# Patient Record
Sex: Female | Born: 1960 | Race: White | Hispanic: No | Smoking: Current every day smoker
Health system: Southern US, Community
[De-identification: ages and names within clinical notes are randomized; demographics above are authoritative.]

## PROBLEM LIST (undated history)

## (undated) DIAGNOSIS — J302 Other seasonal allergic rhinitis: Secondary | ICD-10-CM

## (undated) DIAGNOSIS — R112 Nausea with vomiting, unspecified: Secondary | ICD-10-CM

## (undated) DIAGNOSIS — M1990 Unspecified osteoarthritis, unspecified site: Secondary | ICD-10-CM

## (undated) DIAGNOSIS — M329 Systemic lupus erythematosus, unspecified: Secondary | ICD-10-CM

## (undated) DIAGNOSIS — IMO0002 Reserved for concepts with insufficient information to code with codable children: Secondary | ICD-10-CM

## (undated) DIAGNOSIS — K5792 Diverticulitis of intestine, part unspecified, without perforation or abscess without bleeding: Secondary | ICD-10-CM

## (undated) DIAGNOSIS — F410 Panic disorder [episodic paroxysmal anxiety] without agoraphobia: Secondary | ICD-10-CM

## (undated) DIAGNOSIS — K651 Peritoneal abscess: Secondary | ICD-10-CM

## (undated) DIAGNOSIS — Z9889 Other specified postprocedural states: Secondary | ICD-10-CM

## (undated) DIAGNOSIS — T7840XA Allergy, unspecified, initial encounter: Secondary | ICD-10-CM

## (undated) DIAGNOSIS — E785 Hyperlipidemia, unspecified: Secondary | ICD-10-CM

## (undated) DIAGNOSIS — I10 Essential (primary) hypertension: Secondary | ICD-10-CM

## (undated) HISTORY — PX: PATELLA FRACTURE SURGERY: SHX735

## (undated) HISTORY — PX: TRIGGER POINT INJECTION: SHX2580

## (undated) HISTORY — DX: Systemic lupus erythematosus, unspecified: M32.9

## (undated) HISTORY — DX: Unspecified osteoarthritis, unspecified site: M19.90

## (undated) HISTORY — PX: EYE SURGERY: SHX253

## (undated) HISTORY — DX: Hyperlipidemia, unspecified: E78.5

## (undated) HISTORY — DX: Panic disorder (episodic paroxysmal anxiety): F41.0

## (undated) HISTORY — DX: Other seasonal allergic rhinitis: J30.2

## (undated) HISTORY — PX: WRIST FRACTURE SURGERY: SHX121

## (undated) HISTORY — DX: Allergy, unspecified, initial encounter: T78.40XA

## (undated) HISTORY — DX: Reserved for concepts with insufficient information to code with codable children: IMO0002

## (undated) HISTORY — PX: TONSILLECTOMY: SUR1361

---

## 1981-09-18 HISTORY — PX: OTHER SURGICAL HISTORY: SHX169

## 1983-09-19 HISTORY — PX: TUBAL LIGATION: SHX77

## 2005-10-18 ENCOUNTER — Emergency Department (HOSPITAL_COMMUNITY): Admission: EM | Admit: 2005-10-18 | Discharge: 2005-10-18 | Payer: Self-pay | Admitting: Emergency Medicine

## 2010-05-04 ENCOUNTER — Emergency Department (HOSPITAL_COMMUNITY): Admission: EM | Admit: 2010-05-04 | Discharge: 2010-05-04 | Payer: Self-pay | Admitting: Emergency Medicine

## 2010-05-04 IMAGING — CR DG RIBS W/ CHEST 3+V*R*
4 series · 4 of 4 positions shown · non-contrast
Comparison: None

CLINICAL DATA: Fall, right lower lateral rib pain, cough and
congestion

RIGHT RIBS AND CHEST - 3+ VIEW

[w chest pa]
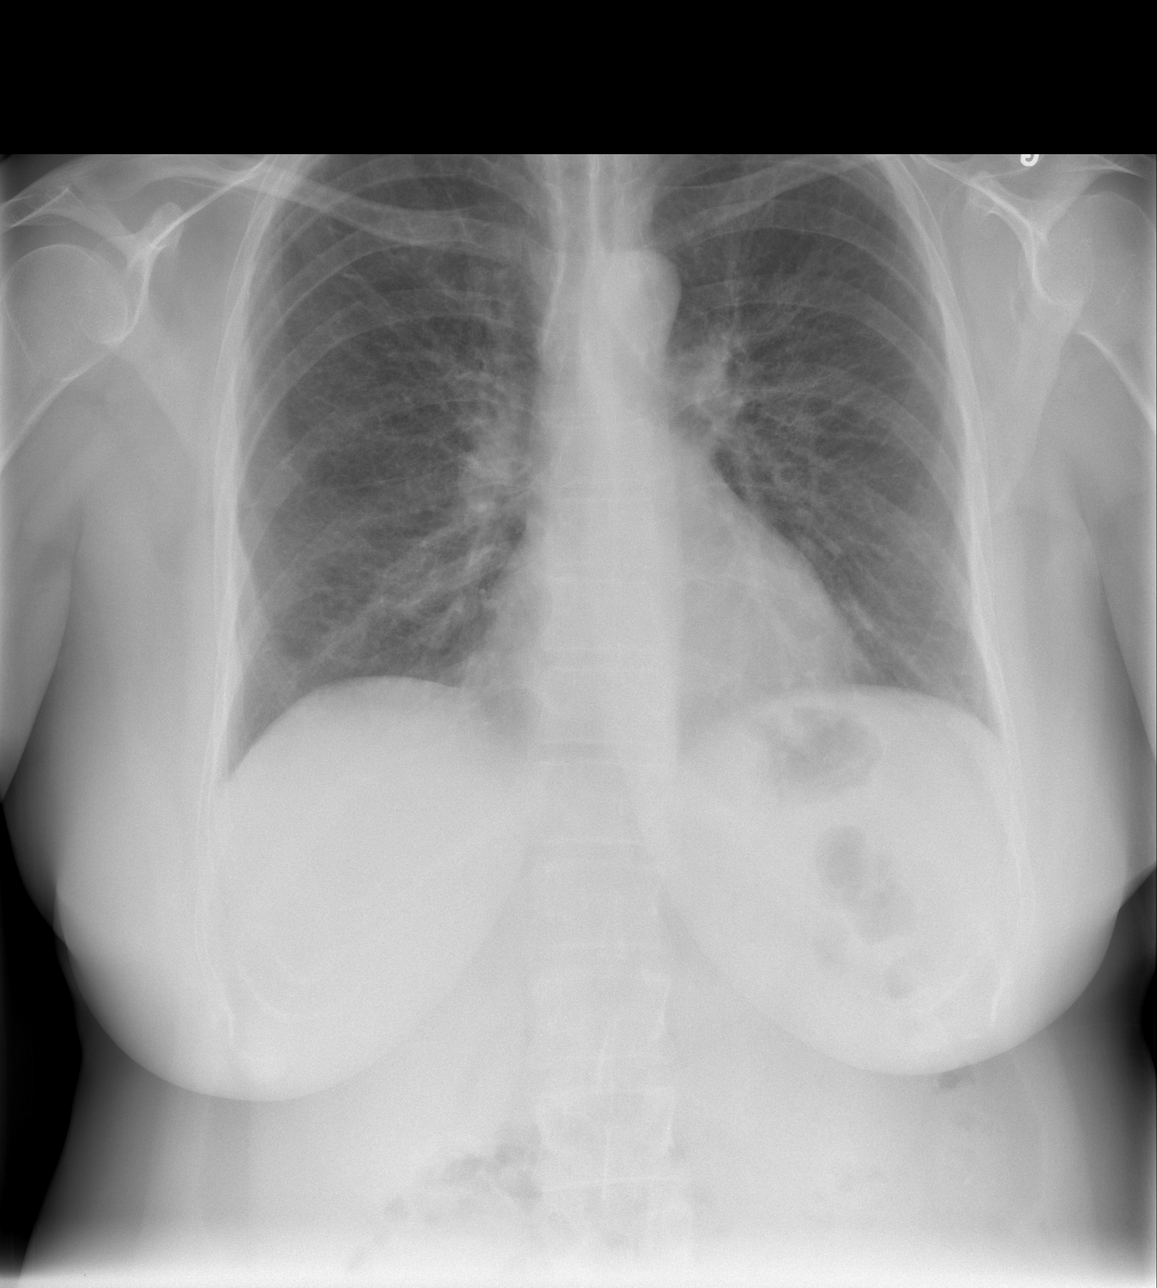

[w ribs ap/pa upper right *]
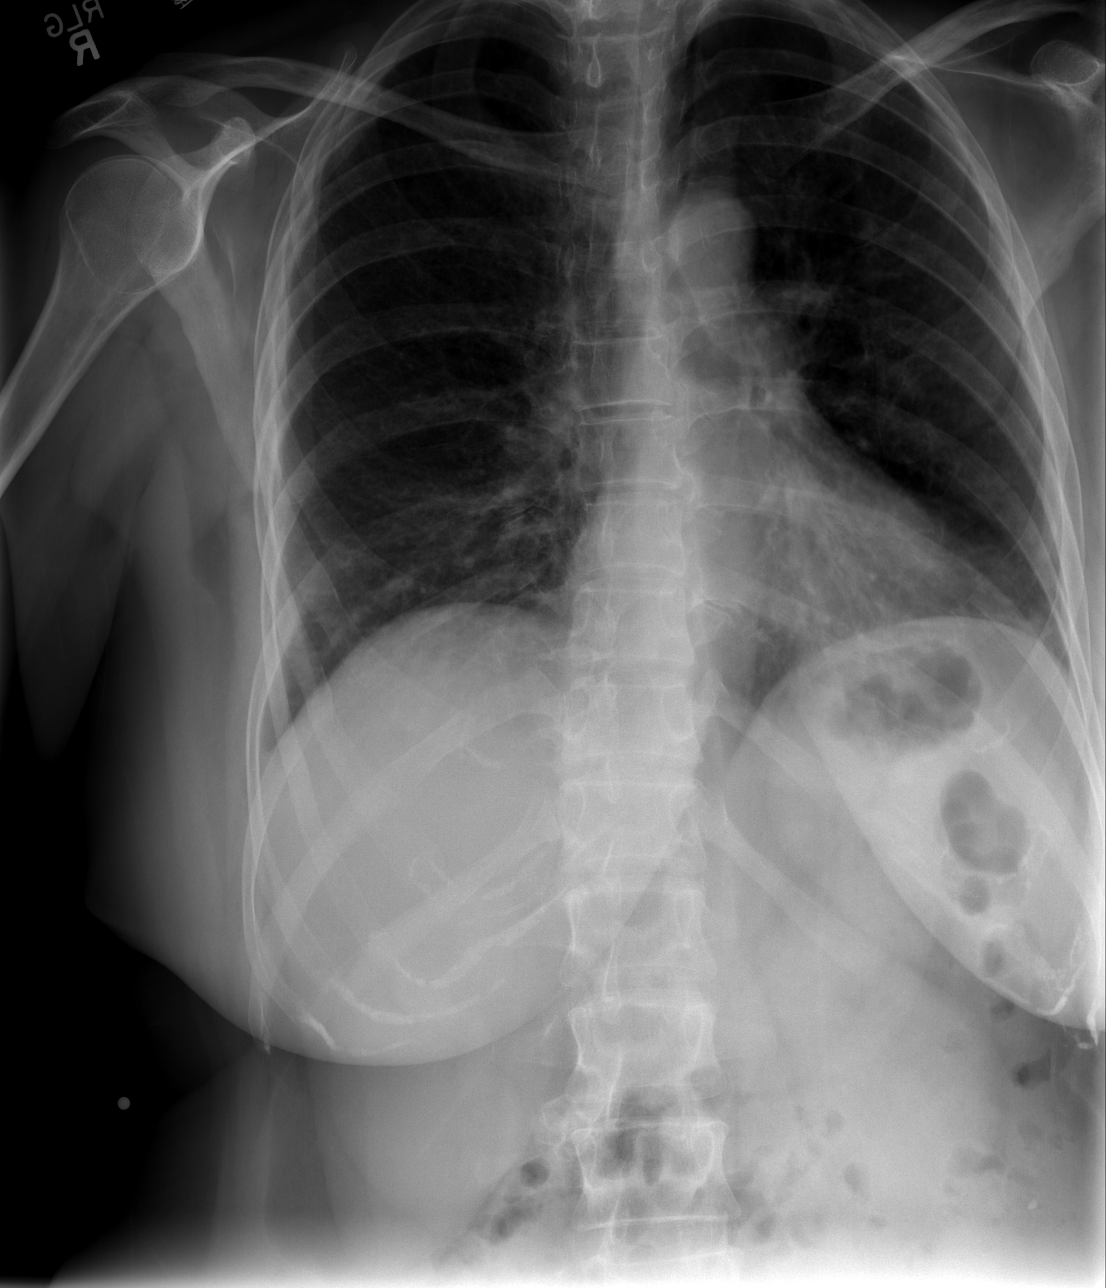

[w ribs ap/pa lower right * (1 of 2)]
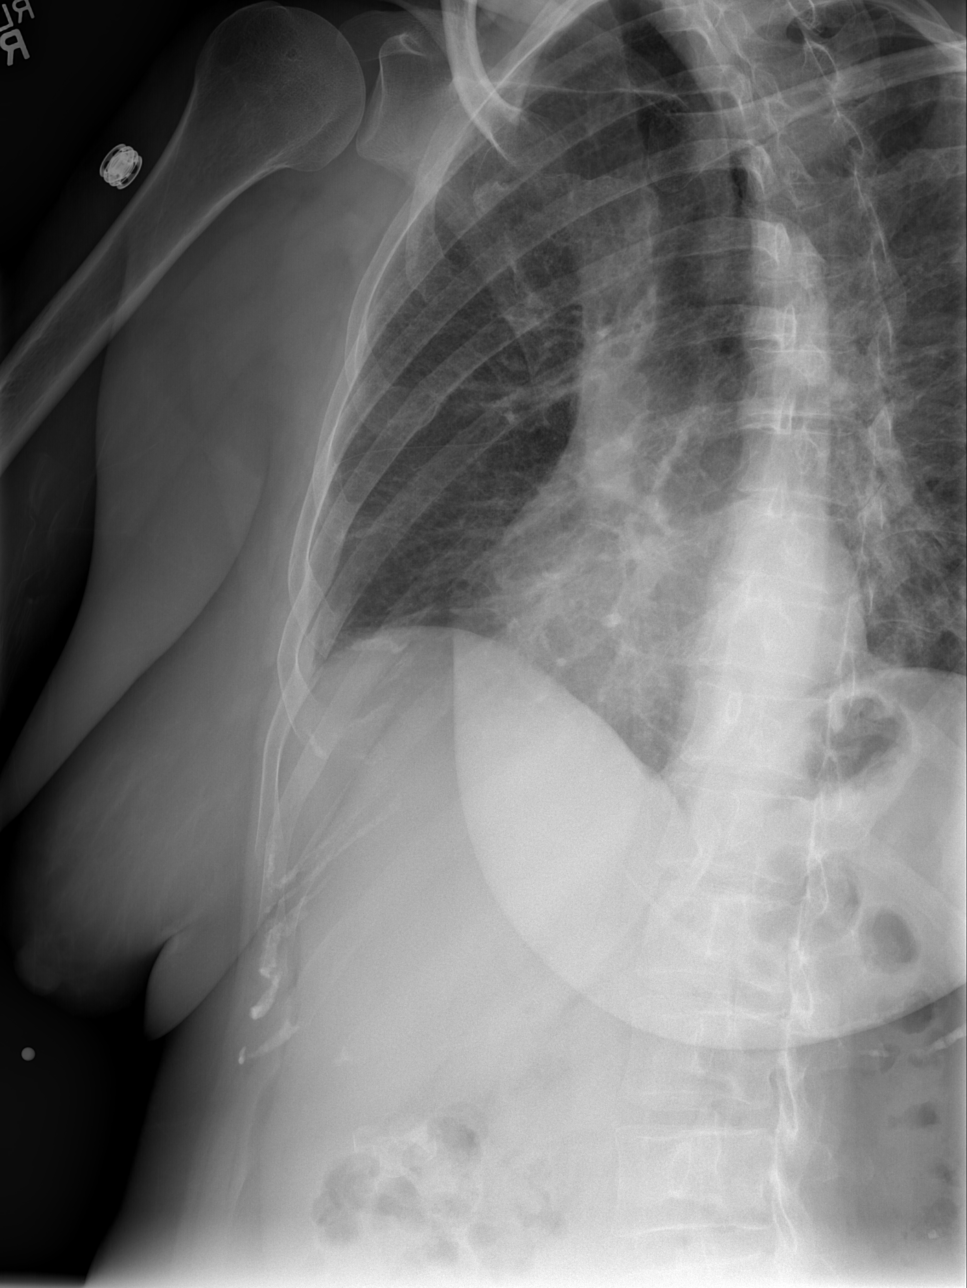

[w ribs ap/pa lower right * (2 of 2)]
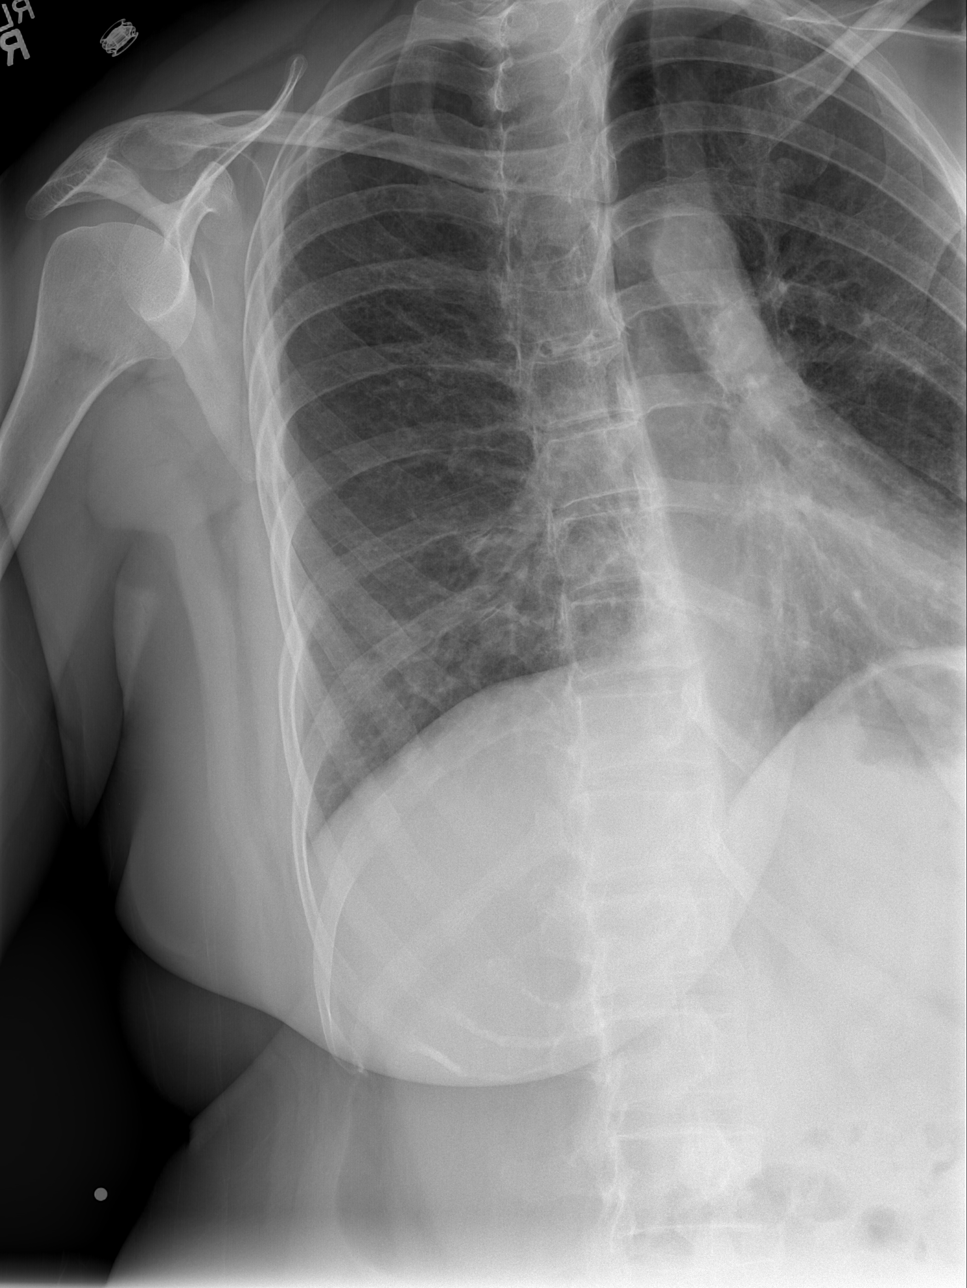

[4 of 4 positions shown; findings below may reference images not displayed]

FINDINGS: Normal heart size, mediastinal contours, and pulmonary vascularity.
Peribronchial thickening with slightly prominent interstitial
markings in mid lungs, uncertain acuity.
Minimal atelectasis right base.
Lungs otherwise clear.
No pleural effusion or pneumothorax.
Diffuse osseous demineralization.
Slight contour abnormality anterolateral right seventh rib, suspect
old fracture.
Old fracture anterolateral right sixth rib.
No definite acute rib fracture identified.
IMPRESSION: Bronchitic changes.
Minimal right basilar atelectasis.
Mild perihilar interstitial changes of uncertain acuity.
Bony demineralization with probably old fractures of anterolateral
right sixth and seventh ribs.
No definite acute abnormalities.

## 2011-08-30 DIAGNOSIS — J309 Allergic rhinitis, unspecified: Secondary | ICD-10-CM | POA: Insufficient documentation

## 2011-12-05 ENCOUNTER — Encounter: Payer: Self-pay | Admitting: Gastroenterology

## 2012-01-02 ENCOUNTER — Encounter: Payer: Self-pay | Admitting: Gastroenterology

## 2012-01-02 ENCOUNTER — Ambulatory Visit (AMBULATORY_SURGERY_CENTER): Payer: BC Managed Care – PPO | Admitting: *Deleted

## 2012-01-02 VITALS — Ht 66.0 in | Wt 173.8 lb

## 2012-01-02 DIAGNOSIS — Z1211 Encounter for screening for malignant neoplasm of colon: Secondary | ICD-10-CM

## 2012-01-02 MED ORDER — PEG-KCL-NACL-NASULF-NA ASC-C 100 G PO SOLR: ORAL | Status: DC

## 2012-01-16 ENCOUNTER — Ambulatory Visit (AMBULATORY_SURGERY_CENTER): Payer: BC Managed Care – PPO | Admitting: Gastroenterology

## 2012-01-16 ENCOUNTER — Encounter: Payer: Self-pay | Admitting: Gastroenterology

## 2012-01-16 VITALS — BP 107/72 | HR 80 | Temp 97.9°F | Resp 20 | Ht 63.0 in | Wt 173.0 lb

## 2012-01-16 DIAGNOSIS — D126 Benign neoplasm of colon, unspecified: Secondary | ICD-10-CM

## 2012-01-16 DIAGNOSIS — Z1211 Encounter for screening for malignant neoplasm of colon: Secondary | ICD-10-CM

## 2012-01-16 MED ORDER — SODIUM CHLORIDE 0.9 % IV SOLN: 500.0000 mL | INTRAVENOUS | Status: DC

## 2012-01-16 NOTE — Progress Notes (Signed)
Patient did not experience any of the following events: a burn prior to discharge; a fall within the facility; wrong site/side/patient/procedure/implant event; or a hospital transfer or hospital admission upon discharge from the facility. (G8907) Patient did not have preoperative order for IV antibiotic SSI prophylaxis. ([id number])  

## 2012-01-16 NOTE — Patient Instructions (Signed)

## 2012-01-16 NOTE — Op Note (Signed)
[facility] [address] [place], [place]  [address]  COLONOSCOPY PROCEDURE REPORT  PATIENT:  Brittany Hobbs, Brittany Hobbs  MR#:  [id number] BIRTHDATE:  07-Sep-1961, 51 yrs. old  GENDER:  female ENDOSCOPIST:  Judie Petit T. Russella Dar, MD, [facility] Referred by:  Zoe Lan, NP PROCEDURE DATE:  01/16/2012 PROCEDURE:  Colonoscopy with snare polypectomy ASA CLASS:  Class II INDICATIONS:  1) Routine Risk Screening MEDICATIONS:   MAC sedation, administered by CRNA, propofol (Diprivan) 350 mg IV DESCRIPTION OF PROCEDURE:   After the risks benefits and alternatives of the procedure were thoroughly explained, informed consent was obtained.  Digital rectal exam was performed and revealed no abnormalities.   The LB CF-[id number] endoscope was introduced through the anus and advanced to the cecum, which was identified by both the appendix and ileocecal valve, without limitations.  The quality of the prep was excellent, using MoviPrep.  The instrument was then slowly withdrawn as the colon was fully examined. <<PROCEDUREIMAGES>> FINDINGS:  A sessile polyp was found at the distal aspect of the hepatic flexure. It was 14 mm in size. Polyp was snared, then cauterized with monopolar cautery. Retrieval was successful. Piecemeal polypectomy.  A sessile polyp was found in the distal transverse colon. It was 12 mm in size. Polyp was snared, then cauterized with monopolar cautery. Retrieval was successful. A sessile polyp was found in the descending colon. It was 6 mm in size. Polyp was snared without cautery. Retrieval was successful. Moderate diverticulosis was found in the sigmoid colon.  Scattered diverticula were found in the transverse colon.  Otherwise normal colonoscopy without other polyps, masses, vascular ectasias, or inflammatory changes.   Retroflexed views in the rectum revealed no abnormalities.   The time to cecum =  4  minutes. The scope was then withdrawn (time =  12.25  min) from the  patient and the procedure completed.  COMPLICATIONS:  None  ENDOSCOPIC IMPRESSION: 1) 14 mm sessile polyp at the hepatic flexure 2) 12 mm sessile polyp in the distal transverse colon 3) 6 mm sessile polyp in the descending colon 4) Moderate diverticulosis in the sigmoid colon 5) Diverticula, scattered in the transverse colon  RECOMMENDATIONS: 1) Hold aspirin, aspirin products, and anti-inflammatory medication for 2 weeks. 2) Await pathology results 3) High fiber diet with liberal fluid intake. 4) Repeat Colonoscopy in 1 year if hepatic flexure polyp is adenomatous, 3 years if any polyps is adenomatous, otherwise 5 years.  Venita Lick. Russella Dar, MD, [facility]  n. eSIGNED:   Venita Lick. Cosette Prindle at 01/16/2012 10:05 AM  [place], [place], [phone]

## 2012-01-17 ENCOUNTER — Telehealth: Payer: Self-pay | Admitting: *Deleted

## 2012-01-17 ENCOUNTER — Telehealth: Payer: Self-pay | Admitting: Gastroenterology

## 2012-01-17 NOTE — Telephone Encounter (Signed)
No answer, left message to call office if questions or concerns. 

## 2012-01-17 NOTE — Telephone Encounter (Signed)
Patient complains of right lower abdominal pain , 4 out of 10, cramping. Says she thinks it is gas, because she hasn't passed very much since her procedure. Has had a normal colored, ribbon like stool today. No fever or other complaints. Advised her to try Gas x and laying down/position changes to get some relief. Patient also asked what she should take for her arthritis pain, suggested Tylenol Arthritis. Advised patient to call back in the next couple hours if abdominal pain is no better. Patient verbalized understanding.

## 2012-01-18 ENCOUNTER — Telehealth: Payer: Self-pay | Admitting: Gastroenterology

## 2012-01-18 MED ORDER — TRAMADOL HCL 50 MG PO TABS: 50.0000 mg | ORAL_TABLET | Freq: Four times a day (QID) | ORAL | Status: AC | PRN

## 2012-01-18 NOTE — Telephone Encounter (Signed)
Patient called to report that she has continued abdominal pain following her colonoscopy on 01/16/12.  The patient had a piece meal polypectomy and cautery .  She report Right sided abdominal pain and bloating.  She denies fever, nausea/vomiting, rectal bleeding, or other symptoms.  She reports she did have a small BM today and was "up walking a 40,000 sq foot showroom today" Hoping to "walk off the pain".  She reports the pain is a 5 or 6 on a scale of 1-10.  Discussed with Mike Gip PA.  The patient is to come in tomorrow at 8:30 for mult-iview abdominal x-ray and stat CBC. She is to remain on a liquid diet over night and if her symptoms worsen or she has new symptoms she should go to ER and advise them she had a colonoscopy with polypectomy.  I will also send her in ultram for the pain per Mike Gip PA .  She is aware of all the above and is agreeable to the plan.

## 2012-01-18 NOTE — Telephone Encounter (Signed)
I agree with plans. She should go to ED if any symptoms worsen tonight.

## 2012-01-19 ENCOUNTER — Ambulatory Visit: Payer: BC Managed Care – PPO | Admitting: Physician Assistant

## 2012-01-19 ENCOUNTER — Telehealth: Payer: Self-pay | Admitting: *Deleted

## 2012-01-19 NOTE — Telephone Encounter (Signed)
The patient had called one of our schedulers and cancelled her appt with Amy this AM.  Per Amy, she wanted me to call the pt and ask what her pain level was this AM. Mike Gip PA said that she may have had a burn happen when they did the polypectomy during the colonoscopy last week.  I told her this and advised we wanted to see her today if she was still in pain and treat her with antibiotics.  The pt said that she slept all night in a fetal position, slept well pain free. Today her pain level is at a "zero" per the patient. I told her to call us or the doctor on call this weekend if the pain returns and or she develops a fever.  She thanked me for calling her back

## 2012-01-22 ENCOUNTER — Encounter: Payer: Self-pay | Admitting: Gastroenterology

## 2012-02-01 NOTE — Telephone Encounter (Signed)
Nurse opened another phone note message answered 

## 2012-12-03 DIAGNOSIS — F32A Depression, unspecified: Secondary | ICD-10-CM | POA: Diagnosis present

## 2013-01-09 ENCOUNTER — Encounter: Payer: Self-pay | Admitting: Gastroenterology

## 2013-02-04 DIAGNOSIS — G4700 Insomnia, unspecified: Secondary | ICD-10-CM | POA: Insufficient documentation

## 2013-03-06 DIAGNOSIS — L930 Discoid lupus erythematosus: Secondary | ICD-10-CM

## 2014-11-10 ENCOUNTER — Encounter: Payer: Self-pay | Admitting: Gastroenterology

## 2015-05-20 DIAGNOSIS — M19072 Primary osteoarthritis, left ankle and foot: Secondary | ICD-10-CM | POA: Insufficient documentation

## 2015-09-15 ENCOUNTER — Encounter: Payer: Self-pay | Admitting: Gastroenterology

## 2015-10-26 ENCOUNTER — Ambulatory Visit: Payer: Self-pay | Admitting: *Deleted

## 2015-10-27 ENCOUNTER — Encounter: Payer: Self-pay | Admitting: Nurse Practitioner

## 2015-11-09 ENCOUNTER — Encounter: Payer: Self-pay | Admitting: Gastroenterology

## 2018-08-10 ENCOUNTER — Emergency Department (HOSPITAL_BASED_OUTPATIENT_CLINIC_OR_DEPARTMENT_OTHER): Payer: Self-pay

## 2018-08-10 ENCOUNTER — Inpatient Hospital Stay (HOSPITAL_BASED_OUTPATIENT_CLINIC_OR_DEPARTMENT_OTHER)
Admission: EM | Admit: 2018-08-10 | Discharge: 2018-08-14 | DRG: 392 | Disposition: A | Discharge status: Home / Self Care | Payer: Self-pay | Attending: Internal Medicine | Admitting: Internal Medicine

## 2018-08-10 ENCOUNTER — Other Ambulatory Visit: Payer: Self-pay

## 2018-08-10 ENCOUNTER — Encounter (HOSPITAL_BASED_OUTPATIENT_CLINIC_OR_DEPARTMENT_OTHER): Payer: Self-pay | Admitting: Emergency Medicine

## 2018-08-10 DIAGNOSIS — R8271 Bacteriuria: Secondary | ICD-10-CM

## 2018-08-10 DIAGNOSIS — L930 Discoid lupus erythematosus: Secondary | ICD-10-CM

## 2018-08-10 DIAGNOSIS — N1330 Unspecified hydronephrosis: Secondary | ICD-10-CM

## 2018-08-10 DIAGNOSIS — F17210 Nicotine dependence, cigarettes, uncomplicated: Secondary | ICD-10-CM | POA: Diagnosis present

## 2018-08-10 DIAGNOSIS — K5720 Diverticulitis of large intestine with perforation and abscess without bleeding: Principal | ICD-10-CM

## 2018-08-10 DIAGNOSIS — M5136 Other intervertebral disc degeneration, lumbar region: Secondary | ICD-10-CM | POA: Diagnosis present

## 2018-08-10 DIAGNOSIS — E785 Hyperlipidemia, unspecified: Secondary | ICD-10-CM

## 2018-08-10 DIAGNOSIS — E876 Hypokalemia: Secondary | ICD-10-CM | POA: Diagnosis present

## 2018-08-10 DIAGNOSIS — F419 Anxiety disorder, unspecified: Secondary | ICD-10-CM

## 2018-08-10 DIAGNOSIS — Z79899 Other long term (current) drug therapy: Secondary | ICD-10-CM

## 2018-08-10 DIAGNOSIS — M329 Systemic lupus erythematosus, unspecified: Secondary | ICD-10-CM

## 2018-08-10 DIAGNOSIS — I700 Atherosclerosis of aorta: Secondary | ICD-10-CM | POA: Diagnosis present

## 2018-08-10 DIAGNOSIS — I10 Essential (primary) hypertension: Secondary | ICD-10-CM

## 2018-08-10 DIAGNOSIS — Z791 Long term (current) use of non-steroidal anti-inflammatories (NSAID): Secondary | ICD-10-CM

## 2018-08-10 DIAGNOSIS — K5792 Diverticulitis of intestine, part unspecified, without perforation or abscess without bleeding: Secondary | ICD-10-CM | POA: Insufficient documentation

## 2018-08-10 DIAGNOSIS — K8020 Calculus of gallbladder without cholecystitis without obstruction: Secondary | ICD-10-CM | POA: Diagnosis present

## 2018-08-10 DIAGNOSIS — Z91041 Radiographic dye allergy status: Secondary | ICD-10-CM

## 2018-08-10 DIAGNOSIS — Z79891 Long term (current) use of opiate analgesic: Secondary | ICD-10-CM

## 2018-08-10 DIAGNOSIS — F410 Panic disorder [episodic paroxysmal anxiety] without agoraphobia: Secondary | ICD-10-CM | POA: Diagnosis present

## 2018-08-10 DIAGNOSIS — K5732 Diverticulitis of large intestine without perforation or abscess without bleeding: Secondary | ICD-10-CM

## 2018-08-10 DIAGNOSIS — L0291 Cutaneous abscess, unspecified: Secondary | ICD-10-CM

## 2018-08-10 DIAGNOSIS — J45909 Unspecified asthma, uncomplicated: Secondary | ICD-10-CM | POA: Diagnosis present

## 2018-08-10 HISTORY — DX: Diverticulitis of large intestine with perforation and abscess without bleeding: K57.20

## 2018-08-10 LAB — CBC
HCT: 47.6 % — ABNORMAL HIGH (ref 36.0–46.0)
HCT: 45.8 % (ref 36.0–46.0)
HEMOGLOBIN: 15.1 g/dL — AB (ref 12.0–15.0)
HEMOGLOBIN: 14.6 g/dL (ref 12.0–15.0)
MCH: 26.8 pg (ref 26.0–34.0)
MCH: 27.4 pg (ref 26.0–34.0)
MCHC: 31.7 g/dL (ref 30.0–36.0)
MCHC: 31.9 g/dL (ref 30.0–36.0)
MCV: 84.4 fL (ref 80.0–100.0)
MCV: 85.9 fL (ref 80.0–100.0)
PLATELETS: 385 10*3/uL (ref 150–400)
PLATELETS: 351 10*3/uL (ref 150–400)
RBC: 5.64 MIL/uL — AB (ref 3.87–5.11)
RBC: 5.33 MIL/uL — ABNORMAL HIGH (ref 3.87–5.11)
RDW: 15.1 % (ref 11.5–15.5)
RDW: 15 % (ref 11.5–15.5)
WBC: 8.4 10*3/uL (ref 4.0–10.5)
WBC: 10.7 10*3/uL — AB (ref 4.0–10.5)
nRBC: 0 % (ref 0.0–0.2)
nRBC: 0 % (ref 0.0–0.2)

## 2018-08-10 LAB — COMPREHENSIVE METABOLIC PANEL
ALK PHOS: 79 U/L (ref 38–126)
ALT: 14 U/L (ref 0–44)
ANION GAP: 11 (ref 5–15)
AST: 20 U/L (ref 15–41)
Albumin: 3.6 g/dL (ref 3.5–5.0)
BILIRUBIN TOTAL: 0.5 mg/dL (ref 0.3–1.2)
BUN: 22 mg/dL — ABNORMAL HIGH (ref 6–20)
CALCIUM: 9.4 mg/dL (ref 8.9–10.3)
CO2: 27 mmol/L (ref 22–32)
Chloride: 102 mmol/L (ref 98–111)
Creatinine, Ser: 0.9 mg/dL (ref 0.44–1.00)
GFR calc non Af Amer: >60 mL/min (ref >60)
GLUCOSE: 109 mg/dL — AB (ref 70–99)
POTASSIUM: 3.3 mmol/L — AB (ref 3.5–5.1)
Sodium: 140 mmol/L (ref 135–145)
TOTAL PROTEIN: 8.1 g/dL (ref 6.5–8.1)

## 2018-08-10 LAB — URINALYSIS, MICROSCOPIC (REFLEX)

## 2018-08-10 LAB — URINALYSIS, ROUTINE W REFLEX MICROSCOPIC
Glucose, UA: NEGATIVE mg/dL
Ketones, ur: NEGATIVE mg/dL
NITRITE: NEGATIVE
PROTEIN: NEGATIVE mg/dL
pH: 5 (ref 5.0–8.0)

## 2018-08-10 LAB — BASIC METABOLIC PANEL
ANION GAP: 7 (ref 5–15)
BUN: 20 mg/dL (ref 6–20)
CHLORIDE: 105 mmol/L (ref 98–111)
CO2: 28 mmol/L (ref 22–32)
Calcium: 9.2 mg/dL (ref 8.9–10.3)
Creatinine, Ser: 0.77 mg/dL (ref 0.44–1.00)
GFR calc Af Amer: >60 mL/min (ref >60)
GFR calc non Af Amer: >60 mL/min (ref >60)
GLUCOSE: 105 mg/dL — AB (ref 70–99)
POTASSIUM: 3 mmol/L — AB (ref 3.5–5.1)
SODIUM: 140 mmol/L (ref 135–145)

## 2018-08-10 LAB — LIPASE, BLOOD: Lipase: 27 U/L (ref 11–51)

## 2018-08-10 LAB — MAGNESIUM: Magnesium: 2.1 mg/dL (ref 1.7–2.4)

## 2018-08-10 IMAGING — CT CT ABD-PELV W/O CM
2 of 4 series · 16 of 46 positions shown, 18 images · non-contrast
Comparison: None.

CLINICAL DATA: 57-year-old female with acute LEFT abdominal and
pelvic pain. History of lupus.

EXAM:
CT ABDOMEN AND PELVIS WITHOUT CONTRAST
TECHNIQUE: Multidetector CT imaging of the abdomen and pelvis was performed
following the standard protocol without IV contrast, due to allergy.

[Series 2: axial st · axial · 0.74mm/px · z∈[-698,-314]mm · 13 of 85 slices shown, 15 images]
[im 4/85  soft-tissue]
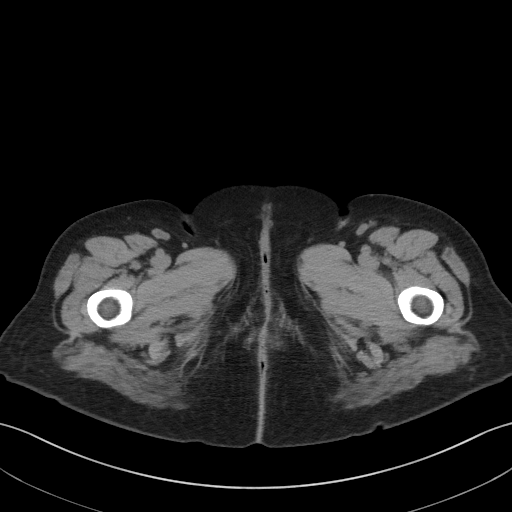
[im 4/85  bone]
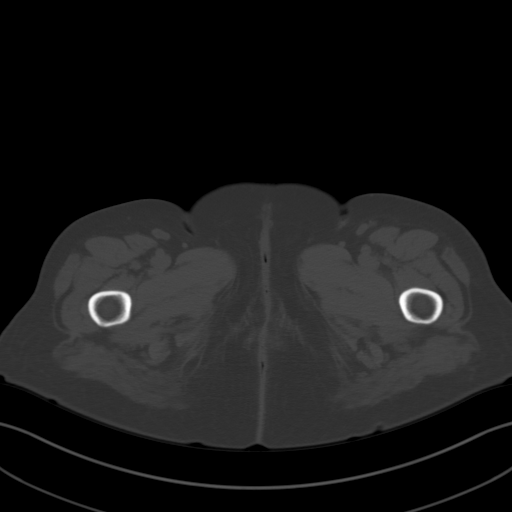
[im 11/85  soft-tissue]
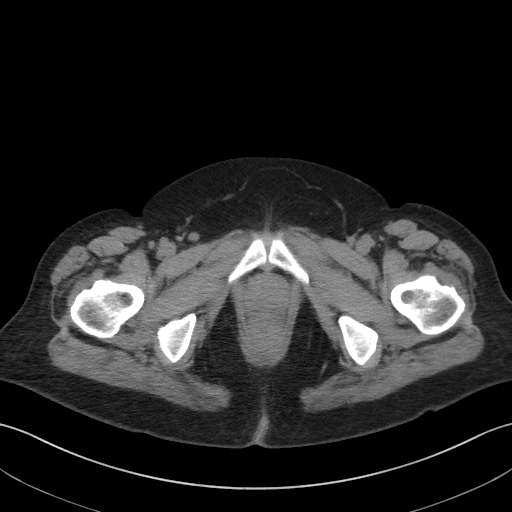
[im 17/85  soft-tissue]
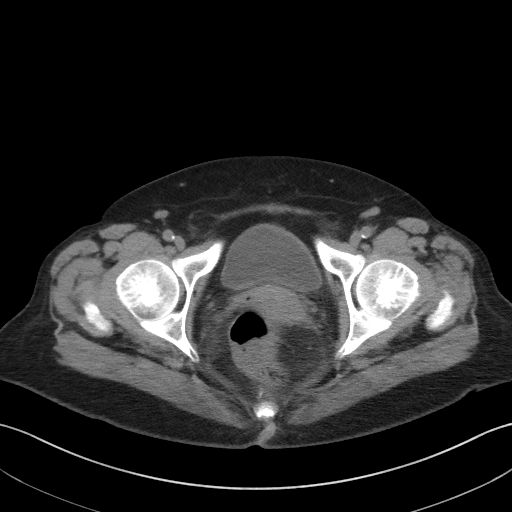
[im 24/85  soft-tissue]
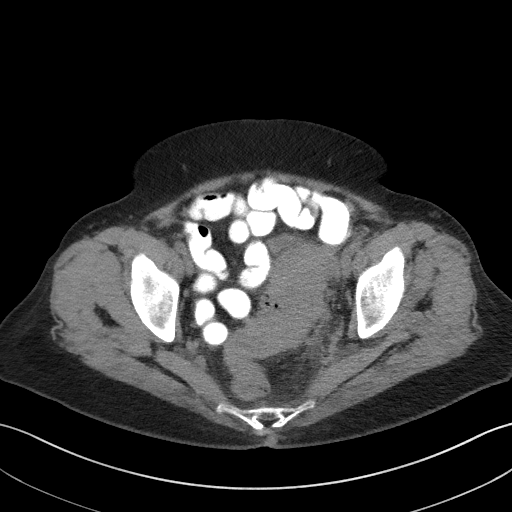
[im 31/85  soft-tissue]
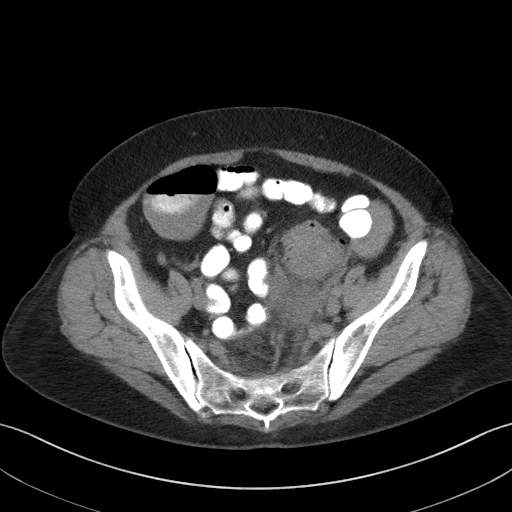
[im 37/85  soft-tissue]
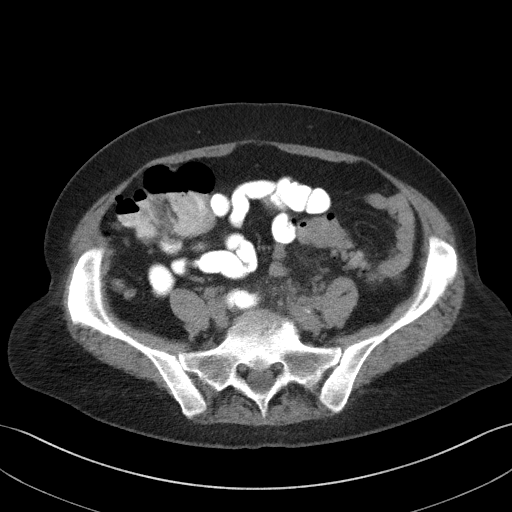
[im 44/85  soft-tissue]
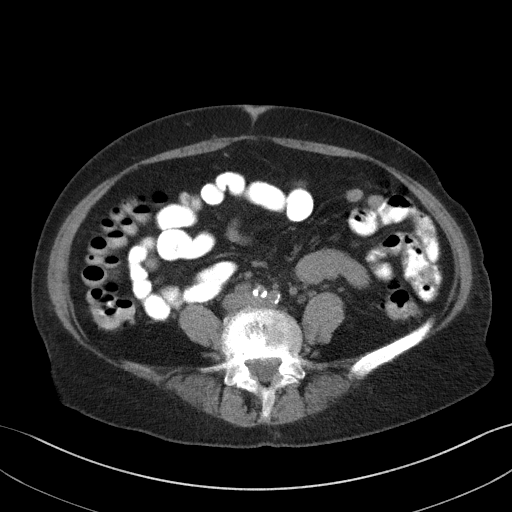
[im 48/85  soft-tissue]
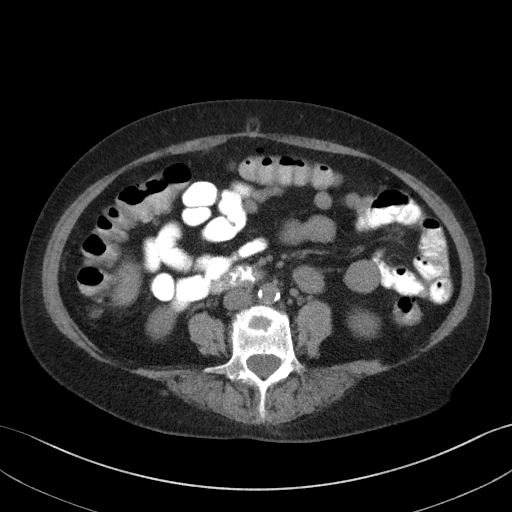
[im 54/85  soft-tissue]
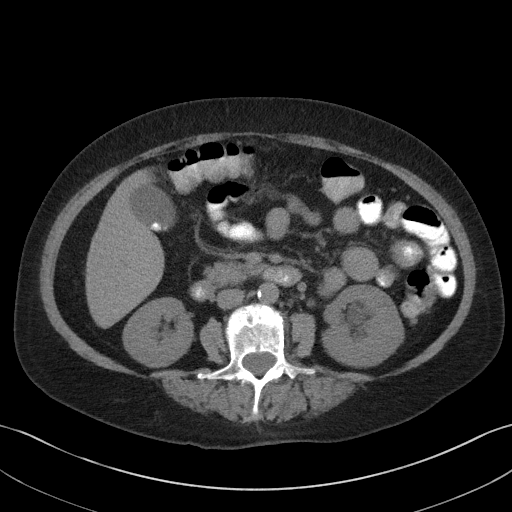
[im 54/85  bone]
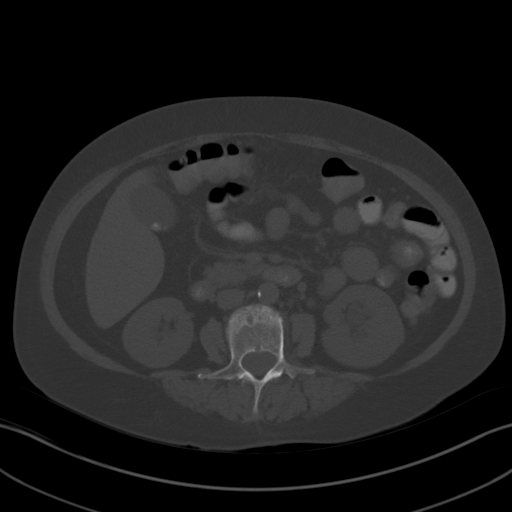
[im 61/85  soft-tissue]
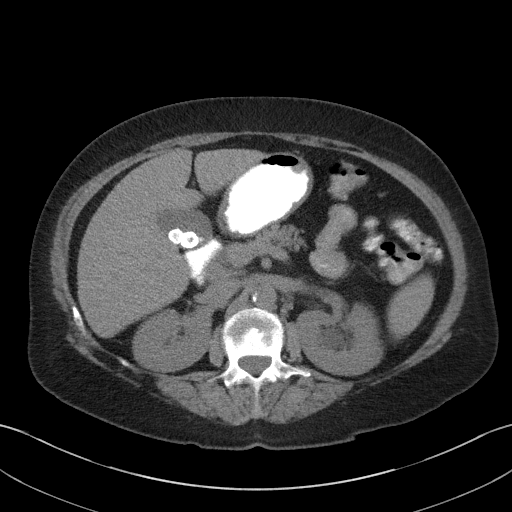
[im 68/85  soft-tissue]
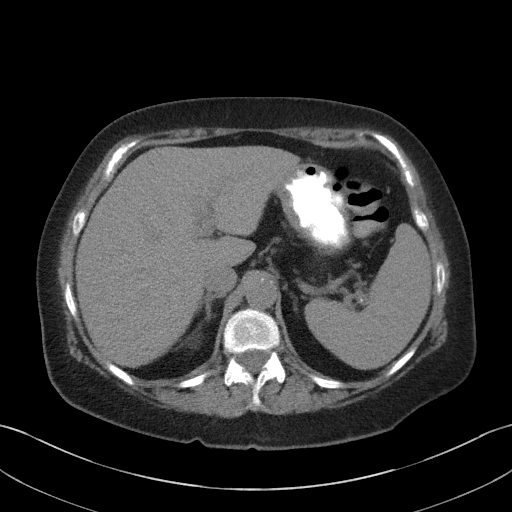
[im 74/85  soft-tissue]
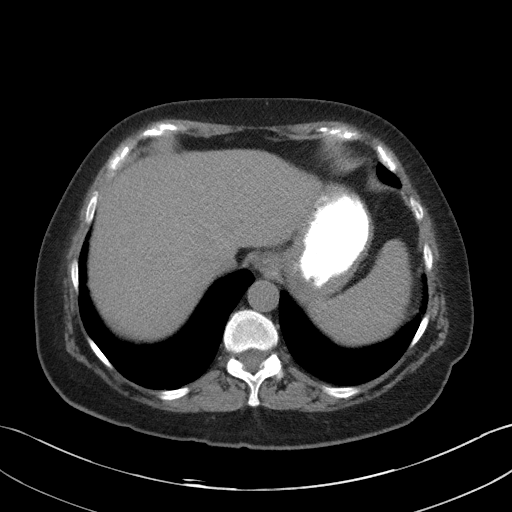
[im 81/85  soft-tissue]
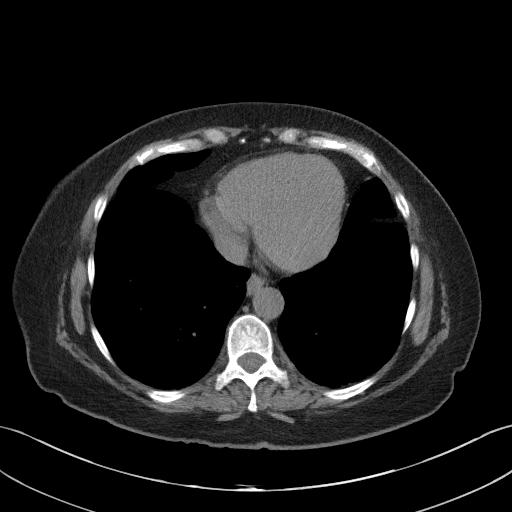

[Series 5: coronal st · coronal · 0.75mm/px · 3 of 85 slices shown]
[im 29/85  soft-tissue]
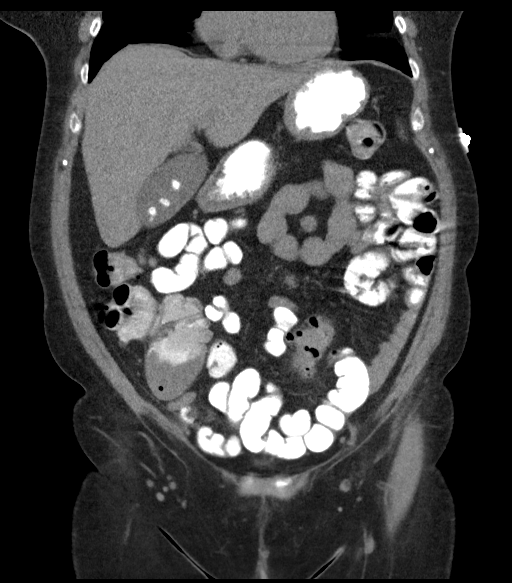
[im 38/85  soft-tissue]
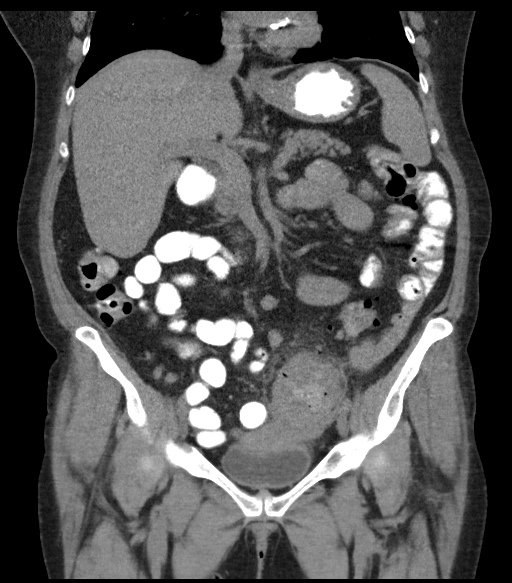
[im 47/85  soft-tissue]
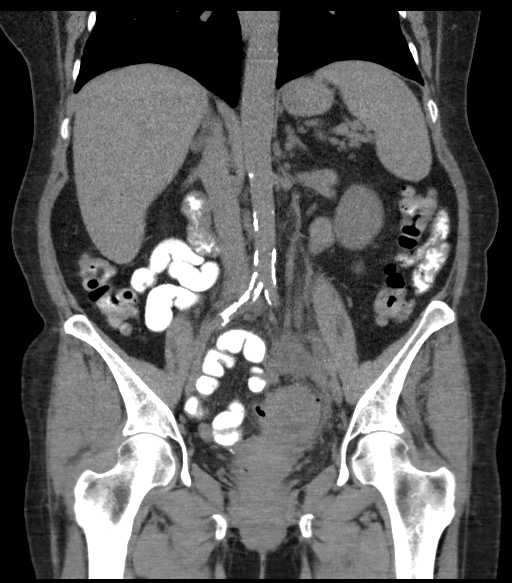

[16 of 46 positions shown; findings below may reference images not displayed]

FINDINGS: Please note that parenchymal abnormalities may be missed without
intravenous contrast.

Lower chest: No acute abnormality. No pulmonary nodules identified
in the lung bases.

Hepatobiliary: The liver is unremarkable. Multiple gallstones are
present without CT evidence of acute cholecystitis or biliary
dilatation.

Pancreas: Unremarkable

Spleen: Unremarkable

Adrenals/Urinary Tract: Moderate LEFT hydroureteronephrosis is
caused by a ill-defined LEFT pelvic mass which will be discussed.
The RIGHT kidney, adrenal glands and bladder are unremarkable.

Stomach/Bowel: Diffuse wall thickening of the proximal/mid sigmoid
colon noted with adjacent stranding.

A 3 x 4.5 cm ill-defined soft tissue structure in the LEFT iliac
region noted (series 2: Image 53), causing LEFT hydronephrosis. This
soft tissue structure lies close to but not immediately adjacent to
the thick-walled sigmoid colon.

No bowel obstruction, pneumoperitoneum or other definite collection.
The appendix is normal. Extensive sigmoid colonic diverticula noted.

Vascular/Lymphatic: Aortic atherosclerosis. No enlarged abdominal or
pelvic lymph nodes.

Reproductive: Uterus and bilateral adnexa are unremarkable.

Other: No ascites.

Musculoskeletal: No acute or suspicious bony abnormalities
identified. Mild compression/Schmorl's nodes within the SUPERIOR
endplates of L2 through L4 noted. Moderate degenerative disc disease
at L4-5 and L5-S1 noted. No suspicious focal bony lesions
identified.
IMPRESSION: 1. Diffuse wall thickening of the proximal-mid sigmoid colon with
adjacent stranding and with 3 x 4.5 cm ill-defined LEFT pelvic soft
tissue structure. These findings are nonspecific but may represent
acute diverticulitis with inflammatory/infectious LEFT pelvic
process. Colonic neoplasm with LEFT pelvic adenopathy is not
excluded however and clinical correlation and direct inspection
follow-up recommended.
2. Moderate LEFT hydroureteronephrosis caused by the 3 x 4.5 cm
ill-defined LEFT pelvic soft tissue structure.
3. Cholelithiasis without CT evidence of acute cholecystitis
4.  Aortic Atherosclerosis ([LN]-[LN]).

## 2018-08-10 MED ORDER — PIPERACILLIN-TAZOBACTAM 3.375 G IVPB 30 MIN
3.3750 g | Freq: Once | INTRAVENOUS | Status: AC
  Administered 2018-08-10: 3.375 g via INTRAVENOUS
  Filled 2018-08-10 (×2): qty 50

## 2018-08-10 MED ORDER — ONDANSETRON HCL 4 MG PO TABS: 4.0000 mg | ORAL_TABLET | Freq: Four times a day (QID) | ORAL | Status: DC | PRN

## 2018-08-10 MED ORDER — POLYETHYLENE GLYCOL 3350 17 G PO PACK: 17.0000 g | PACK | Freq: Every day | ORAL | Status: DC | PRN

## 2018-08-10 MED ORDER — HYDROCODONE-ACETAMINOPHEN 5-325 MG PO TABS
1.0000 | ORAL_TABLET | ORAL | Status: DC | PRN
  Administered 2018-08-10 – 2018-08-11 (×2): 2 via ORAL
  Administered 2018-08-11: 1 via ORAL
  Administered 2018-08-11 – 2018-08-14 (×10): 2 via ORAL
  Filled 2018-08-10 (×12): qty 2
  Filled 2018-08-10: qty 1

## 2018-08-10 MED ORDER — MORPHINE SULFATE (PF) 4 MG/ML IV SOLN
6.0000 mg | Freq: Once | INTRAVENOUS | Status: AC
  Administered 2018-08-10: 6 mg via INTRAVENOUS
  Filled 2018-08-10: qty 2

## 2018-08-10 MED ORDER — PIPERACILLIN-TAZOBACTAM 3.375 G IVPB
3.3750 g | Freq: Three times a day (TID) | INTRAVENOUS | Status: DC
  Administered 2018-08-10 – 2018-08-14 (×11): 3.375 g via INTRAVENOUS
  Filled 2018-08-10 (×12): qty 50

## 2018-08-10 MED ORDER — ACETAMINOPHEN 650 MG RE SUPP: 650.0000 mg | Freq: Four times a day (QID) | RECTAL | Status: DC | PRN

## 2018-08-10 MED ORDER — ACETAMINOPHEN 325 MG PO TABS: 650.0000 mg | ORAL_TABLET | Freq: Four times a day (QID) | ORAL | Status: DC | PRN

## 2018-08-10 MED ORDER — KETOROLAC TROMETHAMINE 15 MG/ML IJ SOLN
15.0000 mg | Freq: Four times a day (QID) | INTRAMUSCULAR | Status: DC | PRN
  Administered 2018-08-11 – 2018-08-13 (×5): 15 mg via INTRAVENOUS
  Filled 2018-08-10 (×5): qty 1

## 2018-08-10 MED ORDER — SODIUM CHLORIDE 0.9 % IV BOLUS
1000.0000 mL | Freq: Once | INTRAVENOUS | Status: AC
  Administered 2018-08-10: 1000 mL via INTRAVENOUS

## 2018-08-10 MED ORDER — POTASSIUM CHLORIDE IN NACL 20-0.9 MEQ/L-% IV SOLN
INTRAVENOUS | Status: DC
  Administered 2018-08-10: 23:00:00 via INTRAVENOUS
  Filled 2018-08-10 (×2): qty 1000

## 2018-08-10 MED ORDER — SODIUM CHLORIDE 0.9 % IV SOLN
INTRAVENOUS | Status: DC | PRN
  Administered 2018-08-10: 250 mL via INTRAVENOUS

## 2018-08-10 MED ORDER — ONDANSETRON HCL 4 MG/2ML IJ SOLN
4.0000 mg | Freq: Four times a day (QID) | INTRAMUSCULAR | Status: DC | PRN
  Administered 2018-08-11 (×3): 4 mg via INTRAVENOUS
  Filled 2018-08-10 (×3): qty 2

## 2018-08-10 NOTE — H&P (Signed)
History and Physical    Brittany Hobbs BVQ:[id number] DOB: August 08, 1961 DOA: 08/10/2018  PCP: Berkley Harvey, NP   Patient coming from: Home  I have personally briefly reviewed patient's old medical records in [place]  Chief Complaint: Abdominal pain  HPI: Brittany Hobbs is a 57 y.o. female with medical history significant of lupus and anxiety presents with left lower quadrant pain.  Patient has had 2 bouts of diverticulitis over the past 2 months.  She was treated with p.o. Cipro and Flagyl per PCP x2.  Past few days has had increasing left lower quadrant pain and anorexia.  She had IV contrast dye allergy so was given p.o. contrast of the CT scan of her abdomen pelvis showed changes suggestive of a abscess.  This area was also causing left hydroureteronephrosis.  Patient was transferred here from [facility].  Per documentation Dr. Lucia Gaskins general surgery recommended IR for drainage.  Patient was hemodynamically stable here.  She is afebrile here.  No leukocytosis.  ED Course: She was given IV Zosyn and transferred to [facility] for further treatment.  Review of Systems: + abd pain , anorexia All others reviewed with patient  and are  negative unless otherwise stated   Past Medical History:  Diagnosis Date  . Arthritis   . Asthma   . Hyperlipidemia   . Lupus ([place])   . Panic attacks   . Seasonal allergies     Past Surgical History:  Procedure Laterality Date  . scar tissue eye  1983   right  . TUBAL LIGATION  1985     reports that she has been smoking cigarettes. She has been smoking about 0.30 packs per day. She has never used smokeless tobacco. She reports that she does not drink alcohol or use drugs.  Allergies  Allergen Reactions  . Iodine Anaphylaxis    Pt states she is allergic to INTERNAL IODINE  . Betadine [Povidone Iodine] Hives  . Sulfa Antibiotics     N/V    Family History  Problem Relation Age of Onset  . Colon cancer Neg Hx     . Stomach cancer Neg Hx     Prior to Admission medications   Medication Sig Start Date End Date Taking? Authorizing Provider  aspirin EC 81 MG tablet Take 81 mg by mouth daily.   Yes [provider]  diphenhydrAMINE (BENADRYL) 50 MG tablet Take 50 mg by mouth at bedtime.    Yes [provider]  escitalopram (LEXAPRO) 20 MG tablet Take 20 mg by mouth daily.   Yes [provider]  fluticasone (FLONASE) 50 MCG/ACT nasal spray Place 1 spray into both nostrils as needed for allergies.  10/29/11  Yes [provider]  gabapentin (NEURONTIN) 400 MG capsule Take 800 mg by mouth 3 (three) times daily.   Yes [provider]  HYDROcodone-acetaminophen (NORCO/VICODIN) 5-325 MG tablet Take 1 tablet by mouth every 6 (six) hours as needed for moderate pain.   Yes [provider]  LORazepam (ATIVAN) 1 MG tablet Take 1 mg by mouth 3 (three) times daily as needed for sleep.    Yes [provider]  Probiotic Product (SOLUBLE FIBER/PROBIOTICS PO) Take by mouth daily.   Yes [provider]    Physical Exam: Vitals:   08/10/18 1615 08/10/18 1900 08/10/18 2005 08/10/18 2102  BP: 110/62 118/69 (!) 153/75 (!) 145/86  Pulse: 67 64 69 71  Resp: 18 20 20  19  Temp:    98.3 F (36.8 C)  TempSrc:    Oral  SpO2: 96% 94% 94% 91%  Weight:      Height:        Constitutional: NAD, calm, comfortable Vitals:   08/10/18 1615 08/10/18 1900 08/10/18 2005 08/10/18 2102  BP: 110/62 118/69 (!) 153/75 (!) 145/86  Pulse: 67 64 69 71  Resp: 18 20 20 19   Temp:    98.3 F (36.8 C)  TempSrc:    Oral  SpO2: 96% 94% 94% 91%  Weight:      Height:       Eyes: PERRL, lids and conjunctivae normal ENMT: Mucous membranes are moist. + dentures Posterior pharynx clear of any exudate or lesions. Neck: normal, supple,  Respiratory: clear to auscultation bilaterally, no wheezing, no crackles. Normal respiratory effort. No accessory muscle use.  Cardiovascular:  Regular rate and rhythm,  No extremity edema. 2+ pedal pulses.  Abdomen: LLQ  tenderness, no masses palpated. No rebound  Bowel sounds positive.  Musculoskeletal: no clubbing / cyanosis. No joint deformity upper and lower extremities. Good ROM, no contractures. Normal muscle tone.  Skin: no rashes, lesions, ulcers. No induration Neurologic: CN 2-12 grossly intact. . Strength 5/5 in all 4.  Psychiatric: Normal judgment and insight. Alert and oriented x 3. Normal mood.     Labs on Admission: I have personally reviewed following labs and imaging studies  CBC: Recent Labs  Lab 08/10/18 1036 08/10/18 2226  WBC 8.4 10.7*  HGB 15.1* 14.6  HCT 47.6* 45.8  MCV 84.4 85.9  PLT 385 767   Basic Metabolic Panel: Recent Labs  Lab 08/10/18 1036 08/10/18 2226  NA 140 140  K 3.3* 3.0*  CL 102 105  CO2 27 28  GLUCOSE 109* 105*  BUN 22* 20  CREATININE 0.90 0.77  CALCIUM 9.4 9.2  MG  --  2.1   GFR: Estimated Creatinine Clearance: 73.5 mL/min (by C-G formula based on SCr of 0.77 mg/dL). Liver Function Tests: Recent Labs  Lab 08/10/18 1036  AST 20  ALT 14  ALKPHOS 79  BILITOT 0.5  PROT 8.1  ALBUMIN 3.6   Recent Labs  Lab 08/10/18 1036  LIPASE 27   No results for input(s): AMMONIA in the last 168 hours. Coagulation Profile: No results for input(s): INR, PROTIME in the last 168 hours. Cardiac Enzymes: No results for input(s): CKTOTAL, CKMB, CKMBINDEX, TROPONINI in the last 168 hours. BNP (last 3 results) No results for input(s): PROBNP in the last 8760 hours. HbA1C: No results for input(s): HGBA1C in the last 72 hours. CBG: No results for input(s): GLUCAP in the last 168 hours. Lipid Profile: No results for input(s): CHOL, HDL, LDLCALC, TRIG, CHOLHDL, LDLDIRECT in the last 72 hours. Thyroid Function Tests: No results for input(s): TSH, T4TOTAL, FREET4, T3FREE, THYROIDAB in the last 72 hours. Anemia Panel: No results for input(s): VITAMINB12, FOLATE, FERRITIN, TIBC, IRON,  RETICCTPCT in the last 72 hours. Urine analysis:    Component Value Date/Time   COLORURINE YELLOW 08/10/2018 1230   APPEARANCEUR CLEAR 08/10/2018 1230   LABSPEC >1.030 (H) 08/10/2018 1230   PHURINE 5.0 08/10/2018 1230   GLUCOSEU NEGATIVE 08/10/2018 1230   HGBUR LARGE (A) 08/10/2018 1230   BILIRUBINUR SMALL (A) 08/10/2018 1230   KETONESUR NEGATIVE 08/10/2018 1230   PROTEINUR NEGATIVE 08/10/2018 1230   NITRITE NEGATIVE 08/10/2018 1230   LEUKOCYTESUR TRACE (A) 08/10/2018 1230    Radiological Exams on Admission: Ct Abdomen Pelvis Wo Contrast  Result Date: 08/10/2018  CLINICAL DATA:  57 year old female with acute LEFT abdominal and pelvic pain. History of lupus. EXAM: CT ABDOMEN AND PELVIS WITHOUT CONTRAST TECHNIQUE: Multidetector CT imaging of the abdomen and pelvis was performed following the standard protocol without IV contrast, due to allergy. COMPARISON:  None. FINDINGS: Please note that parenchymal abnormalities may be missed without intravenous contrast. Lower chest: No acute abnormality. No pulmonary nodules identified in the lung bases. Hepatobiliary: The liver is unremarkable. Multiple gallstones are present without CT evidence of acute cholecystitis or biliary dilatation. Pancreas: Unremarkable Spleen: Unremarkable Adrenals/Urinary Tract: Moderate LEFT hydroureteronephrosis is caused by a ill-defined LEFT pelvic mass which will be discussed. The RIGHT kidney, adrenal glands and bladder are unremarkable. Stomach/Bowel: Diffuse wall thickening of the proximal/mid sigmoid colon noted with adjacent stranding. A 3 x 4.5 cm ill-defined soft tissue structure in the LEFT iliac region noted (series 2: Image 53), causing LEFT hydronephrosis. This soft tissue structure lies close to but not immediately adjacent to the thick-walled sigmoid colon. No bowel obstruction, pneumoperitoneum or other definite collection. The appendix is normal. Extensive sigmoid colonic diverticula noted. Vascular/Lymphatic:  Aortic atherosclerosis. No enlarged abdominal or pelvic lymph nodes. Reproductive: Uterus and bilateral adnexa are unremarkable. Other: No ascites. Musculoskeletal: No acute or suspicious bony abnormalities identified. Mild compression/Schmorl's nodes within the SUPERIOR endplates of L2 through L4 noted. Moderate degenerative disc disease at L4-5 and L5-S1 noted. No suspicious focal bony lesions identified. IMPRESSION: 1. Diffuse wall thickening of the proximal-mid sigmoid colon with adjacent stranding and with 3 x 4.5 cm ill-defined LEFT pelvic soft tissue structure. These findings are nonspecific but may represent acute diverticulitis with inflammatory/infectious LEFT pelvic process. Colonic neoplasm with LEFT pelvic adenopathy is not excluded however and clinical correlation and direct inspection follow-up recommended. 2. Moderate LEFT hydroureteronephrosis caused by the 3 x 4.5 cm ill-defined LEFT pelvic soft tissue structure. 3. Cholelithiasis without CT evidence of acute cholecystitis 4.  Aortic Atherosclerosis (ICD10-I70.0). Electronically Signed   By: Margarette Canada M.D.   On: 08/10/2018 14:14    Assessment/Plan Principal Problem:   Colonic diverticular abscess Moderate LEFT hydroureteronephrosis caused by a ill-defined LEFT pelvic mass  Active Problems:   Lupus ([facility])   Hyperlipidemia   HTN (hypertension)   Anxiety Bacteriuria  -Iv zosyn, supportive care. IR consulted for drainage as per Surgery Dr [name] Messing recommendation. May need colonoscopy upon resolution -Cont home med for anxiety -No urine Cx done at [facility] pt asymp. Cont is abx as above  to cover. nml renal function  -Not currently on meds for HLP or Htn    DVT prophylaxis: SCD Code Status: full Disposition Plan: home 2 days  Admission status: Obs med     Shelbie Proctor MD [facility] Pager [phone]  If 7PM-7AM, please contact night-coverage [email or web address] Password [facility]  08/10/2018, 11:31 PM

## 2018-08-10 NOTE — ED Notes (Signed)
Report to [place] at [facility]

## 2018-08-10 NOTE — Progress Notes (Signed)
Transfer from [place] Dr. Shirlyn Goltz on Patient Brittany Hobbs (DOB 02/21/1961)  Patient is a 57 yo female with a PMH of Arthritis, Asthma, HLD, Panic Attacks, Seasonal Allergies, Diverticulitis. Treated for Diverticultis as an outpatient and completed course.  Was doing well up until 5 days ago when started having lower abdominal pain again and treated again for suspected diverticulitis and was placed back on Cipro Flagyl.  Outpatient CT scan was ordered however this was never done as patient presented to the emergency room for worsening pain.  Work-up was done in the ED with a noncontrast CT as patient has anaphylactic allergy to IV contrast and this showed a suspected abscess.  General surgery was called by EDP and they feel this is an abscess and will consult later on.  Patient was accepted to a MedSurg bed and general surgery to see later on today.  Patient will be admitted for pain control as well as IV antibiotics as she has been placed on IV Zosyn.  General surgery recommending interventional radiology to drain suspected abscess they will need to be called once patient arrives. Patient hemodynamically stable and was accepted to the [place].

## 2018-08-10 NOTE — ED Triage Notes (Signed)
Pt here with recurrent LLQ abdominal pain with intermittent diarrhea. Has hx of lupus and woke up with bilateral left facial redness. Was put on antibiotics for diverticulitis on 10/19 and then again for suspected kidney stone this Thursday, but slept through the CT appt to confirm it. No other symptoms present at this time.

## 2018-08-10 NOTE — ED Provider Notes (Signed)
[facility] EMERGENCY DEPARTMENT Provider Note   CSN: [id number] Arrival date & time: 08/10/18  1024     History   Chief Complaint Chief Complaint  Patient presents with  . Abdominal Pain    HPI Brittany Hobbs is a 57 y.o. female.  HPI Patient is a 57 year old female presents the emergency department with ongoing left lower quadrant abdominal pain.  She was seen October 19 by her primary care physician for left lower quadrant pain and placed on Cipro Flagyl for 10 days.  She reports improvement in her symptoms at that time.  Patient was seen in the office again on 18 November 5 days ago with recurrent left lower quadrant pain and was placed back on Cipro Flagyl again an outpatient CT scan was ordered.  Patient slept through the CT appointment and has not had imaging.  She presents with ongoing left lower quadrant discomfort without fever chills.  She reports her pain is worsening.   Past Medical History:  Diagnosis Date  . Arthritis   . Asthma   . Hyperlipidemia   . Lupus ([place])   . Panic attacks   . Seasonal allergies     There are no active problems to display for this patient.   Past Surgical History:  Procedure Laterality Date  . scar tissue eye  1983   right  . TUBAL LIGATION  1985     OB History   None      Home Medications    Prior to Admission medications   Medication Sig Start Date End Date Taking? Authorizing Provider  atorvastatin (LIPITOR) 10 MG tablet Take 10 mg by mouth daily.    [provider]  BuPROPion HCl (WELLBUTRIN PO) Take by mouth daily.    [provider]  diphenhydrAMINE (BENADRYL) 50 MG tablet Take 50 mg by mouth at bedtime as needed.    [provider]  escitalopram (LEXAPRO) 20 MG tablet Take 20 mg by mouth daily.    [provider]  fluticasone Asencion Islam) 50 MCG/ACT nasal spray  10/29/11   [provider]  HYDROcodone-acetaminophen (VICODIN) 5-500 MG per tablet  01/05/12    [provider]  LORazepam (ATIVAN) 0.5 MG tablet Take 0.5 mg by mouth every 6 (six) hours as needed.    [provider]  meloxicam (MOBIC) 15 MG tablet  12/25/11   [provider]  metoprolol succinate (TOPROL-XL) 25 MG 24 hr tablet Take 25 mg by mouth daily.    [provider]  Naproxen Sodium (ALEVE PO) Take by mouth as needed.    [provider]  pravastatin (PRAVACHOL) 10 MG tablet Take 10 mg by mouth daily.    [provider]  Probiotic Product (SOLUBLE FIBER/PROBIOTICS PO) Take by mouth daily.    [provider]  zaleplon (SONATA) 5 MG capsule  01/01/12   [provider]    Family History Family History  Problem Relation Age of Onset  . Colon cancer Neg Hx   . Stomach cancer Neg Hx     Social History Social History   Tobacco Use  . Smoking status: Current Every Day Smoker    Packs/day: 0.30    Types: Cigarettes  . Smokeless tobacco: Never Used  Substance Use Topics  . Alcohol use: No  . Drug use: No     Allergies   Iodine; Betadine [povidone iodine]; and Sulfa antibiotics   Review of Systems Review of Systems  All other systems reviewed and are  negative.    Physical Exam Updated Vital Signs BP 122/74 (BP Location: Right Arm)   Pulse 68   Temp 97.9 F (36.6 C) (Oral)   Resp 16   Ht 5\' 6"  (1.676 m)   Wt 65.8 kg   SpO2 93%   BMI 23.40 kg/m   Physical Exam  Constitutional: She is oriented to person, place, and time. She appears well-developed and well-nourished. No distress.  HENT:  Head: Normocephalic and atraumatic.  Eyes: Pupils are equal, round, and reactive to light. EOM are normal.  Neck: Normal range of motion.  Cardiovascular: Normal rate, regular rhythm and normal heart sounds.  Pulmonary/Chest: Effort normal and breath sounds normal.  Abdominal: Soft. She exhibits no distension.  Left lower quadrant tenderness  Musculoskeletal: Normal range of motion.  Neurological: She  is alert and oriented to person, place, and time.  5/5 strength in major muscle groups of  bilateral upper and lower extremities. Speech normal. No facial asymetry.   Skin: Skin is warm and dry.  Psychiatric: She has a normal mood and affect. Judgment normal.  Nursing note and vitals reviewed.    ED Treatments / Results  Labs (all labs ordered are listed, but only abnormal results are displayed) Labs Reviewed  COMPREHENSIVE METABOLIC PANEL - Abnormal; Notable for the following components:      Result Value   Potassium 3.3 (*)    Glucose, Bld 109 (*)    BUN 22 (*)    All other components within normal limits  CBC - Abnormal; Notable for the following components:   RBC 5.64 (*)    Hemoglobin 15.1 (*)    HCT 47.6 (*)    All other components within normal limits  URINALYSIS, ROUTINE W REFLEX MICROSCOPIC - Abnormal; Notable for the following components:   Specific Gravity, Urine >1.030 (*)    Hgb urine dipstick LARGE (*)    Bilirubin Urine SMALL (*)    Leukocytes, UA TRACE (*)    All other components within normal limits  URINALYSIS, MICROSCOPIC (REFLEX) - Abnormal; Notable for the following components:   Bacteria, UA MANY (*)    All other components within normal limits  LIPASE, BLOOD    EKG None  Radiology Ct Abdomen Pelvis Wo Contrast  Result Date: 08/10/2018 CLINICAL DATA:  57 year old female with acute LEFT abdominal and pelvic pain. History of lupus. EXAM: CT ABDOMEN AND PELVIS WITHOUT CONTRAST TECHNIQUE: Multidetector CT imaging of the abdomen and pelvis was performed following the standard protocol without IV contrast, due to allergy. COMPARISON:  None. FINDINGS: Please note that parenchymal abnormalities may be missed without intravenous contrast. Lower chest: No acute abnormality. No pulmonary nodules identified in the lung bases. Hepatobiliary: The liver is unremarkable. Multiple gallstones are present without CT evidence of acute cholecystitis or biliary dilatation.  Pancreas: Unremarkable Spleen: Unremarkable Adrenals/Urinary Tract: Moderate LEFT hydroureteronephrosis is caused by a ill-defined LEFT pelvic mass which will be discussed. The RIGHT kidney, adrenal glands and bladder are unremarkable. Stomach/Bowel: Diffuse wall thickening of the proximal/mid sigmoid colon noted with adjacent stranding. A 3 x 4.5 cm ill-defined soft tissue structure in the LEFT iliac region noted (series 2: Image 53), causing LEFT hydronephrosis. This soft tissue structure lies close to but not immediately adjacent to the thick-walled sigmoid colon. No bowel obstruction, pneumoperitoneum or other definite collection. The appendix is normal. Extensive sigmoid colonic diverticula noted. Vascular/Lymphatic: Aortic atherosclerosis. No enlarged abdominal or pelvic lymph nodes. Reproductive: Uterus and bilateral adnexa are unremarkable. Other: No ascites. Musculoskeletal: No  acute or suspicious bony abnormalities identified. Mild compression/Schmorl's nodes within the SUPERIOR endplates of L2 through L4 noted. Moderate degenerative disc disease at L4-5 and L5-S1 noted. No suspicious focal bony lesions identified. IMPRESSION: 1. Diffuse wall thickening of the proximal-mid sigmoid colon with adjacent stranding and with 3 x 4.5 cm ill-defined LEFT pelvic soft tissue structure. These findings are nonspecific but may represent acute diverticulitis with inflammatory/infectious LEFT pelvic process. Colonic neoplasm with LEFT pelvic adenopathy is not excluded however and clinical correlation and direct inspection follow-up recommended. 2. Moderate LEFT hydroureteronephrosis caused by the 3 x 4.5 cm ill-defined LEFT pelvic soft tissue structure. 3. Cholelithiasis without CT evidence of acute cholecystitis 4.  Aortic Atherosclerosis (ICD10-I70.0). Electronically Signed   By: Margarette Canada M.D.   On: 08/10/2018 14:14    Procedures Procedures (including critical care time)  Medications Ordered in ED Medications   piperacillin-tazobactam (ZOSYN) IVPB 3.375 g (3.375 g Intravenous New Bag/Given 08/10/18 1550)  0.9 %  sodium chloride infusion (250 mLs Intravenous New Bag/Given 08/10/18 1548)  morphine 4 MG/ML injection 6 mg (6 mg Intravenous Given 08/10/18 1134)  sodium chloride 0.9 % bolus 1,000 mL (0 mLs Intravenous Stopped 08/10/18 1237)     Initial Impression / Assessment and Plan / ED Course  I have reviewed the triage vital signs and the nursing notes.  Pertinent labs & imaging results that were available during my care of the patient were reviewed by me and considered in my medical decision making (see chart for details).     Patient with new intra-abdominal mass near what appears to be an inflamed colon.  Her history is concerning for perforated diverticulitis with diverticular abscess given the history.  CT scan without IV contrast which can limit the evaluation for abscess.  Given her clinical picture and her ongoing tenderness in the left lower quadrant she will be admitted for IV antibiotics and treatment of comp gated diverticulitis.  [facility] hospitalist to admit  Final Clinical Impressions(s) / ED Diagnoses   Final diagnoses:  Acute diverticulitis    ED Discharge Orders    None       Jola Schmidt, MD 08/10/18 [phone]

## 2018-08-10 NOTE — ED Notes (Signed)
Patient ambulated to restroom to give urine sample and was unable to void. Will try after liter of IV fluid.

## 2018-08-10 NOTE — ED Notes (Signed)
Patient transported to CT 

## 2018-08-10 NOTE — ED Notes (Signed)
Pt drinking PO Redicat (brarium) for CT scan, NO IV CONTRAST will be given, d/t confirmed allergy to internal iodine

## 2018-08-10 NOTE — ED Notes (Signed)
Report given to [facility] at [facility] for Rm 5E/1505

## 2018-08-10 NOTE — ED Provider Notes (Signed)
  Physical Exam  BP 110/62 (BP Location: Right Arm)   Pulse 67   Temp 97.9 F (36.6 C) (Oral)   Resp 18   Ht 5\' 6"  (1.676 m)   Wt 65.8 kg   SpO2 96%   BMI 23.40 kg/m   Physical Exam  ED Course/Procedures     Procedures  MDM  Care assumed at 4:30 pm. Patient was treated for diverticulitis a month ago and symptoms improved but worsened 5 days ago so started on cipro and flagyl again and pain worsened. CT showed possible diverticular abscess. Given IV abx. Pending admission.   5:07 PM Dr. Lucia Gaskins from surgery will see patient and recommend IV abx and IR drainage. Hospitalist to admit.    Drenda Freeze, MD 08/10/18 [phone]

## 2018-08-11 DIAGNOSIS — E876 Hypokalemia: Secondary | ICD-10-CM

## 2018-08-11 DIAGNOSIS — R8271 Bacteriuria: Secondary | ICD-10-CM

## 2018-08-11 DIAGNOSIS — N1330 Unspecified hydronephrosis: Secondary | ICD-10-CM

## 2018-08-11 LAB — HIV ANTIBODY (ROUTINE TESTING W REFLEX): HIV SCREEN 4TH GENERATION: NONREACTIVE

## 2018-08-11 MED ORDER — GABAPENTIN 400 MG PO CAPS
800.0000 mg | ORAL_CAPSULE | Freq: Three times a day (TID) | ORAL | Status: DC
  Administered 2018-08-11 – 2018-08-14 (×9): 800 mg via ORAL
  Filled 2018-08-11 (×9): qty 2

## 2018-08-11 MED ORDER — SODIUM CHLORIDE 0.9 % IV SOLN
INTRAVENOUS | Status: DC | PRN
  Administered 2018-08-11: 250 mL via INTRAVENOUS

## 2018-08-11 MED ORDER — ESCITALOPRAM OXALATE 20 MG PO TABS
20.0000 mg | ORAL_TABLET | Freq: Every day | ORAL | Status: DC
  Administered 2018-08-11 – 2018-08-14 (×4): 20 mg via ORAL
  Filled 2018-08-11 (×4): qty 1

## 2018-08-11 MED ORDER — KCL IN DEXTROSE-NACL 20-5-0.45 MEQ/L-%-% IV SOLN
INTRAVENOUS | Status: DC
  Administered 2018-08-11 – 2018-08-13 (×6): via INTRAVENOUS
  Filled 2018-08-11 (×8): qty 1000

## 2018-08-11 MED ORDER — LORAZEPAM 1 MG PO TABS
1.0000 mg | ORAL_TABLET | Freq: Three times a day (TID) | ORAL | Status: DC | PRN
  Administered 2018-08-11 – 2018-08-13 (×6): 1 mg via ORAL
  Filled 2018-08-11 (×6): qty 1

## 2018-08-11 NOTE — H&P (Signed)
Re:   Brittany Hobbs DOB:   1961/05/12 MRN:   [id number]  Chief Complaint Abdominal pain  ASSESEMENT AND PLAN: 1.  Diverticulitis with abscess  Discussed with IR, not amendable to perc drain  Zosyn - 11/23 >>>  Agree with NPO (she may have sips of water and ice chips), antibiotics, and IVF (I will increase the volume that she is getting)  Will follow  2.  Left hyro-ureteronephrosis 3.  Anxiety 4.  Gall stones 5.  Atherosclerosis 6.  Lupus - skin condition of the face 7.  Smokes - 1/3 ppd  Chief Complaint  Patient presents with  . Abdominal Pain   PHYSICIAN REQUESTING CONSULTATION:   Dr. Cristal Ford, Hospitalist  HISTORY OF PRESENT ILLNESS: Brittany Hobbs is a 57 y.o. (DOB: 1960/11/10)  white female whose primary care physician is Berkley Harvey, NP .   Ms. Stenglein came in through [facility] with abdominal pain.  She had a negative colonoscopy around 2015.  Unsure of the physician.    She developed LLQ abdominal pain and fever in October, 2019, and was treated with 10 days of antibiotics by Ms. Ronnald Ramp.  She felt better and was doing well until the pain returned on 08/04/2018.  She was again started on antibiotics.  It does not sound like she was sick as her "attack" in October.  But she was not able to work this week.  The pain got worse on Saturday and she went to [place].  She has no history of stomach, liver, gall bladder, or colon disease.  She has had no prior abdominal surgery.  CT Abdomen - 08/10/2018 - 1. Diffuse wall thickening of the proximal-mid sigmoid colon with adjacent stranding and with 3 x 4.5 cm ill-defined LEFT pelvic soft tissue structure. These findings are nonspecific but may represent acute diverticulitis with inflammatory/infectious LEFT pelvic process. Colonic neoplasm with LEFT pelvic adenopathy is not excluded however and clinical correlation and direct inspection follow-up recommended.  2. Moderate LEFT  hydroureteronephrosis caused by the 3 x 4.5 cm ill-defined LEFT pelvic soft tissue structure.  3. Cholelithiasis without CT evidence of acute cholecystitis  4.  Aortic Atherosclerosis (ICD10-I70.0).  WBC - 10,700 - 07/10/2018   Past Medical History:  Diagnosis Date  . Arthritis   . Asthma   . Hyperlipidemia   . Lupus ([place])   . Panic attacks   . Seasonal allergies       Past Surgical History:  Procedure Laterality Date  . scar tissue eye  1983   right  . TUBAL LIGATION  1985      Current Facility-Administered Medications  Medication Dose Route Frequency Provider Last Rate Last Dose  . 0.9 %  sodium chloride infusion   Intravenous PRN Johnson-Pitts, Endia, MD   Stopped at 08/10/18 1627  . 0.9 %  sodium chloride infusion   Intravenous PRN Cristal Ford, DO 10 mL/hr at 08/11/18 0535 250 mL at 08/11/18 0535  . 0.9 % NaCl with KCl 20 mEq/ L  infusion   Intravenous Continuous Johnson-Pitts, Endia, MD 75 mL/hr at 08/10/18 2252    . acetaminophen (TYLENOL) tablet 650 mg  650 mg Oral Q6H PRN Johnson-Pitts, Endia, MD       Or  . acetaminophen (TYLENOL) suppository 650 mg  650 mg Rectal Q6H PRN Johnson-Pitts, Endia, MD      . HYDROcodone-acetaminophen (NORCO/VICODIN) 5-325 MG per tablet 1-2 tablet  1-2 tablet Oral Q4H PRN Johnson-Pitts, Endia,  MD   2 tablet at 08/11/18 1102  . ketorolac (TORADOL) 15 MG/ML injection 15 mg  15 mg Intravenous Q6H PRN Johnson-Pitts, Endia, MD   15 mg at 08/11/18 0100  . ondansetron (ZOFRAN) tablet 4 mg  4 mg Oral Q6H PRN Johnson-Pitts, Endia, MD       Or  . ondansetron (ZOFRAN) injection 4 mg  4 mg Intravenous Q6H PRN Johnson-Pitts, Endia, MD   4 mg at 08/11/18 0729  . piperacillin-tazobactam (ZOSYN) IVPB 3.375 g  3.375 g Intravenous Q8H Johnson-Pitts, Endia, MD 12.5 mL/hr at 08/11/18 0535 3.375 g at 08/11/18 0535  . polyethylene glycol (MIRALAX / GLYCOLAX) packet 17 g  17 g Oral Daily PRN Johnson-Pitts, Endia, MD          Allergies  Allergen Reactions   . Iodine Anaphylaxis    Pt states she is allergic to INTERNAL IODINE  . Betadine [Povidone Iodine] Hives  . Sulfa Antibiotics     N/V    REVIEW OF SYSTEMS: Skin:  Has lupus which involves her face.  Usually treated with steroids. Infection:  No history of hepatitis or HIV.  No history of MRSA. Neurologic:  No history of stroke.  No history of seizure.  No history of headaches. Cardiac:  No history of hypertension. No history of heart disease.  No history of prior cardiac catheterization.  No history of seeing a cardiologist. Pulmonary:  Smokes 1/3 ppd  Endocrine:  No diabetes. No thyroid disease. Gastrointestinal:  See HPI Urologic:  No history of kidney stones.  No history of bladder infections. Musculoskeletal:  Prior left knee surgery Hematologic:  No bleeding disorder.  No history of anemia.  Not anticoagulated. Psycho-social:  The patient is oriented.   The patient has no obvious psychologic or social impairment to understanding our conversation and plan.  SOCIAL and FAMILY HISTORY: Widowed (her husband died about 3 years ago) She has 4 children:  2 natural - 40 yo son, 82 yo daughter, and 2 step children - 75 yo and 40 yo. 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  PHYSICAL EXAM: BP (!) 148/88 (BP Location: Left Arm)   Pulse 64   Temp 97.7 F (36.5 C) (Oral)   Resp (!) 21   Ht 5\' 6"  (1.676 m)   Wt 65.8 kg   SpO2 93%   BMI 23.40 kg/m   General: WN older WF who is alert and generally healthy appearing.  She looks older that her stated age (cigarettes?) Skin:  Inspection and palpation - no mass or rash. Eyes:  Conjunctiva and lids unremarkable.            Pupils are equal Ears, Nose, Mouth, and Throat:  Ears and nose unremarkable            Lips and teeth are unremarable. Neck: Supple. No mass, trachea midline.  No thyroid mass. Lymph Nodes:  No supraclavicular, cervical, or inguinal nodes. Lungs: Normal respiratory  effort.  Clear to auscultation and symmetric breath sounds. Heart:  Palpation of the heart is normal.            Auscultation: RRR. No murmur or rub.  Abdomen: Soft. No mass. No hernia. Normal bowel sounds.  No abdominal scars.      She is tender in her LLQ - but I feel no mass.  She has no peritoneal sxes. Rectal: Not done. Musculoskeletal:  Good muscle strength and ROM  in upper and lower extremities.  Neurologic:  Grossly intact to motor and sensory function. Psychiatric: Normal judgement and insight. Behavior is normal.            Oriented to time, person, place.   DATA REVIEWED, COUNSELING AND COORDINATION OF CARE: Epic notes reviewed. Counseling and coordination of care exceeded more than 50% of the time spent with patient. Total time spent with patient and charting: 50 minutes  Alphonsa Overall, MD,  [facility], [address]  [place], [place]    [place] Phone:  [phone] FAX:  [phone]

## 2018-08-11 NOTE — Progress Notes (Addendum)
PROGRESS NOTE    Brittany Hobbs  IWO:[id number] DOB: 06/08/1961 DOA: 08/10/2018 PCP: Berkley Harvey, NP   Brief Narrative:  HPI On 08/10/2018 by Dr. Wells Guiles Johnson-Pitts Brittany Hobbs is a 57 y.o. female with medical history significant of lupus and anxiety presents with left lower quadrant pain.  Patient has had 2 bouts of diverticulitis over the past 2 months.  She was treated with p.o. Cipro and Flagyl per PCP x2.  Past few days has had increasing left lower quadrant pain and anorexia.  She had IV contrast dye allergy so was given p.o. contrast of the CT scan of her abdomen pelvis showed changes suggestive of a abscess.  This area was also causing left hydroureteronephrosis.  Patient was transferred here from [facility].  Per documentation Dr. Lucia Gaskins general surgery recommended IR for drainage.  Patient was hemodynamically stable here.  She is afebrile here.  No leukocytosis.  Interim history Admitted for abdominal pain likely secondary to recurrent diverticulitis with colonic diverticular abscess.  Interventional radiology consulted however unable to place drain.  General surgery consulted and pending further recommendations.  Assessment & Plan   Abdominal pain secondary to Recurrent Acute diverticulitis/Colonic diverticular abscess -CT abdomen pelvis showed diffuse wall thickening of the proximal-mid sigmoid colon with adjacent stranding, 3 x 4.5 cm ill-defined left pelvic soft tissue structure, findings are nonspecific but may represent acute diverticulitis with inflammatory/infectious left pelvic process.  Colonic neoplasm with left pelvic adenopathy is not excluded. -Patient was recently had episode in October and was treated.  She was also started on oral antibiotics on 08/04/2018 with ciprofloxacin and Flagyl however continues to have worsening symptoms. -Interventional radiology consulted and appreciated however no clear window for access to collection.  Recommended  antibiotic treatment and rescan in 5 to 7 days. -General surgery consulted and appreciated -Currently n.p.o. -Continue pain management, antiemetics as needed -Continue IV zosyn and IVF  Moderate left hydro-ureternephrosis -Secondary to the above, noted on CT scan -Continue to monitor intake and output  Asymptomatic bacteriuria -UA showed many bacteria, 6-10 WBC, trace leukocytes -Currently patient states she has no dysuria or foul-smelling urine -We will continue IV Zosyn for the above process  Hypokalemia -Will replace and continue to monitor BMP -Magnesium 2.1  Anxiety -On Lexapro and Ativan at home, currently held -Will place on IV Ativan  DVT Prophylaxis  SCDs  Code Status: Full  Family Communication: None at bedside  Disposition Plan: Observation. Feel patient meet inpatient admission given that her pain is uncontrolled and she requires IV pain control as well as antibiotics.  She is unable to tolerate anything by mouth at this time.  Pending improvement in abdominal pain and general surgery recommendations  Consultants Interventional radiology General surgery  Procedures  None  Antibiotics   Anti-infectives (From admission, onward)   Start     Dose/Rate Route Frequency Ordered Stop   08/10/18 2300  piperacillin-tazobactam (ZOSYN) IVPB 3.375 g     3.375 g 12.5 mL/hr over 240 Minutes Intravenous Every 8 hours 08/10/18 2121     08/10/18 1545  piperacillin-tazobactam (ZOSYN) IVPB 3.375 g     3.375 g 100 mL/hr over 30 Minutes Intravenous  Once 08/10/18 1531 08/10/18 1626      Subjective:   Brittany Hobbs seen and examined today.  Continues to complain of abdominal pain, in the left lower quadrant along with nausea. States movement affects her pain.  Denies current vomiting.  Has had a bowel movement.  Denies chest pain, shortness of breath, dizziness or headache.  Objective:   Vitals:   08/10/18 1900 08/10/18 2005 08/10/18 2102 08/11/18 0503  BP: 118/69  (!) 153/75 (!) 145/86 (!) 148/88  Pulse: 64 69 71 64  Resp: 20 20 19  (!) 21  Temp:   98.3 F (36.8 C) 97.7 F (36.5 C)  TempSrc:   Oral Oral  SpO2: 94% 94% 91% 93%  Weight:      Height:        Intake/Output Summary (Last 24 hours) at 08/11/2018 1059 Last data filed at 08/11/2018 0641 Gross per 24 hour  Intake 1381.58 ml  Output -  Net 1381.58 ml   Filed Weights   08/10/18 1031  Weight: 65.8 kg    Exam  General: Well developed, well nourished, NAD, appears stated age  41: NCAT, mucous membranes moist.   Neck: Supple  Cardiovascular: S1 S2 auscultated, no rubs, murmurs or gallops. Regular rate and rhythm.  Respiratory: Clear to auscultation bilaterally with equal chest rise  Abdomen: Soft, LLQ TTP, nondistended, + bowel sounds  Extremities: warm dry without cyanosis clubbing or edema  Neuro: AAOx3, nonfocal  Psych: Normal affect and demeanor with intact judgement and insight   Data Reviewed: I have personally reviewed following labs and imaging studies  CBC: Recent Labs  Lab 08/10/18 1036 08/10/18 2226  WBC 8.4 10.7*  HGB 15.1* 14.6  HCT 47.6* 45.8  MCV 84.4 85.9  PLT 385 016   Basic Metabolic Panel: Recent Labs  Lab 08/10/18 1036 08/10/18 2226  NA 140 140  K 3.3* 3.0*  CL 102 105  CO2 27 28  GLUCOSE 109* 105*  BUN 22* 20  CREATININE 0.90 0.77  CALCIUM 9.4 9.2  MG  --  2.1   GFR: Estimated Creatinine Clearance: 73.5 mL/min (by C-G formula based on SCr of 0.77 mg/dL). Liver Function Tests: Recent Labs  Lab 08/10/18 1036  AST 20  ALT 14  ALKPHOS 79  BILITOT 0.5  PROT 8.1  ALBUMIN 3.6   Recent Labs  Lab 08/10/18 1036  LIPASE 27   No results for input(s): AMMONIA in the last 168 hours. Coagulation Profile: No results for input(s): INR, PROTIME in the last 168 hours. Cardiac Enzymes: No results for input(s): CKTOTAL, CKMB, CKMBINDEX, TROPONINI in the last 168 hours. BNP (last 3 results) No results for input(s): PROBNP in the  last 8760 hours. HbA1C: No results for input(s): HGBA1C in the last 72 hours. CBG: No results for input(s): GLUCAP in the last 168 hours. Lipid Profile: No results for input(s): CHOL, HDL, LDLCALC, TRIG, CHOLHDL, LDLDIRECT in the last 72 hours. Thyroid Function Tests: No results for input(s): TSH, T4TOTAL, FREET4, T3FREE, THYROIDAB in the last 72 hours. Anemia Panel: No results for input(s): VITAMINB12, FOLATE, FERRITIN, TIBC, IRON, RETICCTPCT in the last 72 hours. Urine analysis:    Component Value Date/Time   COLORURINE YELLOW 08/10/2018 1230   APPEARANCEUR CLEAR 08/10/2018 1230   LABSPEC >1.030 (H) 08/10/2018 1230   PHURINE 5.0 08/10/2018 1230   GLUCOSEU NEGATIVE 08/10/2018 1230   HGBUR LARGE (A) 08/10/2018 1230   BILIRUBINUR SMALL (A) 08/10/2018 1230   KETONESUR NEGATIVE 08/10/2018 1230   PROTEINUR NEGATIVE 08/10/2018 1230   NITRITE NEGATIVE 08/10/2018 1230   LEUKOCYTESUR TRACE (A) 08/10/2018 1230   Sepsis Labs: @LABRCNTIP (procalcitonin:4,lacticidven:4)  )No results found for this or any previous visit (from the past 240 hour(s)).    Radiology Studies: Ct Abdomen Pelvis Wo Contrast  Result Date: 08/10/2018 CLINICAL DATA:  57 year old female with acute LEFT abdominal and pelvic pain. History of lupus. EXAM: CT ABDOMEN AND PELVIS WITHOUT CONTRAST TECHNIQUE: Multidetector CT imaging of the abdomen and pelvis was performed following the standard protocol without IV contrast, due to allergy. COMPARISON:  None. FINDINGS: Please note that parenchymal abnormalities may be missed without intravenous contrast. Lower chest: No acute abnormality. No pulmonary nodules identified in the lung bases. Hepatobiliary: The liver is unremarkable. Multiple gallstones are present without CT evidence of acute cholecystitis or biliary dilatation. Pancreas: Unremarkable Spleen: Unremarkable Adrenals/Urinary Tract: Moderate LEFT hydroureteronephrosis is caused by a ill-defined LEFT pelvic mass which  will be discussed. The RIGHT kidney, adrenal glands and bladder are unremarkable. Stomach/Bowel: Diffuse wall thickening of the proximal/mid sigmoid colon noted with adjacent stranding. A 3 x 4.5 cm ill-defined soft tissue structure in the LEFT iliac region noted (series 2: Image 53), causing LEFT hydronephrosis. This soft tissue structure lies close to but not immediately adjacent to the thick-walled sigmoid colon. No bowel obstruction, pneumoperitoneum or other definite collection. The appendix is normal. Extensive sigmoid colonic diverticula noted. Vascular/Lymphatic: Aortic atherosclerosis. No enlarged abdominal or pelvic lymph nodes. Reproductive: Uterus and bilateral adnexa are unremarkable. Other: No ascites. Musculoskeletal: No acute or suspicious bony abnormalities identified. Mild compression/Schmorl's nodes within the SUPERIOR endplates of L2 through L4 noted. Moderate degenerative disc disease at L4-5 and L5-S1 noted. No suspicious focal bony lesions identified. IMPRESSION: 1. Diffuse wall thickening of the proximal-mid sigmoid colon with adjacent stranding and with 3 x 4.5 cm ill-defined LEFT pelvic soft tissue structure. These findings are nonspecific but may represent acute diverticulitis with inflammatory/infectious LEFT pelvic process. Colonic neoplasm with LEFT pelvic adenopathy is not excluded however and clinical correlation and direct inspection follow-up recommended. 2. Moderate LEFT hydroureteronephrosis caused by the 3 x 4.5 cm ill-defined LEFT pelvic soft tissue structure. 3. Cholelithiasis without CT evidence of acute cholecystitis 4.  Aortic Atherosclerosis (ICD10-I70.0). Electronically Signed   By: Margarette Canada M.D.   On: 08/10/2018 14:14     Scheduled Meds: Continuous Infusions: . sodium chloride Stopped (08/10/18 1627)  . sodium chloride 250 mL (08/11/18 0535)  . 0.9 % NaCl with KCl 20 mEq / L 75 mL/hr at 08/10/18 2252  . piperacillin-tazobactam (ZOSYN)  IV 3.375 g (08/11/18  0535)     LOS: 1 day   Time Spent in minutes   45 minutes (greater than 50% of time spent with patient face to face, as well as reviewing old records, calling consults, and formulating a plan)  Cristal Ford D.O. on 08/11/2018 at 10:59 AM  Between 7am to 7pm - Please see pager noted on [email or web address]  After 7pm go to [email or web address]  And look for the night coverage person covering for me after hours  [facility] Office  [phone]

## 2018-08-11 NOTE — Progress Notes (Signed)
Patient ID: Brittany Hobbs, female   DOB: 10/28/1960, 57 y.o.   MRN: [id number]   Request for percutaneous abscess drain placement.  IMPRESSION: 1. Diffuse wall thickening of the proximal-mid sigmoid colon with adjacent stranding and with 3 x 4.5 cm ill-defined LEFT pelvic soft tissue structure. These findings are nonspecific but may represent acute diverticulitis with inflammatory/infectious LEFT pelvic process. Colonic neoplasm with LEFT pelvic adenopathy is not excluded however and clinical correlation and direct inspection follow-up recommended. 2. Moderate LEFT hydroureteronephrosis caused by the 3 x 4.5 cm ill-defined LEFT pelvic soft tissue structure. 3. Cholelithiasis without CT evidence of acute cholecystitis 4.  Aortic Atherosclerosis (ICD10-I70.0).  Imaging reviewed with Dr Annamaria Boots Collection small NO clear window for access to collection. High pelvic abscess- bowel and vasculature inhibits access.  Rec: antibiotic treatment and re scan 5-7 days  Dr Ree Kida made aware

## 2018-08-12 LAB — CBC
HCT: 40.2 % (ref 36.0–46.0)
Hemoglobin: 12.5 g/dL (ref 12.0–15.0)
MCH: 26.7 pg (ref 26.0–34.0)
MCHC: 31.1 g/dL (ref 30.0–36.0)
MCV: 85.7 fL (ref 80.0–100.0)
Platelets: 281 10*3/uL (ref 150–400)
RBC: 4.69 MIL/uL (ref 3.87–5.11)
RDW: 15 % (ref 11.5–15.5)
WBC: 5.2 10*3/uL (ref 4.0–10.5)
nRBC: 0 % (ref 0.0–0.2)

## 2018-08-12 LAB — BASIC METABOLIC PANEL
Anion gap: 6 (ref 5–15)
BUN: 11 mg/dL (ref 6–20)
CO2: 27 mmol/L (ref 22–32)
CREATININE: 0.83 mg/dL (ref 0.44–1.00)
Calcium: 8.6 mg/dL — ABNORMAL LOW (ref 8.9–10.3)
Chloride: 110 mmol/L (ref 98–111)
GFR calc Af Amer: >60 mL/min (ref >60)
GFR calc non Af Amer: >60 mL/min (ref >60)
GLUCOSE: 111 mg/dL — AB (ref 70–99)
Potassium: 4.2 mmol/L (ref 3.5–5.1)
Sodium: 143 mmol/L (ref 135–145)

## 2018-08-12 MED ORDER — NICOTINE 14 MG/24HR TD PT24
14.0000 mg | MEDICATED_PATCH | Freq: Every day | TRANSDERMAL | Status: DC
  Administered 2018-08-12 – 2018-08-14 (×3): 14 mg via TRANSDERMAL
  Filled 2018-08-12 (×3): qty 1

## 2018-08-12 NOTE — Progress Notes (Addendum)
    CC: Abdominal pain  Subjective: She is feeling much better.  Pain is gone this AM.     Objective: Vital signs in last 24 hours: Temp:  [97.6 F (36.4 C)-98.1 F (36.7 C)] 98.1 F (36.7 C) (11/25 0610) Pulse Rate:  [56-60] 60 (11/25 0610) Resp:  [17-18] 18 (11/25 0610) BP: (127-151)/(73-99) 151/73 (11/25 0610) SpO2:  [91 %-96 %] 91 % (11/25 0610) Last BM Date: 08/11/18 N.p.o. 2399 IV Voided x7 BP improved and stable afebrile, VSS WBC down to 5.2, CMP stable Admission CT 08/10/2018:A 3 x 4.5 cm ill-defined soft tissue structure in the LEFT iliac region noted causing LEFT hydronephrosis. This soft tissue structure lies close to but not immediately adjacent to the thick-walled sigmoid colon.  Intake/Output from previous day: 11/24 0701 - 11/25 0700 In: 2399.8 [I.V.:2263; IV Piggyback:136.7] Out: -  Intake/Output this shift: No intake/output data recorded.  General appearance: alert, cooperative and no distress Resp: clear to auscultation bilaterally GI: soft, non-tender; bowel sounds normal; no masses,  no organomegaly and pain is gone this AM  Lab Results:  Recent Labs    08/10/18 2226 08/12/18 0603  WBC 10.7* 5.2  HGB 14.6 12.5  HCT 45.8 40.2  PLT 351 281    BMET Recent Labs    08/10/18 2226 08/12/18 0603  NA 140 143  K 3.0* 4.2  CL 105 110  CO2 28 27  GLUCOSE 105* 111*  BUN 20 11  CREATININE 0.77 0.83  CALCIUM 9.2 8.6*   PT/INR No results for input(s): LABPROT, INR in the last 72 hours.  Recent Labs  Lab 08/10/18 1036  AST 20  ALT 14  ALKPHOS 79  BILITOT 0.5  PROT 8.1  ALBUMIN 3.6     Lipase     Component Value Date/Time   LIPASE 27 08/10/2018 1036     Medications: . escitalopram  20 mg Oral Daily  . gabapentin  800 mg Oral TID  . nicotine  14 mg Transdermal Daily   . sodium chloride Stopped (08/10/18 1627)  . sodium chloride 10 mL/hr at 08/12/18 0300  . dextrose 5 % and 0.45 % NaCl with KCl 20 mEq/L 125 mL/hr at 08/12/18  0300  . piperacillin-tazobactam (ZOSYN)  IV 3.375 g (08/12/18 0537)   Anti-infectives (From admission, onward)   Start     Dose/Rate Route Frequency Ordered Stop   08/10/18 2300  piperacillin-tazobactam (ZOSYN) IVPB 3.375 g     3.375 g 12.5 mL/hr over 240 Minutes Intravenous Every 8 hours 08/10/18 2121     08/10/18 1545  piperacillin-tazobactam (ZOSYN) IVPB 3.375 g     3.375 g 100 mL/hr over 30 Minutes Intravenous  Once 08/10/18 1531 08/10/18 1626     Assessment/Plan Anxiety  Gall stones   Atherosclerosis   Lupus - skin condition of the face  Smokes - 1/3 ppd  Recurrent diverticulitis with abscess;  antibiotic treatment, 06/2018, & 08/04/2018; admit 08/11/2018   -Not amenable to IR drainage  Left hydro-ureteronephrosis  FEN: IV fluids/n.p.o. ID: Zosyn 11/23 =>> day 2 DVT: SCDs only -may have anticoagulant from our standpoint Follow-up: TBD   Plan: Start clear liquids and continue IV Zosyn.  LOS: 2 days    JENNINGS,WILLARD 08/12/2018 [phone]  Agree with above. Clearly getting better. On clear liquids.  Ambulating well.  Alphonsa Overall, MD, [facility] Surgery Pager: [phone] Office phone:  [phone]

## 2018-08-12 NOTE — Progress Notes (Signed)
PROGRESS NOTE    Brittany Hobbs  RFF:[id number] DOB: [birth year] DOA: 08/10/2018 PCP: Berkley Harvey, NP   Brief Narrative:  HPI On 08/10/2018 by Dr. Wells Guiles Johnson-Pitts Brittany Hobbs is a 57 y.o. female with medical history significant of lupus and anxiety presents with left lower quadrant pain.  Patient has had 2 bouts of diverticulitis over the past 2 months.  She was treated with p.o. Cipro and Flagyl per PCP x2.  Past few days has had increasing left lower quadrant pain and anorexia.  She had IV contrast dye allergy so was given p.o. contrast of the CT scan of her abdomen pelvis showed changes suggestive of a abscess.  This area was also causing left hydroureteronephrosis.  Patient was transferred here from [facility].  Per documentation Dr. Lucia Gaskins general surgery recommended IR for drainage.  Patient was hemodynamically stable here.  She is afebrile here.  No leukocytosis.  Interim history Admitted for abdominal pain likely secondary to recurrent diverticulitis with colonic diverticular abscess.  Interventional radiology consulted however unable to place drain.  General surgery consulted and pending further recommendations.  Assessment & Plan   Abdominal pain secondary to Recurrent Acute diverticulitis/Colonic diverticular abscess -CT abdomen pelvis showed diffuse wall thickening of the proximal-mid sigmoid colon with adjacent stranding, 3 x 4.5 cm ill-defined left pelvic soft tissue structure, findings are nonspecific but may represent acute diverticulitis with inflammatory/infectious left pelvic process.  Colonic neoplasm with left pelvic adenopathy is not excluded. -Patient was recently had episode in October and was treated.  She was also started on oral antibiotics on 08/04/2018 with ciprofloxacin and Flagyl however continues to have worsening symptoms. -Interventional radiology consulted and appreciated however no clear window for access to collection.  Recommended  antibiotic treatment and rescan in 5 to 7 days. -General surgery consulted and appreciated -Currently n.p.o. -Continue pain management, antiemetics as needed -Continue IV zosyn and IVF -Discussed with surgery, can start on clear liquid diet today  Moderate left hydro-ureternephrosis -Secondary to the above, noted on CT scan -Continue to monitor intake and output  Asymptomatic bacteriuria -UA showed many bacteria, 6-10 WBC, trace leukocytes -Currently patient states she has no dysuria or foul-smelling urine -Will continue IV Zosyn for the above process  Hypokalemia -Resolved with replacement, continue to monitor BMP -Magnesium 2.1  Anxiety -On Lexapro and Ativan at home, currently held -Will place on IV Ativan  DVT Prophylaxis  SCDs  Code Status: Full  Family Communication: Friend at bedside  Disposition Plan: Admitted. Pending improvement and further recommendations from general surgery  Consultants Interventional radiology General surgery  Procedures  None  Antibiotics   Anti-infectives (From admission, onward)   Start     Dose/Rate Route Frequency Ordered Stop   08/10/18 2300  piperacillin-tazobactam (ZOSYN) IVPB 3.375 g     3.375 g 12.5 mL/hr over 240 Minutes Intravenous Every 8 hours 08/10/18 2121     08/10/18 1545  piperacillin-tazobactam (ZOSYN) IVPB 3.375 g     3.375 g 100 mL/hr over 30 Minutes Intravenous  Once 08/10/18 1531 08/10/18 1626      Subjective:   Garyn Hobbs seen and examined today.  Feels abdominal pain has improved. Feeling hunger pains now. Denies current chest pain, shortness of breath, N/V. Feels she may be constipated, although had a bowel movement yesterday.   Objective:   Vitals:   08/11/18 0503 08/11/18 1438 08/11/18 2036 08/12/18 0610  BP: (!) 148/88 (!) 141/99 127/78 (!) 151/73  Pulse: 64 (!)  58 (!) 56 60  Resp: (!) 21 18 17 18   Temp: 97.7 F (36.5 C) 98 F (36.7 C) 97.6 F (36.4 C) 98.1 F (36.7 C)  TempSrc: Oral  Oral Oral Oral  SpO2: 93% 94% 96% 91%  Weight:      Height:        Intake/Output Summary (Last 24 hours) at 08/12/2018 1227 Last data filed at 08/12/2018 0300 Gross per 24 hour  Intake 1941.03 ml  Output -  Net 1941.03 ml   Filed Weights   08/10/18 1031  Weight: 65.8 kg   Exam  General: Well developed, well nourished, NAD, appears stated age  64: NCAT, mucous membranes moist.   Neck: Supple  Cardiovascular: S1 S2 auscultated, RRR, no murmur  Respiratory: Clear to auscultation bilaterally with equal chest rise  Abdomen: Soft, nontender, nondistended, + bowel sounds  Extremities: warm dry without cyanosis clubbing or edema  Neuro: AAOx3, nonfocal  Psych: Pleasant, appropriate mood and affect  Data Reviewed: I have personally reviewed following labs and imaging studies  CBC: Recent Labs  Lab 08/10/18 1036 08/10/18 2226 08/12/18 0603  WBC 8.4 10.7* 5.2  HGB 15.1* 14.6 12.5  HCT 47.6* 45.8 40.2  MCV 84.4 85.9 85.7  PLT 385 351 423   Basic Metabolic Panel: Recent Labs  Lab 08/10/18 1036 08/10/18 2226 08/12/18 0603  NA 140 140 143  K 3.3* 3.0* 4.2  CL 102 105 110  CO2 27 28 27   GLUCOSE 109* 105* 111*  BUN 22* 20 11  CREATININE 0.90 0.77 0.83  CALCIUM 9.4 9.2 8.6*  MG  --  2.1  --    GFR: Estimated Creatinine Clearance: 70.9 mL/min (by C-G formula based on SCr of 0.83 mg/dL). Liver Function Tests: Recent Labs  Lab 08/10/18 1036  AST 20  ALT 14  ALKPHOS 79  BILITOT 0.5  PROT 8.1  ALBUMIN 3.6   Recent Labs  Lab 08/10/18 1036  LIPASE 27   No results for input(s): AMMONIA in the last 168 hours. Coagulation Profile: No results for input(s): INR, PROTIME in the last 168 hours. Cardiac Enzymes: No results for input(s): CKTOTAL, CKMB, CKMBINDEX, TROPONINI in the last 168 hours. BNP (last 3 results) No results for input(s): PROBNP in the last 8760 hours. HbA1C: No results for input(s): HGBA1C in the last 72 hours. CBG: No results for  input(s): GLUCAP in the last 168 hours. Lipid Profile: No results for input(s): CHOL, HDL, LDLCALC, TRIG, CHOLHDL, LDLDIRECT in the last 72 hours. Thyroid Function Tests: No results for input(s): TSH, T4TOTAL, FREET4, T3FREE, THYROIDAB in the last 72 hours. Anemia Panel: No results for input(s): VITAMINB12, FOLATE, FERRITIN, TIBC, IRON, RETICCTPCT in the last 72 hours. Urine analysis:    Component Value Date/Time   COLORURINE YELLOW 08/10/2018 1230   APPEARANCEUR CLEAR 08/10/2018 1230   LABSPEC >1.030 (H) 08/10/2018 1230   PHURINE 5.0 08/10/2018 1230   GLUCOSEU NEGATIVE 08/10/2018 1230   HGBUR LARGE (A) 08/10/2018 1230   BILIRUBINUR SMALL (A) 08/10/2018 1230   KETONESUR NEGATIVE 08/10/2018 1230   PROTEINUR NEGATIVE 08/10/2018 1230   NITRITE NEGATIVE 08/10/2018 1230   LEUKOCYTESUR TRACE (A) 08/10/2018 1230   Sepsis Labs: @LABRCNTIP (procalcitonin:4,lacticidven:4)  )No results found for this or any previous visit (from the past 240 hour(s)).    Radiology Studies: Ct Abdomen Pelvis Wo Contrast  Result Date: 08/10/2018 CLINICAL DATA:  57 year old female with acute LEFT abdominal and pelvic pain. History of lupus. EXAM: CT ABDOMEN AND PELVIS WITHOUT CONTRAST TECHNIQUE: Multidetector CT  imaging of the abdomen and pelvis was performed following the standard protocol without IV contrast, due to allergy. COMPARISON:  None. FINDINGS: Please note that parenchymal abnormalities may be missed without intravenous contrast. Lower chest: No acute abnormality. No pulmonary nodules identified in the lung bases. Hepatobiliary: The liver is unremarkable. Multiple gallstones are present without CT evidence of acute cholecystitis or biliary dilatation. Pancreas: Unremarkable Spleen: Unremarkable Adrenals/Urinary Tract: Moderate LEFT hydroureteronephrosis is caused by a ill-defined LEFT pelvic mass which will be discussed. The RIGHT kidney, adrenal glands and bladder are unremarkable. Stomach/Bowel: Diffuse  wall thickening of the proximal/mid sigmoid colon noted with adjacent stranding. A 3 x 4.5 cm ill-defined soft tissue structure in the LEFT iliac region noted (series 2: Image 53), causing LEFT hydronephrosis. This soft tissue structure lies close to but not immediately adjacent to the thick-walled sigmoid colon. No bowel obstruction, pneumoperitoneum or other definite collection. The appendix is normal. Extensive sigmoid colonic diverticula noted. Vascular/Lymphatic: Aortic atherosclerosis. No enlarged abdominal or pelvic lymph nodes. Reproductive: Uterus and bilateral adnexa are unremarkable. Other: No ascites. Musculoskeletal: No acute or suspicious bony abnormalities identified. Mild compression/Schmorl's nodes within the SUPERIOR endplates of L2 through L4 noted. Moderate degenerative disc disease at L4-5 and L5-S1 noted. No suspicious focal bony lesions identified. IMPRESSION: 1. Diffuse wall thickening of the proximal-mid sigmoid colon with adjacent stranding and with 3 x 4.5 cm ill-defined LEFT pelvic soft tissue structure. These findings are nonspecific but may represent acute diverticulitis with inflammatory/infectious LEFT pelvic process. Colonic neoplasm with LEFT pelvic adenopathy is not excluded however and clinical correlation and direct inspection follow-up recommended. 2. Moderate LEFT hydroureteronephrosis caused by the 3 x 4.5 cm ill-defined LEFT pelvic soft tissue structure. 3. Cholelithiasis without CT evidence of acute cholecystitis 4.  Aortic Atherosclerosis (ICD10-I70.0). Electronically Signed   By: Margarette Canada M.D.   On: 08/10/2018 14:14     Scheduled Meds: . escitalopram  20 mg Oral Daily  . gabapentin  800 mg Oral TID  . nicotine  14 mg Transdermal Daily   Continuous Infusions: . sodium chloride Stopped (08/10/18 1627)  . sodium chloride 10 mL/hr at 08/12/18 0300  . dextrose 5 % and 0.45 % NaCl with KCl 20 mEq/L 125 mL/hr at 08/12/18 0300  . piperacillin-tazobactam (ZOSYN)  IV  3.375 g (08/12/18 0537)     LOS: 2 days   Time Spent in minutes   30 minutes  Latanya Hemmer D.O. on 08/12/2018 at 12:27 PM  Between 7am to 7pm - Please see pager noted on [email or web address]  After 7pm go to [email or web address]  And look for the night coverage person covering for me after hours  [facility] Office  [phone]

## 2018-08-13 MED ORDER — SACCHAROMYCES BOULARDII 250 MG PO CAPS
250.0000 mg | ORAL_CAPSULE | Freq: Two times a day (BID) | ORAL | Status: DC
  Administered 2018-08-13 – 2018-08-14 (×3): 250 mg via ORAL
  Filled 2018-08-13 (×3): qty 1

## 2018-08-13 NOTE — Progress Notes (Addendum)
    CC: Abdominal pain  Subjective: Her pain is much better she still has some tenderness in the left lower quadrant on exam.  Feeling much better and is hungry.  Asking for a diet.  Objective: Vital signs in last 24 hours: Temp:  [98 F (36.7 C)-98.2 F (36.8 C)] 98 F (36.7 C) (11/26 0555) Pulse Rate:  [55-57] 55 (11/26 0555) Resp:  [16] 16 (11/26 0555) BP: (142-164)/(67-87) 142/67 (11/26 0555) SpO2:  [95 %-99 %] 95 % (11/26 0555) Last BM Date: 08/11/18 NO I/O recorded Afebrile, VSS Labs OK yesterday  Intake/Output from previous day: No intake/output data recorded. Intake/Output this shift: No intake/output data recorded.  General appearance: alert, cooperative and no distress Resp: clear to auscultation bilaterally GI: Soft, minimal tenderness in the left lower quadrant.  Tolerating clears well.  Lab Results:  Recent Labs    08/10/18 2226 08/12/18 0603  WBC 10.7* 5.2  HGB 14.6 12.5  HCT 45.8 40.2  PLT 351 281    BMET Recent Labs    08/10/18 2226 08/12/18 0603  NA 140 143  K 3.0* 4.2  CL 105 110  CO2 28 27  GLUCOSE 105* 111*  BUN 20 11  CREATININE 0.77 0.83  CALCIUM 9.2 8.6*   PT/INR No results for input(s): LABPROT, INR in the last 72 hours.  Recent Labs  Lab 08/10/18 1036  AST 20  ALT 14  ALKPHOS 79  BILITOT 0.5  PROT 8.1  ALBUMIN 3.6     Lipase     Component Value Date/Time   LIPASE 27 08/10/2018 1036     Medications: . escitalopram  20 mg Oral Daily  . gabapentin  800 mg Oral TID  . nicotine  14 mg Transdermal Daily    Assessment/Plan Anxiety Gall stones  Atherosclerosis  Lupus - skin condition of the face Smokes - 1/3 ppd  Recurrent diverticulitis with abscess;  antibiotic treatment, 06/2018, & 08/04/2018; admit 08/11/2018             -Not amenable to IR drainage             Left hydro-ureteronephrosis  FEN: IV fluids/n.p.o. ID: Zosyn 11/23 =>> day 2 DVT: SCDs only -may have anticoagulant from our  standpoint Follow-up: TBD PCP: Berkley Harvey, NP   Plan: Discussed with Dr. Ree Kida, and she is going to advance diet.  I will recheck labs in a.m.  If she is doing well we can advance her diet further and switch her over to oral Augmentin tomorrow.    Dr. Lucia Gaskins recommends a total of 2 weeks of antibiotics.  Would also recommend adding a probiotic.    She can follow-up with Dr. Ronnald Ramp her PCP.  We discussed the need for repeat imaging with Dr. Lucia Gaskins.  She had a colonoscopy about 2 years ago.  (She needs to confirm the date of the colonoscopy - it is somewhat unclear in her mind)   LOS: 3 days   JENNINGS,WILLARD 08/13/2018 [phone]  Agree with above. She looks good, minimal abdominal pain. She is also making an effort to quit smoking.  Alphonsa Overall, MD, [facility] Pager: [phone] Office phone:  [phone]

## 2018-08-13 NOTE — Progress Notes (Signed)
PROGRESS NOTE    Brittany Hobbs  OVF:[id number] DOB: 01-06-1961 DOA: 08/10/2018 PCP: Berkley Harvey, NP   Brief Narrative:  HPI On 08/10/2018 by Dr. Wells Guiles Johnson-Pitts Brittany Hobbs is a 57 y.o. female with medical history significant of lupus and anxiety presents with left lower quadrant pain.  Patient has had 2 bouts of diverticulitis over the past 2 months.  She was treated with p.o. Cipro and Flagyl per PCP x2.  Past few days has had increasing left lower quadrant pain and anorexia.  She had IV contrast dye allergy so was given p.o. contrast of the CT scan of her abdomen pelvis showed changes suggestive of a abscess.  This area was also causing left hydroureteronephrosis.  Patient was transferred here from [facility].  Per documentation Dr. Lucia Gaskins general surgery recommended IR for drainage.  Patient was hemodynamically stable here.  She is afebrile here.  No leukocytosis.  Interim history Admitted for abdominal pain likely secondary to recurrent diverticulitis with colonic diverticular abscess.  Interventional radiology consulted however unable to place drain.  General surgery consulted and pending further recommendations.  Assessment & Plan   Abdominal pain secondary to Recurrent Acute diverticulitis/Colonic diverticular abscess -CT abdomen pelvis showed diffuse wall thickening of the proximal-mid sigmoid colon with adjacent stranding, 3 x 4.5 cm ill-defined left pelvic soft tissue structure, findings are nonspecific but may represent acute diverticulitis with inflammatory/infectious left pelvic process.  Colonic neoplasm with left pelvic adenopathy is not excluded. -Patient was recently had episode in October and was treated.  She was also started on oral antibiotics on 08/04/2018 with ciprofloxacin and Flagyl however continues to have worsening symptoms. -Interventional radiology consulted and appreciated however no clear window for access to collection.  Recommended  antibiotic treatment and rescan in 5 to 7 days. -General surgery consulted and appreciated -Continue IV Zosyn -Patient was able to tolerate clear liquid diet, diet advanced today by general surgery -Suspect patient can go home Augmentin for an additional 2 weeks with repeat imaging.  Pending further recommendations from surgery.  Moderate left hydro-ureternephrosis -Secondary to the above, noted on CT scan -Continue to monitor intake and output  Asymptomatic bacteriuria -UA showed many bacteria, 6-10 WBC, trace leukocytes -Currently patient states she has no dysuria or foul-smelling urine -Will continue IV Zosyn for the above process  Hypokalemia -Resolved with replacement, continue to monitor BMP -Magnesium 2.1  Anxiety -Continue Lexapro and Ativan   DVT Prophylaxis  SCDs  Code Status: Full  Family Communication: Friend at bedside  Disposition Plan: Admitted. Pending improvement and further recommendations from general surgery  Consultants Interventional radiology General surgery  Procedures  None  Antibiotics   Anti-infectives (From admission, onward)   Start     Dose/Rate Route Frequency Ordered Stop   08/10/18 2300  piperacillin-tazobactam (ZOSYN) IVPB 3.375 g     3.375 g 12.5 mL/hr over 240 Minutes Intravenous Every 8 hours 08/10/18 2121     08/10/18 1545  piperacillin-tazobactam (ZOSYN) IVPB 3.375 g     3.375 g 100 mL/hr over 30 Minutes Intravenous  Once 08/10/18 1531 08/10/18 1626      Subjective:   Brittany Hobbs seen and examined today.  Feels abdominal pain has improved.  Like to eat solid food.  Denies current chest pain, shortness of breath, nausea, vomiting, diarrhea constipation, dizziness or headache.  Objective:   Vitals:   08/12/18 0610 08/12/18 1328 08/12/18 2107 08/13/18 0555  BP: (!) 151/73 (!) 164/74 (!) 143/87 Marland Kitchen)  142/67  Pulse: 60 (!) 55 (!) 57 (!) 55  Resp: 18 16 16 16   Temp: 98.1 F (36.7 C) 98 F (36.7 C) 98.2 F (36.8 C) 98  F (36.7 C)  TempSrc: Oral Oral Oral Oral  SpO2: 91% 99% 99% 95%  Weight:      Height:       No intake or output data in the 24 hours ending 08/13/18 1145 Filed Weights   08/10/18 1031  Weight: 65.8 kg   Exam  General: Well developed, well nourished, NAD, appears stated age  78: NCAT, mucous membranes moist.   Neck: Supple  Cardiovascular: S1 S2 auscultated, RRR, no murmur  Respiratory: Clear to auscultation bilaterally with equal chest rise  Abdomen: Soft, nontender, nondistended, + bowel sounds  Extremities: warm dry without cyanosis clubbing or edema  Neuro: AAOx3, nonfocal  Psych: Normal affect and demeanor, pleasant  Data Reviewed: I have personally reviewed following labs and imaging studies  CBC: Recent Labs  Lab 08/10/18 1036 08/10/18 2226 08/12/18 0603  WBC 8.4 10.7* 5.2  HGB 15.1* 14.6 12.5  HCT 47.6* 45.8 40.2  MCV 84.4 85.9 85.7  PLT 385 351 024   Basic Metabolic Panel: Recent Labs  Lab 08/10/18 1036 08/10/18 2226 08/12/18 0603  NA 140 140 143  K 3.3* 3.0* 4.2  CL 102 105 110  CO2 27 28 27   GLUCOSE 109* 105* 111*  BUN 22* 20 11  CREATININE 0.90 0.77 0.83  CALCIUM 9.4 9.2 8.6*  MG  --  2.1  --    GFR: Estimated Creatinine Clearance: 70.9 mL/min (by C-G formula based on SCr of 0.83 mg/dL). Liver Function Tests: Recent Labs  Lab 08/10/18 1036  AST 20  ALT 14  ALKPHOS 79  BILITOT 0.5  PROT 8.1  ALBUMIN 3.6   Recent Labs  Lab 08/10/18 1036  LIPASE 27   No results for input(s): AMMONIA in the last 168 hours. Coagulation Profile: No results for input(s): INR, PROTIME in the last 168 hours. Cardiac Enzymes: No results for input(s): CKTOTAL, CKMB, CKMBINDEX, TROPONINI in the last 168 hours. BNP (last 3 results) No results for input(s): PROBNP in the last 8760 hours. HbA1C: No results for input(s): HGBA1C in the last 72 hours. CBG: No results for input(s): GLUCAP in the last 168 hours. Lipid Profile: No results for  input(s): CHOL, HDL, LDLCALC, TRIG, CHOLHDL, LDLDIRECT in the last 72 hours. Thyroid Function Tests: No results for input(s): TSH, T4TOTAL, FREET4, T3FREE, THYROIDAB in the last 72 hours. Anemia Panel: No results for input(s): VITAMINB12, FOLATE, FERRITIN, TIBC, IRON, RETICCTPCT in the last 72 hours. Urine analysis:    Component Value Date/Time   COLORURINE YELLOW 08/10/2018 1230   APPEARANCEUR CLEAR 08/10/2018 1230   LABSPEC >1.030 (H) 08/10/2018 1230   PHURINE 5.0 08/10/2018 1230   GLUCOSEU NEGATIVE 08/10/2018 1230   HGBUR LARGE (A) 08/10/2018 1230   BILIRUBINUR SMALL (A) 08/10/2018 1230   KETONESUR NEGATIVE 08/10/2018 1230   PROTEINUR NEGATIVE 08/10/2018 1230   NITRITE NEGATIVE 08/10/2018 1230   LEUKOCYTESUR TRACE (A) 08/10/2018 1230   Sepsis Labs: @LABRCNTIP (procalcitonin:4,lacticidven:4)  )No results found for this or any previous visit (from the past 240 hour(s)).    Radiology Studies: No results found.   Scheduled Meds: . escitalopram  20 mg Oral Daily  . gabapentin  800 mg Oral TID  . nicotine  14 mg Transdermal Daily   Continuous Infusions: . sodium chloride Stopped (08/10/18 1627)  . sodium chloride 10 mL/hr at 08/12/18 0300  .  dextrose 5 % and 0.45 % NaCl with KCl 20 mEq/L 125 mL/hr at 08/13/18 0436  . piperacillin-tazobactam (ZOSYN)  IV 3.375 g (08/13/18 0559)     LOS: 3 days   Time Spent in minutes   30 minutes  Rhythm Gubbels D.O. on 08/13/2018 at 11:45 AM  Between 7am to 7pm - Please see pager noted on [email or web address]  After 7pm go to [email or web address]  And look for the night coverage person covering for me after hours  [facility] Office  [phone]

## 2018-08-14 DIAGNOSIS — E785 Hyperlipidemia, unspecified: Secondary | ICD-10-CM

## 2018-08-14 DIAGNOSIS — N1330 Unspecified hydronephrosis: Secondary | ICD-10-CM

## 2018-08-14 DIAGNOSIS — I159 Secondary hypertension, unspecified: Secondary | ICD-10-CM

## 2018-08-14 DIAGNOSIS — F419 Anxiety disorder, unspecified: Secondary | ICD-10-CM

## 2018-08-14 DIAGNOSIS — R8271 Bacteriuria: Secondary | ICD-10-CM

## 2018-08-14 DIAGNOSIS — K5720 Diverticulitis of large intestine with perforation and abscess without bleeding: Principal | ICD-10-CM

## 2018-08-14 LAB — CBC
HCT: 44.7 % (ref 36.0–46.0)
Hemoglobin: 14 g/dL (ref 12.0–15.0)
MCH: 27.2 pg (ref 26.0–34.0)
MCHC: 31.3 g/dL (ref 30.0–36.0)
MCV: 86.8 fL (ref 80.0–100.0)
NRBC: 0 % (ref 0.0–0.2)
PLATELETS: 334 10*3/uL (ref 150–400)
RBC: 5.15 MIL/uL — ABNORMAL HIGH (ref 3.87–5.11)
RDW: 14.7 % (ref 11.5–15.5)
WBC: 5.3 10*3/uL (ref 4.0–10.5)

## 2018-08-14 MED ORDER — SACCHAROMYCES BOULARDII 250 MG PO CAPS: 250.0000 mg | ORAL_CAPSULE | Freq: Two times a day (BID) | ORAL | 0 refills | Status: DC

## 2018-08-14 MED ORDER — NICOTINE 14 MG/24HR TD PT24: 14.0000 mg | MEDICATED_PATCH | Freq: Every day | TRANSDERMAL | 0 refills | Status: DC

## 2018-08-14 MED ORDER — AMOXICILLIN-POT CLAVULANATE 875-125 MG PO TABS
1.0000 | ORAL_TABLET | Freq: Two times a day (BID) | ORAL | Status: DC
  Administered 2018-08-14: 1 via ORAL
  Filled 2018-08-14: qty 1

## 2018-08-14 MED ORDER — POLYETHYLENE GLYCOL 3350 17 G PO PACK: 17.0000 g | PACK | Freq: Every day | ORAL | 0 refills | Status: DC | PRN

## 2018-08-14 MED ORDER — AMOXICILLIN-POT CLAVULANATE 875-125 MG PO TABS: 1.0000 | ORAL_TABLET | Freq: Two times a day (BID) | ORAL | 0 refills | Status: AC

## 2018-08-14 NOTE — Care Management Note (Signed)
Case Management Note  Patient Details  Name: Brittany Hobbs MRN: [id number] Date of Birth: 24-Mar-1961  Subjective/Objective:                  discharged  Action/Plan: Discharged to home with self-care, orders checked for hhc needs. No CM needs present at time of discharge.  Expected Discharge Date:  08/14/18               Expected Discharge Plan:  Home/Self Care  In-House Referral:     Discharge planning Services  CM Consult  Post Acute Care Choice:    Choice offered to:     DME Arranged:    DME Agency:     HH Arranged:    HH Agency:     Status of Service:  Completed, signed off  If discussed at [employer] of Stay Meetings, dates discussed:    Additional Comments:  Leeroy Cha, RN 08/14/2018, 11:37 AM

## 2018-08-14 NOTE — Discharge Instructions (Signed)
Diverticulitis °Diverticulitis is infection or inflammation of small pouches (diverticula) in the colon that form due to a condition called diverticulosis. Diverticula can trap stool (feces) and bacteria, causing infection and inflammation. °Diverticulitis may cause severe stomach pain and diarrhea. It may lead to tissue damage in the colon that causes bleeding. The diverticula may also burst (rupture) and cause infected stool to enter other areas of the abdomen. °Complications of diverticulitis can include: °· Bleeding. °· Severe infection. °· Severe pain. °· Rupture (perforation) of the colon. °· Blockage (obstruction) of the colon. ° °What are the causes? °This condition is caused by stool becoming trapped in the diverticula, which allows bacteria to grow in the diverticula. This leads to inflammation and infection. °What increases the risk? °You are more likely to develop this condition if: °· You have diverticulosis. The risk for diverticulosis increases if: °? You are overweight or obese. °? You use tobacco products. °? You do not get enough exercise. °· You eat a diet that does not include enough fiber. High-fiber foods include fruits, vegetables, beans, nuts, and whole grains. ° °What are the signs or symptoms? °Symptoms of this condition may include: °· Pain and tenderness in the abdomen. The pain is normally located on the left side of the abdomen, but it may occur in other areas. °· Fever and chills. °· Bloating. °· Cramping. °· Nausea. °· Vomiting. °· Changes in bowel routines. °· Blood in your stool. ° °How is this diagnosed? °This condition is diagnosed based on: °· Your medical history. °· A physical exam. °· Tests to make sure there is nothing else causing your condition. These tests may include: °? Blood tests. °? Urine tests. °? Imaging tests of the abdomen, including X-rays, ultrasounds, MRIs, or CT scans. ° °How is this treated? °Most cases of this condition are mild and can be treated at home.  Treatment may include: °· Taking over-the-counter pain medicines. °· Following a clear liquid diet. °· Taking antibiotic medicines by mouth. °· Rest. ° °More severe cases may need to be treated at a hospital. Treatment may include: °· Not eating or drinking. °· Taking prescription pain medicine. °· Receiving antibiotic medicines through an IV tube. °· Receiving fluids and nutrition through an IV tube. °· Surgery. ° °When your condition is under control, your health care provider may recommend that you have a colonoscopy. This is an exam to look at the entire large intestine. During the exam, a lubricated, bendable tube is inserted into the anus and then passed into the rectum, colon, and other parts of the large intestine. A colonoscopy can show how severe your diverticula are and whether something else may be causing your symptoms. °Follow these instructions at home: °Medicines °· Take over-the-counter and prescription medicines only as told by your health care provider. These include fiber supplements, probiotics, and stool softeners. °· If you were prescribed an antibiotic medicine, take it as told by your health care provider. Do not stop taking the antibiotic even if you start to feel better. °· Do not drive or use heavy machinery while taking prescription pain medicine. °General instructions °· Follow a full liquid diet or another diet as directed by your health care provider. After your symptoms improve, your health care provider may tell you to change your diet. He or she may recommend that you eat a diet that contains at least 25 g (25 grams) of fiber daily. Fiber makes it easier to pass stool. Healthy sources of fiber include: °? Berries. One cup   contains 4-8 grams of fiber. ? Beans or lentils. One half cup contains 5-8 grams of fiber. ? Green vegetables. One cup contains 4 grams of fiber.  Exercise for at least 30 minutes, 3 times each week. You should exercise hard enough to raise your heart rate and  break a sweat.  Keep all follow-up visits as told by your health care provider. This is important. You may need a colonoscopy. Contact a health care provider if:  Your pain does not improve.  You have a hard time drinking or eating food.  Your bowel movements do not return to normal. Get help right away if:  Your pain gets worse.  Your symptoms do not get better with treatment.  Your symptoms suddenly get worse.  You have a fever.  You vomit more than one time.  You have stools that are bloody, black, or tarry. Summary  Diverticulitis is infection or inflammation of small pouches (diverticula) in the colon that form due to a condition called diverticulosis. Diverticula can trap stool (feces) and bacteria, causing infection and inflammation.  You are at higher risk for this condition if you have diverticulosis and you eat a diet that does not include enough fiber.  Most cases of this condition are mild and can be treated at home. More severe cases may need to be treated at a hospital.  When your condition is under control, your health care provider may recommend that you have an exam called a colonoscopy. This exam can show how severe your diverticula are and whether something else may be causing your symptoms. This information is not intended to replace advice given to you by your health care provider. Make sure you discuss any questions you have with your health care provider. Document Released: 06/14/2005 Document Revised: 10/07/2016 Document Reviewed: 10/07/2016 Elsevier Interactive Patient Education  2018 [place] for at least one more week A soft-food meal plan includes foods that are safe and easy to swallow. This meal plan typically is used:  If you are having trouble chewing or swallowing foods.  As a transition meal plan after only having had liquid meals for a long period.  What do I need to know about the soft-food meal plan? A  soft-food meal plan includes tender foods that are soft and easy to chew and swallow. In most cases, bite-sized pieces of food are easier to swallow. A bite-sized piece is about  inch or smaller. Foods in this plan do not need to be ground or pureed. Foods that are very hard, crunchy, or sticky should be avoided. Also, breads, cereals, yogurts, and desserts with nuts, seeds, or fruits should be avoided. What foods can I eat? Grains Rice and wild rice. Moist bread, dressing, pasta, and noodles. Well-moistened dry or cooked cereals, such as farina (cooked wheat cereal), oatmeal, or grits. Biscuits, breads, muffins, pancakes, and waffles that have been well moistened. Vegetables Shredded lettuce. Cooked, tender vegetables, including potatoes without skins. Vegetable juices. Broths or creamed soups made with vegetables that are not stringy or chewy. Strained tomatoes (without seeds). Fruits Canned or well-cooked fruits. Soft (ripe), peeled fresh fruits, such as peaches, nectarines, kiwi, cantaloupe, honeydew melon, and watermelon (without seeds). Soft berries with small seeds, such as strawberries. Fruit juices (without pulp). Meats and Other Protein Sources Moist, tender, lean beef. Mutton. Lamb. Veal. Chicken. [place]. Liver. Ham. Fish without bones. Eggs. Dairy Milk, milk drinks, and cream. Plain cream cheese and cottage cheese. Plain yogurt. Sweets/Desserts Flavored gelatin  desserts. Custard. Plain ice cream, frozen yogurt, sherbet, milk shakes, and malts. Plain cakes and cookies. Plain hard candy. Other Butter, margarine (without trans fat), and cooking oils. Mayonnaise. Cream sauces. Mild spices, salt, and sugar. Syrup, molasses, honey, and jelly. The items listed above may not be a complete list of recommended foods or beverages. Contact your dietitian for more options. What foods are not recommended? Grains Dry bread, toast, crackers that have not been moistened. Coarse or dry cereals, such  as bran, granola, and shredded wheat. Tough or chewy crusty breads, such as [place] bread or baguettes. Vegetables Corn. Raw vegetables except shredded lettuce. Cooked vegetables that are tough or stringy. Tough, crisp, fried potatoes and potato skins. Fruits Fresh fruits with skins or seeds or both, such as apples, pears, or grapes. Stringy, high-pulp fruits, such as papaya, pineapple, coconut, or mango. Fruit leather, fruit roll-ups, and all dried fruits. Meats and Other Protein Sources Sausages and hot dogs. Meats with gristle. Fish with bones. Nuts, seeds, and chunky peanut or other nut butters. Sweets/Desserts Cakes or cookies that are very dry or chewy. The items listed above may not be a complete list of foods and beverages to avoid. Contact your dietitian for more information. This information is not intended to replace advice given to you by your health care provider. Make sure you discuss any questions you have with your health care provider. Document Released: 12/12/2007 Document Revised: 02/10/2016 Document Reviewed: 08/01/2013 Elsevier Interactive Patient Education  2017 [place].   High-Fiber Diet - start in about 7 days Fiber, also called dietary fiber, is a type of carbohydrate found in fruits, vegetables, whole grains, and beans. A high-fiber diet can have many health benefits. Your health care provider may recommend a high-fiber diet to help:  Prevent constipation. Fiber can make your bowel movements more regular.  Lower your cholesterol.  Relieve hemorrhoids, uncomplicated diverticulosis, or irritable bowel syndrome.  Prevent overeating as part of a weight-loss plan.  Prevent heart disease, type 2 diabetes, and certain cancers.  What is my plan? The recommended daily intake of fiber includes:  38 grams for men under age 104.  32 grams for men over age 54.  33 grams for women under age 56.  20 grams for women over age 58.  You can get the recommended daily  intake of dietary fiber by eating a variety of fruits, vegetables, grains, and beans. Your health care provider may also recommend a fiber supplement if it is not possible to get enough fiber through your diet. What do I need to know about a high-fiber diet?  Fiber supplements have not been widely studied for their effectiveness, so it is better to get fiber through food sources.  Always check the fiber content on thenutrition facts label of any prepackaged food. Look for foods that contain at least 5 grams of fiber per serving.  Ask your dietitian if you have questions about specific foods that are related to your condition, especially if those foods are not listed in the following section.  Increase your daily fiber consumption gradually. Increasing your intake of dietary fiber too quickly may cause bloating, cramping, or gas.  Drink plenty of water. Water helps you to digest fiber. What foods can I eat? Grains Whole-grain breads. Multigrain cereal. Oats and oatmeal. Brown rice. Barley. Bulgur wheat. [place]. Bran muffins. Popcorn. Rye wafer crackers. Vegetables Sweet potatoes. Spinach. Kale. Artichokes. Cabbage. Broccoli. Green peas. Carrots. Squash. Fruits Berries. Pears. Apples. Oranges. Avocados. Prunes and raisins. Dried figs.  Meats and Other Protein Sources [place], kidney, pinto, and soy beans. Split peas. Lentils. Nuts and seeds. Dairy Fiber-fortified yogurt. Beverages Fiber-fortified soy milk. Fiber-fortified orange juice. Other Fiber bars. The items listed above may not be a complete list of recommended foods or beverages. Contact your dietitian for more options. What foods are not recommended? Grains White bread. Pasta made with refined flour. White rice. Vegetables Fried potatoes. Canned vegetables. Well-cooked vegetables. Fruits Fruit juice. Cooked, strained fruit. Meats and Other Protein Sources Fatty cuts of meat. Fried Sales executive or fried fish. Dairy Milk. Yogurt.  Cream cheese. Sour cream. Beverages Soft drinks. Other Cakes and pastries. Butter and oils. The items listed above may not be a complete list of foods and beverages to avoid. Contact your dietitian for more information. What are some tips for including high-fiber foods in my diet?  Eat a wide variety of high-fiber foods.  Make sure that half of all grains consumed each day are whole grains.  Replace breads and cereals made from refined flour or white flour with whole-grain breads and cereals.  Replace white rice with brown rice, bulgur wheat, or millet.  Start the day with a breakfast that is high in fiber, such as a cereal that contains at least 5 grams of fiber per serving.  Use beans in place of meat in soups, salads, or pasta.  Eat high-fiber snacks, such as berries, raw vegetables, nuts, or popcorn. This information is not intended to replace advice given to you by your health care provider. Make sure you discuss any questions you have with your health care provider. Document Released: 09/04/2005 Document Revised: 02/10/2016 Document Reviewed: 02/17/2014 Elsevier Interactive Patient Education  [employer].

## 2018-08-14 NOTE — Discharge Summary (Signed)
Physician Discharge Summary  Brittany Hobbs EGB:[id number] DOB: 19-Nov-1960 DOA: 08/10/2018  PCP: Brittany Harvey, NP  Admit date: 08/10/2018 Discharge date: 08/14/2018  Admitted From: Home Disposition: Home  Recommendations for Outpatient Follow-up:  1. Follow up with PCP in 1-2 weeks 2. Follow up with General Surgery in 1-2 weeks 3. Repeat CT Abdomen and Pelvis within 5-7 days to evaluate 3 x 4.5 cm ill defined Left Pelvic Soft Tissue Structure 4. Follow up with Urology for Left Hydro-Uteternephrosis 5. Please obtain CMP/CBC, Mag, Phos in one week 6. Please follow up on the following pending results:  Home Health: No  Equipment/Devices: None   Discharge Condition: Stable CODE STATUS: FULL CODE Diet recommendation: Heart Healthy Soft  Brief/Interim Summary: HPI On 08/10/2018 by Brittany Hobbs. [name] a 57 y.o.femalewith medical history significant oflupus and anxiety presents with left lower quadrant pain. Patient has had 2 bouts of diverticulitis over the past 2 months. She was treated with p.o. Cipro and Flagyl per PCP x2. Past few days has had increasing left lower quadrant pain and anorexia. She had IV contrast dye allergy so was given p.o. contrast of the CT scan of her abdomen pelvis showed changes suggestive of a abscess. This area was also causing left hydroureteronephrosis. Patient was transferred here from [facility]. Per documentation Dr. Lucia Hobbs general surgery recommended IR for drainage. Patient was hemodynamically stable here. She is afebrile here. No leukocytosis.  Interim history Admitted for abdominal pain likely secondary to recurrent diverticulitis with colonic diverticular abscess.  Interventional radiology consulted however unable to place drain.  General surgery consulted and pending further recommendations and recommended changing IV zosyn to po Augmentin and D/Cing home as symptoms improved and as she was  tolerating diet. She will need a re-scan of her Abdomen and Pelvis and follow up with PCP within 1 week.    Discharge Diagnoses:  Principal Problem:   Colonic diverticular abscess Active Problems:   Lupus ([place])   Hyperlipidemia   HTN (hypertension)   Anxiety   Bacteriuria   Hydronephrosis, left  Abdominal pain secondary to Recurrent Acute diverticulitis/Colonic diverticular abscess -CT abdomen pelvis showed diffuse wall thickening of the proximal-mid sigmoid colon with adjacent stranding, 3 x 4.5 cm ill-defined left pelvic soft tissue structure, findings are nonspecific but may represent acute diverticulitis with inflammatory/infectious left pelvic process.  Colonic neoplasm with left pelvic adenopathy is not excluded. -Patient was recently had episode in October and was treated.  She was also started on oral antibiotics on 08/04/2018 with ciprofloxacin and Flagyl however continues to have worsening symptoms. -Interventional radiology consulted and appreciated however no clear window for access to collection.  Recommended antibiotic treatment and rescan in 5 to 7 days. -General surgery consulted and appreciated -Continuef IV Zosyn and changed to po Augmentin  -Patient was able to tolerate clear liquid diet and patient tolerated advancement in diet -General Surgery recommended patient can go home Augmentin for total of 2 weeks with repeat imaging.  Pending further recommendations from surgery.  Moderate left hydro-ureternephrosis -Secondary to the above 3 x 4.5 cm ill-defined LEFT pelvic soft tissue structure , noted on CT scan -Continue to monitor intake and output -Follow up with Urology as an outpatient   Asymptomatic bacteriuria -UA showed many bacteria, 6-10 WBC, trace leukocytes -Currently patient states she has no dysuria or foul-smelling urine -Will continue IV Zosyn for the above process and change to po Augmentin   Hypokalemia -Resolved with replacement and was 4.2   -  Magnesium was 2.1 -Repeat CMP as an outpatient   Anxiety -Continue Lexapro and Ativan   Discharge Instructions  Discharge Instructions    Call MD for:  difficulty breathing, headache or visual disturbances   Complete by:  As directed    Call MD for:  extreme fatigue   Complete by:  As directed    Call MD for:  hives   Complete by:  As directed    Call MD for:  persistant dizziness or light-headedness   Complete by:  As directed    Call MD for:  persistant nausea and vomiting   Complete by:  As directed    Call MD for:  redness, tenderness, or signs of infection (pain, swelling, redness, odor or green/yellow discharge around incision site)   Complete by:  As directed    Call MD for:  severe uncontrolled pain   Complete by:  As directed    Call MD for:  temperature >100.4   Complete by:  As directed    Diet - low sodium heart healthy   Complete by:  As directed    Discharge instructions   Complete by:  As directed    You were cared for by a hospitalist during your hospital stay. If you have any questions about your discharge medications or the care you received while you were in the hospital after you are discharged, you can call the unit and ask to speak with the hospitalist on call if the hospitalist that took care of you is not available. Once you are discharged, your primary care physician will handle any further medical issues. Please note that NO REFILLS for any discharge medications will be authorized once you are discharged, as it is imperative that you return to your primary care physician (or establish a relationship with a primary care physician if you do not have one) for your aftercare needs so that they can reassess your need for medications and monitor your lab values.  Follow up with PCP and General Surgery. Take all medications as prescribed. If symptoms change or worsen please return to the ED for evaluation   Increase activity slowly   Complete by:  As directed       Allergies as of 08/14/2018      Reactions   Iodine Anaphylaxis   Pt states she is allergic to INTERNAL IODINE   Betadine [povidone Iodine] Hives   Sulfa Antibiotics    N/V      Medication List    TAKE these medications   amoxicillin-clavulanate 875-125 MG tablet Commonly known as:  AUGMENTIN Take 1 tablet by mouth every 12 (twelve) hours for 10 days.   aspirin EC 81 MG tablet Take 81 mg by mouth daily.   diphenhydrAMINE 50 MG tablet Commonly known as:  BENADRYL Take 50 mg by mouth at bedtime.   escitalopram 20 MG tablet Commonly known as:  LEXAPRO Take 20 mg by mouth daily.   fluticasone 50 MCG/ACT nasal spray Commonly known as:  FLONASE Place 1 spray into both nostrils as needed for allergies.   gabapentin 400 MG capsule Commonly known as:  NEURONTIN Take 800 mg by mouth 3 (three) times daily.   HYDROcodone-acetaminophen 5-325 MG tablet Commonly known as:  NORCO/VICODIN Take 1 tablet by mouth every 6 (six) hours as needed for moderate pain.   LORazepam 1 MG tablet Commonly known as:  ATIVAN Take 1 mg by mouth 3 (three) times daily as needed for sleep.   nicotine 14 mg/24hr patch  Commonly known as:  NICODERM CQ - dosed in mg/24 hours Place 1 patch (14 mg total) onto the skin daily. Start taking on:  08/15/2018   polyethylene glycol packet Commonly known as:  MIRALAX / GLYCOLAX Take 17 g by mouth daily as needed for mild constipation.   saccharomyces boulardii 250 MG capsule Commonly known as:  FLORASTOR Take 1 capsule (250 mg total) by mouth 2 (two) times daily.   SOLUBLE FIBER/PROBIOTICS PO Take by mouth daily.      Follow-up Information    Brittany Harvey, NP Follow up.   Specialty:  Nurse Practitioner Why:  call and let them know what happened. Contact information: [place] [address] [phone]        Alphonsa Overall, MD Follow up on 09/20/2018.   Specialty:  General Surgery Why:  Your appointment is at  2:45 PM.  At the office 30 minutes early for check-in.  Bring photo ID and insurance information. Contact information: [address] [place]  [address] [phone]          Allergies  Allergen Reactions  . Iodine Anaphylaxis    Pt states she is allergic to INTERNAL IODINE  . Betadine [Povidone Iodine] Hives  . Sulfa Antibiotics     N/V   Consultations:  General Surgery  IR  Procedures/Studies: Ct Abdomen Pelvis Wo Contrast  Result Date: 08/10/2018 CLINICAL DATA:  57 year old female with acute LEFT abdominal and pelvic pain. History of lupus. EXAM: CT ABDOMEN AND PELVIS WITHOUT CONTRAST TECHNIQUE: Multidetector CT imaging of the abdomen and pelvis was performed following the standard protocol without IV contrast, due to allergy. COMPARISON:  None. FINDINGS: Please note that parenchymal abnormalities may be missed without intravenous contrast. Lower chest: No acute abnormality. No pulmonary nodules identified in the lung bases. Hepatobiliary: The liver is unremarkable. Multiple gallstones are present without CT evidence of acute cholecystitis or biliary dilatation. Pancreas: Unremarkable Spleen: Unremarkable Adrenals/Urinary Tract: Moderate LEFT hydroureteronephrosis is caused by a ill-defined LEFT pelvic mass which will be discussed. The RIGHT kidney, adrenal glands and bladder are unremarkable. Stomach/Bowel: Diffuse wall thickening of the proximal/mid sigmoid colon noted with adjacent stranding. A 3 x 4.5 cm ill-defined soft tissue structure in the LEFT iliac region noted (series 2: Image 53), causing LEFT hydronephrosis. This soft tissue structure lies close to but not immediately adjacent to the thick-walled sigmoid colon. No bowel obstruction, pneumoperitoneum or other definite collection. The appendix is normal. Extensive sigmoid colonic diverticula noted. Vascular/Lymphatic: Aortic atherosclerosis. No enlarged abdominal or pelvic lymph nodes. Reproductive: Uterus and  bilateral adnexa are unremarkable. Other: No ascites. Musculoskeletal: No acute or suspicious bony abnormalities identified. Mild compression/Schmorl's nodes within the SUPERIOR endplates of L2 through L4 noted. Moderate degenerative disc disease at L4-5 and L5-S1 noted. No suspicious focal bony lesions identified. IMPRESSION: 1. Diffuse wall thickening of the proximal-mid sigmoid colon with adjacent stranding and with 3 x 4.5 cm ill-defined LEFT pelvic soft tissue structure. These findings are nonspecific but may represent acute diverticulitis with inflammatory/infectious LEFT pelvic process. Colonic neoplasm with LEFT pelvic adenopathy is not excluded however and clinical correlation and direct inspection follow-up recommended. 2. Moderate LEFT hydroureteronephrosis caused by the 3 x 4.5 cm ill-defined LEFT pelvic soft tissue structure. 3. Cholelithiasis without CT evidence of acute cholecystitis 4.  Aortic Atherosclerosis (ICD10-I70.0). Electronically Signed   By: Margarette Canada M.D.   On: 08/10/2018 14:14    Subjective: Seen and examined at bedside was tolerating  diet well.  Denies any chest pain, lightheadedness or dizziness.  No nausea or vomiting.  No other concerns or complaints at this time is ready to go home.  Discharge Exam: Vitals:   08/13/18 2114 08/14/18 0519  BP: (!) 145/77 (!) 142/81  Pulse: (!) 56 (!) 51  Resp: 19 17  Temp: 98.7 F (37.1 C) 98.5 F (36.9 C)  SpO2: 95% 94%   Vitals:   08/13/18 0555 08/13/18 1404 08/13/18 2114 08/14/18 0519  BP: (!) 142/67 (!) 148/84 (!) 145/77 (!) 142/81  Pulse: (!) 55 (!) 55 (!) 56 (!) 51  Resp: 16 16 19 17   Temp: 98 F (36.7 C) 99 F (37.2 C) 98.7 F (37.1 C) 98.5 F (36.9 C)  TempSrc: Oral Oral    SpO2: 95% 97% 95% 94%  Weight:      Height:       General: Pt is alert, awake, not in acute distress Cardiovascular: RRR, S1/S2 +, no rubs, no gallops Respiratory: CTA bilaterally, no wheezing, no rhonchi Abdominal: Soft, Mildly tender,  ND, bowel sounds + Extremities: no edema, no cyanosis  The results of significant diagnostics from this hospitalization (including imaging, microbiology, ancillary and laboratory) are listed below for reference.    Microbiology: No results found for this or any previous visit (from the past 240 hour(s)).   Labs: BNP (last 3 results) No results for input(s): BNP in the last 8760 hours. Basic Metabolic Panel: Recent Labs  Lab 08/10/18 1036 08/10/18 2226 08/12/18 0603  NA 140 140 143  K 3.3* 3.0* 4.2  CL 102 105 110  CO2 27 28 27   GLUCOSE 109* 105* 111*  BUN 22* 20 11  CREATININE 0.90 0.77 0.83  CALCIUM 9.4 9.2 8.6*  MG  --  2.1  --    Liver Function Tests: Recent Labs  Lab 08/10/18 1036  AST 20  ALT 14  ALKPHOS 79  BILITOT 0.5  PROT 8.1  ALBUMIN 3.6   Recent Labs  Lab 08/10/18 1036  LIPASE 27   No results for input(s): AMMONIA in the last 168 hours. CBC: Recent Labs  Lab 08/10/18 1036 08/10/18 2226 08/12/18 0603 08/14/18 0611  WBC 8.4 10.7* 5.2 5.3  HGB 15.1* 14.6 12.5 14.0  HCT 47.6* 45.8 40.2 44.7  MCV 84.4 85.9 85.7 86.8  PLT 385 351 281 334   Cardiac Enzymes: No results for input(s): CKTOTAL, CKMB, CKMBINDEX, TROPONINI in the last 168 hours. BNP: Invalid input(s): POCBNP CBG: No results for input(s): GLUCAP in the last 168 hours. D-Dimer No results for input(s): DDIMER in the last 72 hours. Hgb A1c No results for input(s): HGBA1C in the last 72 hours. Lipid Profile No results for input(s): CHOL, HDL, LDLCALC, TRIG, CHOLHDL, LDLDIRECT in the last 72 hours. Thyroid function studies No results for input(s): TSH, T4TOTAL, T3FREE, THYROIDAB in the last 72 hours.  Invalid input(s): FREET3 Anemia work up No results for input(s): VITAMINB12, FOLATE, FERRITIN, TIBC, IRON, RETICCTPCT in the last 72 hours. Urinalysis    Component Value Date/Time   COLORURINE YELLOW 08/10/2018 1230   APPEARANCEUR CLEAR 08/10/2018 1230   LABSPEC >1.030 (H)  08/10/2018 1230   PHURINE 5.0 08/10/2018 1230   GLUCOSEU NEGATIVE 08/10/2018 1230   HGBUR LARGE (A) 08/10/2018 1230   BILIRUBINUR SMALL (A) 08/10/2018 1230   KETONESUR NEGATIVE 08/10/2018 1230   PROTEINUR NEGATIVE 08/10/2018 1230   NITRITE NEGATIVE 08/10/2018 1230   LEUKOCYTESUR TRACE (A) 08/10/2018 1230   Sepsis Labs Invalid input(s): PROCALCITONIN,  WBC,  [place] [facility] No results found for this or any previous visit (from the past 240 hour(s)).  Time coordinating discharge: 35 minutes  SIGNED:  Kerney Elbe, DO [facility] 08/14/2018, 7:35 PM Pager is on [place]  If 7PM-7AM, please contact night-coverage [email or web address] Password [email or web address]

## 2018-08-14 NOTE — Progress Notes (Addendum)
    CC: Diverticulitis  Subjective: Patient doing well this a.m. tolerating soft diet no pain.  Anxious to go home. Objective: Vital signs in last 24 hours: Temp:  [98.5 F (36.9 C)-99 F (37.2 C)] 98.5 F (36.9 C) (11/27 0519) Pulse Rate:  [51-56] 51 (11/27 0519) Resp:  [16-19] 17 (11/27 0519) BP: (142-148)/(77-84) 142/81 (11/27 0519) SpO2:  [94 %-97 %] 94 % (11/27 0519) Last BM Date: 08/13/18 ? I/O Afebrile, VSS WBC  5.3 Intake/Output from previous day: No intake/output data recorded. Intake/Output this shift: Total I/O In: 360 [P.O.:360] Out: -   General appearance: alert, cooperative and no distress Resp: clear to auscultation bilaterally GI: soft, non-tender; bowel sounds normal; no masses,  no organomegaly  Lab Results:  Recent Labs    08/12/18 0603 08/14/18 0611  WBC 5.2 5.3  HGB 12.5 14.0  HCT 40.2 44.7  PLT 281 334    BMET Recent Labs    08/12/18 0603  NA 143  K 4.2  CL 110  CO2 27  GLUCOSE 111*  BUN 11  CREATININE 0.83  CALCIUM 8.6*   PT/INR No results for input(s): LABPROT, INR in the last 72 hours.  Recent Labs  Lab 08/10/18 1036  AST 20  ALT 14  ALKPHOS 79  BILITOT 0.5  PROT 8.1  ALBUMIN 3.6     Lipase     Component Value Date/Time   LIPASE 27 08/10/2018 1036     Medications: . escitalopram  20 mg Oral Daily  . gabapentin  800 mg Oral TID  . nicotine  14 mg Transdermal Daily  . saccharomyces boulardii  250 mg Oral BID    Assessment/Plan Anxiety Gall stones Atherosclerosis Lupus - skin condition of the face Smokes - 1/3 ppd  Recurrent diverticulitis with abscess;antibiotic treatment, 06/2018, &08/04/2018;admit 08/11/2018 -Not amenable to IR drainage Left hydro-ureteronephrosis  FEN: IV fluids/n.p.o. ID: Zosyn 11/23=>> day 2 DVT: SCDs only-may have anticoagulant from our standpoint Follow-up: TBD PCP: Berkley Harvey, NP   Plan: Home today on oral Augmentin.  Dr.  Lucia Gaskins recommends a total 2-week course of antibiotics.  We have a follow-up appointment in the [facility] for her to see Dr. Lucia Gaskins in January of next year.      LOS: 4 days    JENNINGS,WILLARD 08/14/2018 [phone]  Agree with above.  Alphonsa Overall, MD, [facility] Pager: [phone] Office phone:  [phone]

## 2018-09-25 ENCOUNTER — Encounter: Payer: Self-pay | Admitting: Gastroenterology

## 2018-10-17 ENCOUNTER — Encounter: Payer: Self-pay | Admitting: Gastroenterology

## 2018-10-17 ENCOUNTER — Ambulatory Visit (AMBULATORY_SURGERY_CENTER): Payer: Self-pay

## 2018-10-17 VITALS — Ht 66.0 in | Wt 149.0 lb

## 2018-10-17 DIAGNOSIS — Z860100 Personal history of colon polyps, unspecified: Secondary | ICD-10-CM

## 2018-10-17 NOTE — Progress Notes (Signed)
Denies allergies to eggs or soy products. Denies complication of anesthesia or sedation. Denies use of weight loss medication. Denies use of O2.   Emmi instructions declined.   A 15.00 dollar coupon for Suprep was given to the patient,

## 2018-10-23 ENCOUNTER — Other Ambulatory Visit: Payer: Self-pay | Admitting: Gastroenterology

## 2018-10-23 MED ORDER — NA SULFATE-K SULFATE-MG SULF 17.5-3.13-1.6 GM/177ML PO SOLN: 1.0000 | Freq: Once | ORAL | 0 refills | Status: AC

## 2018-10-23 NOTE — Telephone Encounter (Signed)
Prep sent to [facility] as asked by pt.     Marie pV

## 2018-10-23 NOTE — Telephone Encounter (Signed)
Pt sched for colon with Dr. Fuller Plan 2.10.20.  Pt needs prep sent to [place].

## 2018-10-24 ENCOUNTER — Telehealth: Payer: Self-pay | Admitting: Gastroenterology

## 2018-10-24 MED ORDER — NA SULFATE-K SULFATE-MG SULF 17.5-3.13-1.6 GM/177ML PO SOLN: 1.0000 | Freq: Once | ORAL | 0 refills | Status: DC

## 2018-10-24 MED ORDER — NA SULFATE-K SULFATE-MG SULF 17.5-3.13-1.6 GM/177ML PO SOLN: 1.0000 | Freq: Once | ORAL | 0 refills | Status: AC

## 2018-10-24 NOTE — Telephone Encounter (Signed)
Suprep sent to [facility] [address] [facility] [place] - pt aware   Brittany Hobbs

## 2018-10-28 ENCOUNTER — Encounter: Payer: Self-pay | Admitting: Gastroenterology

## 2018-10-28 ENCOUNTER — Ambulatory Visit (AMBULATORY_SURGERY_CENTER): Payer: BLUE CROSS/BLUE SHIELD | Admitting: Gastroenterology

## 2018-10-28 VITALS — BP 125/86 | HR 69 | Temp 97.1°F | Resp 18 | Ht 66.0 in | Wt 149.0 lb

## 2018-10-28 DIAGNOSIS — Z86010 Personal history of colonic polyps: Secondary | ICD-10-CM | POA: Diagnosis present

## 2018-10-28 DIAGNOSIS — D125 Benign neoplasm of sigmoid colon: Secondary | ICD-10-CM

## 2018-10-28 DIAGNOSIS — K635 Polyp of colon: Secondary | ICD-10-CM

## 2018-10-28 MED ORDER — SODIUM CHLORIDE 0.9 % IV SOLN: 500.0000 mL | INTRAVENOUS | Status: DC

## 2018-10-28 NOTE — Op Note (Signed)
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 The patient's tolerance of                            previous anesthesia was also reviewed. The risks                            and benefits of the procedure and the sedation                            options and risks were discussed with the patient.                            All questions were answered, and informed consent                            was obtained. Prior Anticoagulants: The patient has                            taken no previous anticoagulant or antiplatelet                            agents. ASA Grade Assessment: II - A patient with                            mild systemic disease. After reviewing the risks                            and benefits, the patient was deemed in  satisfactory condition to undergo the procedure.                           After obtaining informed consent, the colonoscope                            was passed under direct vision. Throughout the                            procedure, the patient's blood pressure, pulse, and                            oxygen saturations were monitored continuously. The                            Colonoscope was introduced through the  anus and                            advanced to the the cecum, identified by                            appendiceal orifice and ileocecal valve. The                            ileocecal valve, appendiceal orifice, and rectum                            were photographed. The quality of the bowel                            preparation was good. The colonoscopy was performed                            without difficulty. The patient tolerated the                            procedure well. Scope In: 8:02:25 AM Scope Out: 8:19:16 AM Scope Withdrawal Time: 0 hours 13 minutes 36 seconds  Total Procedure Duration: 0 hours 16 minutes 51 seconds  Findings:                 The perianal and digital rectal examinations were                            normal.                           A 7 mm polyp was found in the sigmoid colon. The                            polyp was sessile. The polyp was removed with a                            cold snare. Resection and retrieval were complete.  Multiple medium-mouthed diverticula were found in                            the left colon. There was narrowing of the colon in                            association with the diverticular opening. There                            was evidence of diverticular spasm. There was                            evidence of an impacted diverticulum. There was no                            evidence of diverticular bleeding.                           Scattered small-mouthed diverticula were found in                            the right colon. There was no evidence of                            diverticular bleeding.                           Internal hemorrhoids were found during                            retroflexion. The hemorrhoids were small and Grade                            I (internal hemorrhoids that do not prolapse).                           The exam was otherwise without abnormality on                             direct and retroflexion views. Complications:            No immediate complications. Estimated blood loss:                            None. Estimated Blood Loss:     Estimated blood loss: none. Impression:               - One 7 mm polyp in the sigmoid colon, removed with                            a cold snare. Resected and retrieved.                           - Moderate diverticulosis in the left colon.                           -  Mild diveritulosis in the right colon.                           - Internal hemorrhoids.                           - The examination was otherwise normal on direct                            and retroflexion views. Recommendation:           - Repeat colonoscopy in 5 years for surveillance.                           - Patient has a contact number available for                            emergencies. The signs and symptoms of potential                            delayed complications were discussed with the                            patient. Return to normal activities tomorrow.                            Written discharge instructions were provided to the                            patient.                           - High fiber diet long term.                           - Continue present medications.                           - Await pathology results. Ladene Artist, MD 10/28/2018 8:25:02 AM This report has been signed electronically.

## 2018-10-28 NOTE — Progress Notes (Signed)
Pt's states no medical or surgical changes since previsit or office visit. 

## 2018-10-28 NOTE — Progress Notes (Signed)
A and O x3. Report to RN. Tolerated MAC anesthesia well.

## 2018-10-28 NOTE — Patient Instructions (Signed)
Handouts given per MD's report- Polyps, Diverticulosis, and Hemorrhoids  High Fiber Diet recommended and Repeat colonoscopy in 5 years.  YOU HAD AN ENDOSCOPIC PROCEDURE TODAY AT [facility]:   Refer to the procedure report that was given to you for any specific questions about what was found during the examination.  If the procedure report does not answer your questions, please call your gastroenterologist to clarify.  If you requested that your care partner not be given the details of your procedure findings, then the procedure report has been included in a sealed envelope for you to review at your convenience later.  YOU SHOULD EXPECT: Some feelings of bloating in the abdomen. Passage of more gas than usual.  Walking can help get rid of the air that was put into your GI tract during the procedure and reduce the bloating. If you had a lower endoscopy (such as a colonoscopy or flexible sigmoidoscopy) you may notice spotting of blood in your stool or on the toilet paper. If you underwent a bowel prep for your procedure, you may not have a normal bowel movement for a few days.  Please Note:  You might notice some irritation and congestion in your nose or some drainage.  This is from the oxygen used during your procedure.  There is no need for concern and it should clear up in a day or so.  SYMPTOMS TO REPORT IMMEDIATELY:   Following lower endoscopy (colonoscopy or flexible sigmoidoscopy):  Excessive amounts of blood in the stool  Significant tenderness or worsening of abdominal pains  Swelling of the abdomen that is new, acute  Fever of 100F or higher  For urgent or emergent issues, a gastroenterologist can be reached at any hour by calling [phone].   DIET:  We do recommend a small meal at first, but then you may proceed to your regular diet.  Drink plenty of fluids but you should avoid alcoholic beverages for 24 hours.  ACTIVITY:  You should plan to take it easy for  the rest of today and you should NOT DRIVE or use heavy machinery until tomorrow (because of the sedation medicines used during the test).    FOLLOW UP: Our staff will call the number listed on your records the next business day following your procedure to check on you and address any questions or concerns that you may have regarding the information given to you following your procedure. If we do not reach you, we will leave a message.  However, if you are feeling well and you are not experiencing any problems, there is no need to return our call.  We will assume that you have returned to your regular daily activities without incident.  If any biopsies were taken you will be contacted by phone or by letter within the next 1-3 weeks.  Please call us at [phone] if you have not heard about the biopsies in 3 weeks.    SIGNATURES/CONFIDENTIALITY: You and/or your care partner have signed paperwork which will be entered into your electronic medical record.  These signatures attest to the fact that that the information above on your After Visit Summary has been reviewed and is understood.  Full responsibility of the confidentiality of this discharge information lies with you and/or your care-partner.

## 2018-10-28 NOTE — Progress Notes (Signed)
Called to room to assist during endoscopic procedure.  Patient ID and intended procedure confirmed with present staff. Received instructions for my participation in the procedure from the performing physician.  

## 2018-10-29 ENCOUNTER — Telehealth: Payer: Self-pay

## 2018-10-29 NOTE — Telephone Encounter (Signed)
No answer, left message to call back later today, B.Tyronica Truxillo RN. 

## 2018-10-29 NOTE — Telephone Encounter (Signed)
Second post procedure follow up call, no answer 

## 2018-11-13 ENCOUNTER — Encounter: Payer: Self-pay | Admitting: Gastroenterology

## 2019-09-29 ENCOUNTER — Other Ambulatory Visit: Payer: Self-pay

## 2019-09-29 ENCOUNTER — Emergency Department (HOSPITAL_COMMUNITY)
Admission: EM | Admit: 2019-09-29 | Discharge: 2019-09-29 | Disposition: A | Discharge status: Home / Self Care | Payer: BC Managed Care – PPO | Source: Home / Self Care | Attending: Emergency Medicine | Admitting: Emergency Medicine

## 2019-09-29 ENCOUNTER — Emergency Department (HOSPITAL_COMMUNITY): Payer: BC Managed Care – PPO

## 2019-09-29 ENCOUNTER — Encounter (HOSPITAL_COMMUNITY): Payer: Self-pay | Admitting: *Deleted

## 2019-09-29 DIAGNOSIS — Z20822 Contact with and (suspected) exposure to covid-19: Secondary | ICD-10-CM | POA: Insufficient documentation

## 2019-09-29 DIAGNOSIS — K5792 Diverticulitis of intestine, part unspecified, without perforation or abscess without bleeding: Secondary | ICD-10-CM | POA: Insufficient documentation

## 2019-09-29 DIAGNOSIS — Z79899 Other long term (current) drug therapy: Secondary | ICD-10-CM | POA: Insufficient documentation

## 2019-09-29 DIAGNOSIS — Z7982 Long term (current) use of aspirin: Secondary | ICD-10-CM | POA: Insufficient documentation

## 2019-09-29 DIAGNOSIS — R7881 Bacteremia: Secondary | ICD-10-CM | POA: Diagnosis not present

## 2019-09-29 DIAGNOSIS — K5732 Diverticulitis of large intestine without perforation or abscess without bleeding: Secondary | ICD-10-CM | POA: Diagnosis not present

## 2019-09-29 DIAGNOSIS — I10 Essential (primary) hypertension: Secondary | ICD-10-CM | POA: Insufficient documentation

## 2019-09-29 DIAGNOSIS — J45909 Unspecified asthma, uncomplicated: Secondary | ICD-10-CM | POA: Insufficient documentation

## 2019-09-29 DIAGNOSIS — F17210 Nicotine dependence, cigarettes, uncomplicated: Secondary | ICD-10-CM | POA: Insufficient documentation

## 2019-09-29 HISTORY — DX: Diverticulitis of intestine, part unspecified, without perforation or abscess without bleeding: K57.92

## 2019-09-29 LAB — URINALYSIS, ROUTINE W REFLEX MICROSCOPIC
Bilirubin Urine: NEGATIVE
Glucose, UA: NEGATIVE mg/dL
Ketones, ur: 5 mg/dL — AB
Nitrite: NEGATIVE
Protein, ur: 100 mg/dL — AB
Specific Gravity, Urine: 1.029 (ref 1.005–1.030)
pH: 5 (ref 5.0–8.0)

## 2019-09-29 LAB — COMPREHENSIVE METABOLIC PANEL
ALT: 19 U/L (ref 0–44)
AST: 31 U/L (ref 15–41)
Albumin: 4.1 g/dL (ref 3.5–5.0)
Alkaline Phosphatase: 94 U/L (ref 38–126)
Anion gap: 13 (ref 5–15)
BUN: 23 mg/dL — ABNORMAL HIGH (ref 6–20)
CO2: 20 mmol/L — ABNORMAL LOW (ref 22–32)
Calcium: 8.8 mg/dL — ABNORMAL LOW (ref 8.9–10.3)
Chloride: 103 mmol/L (ref 98–111)
Creatinine, Ser: 0.75 mg/dL (ref 0.44–1.00)
GFR calc Af Amer: >60 mL/min (ref >60)
GFR calc non Af Amer: >60 mL/min (ref >60)
Glucose, Bld: 109 mg/dL — ABNORMAL HIGH (ref 70–99)
Potassium: 3.6 mmol/L (ref 3.5–5.1)
Sodium: 136 mmol/L (ref 135–145)
Total Bilirubin: 1.7 mg/dL — ABNORMAL HIGH (ref 0.3–1.2)
Total Protein: 7.5 g/dL (ref 6.5–8.1)

## 2019-09-29 LAB — CBC WITH DIFFERENTIAL/PLATELET
Basophils Absolute: 0 10*3/uL (ref 0.0–0.1)
Basophils Relative: 0 %
Eosinophils Absolute: 0 10*3/uL (ref 0.0–0.5)
Eosinophils Relative: 0 %
HCT: 48.4 % — ABNORMAL HIGH (ref 36.0–46.0)
Hemoglobin: 15.7 g/dL — ABNORMAL HIGH (ref 12.0–15.0)
Lymphocytes Relative: 7 %
Lymphs Abs: 0.7 10*3/uL (ref 0.7–4.0)
MCH: 28.6 pg (ref 26.0–34.0)
MCHC: 32.4 g/dL (ref 30.0–36.0)
MCV: 88.3 fL (ref 80.0–100.0)
Monocytes Absolute: 0.2 10*3/uL (ref 0.1–1.0)
Monocytes Relative: 2 %
Neutro Abs: 9.3 10*3/uL — ABNORMAL HIGH (ref 1.7–7.7)
Neutrophils Relative %: 90 %
Platelets: 184 10*3/uL (ref 150–400)
RBC: 5.48 MIL/uL — ABNORMAL HIGH (ref 3.87–5.11)
RDW: 15 % (ref 11.5–15.5)
WBC: 10.1 10*3/uL (ref 4.0–10.5)
nRBC: 0 % (ref 0.0–0.2)

## 2019-09-29 LAB — POC SARS CORONAVIRUS 2 AG -  ED: SARS Coronavirus 2 Ag: NEGATIVE

## 2019-09-29 LAB — LIPASE, BLOOD: Lipase: 21 U/L (ref 11–51)

## 2019-09-29 LAB — PREGNANCY, URINE: Preg Test, Ur: NEGATIVE

## 2019-09-29 LAB — LACTIC ACID, PLASMA: Lactic Acid, Venous: 1.9 mmol/L (ref 0.5–1.9)

## 2019-09-29 IMAGING — CT CT ABD-PELV W/O CM
2 of 4 series · 16 of 46 positions shown, 18 images · non-contrast
Comparison: CT [DATE]

CLINICAL DATA: Diverticulitis suspected Oral contrast only history
of ulcers and diverticulitis, left sided abd pain, sharp pain that
comes and goes. N/V Fever 101.6 Diverticulitis suspected

EXAM:
CT ABDOMEN AND PELVIS WITHOUT CONTRAST
TECHNIQUE: Multidetector CT imaging of the abdomen and pelvis was performed
following the standard protocol without IV contrast.

[Series 2: axial st · axial · 0.73mm/px · z∈[+1022,+1382]mm · 13 of 80 slices shown, 15 images]
[im 4/80  soft-tissue]
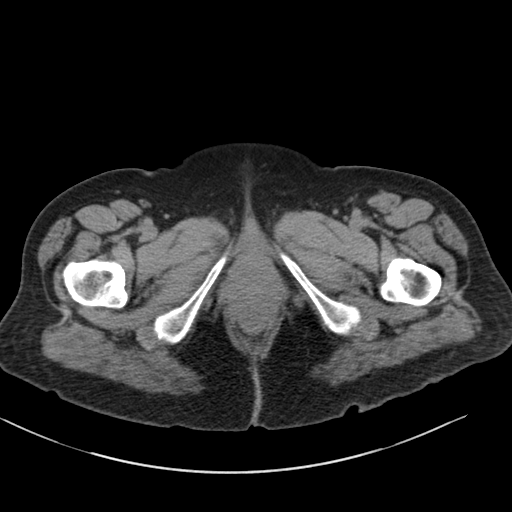
[im 4/80  bone]
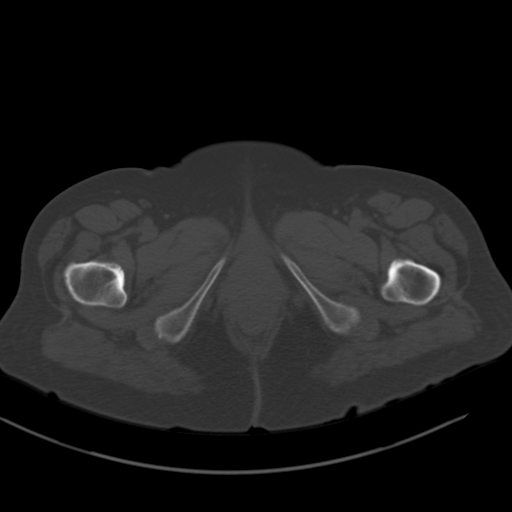
[im 12/80  soft-tissue]
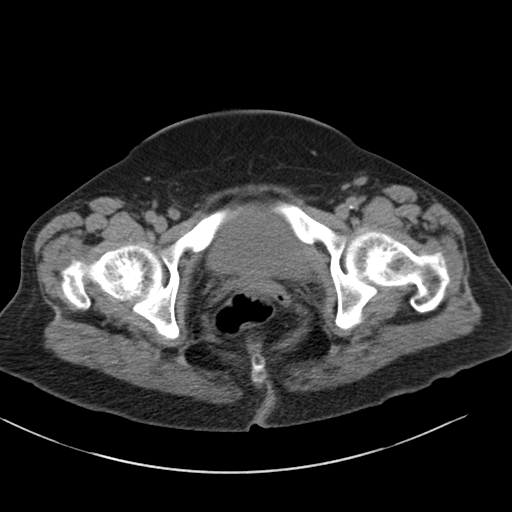
[im 16/80  soft-tissue]
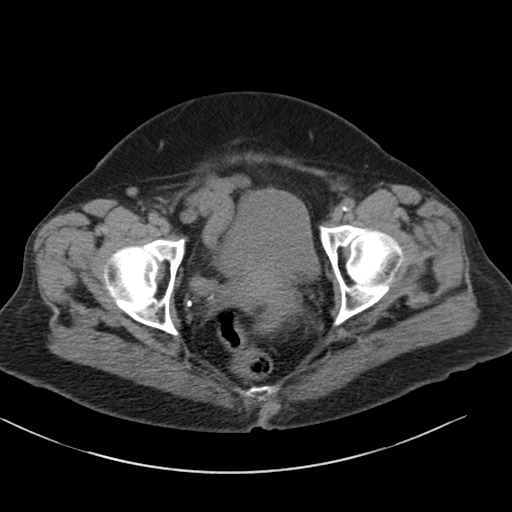
[im 24/80  soft-tissue]
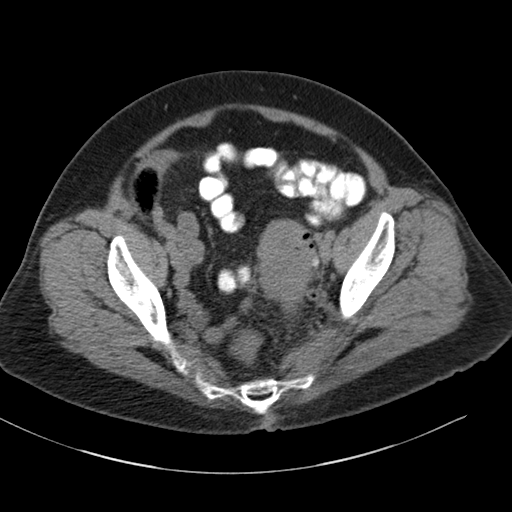
[im 28/80  soft-tissue]
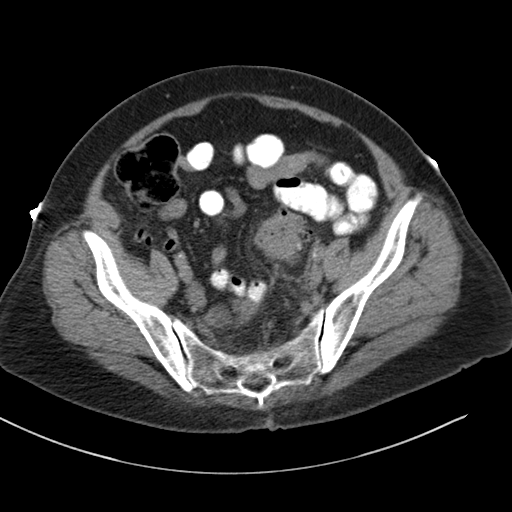
[im 36/80  soft-tissue]
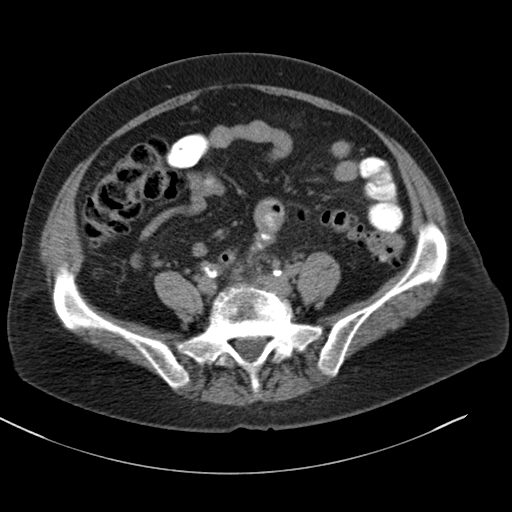
[im 40/80  soft-tissue]
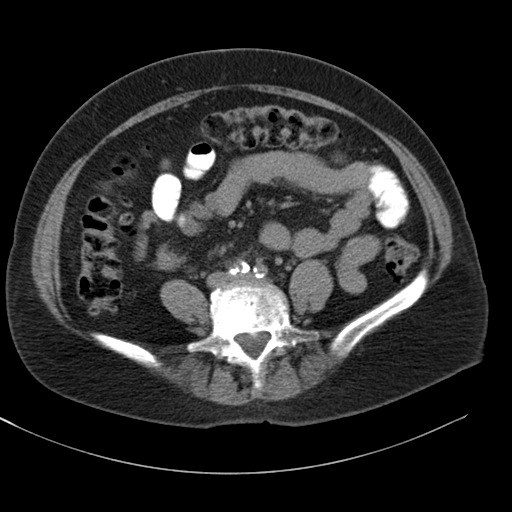
[im 44/80  soft-tissue]
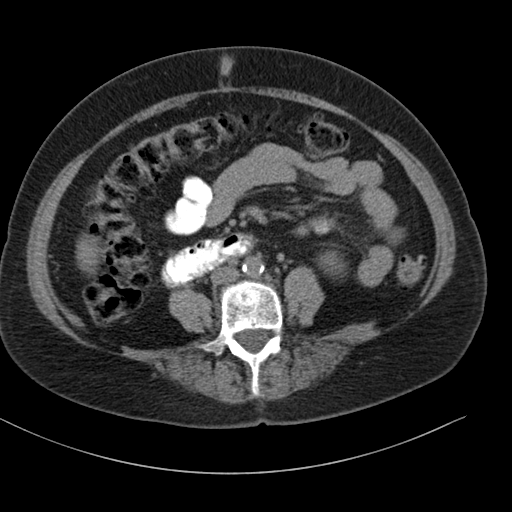
[im 52/80  soft-tissue]
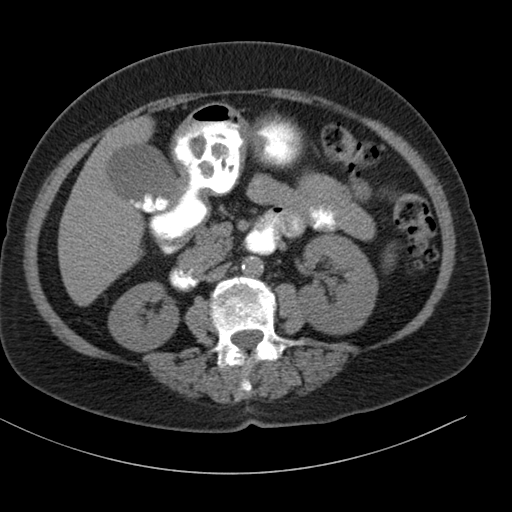
[im 52/80  bone]
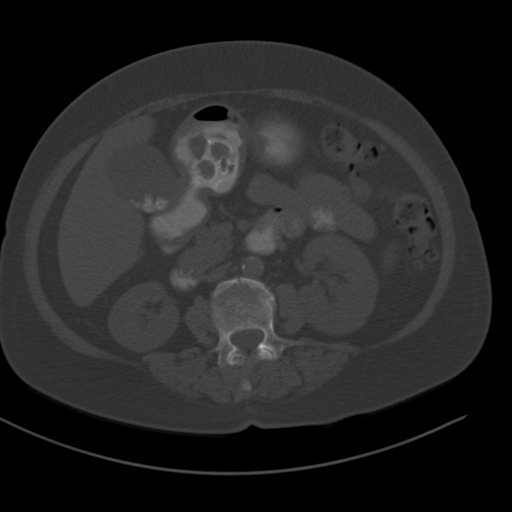
[im 56/80  soft-tissue]
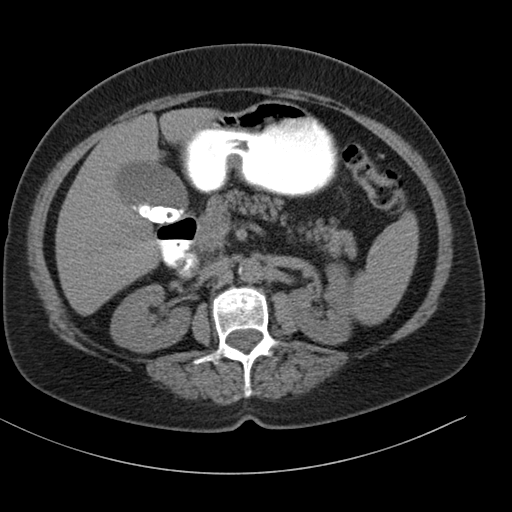
[im 64/80  soft-tissue]
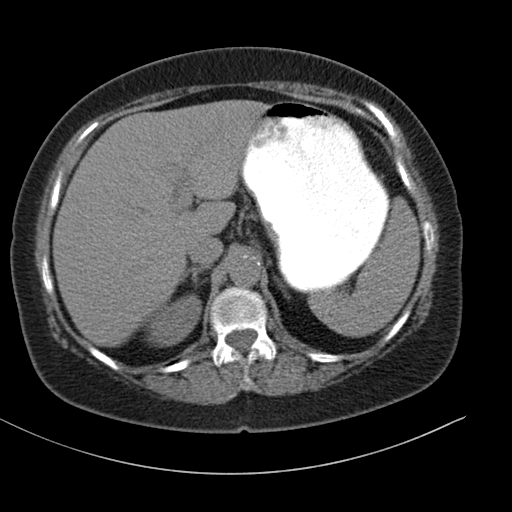
[im 68/80  soft-tissue]
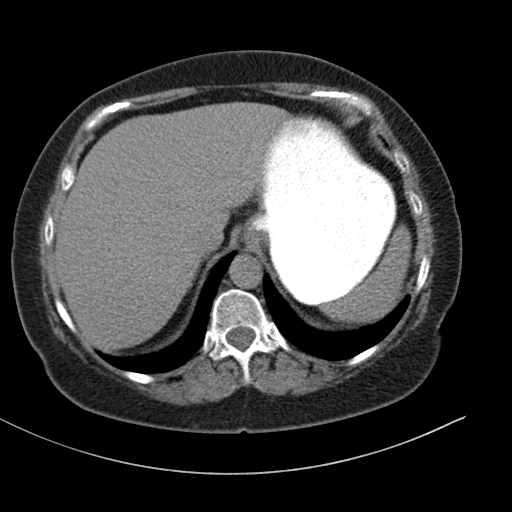
[im 76/80  soft-tissue]
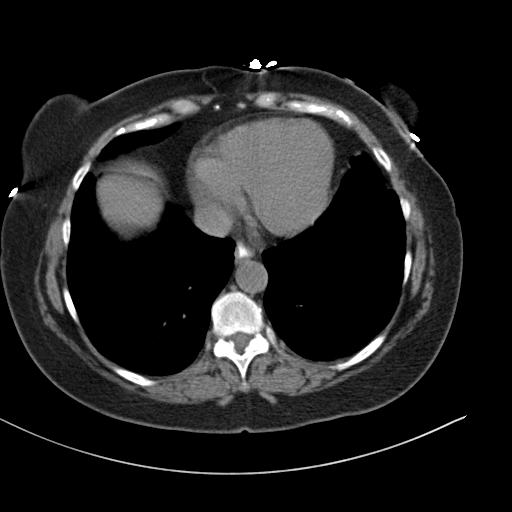

[Series 4: coronal st · coronal · 0.76mm/px · 3 of 154 slices shown]
[im 52/154  soft-tissue]
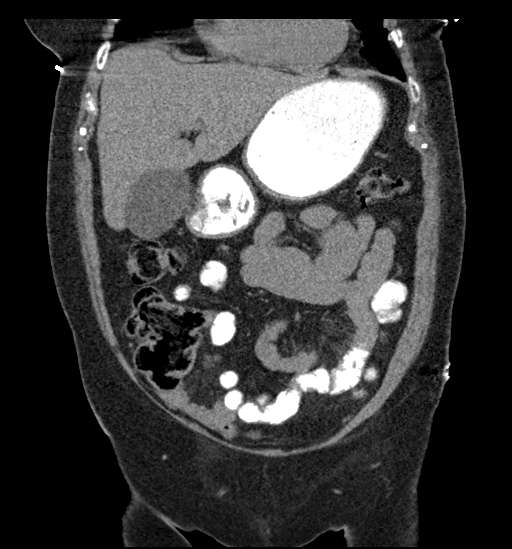
[im 69/154  soft-tissue]
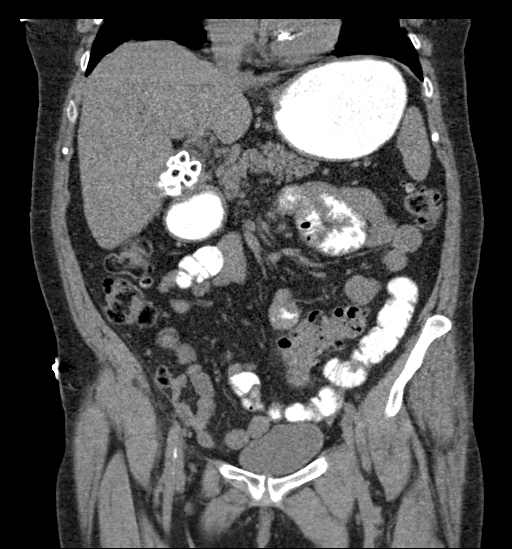
[im 86/154  soft-tissue]
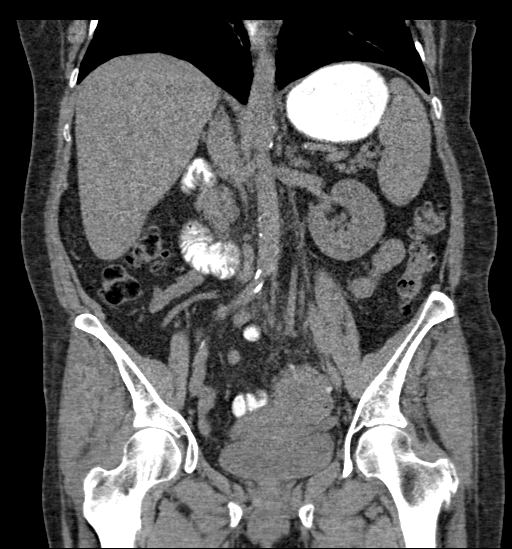

[16 of 46 positions shown; findings below may reference images not displayed]

FINDINGS: Lower chest: Lung bases are clear.

Hepatobiliary: No focal hepatic lesion on noncontrast exam. Multiple
gallstones noted.

Pancreas: Pancreas is normal. No ductal dilatation. No pancreatic
inflammation.

Spleen: Normal spleen

Adrenals/urinary tract: Adrenal glands and kidneys are normal. The
ureters and bladder normal.

Stomach/Bowel: Stomach, small bowel, appendix, and cecum are normal.
Ascending and transverse colon normal. There are diverticula
descending colon. There is thickening through the proximal sigmoid
colon and mild Peri colonic fat stranding. This is similar to
comparison CT of [DATE] with there was an inflammatory
collection which is resolved. There is no extraluminal gas or fluid
collections currently. Only inflammatory stranding through this
region of diverticulosis and thickened bowel (image 58/2)

Vascular/Lymphatic: Abdominal aorta is normal caliber with
atherosclerotic calcification. There is no retroperitoneal or
periportal lymphadenopathy. No pelvic lymphadenopathy.

Reproductive: Uterus and adnexa are normal.

Other: No free fluid.

Musculoskeletal: No aggressive osseous lesion.
IMPRESSION: Mild acute diverticulitis of the proximal sigmoid colon. No evidence
of abscess or perforation.

Persistent thickening of the proximal sigmoid colon through the
region of inflammation similar to this CT of [DATE]. Recommend
follow-up colonoscopy to exclude underlying mucosal lesion.

## 2019-09-29 MED ORDER — DICYCLOMINE HCL 20 MG PO TABS: 20.0000 mg | ORAL_TABLET | Freq: Two times a day (BID) | ORAL | 0 refills | Status: DC

## 2019-09-29 MED ORDER — SODIUM CHLORIDE 0.9% FLUSH: 3.0000 mL | Freq: Once | INTRAVENOUS | Status: DC

## 2019-09-29 MED ORDER — ONDANSETRON 4 MG PO TBDP: 4.0000 mg | ORAL_TABLET | Freq: Three times a day (TID) | ORAL | 0 refills | Status: DC | PRN

## 2019-09-29 MED ORDER — AMOXICILLIN-POT CLAVULANATE 875-125 MG PO TABS: 1.0000 | ORAL_TABLET | Freq: Two times a day (BID) | ORAL | 0 refills | Status: DC

## 2019-09-29 MED ORDER — IOHEXOL 9 MG/ML PO SOLN: 500.0000 mL | ORAL | Status: DC

## 2019-09-29 MED ORDER — HYDROCODONE-ACETAMINOPHEN 5-325 MG PO TABS: 1.0000 | ORAL_TABLET | Freq: Four times a day (QID) | ORAL | 0 refills | Status: DC | PRN

## 2019-09-29 MED ORDER — SODIUM CHLORIDE 0.9 % IV BOLUS
1000.0000 mL | Freq: Once | INTRAVENOUS | Status: AC
  Administered 2019-09-29: 1000 mL via INTRAVENOUS

## 2019-09-29 MED ORDER — PIPERACILLIN-TAZOBACTAM 3.375 G IVPB 30 MIN
3.3750 g | Freq: Once | INTRAVENOUS | Status: AC
  Administered 2019-09-29: 3.375 g via INTRAVENOUS
  Filled 2019-09-29: qty 50

## 2019-09-29 MED ORDER — IOHEXOL 9 MG/ML PO SOLN
ORAL | Status: AC
  Filled 2019-09-29: qty 1000

## 2019-09-29 MED ORDER — HYDROMORPHONE HCL 1 MG/ML IJ SOLN
1.0000 mg | Freq: Once | INTRAMUSCULAR | Status: AC
  Administered 2019-09-29: 1 mg via INTRAVENOUS
  Filled 2019-09-29: qty 1

## 2019-09-29 MED ORDER — ONDANSETRON HCL 4 MG/2ML IJ SOLN
4.0000 mg | Freq: Once | INTRAMUSCULAR | Status: AC
  Administered 2019-09-29: 4 mg via INTRAVENOUS
  Filled 2019-09-29: qty 2

## 2019-09-29 NOTE — Discharge Instructions (Addendum)
  Diverticulitis  Hand washing: Wash your hands throughout the day, but especially before and after touching the face, using the restroom, sneezing, coughing, or touching surfaces that have been coughed or sneezed upon. Hydration: Symptoms will be intensified and complicated by dehydration. Dehydration can also extend the duration of symptoms. Drink plenty of fluids and get plenty of rest. You should be drinking at least half a liter of water an hour to stay hydrated. Electrolyte drinks (ex. Gatorade, Powerade, Pedialyte) are also encouraged. You should be drinking enough fluids to make your urine light yellow, almost clear. If this is not the case, you are not drinking enough water. Please note that some of the treatments indicated below will not be effective if you are not adequately hydrated. Diet: Please concentrate on hydration, however, you may introduce food slowly.  Start with a clear liquid diet, progressed to a full liquid diet, and then bland solids as you are able. Please take all of your antibiotics until finished!   You may develop abdominal discomfort or diarrhea from the antibiotic.  You may help offset this with probiotics which you can buy or get in yogurt. Do not eat or take the probiotics until 2 hours after your antibiotic.  Pain or fever: Ibuprofen, Naproxen, or Tylenol for pain or fever.  Antiinflammatory medications: Take 600 mg of ibuprofen every 6 hours or 440 mg (over the counter dose) to 500 mg (prescription dose) of naproxen every 12 hours for the next 3 days. After this time, these medications may be used as needed for pain. Take these medications with food to avoid upset stomach. Choose only one of these medications, do not take them together. Acetaminophen (generic for Tylenol): Should you continue to have additional pain while taking the ibuprofen or naproxen, you may add in acetaminophen as needed. Your daily total maximum amount of acetaminophen from all sources should be  limited to 4000mg /day for persons without liver problems, or 2000mg /day for those with liver problems. Vicodin: May take Vicodin (hydrocodone-acetaminophen) as needed for severe pain.   Do not drive or perform other dangerous activities while taking this medication as it can cause drowsiness as well as changes in reaction time and judgement.   Please note that each pill of Vicodin contains 325 mg of acetaminophen (generic for Tylenol) and the above dosage limits apply. Nausea/vomiting: Use the ondansetron (generic for Zofran) for nausea or vomiting.  This medication may not prevent all vomiting or nausea, but can help facilitate better hydration. Things that can help with nausea/vomiting also include peppermint/menthol candies, vitamin B12, and ginger. Diarrhea: May use medications such as loperamide (Imodium) or Bismuth subsalicylate (Pepto-Bismol). Bentyl: This medication is what is known as an antispasmodic and is intended to help reduce abdominal discomfort. Follow-up: Follow-up with a primary care provider on this matter. It should also be noted that there was a recommendation on the CT interpretation for repeat colonoscopy due to tissue thickening in the colon.  Please call to for an appointment to have this performed. Return: Return should you develop a fever, bloody diarrhea, increased abdominal pain, uncontrolled vomiting, or any other major concerns.  For prescription assistance, may try using prescription discount sites or apps, such as goodrx.com

## 2019-09-29 NOTE — ED Provider Notes (Signed)
[facility] DEPT Provider Note   CSN: [id number] Arrival date & time: 09/29/19  1232     History Chief Complaint  Patient presents with  . Abdominal Pain    Brittany Hobbs is a 59 y.o. female.  HPI      Brittany Hobbs is a 59 y.o. female, with a history of diverticulitis, asthma, hyperlipidemia, lupus, presenting to the ED with abdominal pain beginning yesterday.  Pain is in the left lower quadrant, intermittent, stabbing, 8/10, nonradiating.  Accompanied by chills and nausea.  One episode of nonbilious nonbloody emesis.  She has had similar symptoms in the past with diverticulitis. She has had small, pellet-like bowel movements for the last couple of days as well as loss of appetite.  Last food was this morning around 7 AM. Patient received 1 g of Tylenol and 400 mcg of fentanyl via EMS.  Reportedly tachycardic to 120 prior to Tylenol with EMS. Intermittent alcohol consumption.  He denies illicit drug use.  Smokes 5 to 6 cigarettes daily. Denies known fever, hematochezia/melena, diarrhea, chest pain, shortness of breath, urinary symptoms, syncope, or any other complaints.   Past Medical History:  Diagnosis Date  . Allergy   . Arthritis   . Asthma   . Diverticulitis   . Hyperlipidemia   . Lupus ([place])   . Panic attacks   . Seasonal allergies     Patient Active Problem List   Diagnosis Date Noted  . Bacteriuria 08/11/2018  . Hydronephrosis, left 08/11/2018  . Diverticulitis 08/10/2018  . Colonic diverticular abscess 08/10/2018  . Lupus ([place]) 08/10/2018  . Hyperlipidemia 08/10/2018  . HTN (hypertension) 08/10/2018  . Anxiety 08/10/2018    Past Surgical History:  Procedure Laterality Date  . PATELLA FRACTURE SURGERY Left   . scar tissue eye  1983   right  . TUBAL LIGATION  1985  . WRIST FRACTURE SURGERY Right      OB History   No obstetric history on file.     Family History  Problem Relation Age of Onset  . Colon  cancer Neg Hx   . Stomach cancer Neg Hx   . Esophageal cancer Neg Hx   . Rectal cancer Neg Hx     Social History   Tobacco Use  . Smoking status: Current Every Day Smoker    Packs/day: 0.30    Types: Cigarettes  . Smokeless tobacco: Never Used  . Tobacco comment: Trying to quit  Substance Use Topics  . Alcohol use: No  . Drug use: No    Home Medications Prior to Admission medications   Medication Sig Start Date End Date Taking? Authorizing Provider  aspirin EC 81 MG tablet Take 81 mg by mouth daily.   Yes [provider]  diphenhydrAMINE (BENADRYL) 50 MG tablet Take 50 mg by mouth at bedtime.    Yes [provider]  escitalopram (LEXAPRO) 20 MG tablet Take 20 mg by mouth daily.   Yes [provider]  fluticasone (FLONASE) 50 MCG/ACT nasal spray Place 1 spray into both nostrils as needed for allergies.  10/29/11  Yes [provider]  gabapentin (NEURONTIN) 400 MG capsule Take 400 mg by mouth 3 (three) times daily.    Yes [provider]  LORazepam (ATIVAN) 0.5 MG tablet Take 0.5-1 mg by mouth See admin instructions. 0.5mg  in AM and 1mg  in evening 09/03/19  Yes [provider]  Probiotic Product (SOLUBLE FIBER/PROBIOTICS PO) Take by mouth daily.   Yes  [provider]  amoxicillin-clavulanate (AUGMENTIN) 875-125 MG tablet Take 1 tablet by mouth every 12 (twelve) hours. 09/29/19   Denisa Enterline C, PA-C  dicyclomine (BENTYL) 20 MG tablet Take 1 tablet (20 mg total) by mouth 2 (two) times daily. 09/29/19   Dequan Kindred C, PA-C  HYDROcodone-acetaminophen (NORCO/VICODIN) 5-325 MG tablet Take 1-2 tablets by mouth every 6 (six) hours as needed for severe pain. 09/29/19   Donielle Kaigler C, PA-C  naproxen sodium (ALEVE) 220 MG tablet Take 220 mg by mouth daily as needed (back pain.).    [provider]  ondansetron (ZOFRAN ODT) 4 MG disintegrating tablet Take 1 tablet (4 mg total) by mouth every 8 (eight) hours as needed for nausea or  vomiting. 09/29/19   Shifra Swartzentruber C, PA-C    Allergies    Iodine, Betadine [povidone iodine], and Sulfa antibiotics  Review of Systems   Review of Systems  Constitutional: Positive for appetite change and chills.  Respiratory: Negative for cough and shortness of breath.   Cardiovascular: Negative for chest pain.  Gastrointestinal: Positive for abdominal pain, nausea and vomiting. Negative for blood in stool and diarrhea.  Genitourinary: Negative for dysuria, flank pain and hematuria.  Musculoskeletal: Negative for back pain.  All other systems reviewed and are negative.   Physical Exam Updated Vital Signs BP (!) 155/81 (BP Location: Right Arm)   Pulse 94   Temp (!) 103.1 F (39.5 C) (Oral)   Resp 18   SpO2 94%   Physical Exam Vitals and nursing note reviewed.  Constitutional:      General: She is not in acute distress.    Appearance: She is well-developed. She is not diaphoretic.  HENT:     Head: Normocephalic and atraumatic.     Mouth/Throat:     Mouth: Mucous membranes are moist.     Pharynx: Oropharynx is clear.  Eyes:     Conjunctiva/sclera: Conjunctivae normal.  Cardiovascular:     Rate and Rhythm: Normal rate and regular rhythm.     Pulses: Normal pulses.          Radial pulses are 2+ on the right side and 2+ on the left side.       Posterior tibial pulses are 2+ on the right side and 2+ on the left side.     Heart sounds: Normal heart sounds.     Comments: Tactile temperature in the extremities appropriate and equal bilaterally. Pulmonary:     Effort: Pulmonary effort is normal. No respiratory distress.     Breath sounds: Normal breath sounds.  Abdominal:     Palpations: Abdomen is soft.     Tenderness: There is abdominal tenderness in the left lower quadrant. There is no guarding.  Musculoskeletal:     Cervical back: Neck supple.     Right lower leg: No edema.     Left lower leg: No edema.  Lymphadenopathy:     Cervical: No cervical adenopathy.  Skin:     General: Skin is warm and dry.  Neurological:     Mental Status: She is alert.  Psychiatric:        Mood and Affect: Mood and affect normal.        Speech: Speech normal.        Behavior: Behavior normal.     ED Results / Procedures / Treatments   Labs (all labs ordered are listed, but only abnormal results are displayed) Labs Reviewed  COMPREHENSIVE METABOLIC PANEL - Abnormal; Notable for the following components:   CO2 20 (*)    Glucose, Bld 109 (*)    BUN 23 (*)    Calcium 8.8 (*)    Total Bilirubin 1.7 (*)    All other components within normal limits  URINALYSIS, ROUTINE W REFLEX MICROSCOPIC - Abnormal; Notable for the following components:   Color, Urine AMBER (*)    APPearance HAZY (*)    Hgb urine dipstick MODERATE (*)    Ketones, ur 5 (*)    Protein, ur 100 (*)    Leukocytes,Ua SMALL (*)    Bacteria, UA RARE (*)    All other components within normal limits  CBC WITH DIFFERENTIAL/PLATELET - Abnormal; Notable for the following components:   RBC 5.48 (*)    Hemoglobin 15.7 (*)    HCT 48.4 (*)    Neutro Abs 9.3 (*)    All other components within normal limits  CULTURE, BLOOD (ROUTINE X 2)  CULTURE, BLOOD (ROUTINE X 2)  LIPASE, BLOOD  LACTIC ACID, PLASMA  PREGNANCY, URINE  POC SARS CORONAVIRUS 2 AG -  ED    EKG None  Radiology CT ABDOMEN PELVIS WO CONTRAST  Result Date: 09/29/2019 CLINICAL DATA:  Diverticulitis suspected Oral contrast only history of ulcers and diverticulitis, left sided abd pain, sharp pain that comes and goes. N/V Fever 101.6 Diverticulitis suspected EXAM: CT ABDOMEN AND PELVIS WITHOUT CONTRAST TECHNIQUE: Multidetector CT imaging of the abdomen and pelvis was performed following the standard protocol without IV contrast. COMPARISON:  CT 08/10/2018 FINDINGS: Lower chest: Lung bases are clear. Hepatobiliary: No focal hepatic lesion on noncontrast exam. Multiple gallstones noted. Pancreas: Pancreas is normal. No ductal  dilatation. No pancreatic inflammation. Spleen: Normal spleen Adrenals/urinary tract: Adrenal glands and kidneys are normal. The ureters and bladder normal. Stomach/Bowel: Stomach, small bowel, appendix, and cecum are normal. Ascending and transverse colon normal. There are diverticula descending colon. There is thickening through the proximal sigmoid colon and mild Peri colonic fat stranding. This is similar to comparison CT of 08/10/2018 with there was an inflammatory collection which is resolved. There is no extraluminal gas or fluid collections currently. Only inflammatory stranding through this region of diverticulosis and thickened bowel (image 58/2) Vascular/Lymphatic: Abdominal aorta is normal caliber with atherosclerotic calcification. There is no retroperitoneal or periportal lymphadenopathy. No pelvic lymphadenopathy. Reproductive: Uterus and adnexa are normal. Other: No free fluid. Musculoskeletal: No aggressive osseous lesion. IMPRESSION: Mild acute diverticulitis of the proximal sigmoid colon. No evidence of abscess or perforation. Persistent thickening of the proximal sigmoid colon through the region of inflammation similar to this CT of 08/10/2018. Recommend follow-up colonoscopy to exclude underlying mucosal lesion. Electronically Signed   By: Suzy Bouchard M.D.   On: 09/29/2019 16:31    Procedures Procedures (including critical care time)  Medications Ordered in ED Medications  HYDROmorphone (DILAUDID) injection 1 mg (1 mg Intravenous Given 09/29/19 1349)  ondansetron (ZOFRAN) injection 4 mg (4 mg Intravenous Given 09/29/19 1349)  sodium chloride 0.9 % bolus 1,000 mL (0 mLs Intravenous Stopped 09/29/19 1517)  piperacillin-tazobactam (ZOSYN) IVPB 3.375 g (0 g Intravenous Stopped 09/29/19 1517)    ED Course  I have reviewed the triage vital signs and the nursing notes.  Pertinent labs & imaging results that were available during my care of the patient were reviewed by me and  considered in my medical decision making (see chart for details).  Clinical Course as of Sep 29 2214  Mon Sep 29, 2019  1642 Patient continues to be pain-free.  Small amount of mild left lower quadrant focal tenderness.  Patient appears quite relaxed.   [SJ]    Clinical Course User Index [SJ] Delanda Bulluck, Helane Gunther, PA-C   MDM Rules/Calculators/A&P                      Patient presents with abdominal pain beginning yesterday. Patient is nontoxic appearing, not tachypneic, not hypotensive, maintains adequate SPO2 on room air, and is in no apparent distress.  Febrile here in the ED, but not tachycardic. No leukocytosis. CT with evidence of mild diverticulitis.  Recommendation for repeat colonoscopy was also discussed with the patient. Due to the patient's reassuring follow-up exam and relatively mild findings on CT, there is not a noted reason for admission at this time. The patient was given instructions for home care as well as return precautions. Patient voices understanding of these instructions, accepts the plan, and is comfortable with discharge.  Findings and plan of care discussed with Madalyn Rob, MD. Dr. Roslynn Amble personally evaluated and examined this patient.  Vitals:   09/29/19 1430 09/29/19 1607 09/29/19 1609 09/29/19 1700  BP: (!) 155/81 128/75  124/75  Pulse: 90 88  82  Resp: 18 16  16   Temp:   98.3 F (36.8 C)   TempSrc:   Oral   SpO2: 94% 93%  94%     Final Clinical Impression(s) / ED Diagnoses Final diagnoses:  Diverticulitis    Rx / DC Orders ED Discharge Orders         Ordered    amoxicillin-clavulanate (AUGMENTIN) 875-125 MG tablet  Every 12 hours     09/29/19 1721    ondansetron (ZOFRAN ODT) 4 MG disintegrating tablet  Every 8 hours PRN     09/29/19 1721    HYDROcodone-acetaminophen (NORCO/VICODIN) 5-325 MG tablet  Every 6 hours PRN     09/29/19 1721    dicyclomine (BENTYL) 20 MG tablet  2 times daily     09/29/19 1721           Lorayne Bender, PA-C  09/30/19 2217    Lucrezia Starch, MD 10/01/19 1003

## 2019-09-29 NOTE — ED Triage Notes (Addendum)
EMS reports. Pt has history of ulcers and diverticulitis, left sided abd pain, sharp pain that comes and goes. N/V Fever 101.6 1000 mg Tylenol given and 100 mcg Fentanyl 400 cc NS IV left F/A 186/80-120-22-97 RA CBG 185

## 2019-09-30 ENCOUNTER — Other Ambulatory Visit: Payer: Self-pay

## 2019-09-30 ENCOUNTER — Encounter (HOSPITAL_COMMUNITY): Payer: Self-pay | Admitting: Family Medicine

## 2019-09-30 ENCOUNTER — Telehealth (HOSPITAL_BASED_OUTPATIENT_CLINIC_OR_DEPARTMENT_OTHER): Payer: Self-pay | Admitting: Emergency Medicine

## 2019-09-30 ENCOUNTER — Inpatient Hospital Stay (HOSPITAL_COMMUNITY)
Admission: EM | Admit: 2019-09-30 | Discharge: 2019-10-02 | DRG: 392 | Disposition: A | Discharge status: Home / Self Care | Payer: BC Managed Care – PPO | Attending: Internal Medicine | Admitting: Internal Medicine

## 2019-09-30 DIAGNOSIS — R7989 Other specified abnormal findings of blood chemistry: Secondary | ICD-10-CM | POA: Diagnosis present

## 2019-09-30 DIAGNOSIS — K8020 Calculus of gallbladder without cholecystitis without obstruction: Secondary | ICD-10-CM | POA: Diagnosis present

## 2019-09-30 DIAGNOSIS — Z7982 Long term (current) use of aspirin: Secondary | ICD-10-CM

## 2019-09-30 DIAGNOSIS — Z888 Allergy status to other drugs, medicaments and biological substances status: Secondary | ICD-10-CM

## 2019-09-30 DIAGNOSIS — G8929 Other chronic pain: Secondary | ICD-10-CM | POA: Diagnosis present

## 2019-09-30 DIAGNOSIS — R7881 Bacteremia: Secondary | ICD-10-CM | POA: Diagnosis present

## 2019-09-30 DIAGNOSIS — Z791 Long term (current) use of non-steroidal anti-inflammatories (NSAID): Secondary | ICD-10-CM | POA: Diagnosis not present

## 2019-09-30 DIAGNOSIS — N179 Acute kidney failure, unspecified: Secondary | ICD-10-CM | POA: Diagnosis present

## 2019-09-30 DIAGNOSIS — K5792 Diverticulitis of intestine, part unspecified, without perforation or abscess without bleeding: Secondary | ICD-10-CM | POA: Diagnosis not present

## 2019-09-30 DIAGNOSIS — Z79891 Long term (current) use of opiate analgesic: Secondary | ICD-10-CM

## 2019-09-30 DIAGNOSIS — R7401 Elevation of levels of liver transaminase levels: Secondary | ICD-10-CM | POA: Diagnosis present

## 2019-09-30 DIAGNOSIS — F17210 Nicotine dependence, cigarettes, uncomplicated: Secondary | ICD-10-CM | POA: Diagnosis present

## 2019-09-30 DIAGNOSIS — Z882 Allergy status to sulfonamides status: Secondary | ICD-10-CM | POA: Diagnosis not present

## 2019-09-30 DIAGNOSIS — F419 Anxiety disorder, unspecified: Secondary | ICD-10-CM | POA: Diagnosis present

## 2019-09-30 DIAGNOSIS — Z20822 Contact with and (suspected) exposure to covid-19: Secondary | ICD-10-CM | POA: Diagnosis present

## 2019-09-30 DIAGNOSIS — F410 Panic disorder [episodic paroxysmal anxiety] without agoraphobia: Secondary | ICD-10-CM | POA: Diagnosis present

## 2019-09-30 DIAGNOSIS — Z79899 Other long term (current) drug therapy: Secondary | ICD-10-CM | POA: Diagnosis not present

## 2019-09-30 DIAGNOSIS — B9620 Unspecified Escherichia coli [E. coli] as the cause of diseases classified elsewhere: Secondary | ICD-10-CM

## 2019-09-30 DIAGNOSIS — D696 Thrombocytopenia, unspecified: Secondary | ICD-10-CM | POA: Diagnosis present

## 2019-09-30 DIAGNOSIS — K5732 Diverticulitis of large intestine without perforation or abscess without bleeding: Secondary | ICD-10-CM | POA: Diagnosis present

## 2019-09-30 HISTORY — DX: Acute kidney failure, unspecified: N17.9

## 2019-09-30 LAB — CBC WITH DIFFERENTIAL/PLATELET
Abs Immature Granulocytes: 0.01 10*3/uL (ref 0.00–0.07)
Basophils Absolute: 0 10*3/uL (ref 0.0–0.1)
Basophils Relative: 1 %
Eosinophils Absolute: 0 10*3/uL (ref 0.0–0.5)
Eosinophils Relative: 0 %
HCT: 50.1 % — ABNORMAL HIGH (ref 36.0–46.0)
Hemoglobin: 16 g/dL — ABNORMAL HIGH (ref 12.0–15.0)
Immature Granulocytes: 0 %
Lymphocytes Relative: 8 %
Lymphs Abs: 0.4 10*3/uL — ABNORMAL LOW (ref 0.7–4.0)
MCH: 27.9 pg (ref 26.0–34.0)
MCHC: 31.9 g/dL (ref 30.0–36.0)
MCV: 87.4 fL (ref 80.0–100.0)
Monocytes Absolute: 0.1 10*3/uL (ref 0.1–1.0)
Monocytes Relative: 1 %
Neutro Abs: 4.7 10*3/uL (ref 1.7–7.7)
Neutrophils Relative %: 90 %
Platelets: 147 10*3/uL — ABNORMAL LOW (ref 150–400)
RBC: 5.73 MIL/uL — ABNORMAL HIGH (ref 3.87–5.11)
RDW: 14.9 % (ref 11.5–15.5)
WBC: 5.2 10*3/uL (ref 4.0–10.5)
nRBC: 0 % (ref 0.0–0.2)

## 2019-09-30 LAB — BLOOD CULTURE ID PANEL (REFLEXED)

## 2019-09-30 LAB — COMPREHENSIVE METABOLIC PANEL
ALT: 71 U/L — ABNORMAL HIGH (ref 0–44)
AST: 110 U/L — ABNORMAL HIGH (ref 15–41)
Albumin: 3.4 g/dL — ABNORMAL LOW (ref 3.5–5.0)
Alkaline Phosphatase: 93 U/L (ref 38–126)
Anion gap: 9 (ref 5–15)
BUN: 30 mg/dL — ABNORMAL HIGH (ref 6–20)
CO2: 27 mmol/L (ref 22–32)
Calcium: 8.7 mg/dL — ABNORMAL LOW (ref 8.9–10.3)
Chloride: 101 mmol/L (ref 98–111)
Creatinine, Ser: 1.13 mg/dL — ABNORMAL HIGH (ref 0.44–1.00)
GFR calc Af Amer: >60 mL/min (ref >60)
GFR calc non Af Amer: 54 mL/min — ABNORMAL LOW (ref >60)
Glucose, Bld: 105 mg/dL — ABNORMAL HIGH (ref 70–99)
Potassium: 3.4 mmol/L — ABNORMAL LOW (ref 3.5–5.1)
Sodium: 137 mmol/L (ref 135–145)
Total Bilirubin: 2 mg/dL — ABNORMAL HIGH (ref 0.3–1.2)
Total Protein: 6.9 g/dL (ref 6.5–8.1)

## 2019-09-30 MED ORDER — MORPHINE SULFATE (PF) 4 MG/ML IV SOLN
4.0000 mg | Freq: Once | INTRAVENOUS | Status: AC
  Administered 2019-09-30: 4 mg via INTRAVENOUS
  Filled 2019-09-30: qty 1

## 2019-09-30 MED ORDER — ACETAMINOPHEN 650 MG RE SUPP: 650.0000 mg | Freq: Four times a day (QID) | RECTAL | Status: DC | PRN

## 2019-09-30 MED ORDER — ONDANSETRON HCL 4 MG/2ML IJ SOLN
4.0000 mg | Freq: Four times a day (QID) | INTRAMUSCULAR | Status: DC | PRN
  Administered 2019-10-01: 4 mg via INTRAVENOUS
  Filled 2019-09-30 (×2): qty 2

## 2019-09-30 MED ORDER — ONDANSETRON HCL 4 MG PO TABS: 4.0000 mg | ORAL_TABLET | Freq: Four times a day (QID) | ORAL | Status: DC | PRN

## 2019-09-30 MED ORDER — SODIUM CHLORIDE 0.9 % IV SOLN
2.0000 g | Freq: Once | INTRAVENOUS | Status: AC
  Administered 2019-09-30: 2 g via INTRAVENOUS
  Filled 2019-09-30: qty 20

## 2019-09-30 MED ORDER — POTASSIUM CHLORIDE IN NACL 20-0.9 MEQ/L-% IV SOLN: INTRAVENOUS | Status: DC

## 2019-09-30 MED ORDER — ESCITALOPRAM OXALATE 10 MG PO TABS
20.0000 mg | ORAL_TABLET | Freq: Every day | ORAL | Status: DC
  Administered 2019-10-01 – 2019-10-02 (×2): 20 mg via ORAL
  Filled 2019-09-30 (×2): qty 2

## 2019-09-30 MED ORDER — SODIUM CHLORIDE 0.9 % IV SOLN
2.0000 g | INTRAVENOUS | Status: DC
  Administered 2019-10-01: 2 g via INTRAVENOUS
  Filled 2019-09-30: qty 2
  Filled 2019-09-30: qty 20

## 2019-09-30 MED ORDER — DIPHENHYDRAMINE HCL 25 MG PO CAPS: 50.0000 mg | ORAL_CAPSULE | Freq: Every evening | ORAL | Status: DC | PRN

## 2019-09-30 MED ORDER — ASPIRIN EC 81 MG PO TBEC
81.0000 mg | DELAYED_RELEASE_TABLET | Freq: Every day | ORAL | Status: DC
  Administered 2019-10-01: 81 mg via ORAL
  Filled 2019-09-30: qty 1

## 2019-09-30 MED ORDER — FENTANYL CITRATE (PF) 100 MCG/2ML IJ SOLN
25.0000 ug | INTRAMUSCULAR | Status: DC | PRN
  Administered 2019-10-01 (×4): 25 ug via INTRAVENOUS
  Administered 2019-10-02: 50 ug via INTRAVENOUS
  Filled 2019-09-30 (×6): qty 2

## 2019-09-30 MED ORDER — METRONIDAZOLE IN NACL 5-0.79 MG/ML-% IV SOLN
500.0000 mg | Freq: Once | INTRAVENOUS | Status: AC
  Administered 2019-09-30: 500 mg via INTRAVENOUS
  Filled 2019-09-30: qty 100

## 2019-09-30 MED ORDER — ACETAMINOPHEN 325 MG PO TABS
650.0000 mg | ORAL_TABLET | Freq: Four times a day (QID) | ORAL | Status: DC | PRN
  Administered 2019-10-01 – 2019-10-02 (×2): 650 mg via ORAL
  Filled 2019-09-30 (×2): qty 2

## 2019-09-30 MED ORDER — GABAPENTIN 400 MG PO CAPS
400.0000 mg | ORAL_CAPSULE | Freq: Three times a day (TID) | ORAL | Status: DC
  Administered 2019-10-01 – 2019-10-02 (×5): 400 mg via ORAL
  Filled 2019-09-30 (×5): qty 1

## 2019-09-30 MED ORDER — ENOXAPARIN SODIUM 40 MG/0.4ML ~~LOC~~ SOLN
40.0000 mg | SUBCUTANEOUS | Status: DC
  Administered 2019-10-01 – 2019-10-02 (×2): 40 mg via SUBCUTANEOUS
  Filled 2019-09-30 (×3): qty 0.4

## 2019-09-30 MED ORDER — LORAZEPAM 0.5 MG PO TABS
1.0000 mg | ORAL_TABLET | Freq: Every day | ORAL | Status: DC
  Administered 2019-10-01 (×2): 1 mg via ORAL
  Filled 2019-09-30 (×2): qty 2

## 2019-09-30 MED ORDER — ONDANSETRON HCL 4 MG/2ML IJ SOLN
4.0000 mg | Freq: Once | INTRAMUSCULAR | Status: AC
  Administered 2019-09-30: 4 mg via INTRAVENOUS
  Filled 2019-09-30: qty 2

## 2019-09-30 NOTE — ED Notes (Signed)
ED TO INPATIENT HANDOFF REPORT  ED Nurse Name and Phone #: Gara Kroner, RN  S Name/Age/Gender Brittany Hobbs 59 y.o. female Room/Bed: WA15/WA15  Code Status   Code Status: Prior  Home/SNF/Other Home Patient oriented to: self, place, time and situation Is this baseline? Yes   Triage Complete: Triage complete  Chief Complaint Acute diverticulitis [K57.92]  Triage Note Patient was seen at [facility] ED yesterday for  Diverticulitis. Today, she was called to return to either [facility] or [facility] for positive blood cultures.     Allergies Allergies  Allergen Reactions  . Iodine Anaphylaxis    Pt states she is allergic to INTERNAL IODINE  . Betadine [Povidone Iodine] Hives  . Sulfa Antibiotics     N/V    Level of Care/Admitting Diagnosis ED Disposition    ED Disposition Condition Comment   Admit  Hospital Area: [place] [[id number]]  Level of Care: Med-Surg [16]  Covid Evaluation: Asymptomatic Screening Protocol (No Symptoms)  Diagnosis: Acute diverticulitis [id number]  Admitting Physician: Vianne Bulls [id number]  Attending Physician: Vianne Bulls [id number]  Estimated length of stay: past midnight tomorrow  Certification:: I certify this patient will need inpatient services for at least 2 midnights       B Medical/Surgery History Past Medical History:  Diagnosis Date  . Allergy   . Arthritis   . Asthma   . Diverticulitis   . Hyperlipidemia   . Lupus ([place])   . Panic attacks   . Seasonal allergies    Past Surgical History:  Procedure Laterality Date  . PATELLA FRACTURE SURGERY Left   . scar tissue eye  1983   right  . TUBAL LIGATION  1985  . WRIST FRACTURE SURGERY Right      A IV Location/Drains/Wounds Patient Lines/Drains/Airways Status   Active Line/Drains/Airways    Name:   Placement date:   Placement time:   Site:   Days:   Peripheral IV 09/30/19 Right Antecubital   09/30/19    --    Antecubital   less than 1           Intake/Output Last 24 hours No intake or output data in the 24 hours ending 09/30/19 2318  Labs/Imaging Results for orders placed or performed during the hospital encounter of 09/30/19 (from the past 48 hour(s))  CBC with Differential     Status: Abnormal   Collection Time: 09/30/19  9:27 PM  Result Value Ref Range   WBC 5.2 4.0 - 10.5 K/uL   RBC 5.73 (H) 3.87 - 5.11 MIL/uL   Hemoglobin 16.0 (H) 12.0 - 15.0 g/dL   HCT 50.1 (H) 36.0 - 46.0 %   MCV 87.4 80.0 - 100.0 fL   MCH 27.9 26.0 - 34.0 pg   MCHC 31.9 30.0 - 36.0 g/dL   RDW 14.9 11.5 - 15.5 %   Platelets 147 (L) 150 - 400 K/uL   nRBC 0.0 0.0 - 0.2 %   Neutrophils Relative % 90 %    Comment: VACUOLATED NEUTROPHILS   Neutro Abs 4.7 1.7 - 7.7 K/uL   Lymphocytes Relative 8 %   Lymphs Abs 0.4 (L) 0.7 - 4.0 K/uL   Monocytes Relative 1 %   Monocytes Absolute 0.1 0.1 - 1.0 K/uL   Eosinophils Relative 0 %   Eosinophils Absolute 0.0 0.0 - 0.5 K/uL   Basophils Relative 1 %   Basophils Absolute 0.0 0.0 - 0.1 K/uL   Immature Granulocytes 0 %  Abs Immature Granulocytes 0.01 0.00 - 0.07 K/uL    Comment: Performed at [facility], [place] [address], [place], [place] [address]  Comprehensive metabolic panel     Status: Abnormal   Collection Time: 09/30/19  9:27 PM  Result Value Ref Range   Sodium 137 135 - 145 mmol/L   Potassium 3.4 (L) 3.5 - 5.1 mmol/L   Chloride 101 98 - 111 mmol/L   CO2 27 22 - 32 mmol/L   Glucose, Bld 105 (H) 70 - 99 mg/dL   BUN 30 (H) 6 - 20 mg/dL   Creatinine, Ser 1.13 (H) 0.44 - 1.00 mg/dL   Calcium 8.7 (L) 8.9 - 10.3 mg/dL   Total Protein 6.9 6.5 - 8.1 g/dL   Albumin 3.4 (L) 3.5 - 5.0 g/dL   AST 110 (H) 15 - 41 U/L   ALT 71 (H) 0 - 44 U/L   Alkaline Phosphatase 93 38 - 126 U/L   Total Bilirubin 2.0 (H) 0.3 - 1.2 mg/dL   GFR calc non Af Amer 54 (L) >60 mL/min   GFR calc Af Amer >60 >60 mL/min   Anion gap 9 5 - 15    Comment: Performed at [facility], [place] [address] [place], [place] [address]   CT ABDOMEN PELVIS WO CONTRAST  Result Date: 09/29/2019 CLINICAL DATA:  Diverticulitis suspected Oral contrast only history of ulcers and diverticulitis, left sided abd pain, sharp pain that comes and goes. N/V Fever 101.6 Diverticulitis suspected EXAM: CT ABDOMEN AND PELVIS WITHOUT CONTRAST TECHNIQUE: Multidetector CT imaging of the abdomen and pelvis was performed following the standard protocol without IV contrast. COMPARISON:  CT 08/10/2018 FINDINGS: Lower chest: Lung bases are clear. Hepatobiliary: No focal hepatic lesion on noncontrast exam. Multiple gallstones noted. Pancreas: Pancreas is normal. No ductal dilatation. No pancreatic inflammation. Spleen: Normal spleen Adrenals/urinary tract: Adrenal glands and kidneys are normal. The ureters and bladder normal. Stomach/Bowel: Stomach, small bowel, appendix, and cecum are normal. Ascending and transverse colon normal. There are diverticula descending colon. There is thickening through the proximal sigmoid colon and mild Peri colonic fat stranding. This is similar to comparison CT of 08/10/2018 with there was an inflammatory collection which is resolved. There is no extraluminal gas or fluid collections currently. Only inflammatory stranding through this region of diverticulosis and thickened bowel (image 58/2) Vascular/Lymphatic: Abdominal aorta is normal caliber with atherosclerotic calcification. There is no retroperitoneal or periportal lymphadenopathy. No pelvic lymphadenopathy. Reproductive: Uterus and adnexa are normal. Other: No free fluid. Musculoskeletal: No aggressive osseous lesion. IMPRESSION: Mild acute diverticulitis of the proximal sigmoid colon. No evidence of abscess or perforation. Persistent thickening of the proximal sigmoid colon through the region of inflammation similar to this CT of 08/10/2018. Recommend follow-up colonoscopy to exclude underlying mucosal lesion. Electronically Signed   By:  Suzy Bouchard M.D.   On: 09/29/2019 16:31    Pending Labs Unresulted Labs (From admission, onward)    Start     Ordered   09/30/19 2155  SARS CORONAVIRUS 2 (TAT 6-24 HRS) Nasopharyngeal Nasopharyngeal Swab  (Tier 3 (TAT 6-24 hrs))  Once,   STAT    Question Answer Comment  Is this test for diagnosis or screening Screening   Symptomatic for COVID-19 as defined by CDC No   Hospitalized for COVID-19 No   Admitted to ICU for COVID-19 No   Previously tested for COVID-19 Yes   Resident in a congregate (group) care setting No   Employed in healthcare setting  No   Pregnant No      09/30/19 2154   09/30/19 2129  Blood culture (routine x 2)  BLOOD CULTURE X 2,   STAT     09/30/19 2128   Signed and Held  HIV Antibody (routine testing w rflx)  (HIV Antibody (Routine testing w reflex) panel)  Tomorrow morning,   R     Signed and Held   Signed and Held  Creatinine, serum  (enoxaparin (LOVENOX)    CrCl >/= 30 ml/min)  Weekly,   R    Comments: while on enoxaparin therapy    Signed and Held   Signed and Held  Comprehensive metabolic panel  Tomorrow morning,   R     Signed and Held   Signed and Held  CBC WITH DIFFERENTIAL  Tomorrow morning,   R     Signed and Held   Signed and Held  Magnesium  Tomorrow morning,   R     Signed and Held   Signed and Held  Sodium, urine, random  Add-on,   R     Signed and Held   Signed and Held  Creatinine, urine, random  Add-on,   R     Signed and Held          Vitals/Pain Today's Vitals   09/30/19 2123 09/30/19 2200 09/30/19 2230 09/30/19 2300  BP: (!) 146/131 (!) 111/55 105/62 100/80  Pulse: 90 97 90 (!) 101  Resp: 18 18 18 18   Temp:      TempSrc:      SpO2: 98% 97% 98% 96%  Weight:      Height:      PainSc:        Isolation Precautions No active isolations  Medications Medications  cefTRIAXone (ROCEPHIN) 2 g in sodium chloride 0.9 % 100 mL IVPB (has no administration in time range)  cefTRIAXone (ROCEPHIN) 2 g in sodium chloride 0.9 % 100  mL IVPB (0 g Intravenous Stopped 09/30/19 2218)    And  metroNIDAZOLE (FLAGYL) IVPB 500 mg (0 mg Intravenous Stopped 09/30/19 2317)  morphine 4 MG/ML injection 4 mg (4 mg Intravenous Given 09/30/19 2145)  ondansetron (ZOFRAN) injection 4 mg (4 mg Intravenous Given 09/30/19 2145)    Mobility walks Low fall risk   Focused Assessments N/A   R Recommendations: See Admitting Provider Note  Report given to:   Additional Notes: N/A

## 2019-09-30 NOTE — H&P (Signed)
History and Physical    Brittany Hobbs [id number] DOB: Jan 25, 1961 DOA: 09/30/2019  PCP: Berkley Harvey, NP   Patient coming from: Home   Chief Complaint: Diverticulitis, called back for positive blood culture   HPI: Brittany Hobbs is a 59 y.o. female with medical history significant for lupus, anxiety with panic attacks, and chronic pain, who presented to emergency department with abdominal pain, fevers, and positive blood culture after recent diverticulitis diagnosis.  Patient had been experiencing severe pain in the left lower abdomen, was febrile to 101.6 F at home, was seen in the emergency department for this on 09/29/2019, had CT evidence for acute sigmoid diverticulitis without complication, and was sent home with Augmentin.  She continues to have pain and subjective fevers, and she was called today after a blood culture was found to be growing E. coli.  She has no new complaints at this time and denies any cough, shortness of breath, leg swelling, leg tenderness, or chest pain.  Patient has not on any regularly scheduled medications for her lupus but reports that she is managed with steroids as needed for flares and has not been on any steroid for approximately a month or so.  ED Course: Upon arrival to the ED, patient is found to be afebrile, saturating well on room air, and with stable blood pressure.  Chemistry panel notable for potassium 3.4, mild elevation in transaminases, total bilirubin 2.0, and creatinine 1.13, up from 0.75 the day prior.  CBC notable for a new slight thrombocytopenia.  Blood cultures were repeated in the ED, Covid PCR test is pending, the patient was treated with morphine, Zofran, 2 g IV Rocephin, and Flagyl.  Review of Systems:  All other systems reviewed and apart from HPI, are negative.  Past Medical History:  Diagnosis Date  . Allergy   . Arthritis   . Asthma   . Diverticulitis   . Hyperlipidemia   . Lupus ([place])   . Panic attacks   .  Seasonal allergies     Past Surgical History:  Procedure Laterality Date  . PATELLA FRACTURE SURGERY Left   . scar tissue eye  1983   right  . TUBAL LIGATION  1985  . WRIST FRACTURE SURGERY Right      reports that she has been smoking cigarettes. She has been smoking about 0.30 packs per day. She has never used smokeless tobacco. She reports that she does not drink alcohol or use drugs.  Allergies  Allergen Reactions  . Iodine Anaphylaxis    Pt states she is allergic to INTERNAL IODINE  . Betadine [Povidone Iodine] Hives  . Sulfa Antibiotics     N/V    Family History  Problem Relation Age of Onset  . Colon cancer Neg Hx   . Stomach cancer Neg Hx   . Esophageal cancer Neg Hx   . Rectal cancer Neg Hx      Prior to Admission medications   Medication Sig Start Date End Date Taking? Authorizing Provider  amoxicillin-clavulanate (AUGMENTIN) 875-125 MG tablet Take 1 tablet by mouth every 12 (twelve) hours. 09/29/19  Yes Joy, Shawn C, PA-C  aspirin EC 81 MG tablet Take 81 mg by mouth daily.   Yes [provider]  dicyclomine (BENTYL) 20 MG tablet Take 1 tablet (20 mg total) by mouth 2 (two) times daily. 09/29/19  Yes Joy, Shawn C, PA-C  diphenhydrAMINE (BENADRYL) 50 MG tablet Take 50 mg by mouth at bedtime.    Yes  [provider]  escitalopram (LEXAPRO) 20 MG tablet Take 20 mg by mouth daily.   Yes [provider]  fluticasone (FLONASE) 50 MCG/ACT nasal spray Place 1 spray into both nostrils as needed for allergies.  10/29/11  Yes [provider]  gabapentin (NEURONTIN) 400 MG capsule Take 400 mg by mouth 3 (three) times daily.    Yes [provider]  HYDROcodone-acetaminophen (NORCO/VICODIN) 5-325 MG tablet Take 1-2 tablets by mouth every 6 (six) hours as needed for severe pain. 09/29/19  Yes Joy, Shawn C, PA-C  LORazepam (ATIVAN) 0.5 MG tablet Take 0.5-1 mg by mouth See admin instructions. 0.5mg  in AM and 1mg  in evening 09/03/19  Yes  [provider]  naproxen sodium (ALEVE) 220 MG tablet Take 220 mg by mouth daily as needed (back pain.).   Yes [provider]  ondansetron (ZOFRAN ODT) 4 MG disintegrating tablet Take 1 tablet (4 mg total) by mouth every 8 (eight) hours as needed for nausea or vomiting. 09/29/19  Yes Joy, Shawn C, PA-C  Probiotic Product (SOLUBLE FIBER/PROBIOTICS PO) Take by mouth daily.   Yes [provider]    Physical Exam: Vitals:   09/30/19 1945 09/30/19 2123 09/30/19 2200 09/30/19 2230  BP: (!) 103/55 (!) 146/131 (!) 111/55 105/62  Pulse: 91 90 97 90  Resp: 20 18 18 18   Temp: 100.1 F (37.8 C)     TempSrc: Oral     SpO2: 95% 98% 97% 98%  Weight: 67.1 kg     Height: 5\' 6"  (1.676 m)       Constitutional: NAD, calm  Eyes: PERTLA, lids and conjunctivae normal ENMT: Mucous membranes are moist. Posterior pharynx clear of any exudate or lesions.   Neck: normal, supple, no masses, no thyromegaly Respiratory: no wheezing, no crackles. Normal respiratory effort. No accessory muscle use.  Cardiovascular: S1 & S2 heard, regular rate and rhythm. No extremity edema.   Abdomen: No distension, soft, tender in LLQ without rebound pain or guarding. Bowel sounds active.  Musculoskeletal: no clubbing / cyanosis. No joint deformity upper and lower extremities.   Skin: no significant rashes, lesions, ulcers. Warm, dry, well-perfused. Neurologic: No facial asymmetry. Sensation intact. Moving all extremities.  Psychiatric: Alert and answering questions appropriately. Pleasant, cooperative.      Labs on Admission: I have personally reviewed following labs and imaging studies  CBC: Recent Labs  Lab 09/29/19 1403 09/30/19 2127  WBC 10.1 5.2  NEUTROABS 9.3* 4.7  HGB 15.7* 16.0*  HCT 48.4* 50.1*  MCV 88.3 87.4  PLT 184 [id number]*   Basic Metabolic Panel: Recent Labs  Lab 09/29/19 1401 09/30/19 2127  NA 136 137  K 3.6 3.4*  CL 103 101  CO2 20* 27  GLUCOSE 109* 105*  BUN 23* 30*    CREATININE 0.75 1.13*  CALCIUM 8.8* 8.7*   GFR: Estimated Creatinine Clearance: 51.4 mL/min (A) (by C-G formula based on SCr of 1.13 mg/dL (H)). Liver Function Tests: Recent Labs  Lab 09/29/19 1401 09/30/19 2127  AST 31 110*  ALT 19 71*  ALKPHOS 94 93  BILITOT 1.7* 2.0*  PROT 7.5 6.9  ALBUMIN 4.1 3.4*   Recent Labs  Lab 09/29/19 1401  LIPASE 21   No results for input(s): AMMONIA in the last 168 hours. Coagulation Profile: No results for input(s): INR, PROTIME in the last 168 hours. Cardiac Enzymes: No results for input(s): CKTOTAL, CKMB, CKMBINDEX, TROPONINI in the last 168 hours. BNP (last 3 results) No results for input(s): PROBNP in  the last 8760 hours. HbA1C: No results for input(s): HGBA1C in the last 72 hours. CBG: No results for input(s): GLUCAP in the last 168 hours. Lipid Profile: No results for input(s): CHOL, HDL, LDLCALC, TRIG, CHOLHDL, LDLDIRECT in the last 72 hours. Thyroid Function Tests: No results for input(s): TSH, T4TOTAL, FREET4, T3FREE, THYROIDAB in the last 72 hours. Anemia Panel: No results for input(s): VITAMINB12, FOLATE, FERRITIN, TIBC, IRON, RETICCTPCT in the last 72 hours. Urine analysis:    Component Value Date/Time   COLORURINE AMBER (A) 09/29/2019 1403   APPEARANCEUR HAZY (A) 09/29/2019 1403   LABSPEC 1.029 09/29/2019 1403   PHURINE 5.0 09/29/2019 1403   GLUCOSEU NEGATIVE 09/29/2019 1403   HGBUR MODERATE (A) 09/29/2019 1403   BILIRUBINUR NEGATIVE 09/29/2019 1403   KETONESUR 5 (A) 09/29/2019 1403   PROTEINUR 100 (A) 09/29/2019 1403   NITRITE NEGATIVE 09/29/2019 1403   LEUKOCYTESUR SMALL (A) 09/29/2019 1403   Sepsis Labs: @LABRCNTIP (procalcitonin:4,lacticidven:4) ) Recent Results (from the past 240 hour(s))  Culture, blood (routine x 2)     Status: None (Preliminary result)   Collection Time: 09/29/19  2:03 PM   Specimen: BLOOD  Result Value Ref Range Status   Specimen Description   Final    BLOOD RIGHT  ANTECUBITAL Performed at [facility], [place] [address], [place], [place] [address]    Special Requests   Final    BOTTLES DRAWN AEROBIC AND ANAEROBIC Blood Culture adequate volume Performed at [facility] [address], [place], [place] [address]    Culture  Setup Time   Final    GRAM NEGATIVE RODS AEROBIC BOTTLE ONLY Organism ID to follow CRITICAL RESULT CALLED TO, READ BACK BY AND VERIFIED WITH: Laurena Slimmer RN 11:55 09/30/19 ([email or web address]) Performed at [facility], [place] [address], [place], [place] [address]    Culture GRAM NEGATIVE RODS  Final   Report Status PENDING  Incomplete  Culture, blood (routine x 2)     Status: None (Preliminary result)   Collection Time: 09/29/19  2:03 PM   Specimen: BLOOD RIGHT HAND  Result Value Ref Range Status   Specimen Description   Final    BLOOD RIGHT HAND Performed at [facility] [address] [place], [place] [address]    Special Requests   Final    BOTTLES DRAWN AEROBIC AND ANAEROBIC Blood Culture adequate volume Performed at [facility] [address], [place], [place] [address]    Culture   Final    NO GROWTH < 24 HOURS Performed at [facility] [address] [place], [place] [address]    Report Status PENDING  Incomplete  Blood Culture ID Panel (Reflexed)     Status: Abnormal   Collection Time: 09/29/19  2:03 PM  Result Value Ref Range Status   Enterococcus species NOT DETECTED NOT DETECTED Final   Listeria monocytogenes NOT DETECTED NOT DETECTED Final   Staphylococcus species NOT DETECTED NOT DETECTED Final   Staphylococcus aureus (BCID) NOT DETECTED NOT DETECTED Final   Streptococcus species NOT DETECTED NOT DETECTED Final   Streptococcus agalactiae NOT DETECTED NOT DETECTED Final   Streptococcus pneumoniae NOT DETECTED NOT DETECTED Final   Streptococcus pyogenes NOT DETECTED NOT DETECTED Final   Acinetobacter baumannii NOT DETECTED NOT  DETECTED Final   Enterobacteriaceae species DETECTED (A) NOT DETECTED Final    Comment: Enterobacteriaceae represent a large family of gram-negative bacteria, not a single organism. CRITICAL RESULT CALLED TO, READ BACK BY  AND VERIFIED WITH: Laurena Slimmer RN 11:55 09/30/19 ([email or web address])    Enterobacter cloacae complex NOT DETECTED NOT DETECTED Final   Escherichia coli DETECTED (A) NOT DETECTED Final    Comment: CRITICAL RESULT CALLED TO, READ BACK BY AND VERIFIED WITH: Laurena Slimmer RN 11:55 09/30/19 ([email or web address])    Klebsiella oxytoca NOT DETECTED NOT DETECTED Final   Klebsiella pneumoniae NOT DETECTED NOT DETECTED Final   Proteus species NOT DETECTED NOT DETECTED Final   Serratia marcescens NOT DETECTED NOT DETECTED Final   Carbapenem resistance NOT DETECTED NOT DETECTED Final   Haemophilus influenzae NOT DETECTED NOT DETECTED Final   Neisseria meningitidis NOT DETECTED NOT DETECTED Final   Pseudomonas aeruginosa NOT DETECTED NOT DETECTED Final   Candida albicans NOT DETECTED NOT DETECTED Final   Candida glabrata NOT DETECTED NOT DETECTED Final   Candida krusei NOT DETECTED NOT DETECTED Final   Candida parapsilosis NOT DETECTED NOT DETECTED Final   Candida tropicalis NOT DETECTED NOT DETECTED Final    Comment: Performed at [facility], [place] [address], [place],  [address]     Radiological Exams on Admission: CT ABDOMEN PELVIS WO CONTRAST  Result Date: 09/29/2019 CLINICAL DATA:  Diverticulitis suspected Oral contrast only history of ulcers and diverticulitis, left sided abd pain, sharp pain that comes and goes. N/V Fever 101.6 Diverticulitis suspected EXAM: CT ABDOMEN AND PELVIS WITHOUT CONTRAST TECHNIQUE: Multidetector CT imaging of the abdomen and pelvis was performed following the standard protocol without IV contrast. COMPARISON:  CT 08/10/2018 FINDINGS: Lower chest: Lung bases are clear. Hepatobiliary: No focal hepatic lesion on noncontrast exam. Multiple gallstones noted. Pancreas:  Pancreas is normal. No ductal dilatation. No pancreatic inflammation. Spleen: Normal spleen Adrenals/urinary tract: Adrenal glands and kidneys are normal. The ureters and bladder normal. Stomach/Bowel: Stomach, small bowel, appendix, and cecum are normal. Ascending and transverse colon normal. There are diverticula descending colon. There is thickening through the proximal sigmoid colon and mild Peri colonic fat stranding. This is similar to comparison CT of 08/10/2018 with there was an inflammatory collection which is resolved. There is no extraluminal gas or fluid collections currently. Only inflammatory stranding through this region of diverticulosis and thickened bowel (image 58/2) Vascular/Lymphatic: Abdominal aorta is normal caliber with atherosclerotic calcification. There is no retroperitoneal or periportal lymphadenopathy. No pelvic lymphadenopathy. Reproductive: Uterus and adnexa are normal. Other: No free fluid. Musculoskeletal: No aggressive osseous lesion. IMPRESSION: Mild acute diverticulitis of the proximal sigmoid colon. No evidence of abscess or perforation. Persistent thickening of the proximal sigmoid colon through the region of inflammation similar to this CT of 08/10/2018. Recommend follow-up colonoscopy to exclude underlying mucosal lesion. Electronically Signed   By: Suzy Bouchard M.D.   On: 09/29/2019 16:31    Assessment/Plan   1. Acute sigmoid diverticulitis; E coli bacteremia   - Seen in ED with LLQ pain and fevers on 09/29/19, CT was consistent with acute sigmoid diverticulitis without abscess or perforation, and she went home with Augmentin but now returns with continued pain and positive blood culture  - Blood culture from 09/29/19 with E coli detected on BCID  - She was given Rocephin 2g IV in ED  - Continue Rocephin 2 g IV q24h, follow culture for sensitivities, follow repeat cultures and clinical course    2. AKI  - SCr is 1.13 on admission, up from 0.75 the day before   - Kidneys and ureters appeared normal on CT 09/29/19  - Likely a mild acute prerenal azotemia in light of acute infection and  poor appetite  - Hold NSAID, hydrate with IVF, repeat chem panel in am    3. Elevated transaminases and bilirubin  - New elevation in transaminases and increased total bilirubin noted on admission  - CT abd/pelvis from 09/29/19 without any focal liver lesions but multiple gall stones noted  - Check RUQ [place], trend LFT's    4. Thrombocytopenia  - New mild thrombocytopenia noted  - Likely secondary to #1, will continue antibiotics and repeat CBC in am   5. Anxiety  - Continue Lexapro and Ativan    6. Lupus   - Managed with steroids for flares, currently asymptomatic    DVT prophylaxis: Lovenox  Code Status: Full  Family Communication: Discussed with patient Consults called: None  Admission status: Inpatient    Vianne Bulls, MD [facility] Pager [phone]  If 7PM-7AM, please contact night-coverage [email or web address] Password [facility]  09/30/2019, 10:42 PM

## 2019-09-30 NOTE — ED Triage Notes (Signed)
Patient was seen at [facility] ED yesterday for  Diverticulitis. Today, she was called to return to either [facility] or [facility] for positive blood cultures.

## 2019-09-30 NOTE — ED Provider Notes (Signed)
[facility] DEPT Provider Note   CSN: [id number] Arrival date & time: 09/30/19  1930     History Chief Complaint  Patient presents with  . Abnormal Lab    Brittany Hobbs is a 59 y.o. female.  59 yo F with a cc of abdominal pain.  This is worse in the left lower quadrant.  Patient was seen yesterday and diagnosed with diverticulitis without complication.  She is continued to have fevers and has had persistent and may be slightly worsening abdominal pain.  She has not really been eating and drinking at home.  She was called today because her blood cultures had come back positive for Enterobacter as well as E. coli.  She was told to come to the hospital because she would likely be admitted.  The history is provided by the patient.  Abnormal Lab Illness Severity:  Moderate Onset quality:  Gradual Duration:  2 days Timing:  Constant Progression:  Worsening Chronicity:  New Associated symptoms: abdominal pain and nausea   Associated symptoms: no chest pain, no congestion, no fever, no headaches, no myalgias, no rhinorrhea, no shortness of breath, no vomiting and no wheezing        Past Medical History:  Diagnosis Date  . Allergy   . Arthritis   . Asthma   . Diverticulitis   . Hyperlipidemia   . Lupus ([place])   . Panic attacks   . Seasonal allergies     Patient Active Problem List   Diagnosis Date Noted  . Acute diverticulitis 09/30/2019  . E coli bacteremia 09/30/2019  . Thrombocytopenia ([place]) 09/30/2019  . AKI (acute kidney injury) ([place]) 09/30/2019  . LFT elevation 09/30/2019  . Bacteriuria 08/11/2018  . Hydronephrosis, left 08/11/2018  . Diverticulitis 08/10/2018  . Colonic diverticular abscess 08/10/2018  . Lupus ([place]) 08/10/2018  . Hyperlipidemia 08/10/2018  . HTN (hypertension) 08/10/2018  . Anxiety 08/10/2018    Past Surgical History:  Procedure Laterality Date  . PATELLA FRACTURE SURGERY Left   . scar tissue eye  1983     right  . TUBAL LIGATION  1985  . WRIST FRACTURE SURGERY Right      OB History   No obstetric history on file.     Family History  Problem Relation Age of Onset  . Colon cancer Neg Hx   . Stomach cancer Neg Hx   . Esophageal cancer Neg Hx   . Rectal cancer Neg Hx     Social History   Tobacco Use  . Smoking status: Current Every Day Smoker    Packs/day: 0.30    Types: Cigarettes  . Smokeless tobacco: Never Used  . Tobacco comment: Trying to quit  Substance Use Topics  . Alcohol use: No  . Drug use: No    Home Medications Prior to Admission medications   Medication Sig Start Date End Date Taking? Authorizing Provider  amoxicillin-clavulanate (AUGMENTIN) 875-125 MG tablet Take 1 tablet by mouth every 12 (twelve) hours. 09/29/19  Yes Joy, Shawn C, PA-C  aspirin EC 81 MG tablet Take 81 mg by mouth daily.   Yes [provider]  dicyclomine (BENTYL) 20 MG tablet Take 1 tablet (20 mg total) by mouth 2 (two) times daily. 09/29/19  Yes Joy, Shawn C, PA-C  diphenhydrAMINE (BENADRYL) 50 MG tablet Take 50 mg by mouth at bedtime.    Yes [provider]  escitalopram (LEXAPRO) 20 MG tablet Take 20 mg by mouth daily.   Yes [provider]  fluticasone (FLONASE) 50 MCG/ACT nasal spray Place 1 spray into both nostrils as needed for allergies.  10/29/11  Yes [provider]  gabapentin (NEURONTIN) 400 MG capsule Take 400 mg by mouth 3 (three) times daily.    Yes [provider]  HYDROcodone-acetaminophen (NORCO/VICODIN) 5-325 MG tablet Take 1-2 tablets by mouth every 6 (six) hours as needed for severe pain. 09/29/19  Yes Joy, Shawn C, PA-C  LORazepam (ATIVAN) 0.5 MG tablet Take 0.5-1 mg by mouth See admin instructions. 0.5mg  in AM and 1mg  in evening 09/03/19  Yes [provider]  naproxen sodium (ALEVE) 220 MG tablet Take 220 mg by mouth daily as needed (back pain.).   Yes [provider]  ondansetron (ZOFRAN ODT) 4 MG  disintegrating tablet Take 1 tablet (4 mg total) by mouth every 8 (eight) hours as needed for nausea or vomiting. 09/29/19  Yes Joy, Shawn C, PA-C  Probiotic Product (SOLUBLE FIBER/PROBIOTICS PO) Take by mouth daily.   Yes [provider]    Allergies    Iodine, Betadine [povidone iodine], and Sulfa antibiotics  Review of Systems   Review of Systems  Constitutional: Negative for chills and fever.  HENT: Negative for congestion and rhinorrhea.   Eyes: Negative for redness and visual disturbance.  Respiratory: Negative for shortness of breath and wheezing.   Cardiovascular: Negative for chest pain and palpitations.  Gastrointestinal: Positive for abdominal pain and nausea. Negative for vomiting.  Genitourinary: Negative for dysuria and urgency.  Musculoskeletal: Negative for arthralgias and myalgias.  Skin: Negative for pallor and wound.  Neurological: Negative for dizziness and headaches.    Physical Exam Updated Vital Signs BP 105/62   Pulse 90   Temp 100.1 F (37.8 C) (Oral)   Resp 18   Ht 5\' 6"  (1.676 m)   Wt 67.1 kg   SpO2 98%   BMI 23.89 kg/m   Physical Exam Vitals and nursing note reviewed.  Constitutional:      General: She is not in acute distress.    Appearance: She is well-developed. She is not diaphoretic.  HENT:     Head: Normocephalic and atraumatic.  Eyes:     Pupils: Pupils are equal, round, and reactive to light.  Cardiovascular:     Rate and Rhythm: Normal rate and regular rhythm.     Heart sounds: No murmur. No friction rub. No gallop.   Pulmonary:     Effort: Pulmonary effort is normal.     Breath sounds: No wheezing or rales.  Abdominal:     General: There is no distension.     Palpations: Abdomen is soft.     Tenderness: There is abdominal tenderness.     Comments: Diffuse tenderness  Musculoskeletal:        General: No tenderness.     Cervical back: Normal range of motion and neck supple.  Skin:    General: Skin is warm and dry.    Neurological:     Mental Status: She is alert and oriented to person, place, and time.  Psychiatric:        Behavior: Behavior normal.     ED Results / Procedures / Treatments   Labs (all labs ordered are listed, but only abnormal results are displayed) Labs Reviewed  CBC WITH DIFFERENTIAL/PLATELET - Abnormal; Notable for the following components:      Result Value   RBC 5.73 (*)    Hemoglobin 16.0 (*)    HCT 50.1 (*)    Platelets  147 (*)    Lymphs Abs 0.4 (*)    All other components within normal limits  COMPREHENSIVE METABOLIC PANEL - Abnormal; Notable for the following components:   Potassium 3.4 (*)    Glucose, Bld 105 (*)    BUN 30 (*)    Creatinine, Ser 1.13 (*)    Calcium 8.7 (*)    Albumin 3.4 (*)    AST 110 (*)    ALT 71 (*)    Total Bilirubin 2.0 (*)    GFR calc non Af Amer 54 (*)    All other components within normal limits  CULTURE, BLOOD (ROUTINE X 2)  CULTURE, BLOOD (ROUTINE X 2)  SARS CORONAVIRUS 2 (TAT 6-24 HRS)    EKG None  Radiology CT ABDOMEN PELVIS WO CONTRAST  Result Date: 09/29/2019 CLINICAL DATA:  Diverticulitis suspected Oral contrast only history of ulcers and diverticulitis, left sided abd pain, sharp pain that comes and goes. N/V Fever 101.6 Diverticulitis suspected EXAM: CT ABDOMEN AND PELVIS WITHOUT CONTRAST TECHNIQUE: Multidetector CT imaging of the abdomen and pelvis was performed following the standard protocol without IV contrast. COMPARISON:  CT 08/10/2018 FINDINGS: Lower chest: Lung bases are clear. Hepatobiliary: No focal hepatic lesion on noncontrast exam. Multiple gallstones noted. Pancreas: Pancreas is normal. No ductal dilatation. No pancreatic inflammation. Spleen: Normal spleen Adrenals/urinary tract: Adrenal glands and kidneys are normal. The ureters and bladder normal. Stomach/Bowel: Stomach, small bowel, appendix, and cecum are normal. Ascending and transverse colon normal. There are diverticula descending colon. There is  thickening through the proximal sigmoid colon and mild Peri colonic fat stranding. This is similar to comparison CT of 08/10/2018 with there was an inflammatory collection which is resolved. There is no extraluminal gas or fluid collections currently. Only inflammatory stranding through this region of diverticulosis and thickened bowel (image 58/2) Vascular/Lymphatic: Abdominal aorta is normal caliber with atherosclerotic calcification. There is no retroperitoneal or periportal lymphadenopathy. No pelvic lymphadenopathy. Reproductive: Uterus and adnexa are normal. Other: No free fluid. Musculoskeletal: No aggressive osseous lesion. IMPRESSION: Mild acute diverticulitis of the proximal sigmoid colon. No evidence of abscess or perforation. Persistent thickening of the proximal sigmoid colon through the region of inflammation similar to this CT of 08/10/2018. Recommend follow-up colonoscopy to exclude underlying mucosal lesion. Electronically Signed   By: Suzy Bouchard M.D.   On: 09/29/2019 16:31    Procedures Procedures (including critical care time)  Medications Ordered in ED Medications  cefTRIAXone (ROCEPHIN) 2 g in sodium chloride 0.9 % 100 mL IVPB (0 g Intravenous Stopped 09/30/19 2218)    And  metroNIDAZOLE (FLAGYL) IVPB 500 mg (500 mg Intravenous New Bag/Given 09/30/19 2219)  morphine 4 MG/ML injection 4 mg (4 mg Intravenous Given 09/30/19 2145)  ondansetron (ZOFRAN) injection 4 mg (4 mg Intravenous Given 09/30/19 2145)    ED Course  I have reviewed the triage vital signs and the nursing notes.  Pertinent labs & imaging results that were available during my care of the patient were reviewed by me and considered in my medical decision making (see chart for details).    MDM Rules/Calculators/A&P                      59 yo F with chief complaints of persistent abdominal pain fever and decreased oral intake despite starting antibiotics yesterday for diverticulitis.  She had a positive blood  culture that was done in the ED and was called and told to come back for IV antibiotics and likely admission.  She does has continued diffuse abdominal tenderness here.  Her white blood cell count is normal.  She is mild worsening renal dysfunction.  Will discuss with medicine for possible admission.  The patients results and plan were reviewed and discussed.   Any x-rays performed were independently reviewed by myself.   Differential diagnosis were considered with the presenting HPI.  Medications  cefTRIAXone (ROCEPHIN) 2 g in sodium chloride 0.9 % 100 mL IVPB (0 g Intravenous Stopped 09/30/19 2218)    And  metroNIDAZOLE (FLAGYL) IVPB 500 mg (500 mg Intravenous New Bag/Given 09/30/19 2219)  morphine 4 MG/ML injection 4 mg (4 mg Intravenous Given 09/30/19 2145)  ondansetron (ZOFRAN) injection 4 mg (4 mg Intravenous Given 09/30/19 2145)    Vitals:   09/30/19 1945 09/30/19 2123 09/30/19 2200 09/30/19 2230  BP: (!) 103/55 (!) 146/131 (!) 111/55 105/62  Pulse: 91 90 97 90  Resp: 20 18 18 18   Temp: 100.1 F (37.8 C)     TempSrc: Oral     SpO2: 95% 98% 97% 98%  Weight: 67.1 kg     Height: 5\' 6"  (1.676 m)       Final diagnoses:  Bacteremia  Diverticulitis    Admission/ observation were discussed with the admitting physician, patient and/or family and they are comfortable with the plan.    Final Clinical Impression(s) / ED Diagnoses Final diagnoses:  Bacteremia  Diverticulitis    Rx / DC Orders ED Discharge Orders    None       Deno Etienne, DO 09/30/19 2238

## 2019-10-01 ENCOUNTER — Inpatient Hospital Stay (HOSPITAL_COMMUNITY): Payer: BC Managed Care – PPO

## 2019-10-01 LAB — HIV ANTIBODY (ROUTINE TESTING W REFLEX): HIV Screen 4th Generation wRfx: NONREACTIVE

## 2019-10-01 LAB — COMPREHENSIVE METABOLIC PANEL
ALT: 64 U/L — ABNORMAL HIGH (ref 0–44)
AST: 84 U/L — ABNORMAL HIGH (ref 15–41)
Albumin: 3.2 g/dL — ABNORMAL LOW (ref 3.5–5.0)
Alkaline Phosphatase: 98 U/L (ref 38–126)
Anion gap: 11 (ref 5–15)
BUN: 30 mg/dL — ABNORMAL HIGH (ref 6–20)
CO2: 23 mmol/L (ref 22–32)
Calcium: 8.1 mg/dL — ABNORMAL LOW (ref 8.9–10.3)
Chloride: 103 mmol/L (ref 98–111)
Creatinine, Ser: 1.18 mg/dL — ABNORMAL HIGH (ref 0.44–1.00)
GFR calc Af Amer: 59 mL/min — ABNORMAL LOW (ref >60)
GFR calc non Af Amer: 51 mL/min — ABNORMAL LOW (ref >60)
Glucose, Bld: 138 mg/dL — ABNORMAL HIGH (ref 70–99)
Potassium: 3.7 mmol/L (ref 3.5–5.1)
Sodium: 137 mmol/L (ref 135–145)
Total Bilirubin: 1 mg/dL (ref 0.3–1.2)
Total Protein: 6.4 g/dL — ABNORMAL LOW (ref 6.5–8.1)

## 2019-10-01 LAB — CBC WITH DIFFERENTIAL/PLATELET
Abs Immature Granulocytes: 0.03 10*3/uL (ref 0.00–0.07)
Basophils Absolute: 0 10*3/uL (ref 0.0–0.1)
Basophils Relative: 0 %
Eosinophils Absolute: 0 10*3/uL (ref 0.0–0.5)
Eosinophils Relative: 0 %
HCT: 45.8 % (ref 36.0–46.0)
Hemoglobin: 14.9 g/dL (ref 12.0–15.0)
Immature Granulocytes: 1 %
Lymphocytes Relative: 8 %
Lymphs Abs: 0.4 10*3/uL — ABNORMAL LOW (ref 0.7–4.0)
MCH: 28.7 pg (ref 26.0–34.0)
MCHC: 32.5 g/dL (ref 30.0–36.0)
MCV: 88.1 fL (ref 80.0–100.0)
Monocytes Absolute: 0.1 10*3/uL (ref 0.1–1.0)
Monocytes Relative: 2 %
Neutro Abs: 4.9 10*3/uL (ref 1.7–7.7)
Neutrophils Relative %: 89 %
Platelets: 128 10*3/uL — ABNORMAL LOW (ref 150–400)
RBC: 5.2 MIL/uL — ABNORMAL HIGH (ref 3.87–5.11)
RDW: 15.1 % (ref 11.5–15.5)
WBC: 5.5 10*3/uL (ref 4.0–10.5)
nRBC: 0 % (ref 0.0–0.2)

## 2019-10-01 LAB — MAGNESIUM: Magnesium: 2 mg/dL (ref 1.7–2.4)

## 2019-10-01 LAB — SARS CORONAVIRUS 2 (TAT 6-24 HRS): SARS Coronavirus 2: NEGATIVE

## 2019-10-01 IMAGING — US US ABDOMEN LIMITED
1 series · 14 of 25 positions shown · non-contrast
Comparison: CT [DATE]

CLINICAL DATA: Elevated LFTs

EXAM:
ULTRASOUND ABDOMEN LIMITED RIGHT UPPER QUADRANT

[Series 1: us abdomen limited · 14 of 39 slices shown]
[im 1/39]
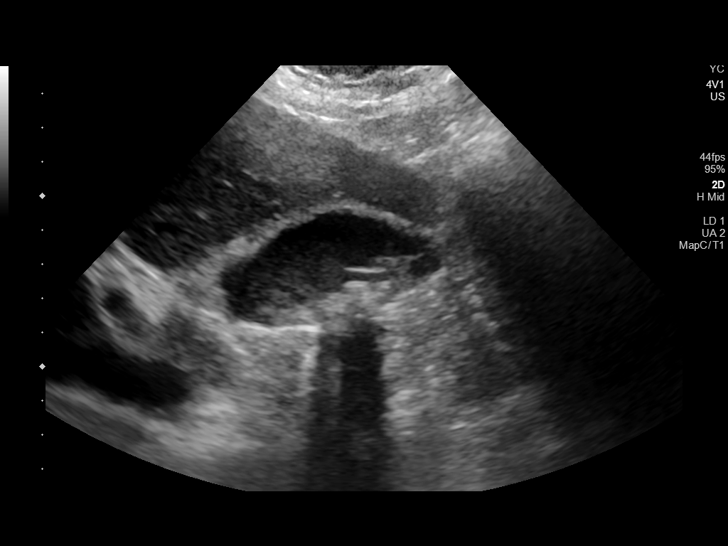
[im 4/39]
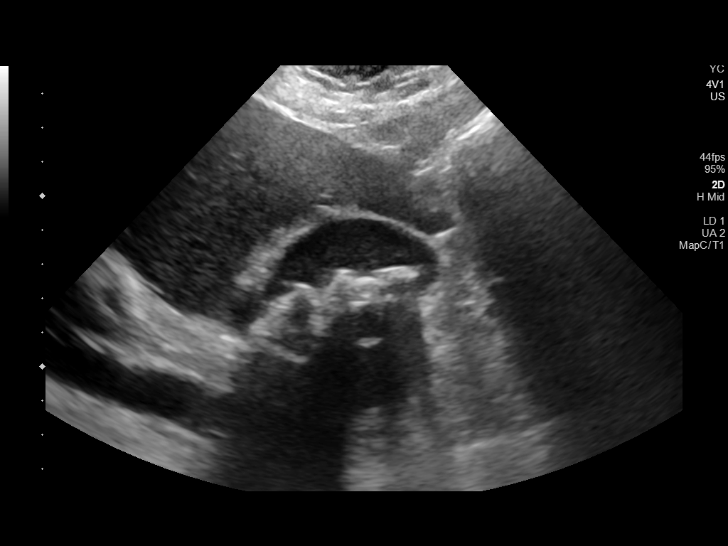
[im 7/39]
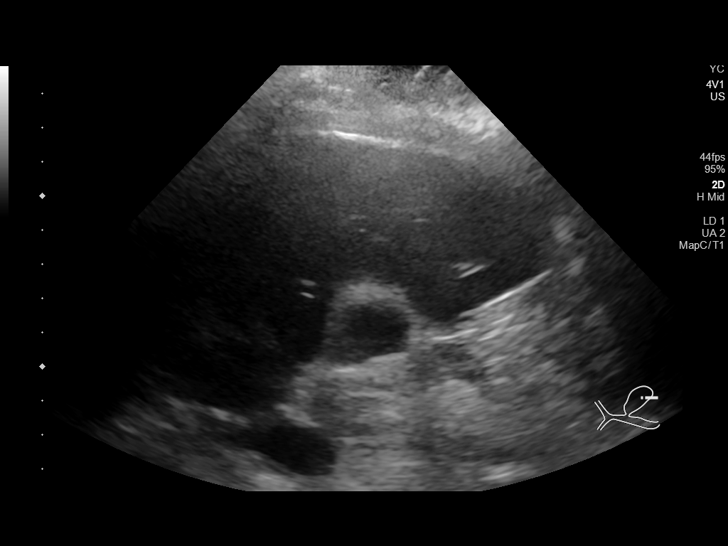
[im 10/39]
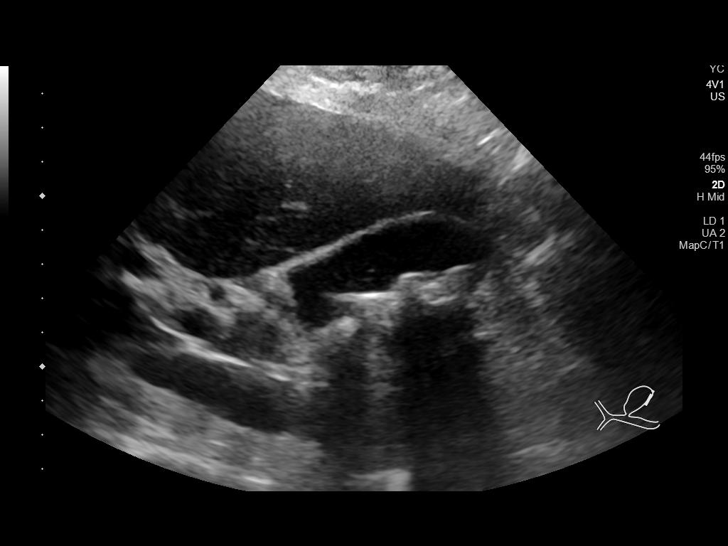
[im 13/39]
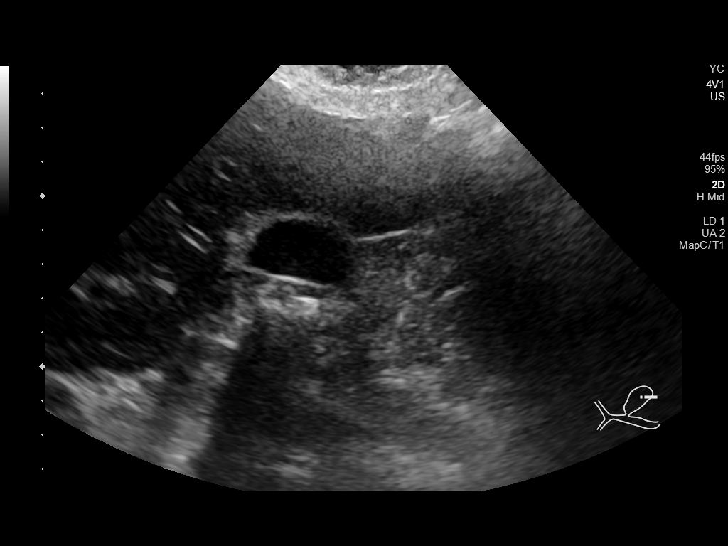
[im 15/39]
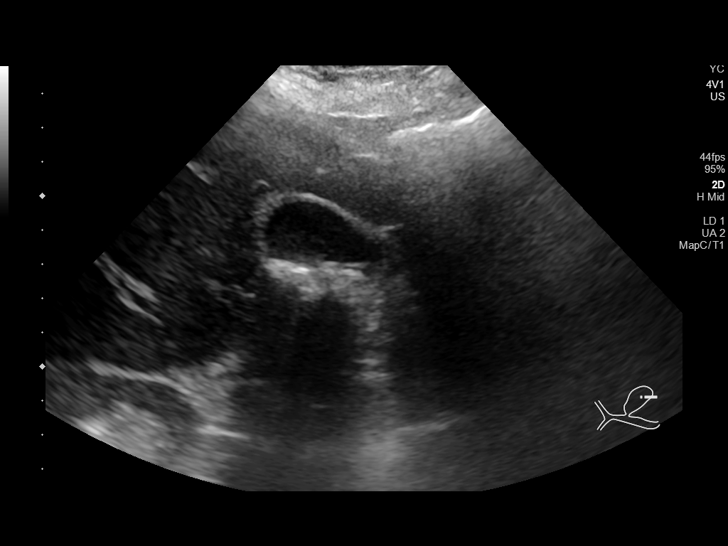
[im 18/39]
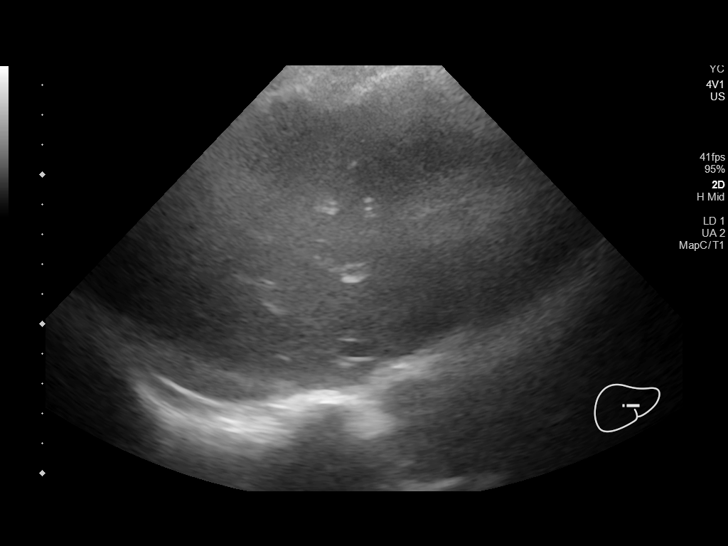
[im 21/39]
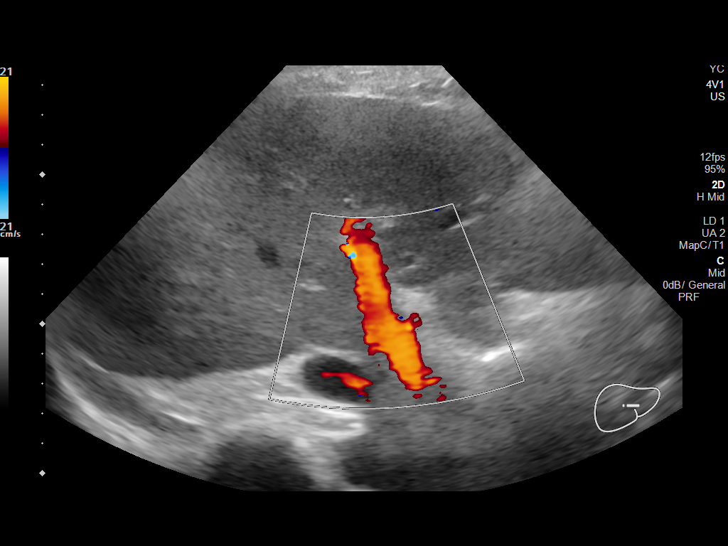
[im 24/39]
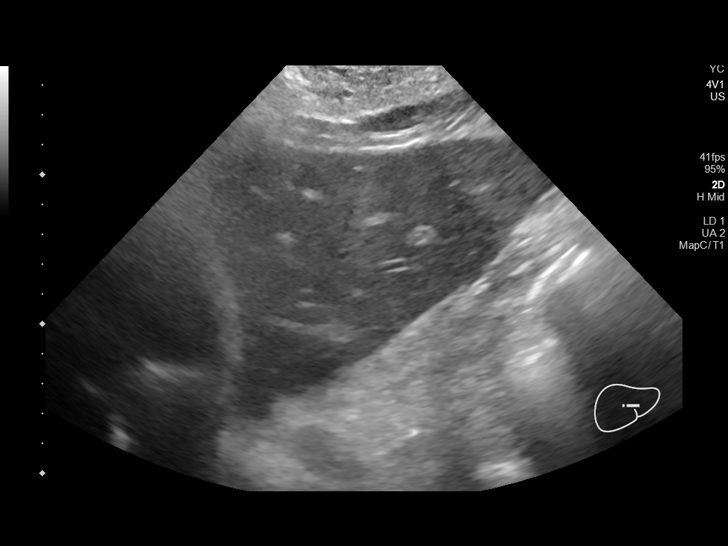
[im 26/39]
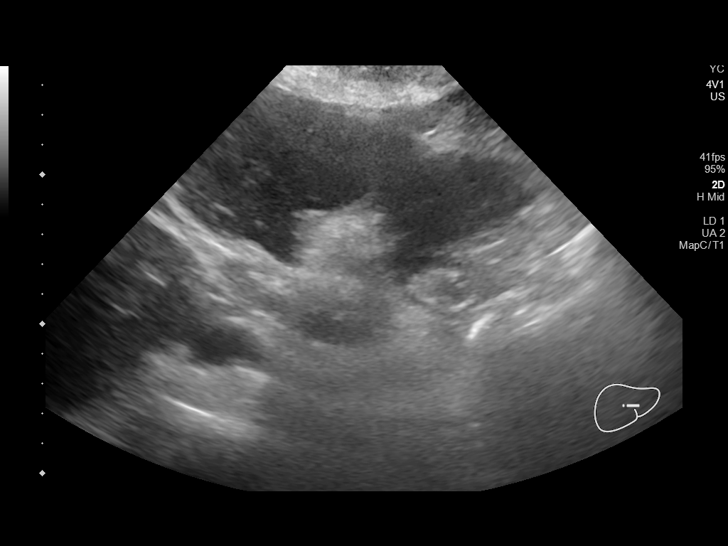
[im 29/39]
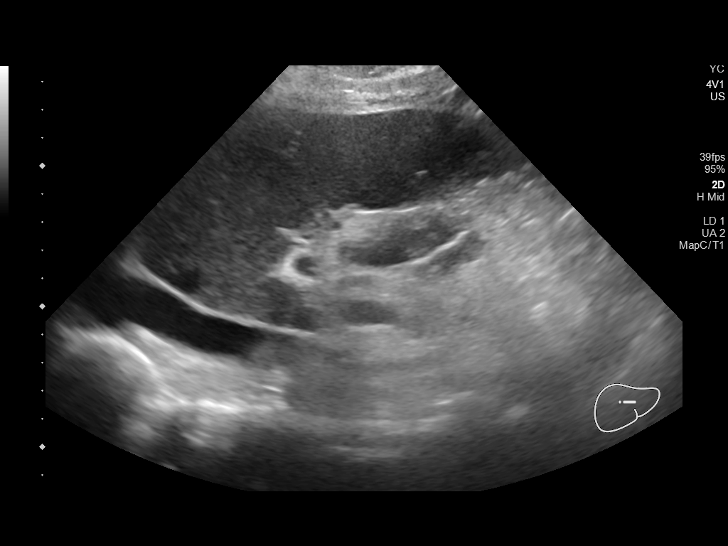
[im 32/39]
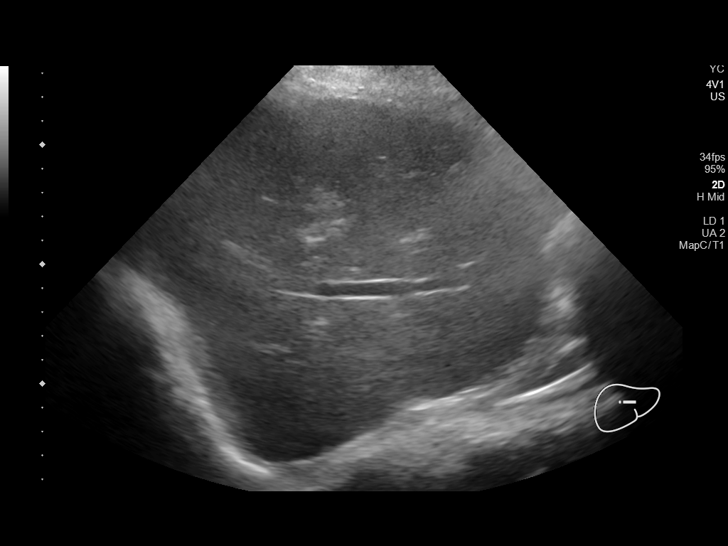
[im 35/39]
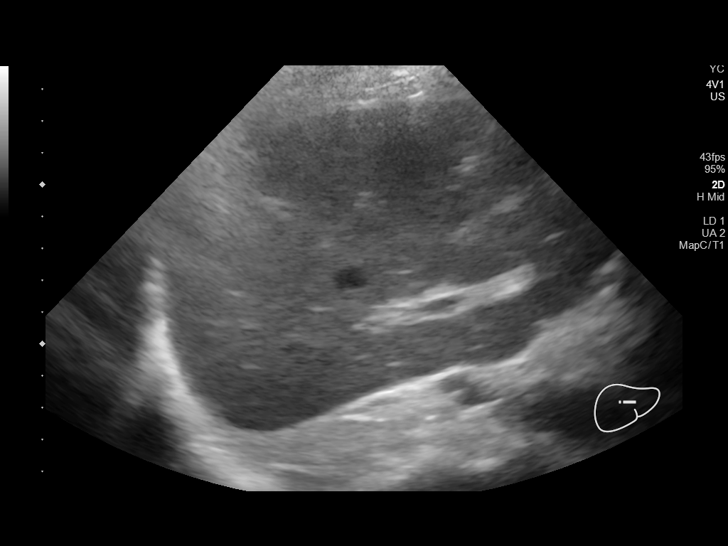
[im 39/39]
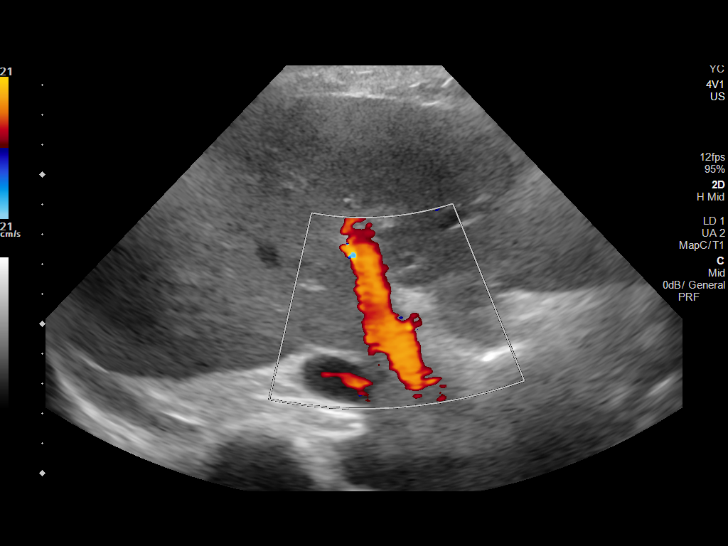

[14 of 25 positions shown; findings below may reference images not displayed]

FINDINGS: Gallbladder:

Multiple layering gallstones are present the largest measuring
cm. There is also layering sludge. No gallbladder wall thickening.
No sonographic Murphy sign noted by sonographer.

Common bile duct:

Diameter: 5.7 mm

Liver:

No focal lesion identified. Within normal limits in parenchymal
echogenicity. Portal vein is patent on color Doppler imaging with
normal direction of blood flow towards the liver.

Other: None.
IMPRESSION: Cholelithiasis without evidence of acute cholecystitis.

Normal appearing visualized portion of the liver.

## 2019-10-01 MED ORDER — LORAZEPAM 0.5 MG PO TABS
0.5000 mg | ORAL_TABLET | Freq: Every day | ORAL | Status: DC
  Administered 2019-10-02: 0.5 mg via ORAL
  Filled 2019-10-01: qty 1

## 2019-10-01 MED ORDER — DIPHENHYDRAMINE HCL 25 MG PO CAPS: 25.0000 mg | ORAL_CAPSULE | Freq: Every evening | ORAL | Status: DC | PRN

## 2019-10-01 MED ORDER — POTASSIUM CHLORIDE IN NACL 20-0.9 MEQ/L-% IV SOLN
INTRAVENOUS | Status: AC
  Filled 2019-10-01: qty 1000

## 2019-10-01 MED ORDER — SODIUM CHLORIDE 0.9 % IV SOLN: INTRAVENOUS | Status: DC

## 2019-10-01 MED ORDER — SODIUM CHLORIDE 0.9 % IV BOLUS
500.0000 mL | Freq: Once | INTRAVENOUS | Status: AC
  Administered 2019-10-01: 500 mL via INTRAVENOUS

## 2019-10-01 MED ORDER — NICOTINE 14 MG/24HR TD PT24
14.0000 mg | MEDICATED_PATCH | Freq: Every day | TRANSDERMAL | Status: DC
  Filled 2019-10-01: qty 1

## 2019-10-01 NOTE — Progress Notes (Signed)
Patient rates pain @ 6 out of 10. Pain medication given per MAR. Nausea also noted. Zofran given per MAR. Pt bed lowered and locked with call bell in reach. Bed alarm on. Will continue to monitor patient.

## 2019-10-01 NOTE — Plan of Care (Signed)
  Problem: Safety: Goal: Ability to remain free from injury will improve Outcome: Progressing   Problem: Pain Managment: Goal: General experience of comfort will improve Outcome: Progressing   Problem: Coping: Goal: Level of anxiety will decrease Outcome: Progressing   

## 2019-10-01 NOTE — Progress Notes (Signed)
Noted patient has confusion during the shift, noted after given IVfentanyl; pt was also restless and anxious, has multiple trips to the bathroom during the night, also noted has unsteady gait d/t drowsiness. pt was constantly encouraged to seek assistance d/t risk of fall but seems pt forget or not cooperating; will continue to monitor pt.

## 2019-10-01 NOTE — Progress Notes (Signed)
PROGRESS NOTE    Brittany Hobbs  [id number] DOB: 1961-08-11 DOA: 09/30/2019 PCP: Berkley Harvey, NP   Brief Narrative: 59 y.o. female with medical history significant for lupus, anxiety with panic attacks, and chronic pain, who presented to emergency department with abdominal pain, fevers, and positive blood culture after recent diverticulitis diagnosis.  Patient had been experiencing severe pain in the left lower abdomen, was febrile to 101.6 F at home, was seen in the emergency department for this on 09/29/2019, had CT evidence for acute sigmoid diverticulitis without complication, and was sent home with Augmentin.  She continues to have pain and subjective fevers, and she was called today after a blood culture was found to be growing E. coli.  She has no new complaints at this time and denies any cough, shortness of breath, leg swelling, leg tenderness, or chest pain.  Patient has not on any regularly scheduled medications for her lupus but reports that she is managed with steroids as needed for flares and has not been on any steroid for approximately a month or so.  ED Course: Upon arrival to the ED, patient is found to be afebrile, saturating well on room air, and with stable blood pressure.  Chemistry panel notable for potassium 3.4, mild elevation in transaminases, total bilirubin 2.0, and creatinine 1.13, up from 0.75 the day prior.  CBC notable for a new slight thrombocytopenia.  Blood cultures were repeated in the ED, Covid PCR test is pending, the patient was treated with morphine, Zofran, 2 g IV Rocephin, and Flagyl.   Assessment & Plan:   Principal Problem:   Acute diverticulitis Active Problems:   Anxiety   E coli bacteremia   Thrombocytopenia (HCC)   AKI (acute kidney injury) ([place])   LFT elevation     1.   E. coli bacteremia with sigmoid diverticulitis with no abscess or perforation.  Blood cultures from 09/29/2019 shows E. coli sensitivities pending.  Continue  Rocephin.    2. AKI -creatinine today is 1.18 which is up from 1.13 at the time of admission which is up from 0.75 on 09/29/2019. Will continue IV fluids and repeat labs in the morning. - Kidneys and ureters appeared normal on CT 09/29/19   3. Elevated transaminases and bilirubin  - New elevation in transaminases and increased total bilirubin noted on admission  - CT abd/pelvis from 09/29/19 without any focal liver lesions but multiple gall stones noted  -Follow-up LFTs in a.m. Right upper quadrant ultrasound with cholelithiasis without evidence of acute cholecystitis.  4. Thrombocytopenia -new since 09/29/2019.  Question related to Augmentin that she was taking at home however I will follow-up in a.m.  She is on aspirin and Lovenox.  I will DC aspirin.  5. Anxiety  - Continue Lexapro and Ativan    6. Lupus   - Managed with steroids for flares, currently asymptomatic   Estimated body mass index is 23.89 kg/m as calculated from the following:   Height as of this encounter: 5\' 6"  (1.676 m).   Weight as of this encounter: 67.1 kg.  DVT prophylaxis: Lovenox Code Status full code Family Communication: None Disposition Plan: Pending culture sensitivities and resolution of abdominal pain that she is still on clear liquid diet with ongoing abdominal pain.   Consultants:   None  Procedures: None Antimicrobials: Rocephin  Subjective: Patient resting in bed complaining of abdominal pain was confused overnight likely after taking fentanyl on clear liquids no nausea vomiting had some diarrhea  Objective: Vitals:  09/30/19 2230 09/30/19 2300 09/30/19 2346 10/01/19 0619  BP: 105/62 100/80 106/63 (!) 99/50  Pulse: 90 (!) 101 100 87  Resp: 18 18 18 18   Temp:   99 F (37.2 C) 99.7 F (37.6 C)  TempSrc:   Oral Oral  SpO2: 98% 96% 97% 100%  Weight:      Height:        Intake/Output Summary (Last 24 hours) at 10/01/2019 1411 Last data filed at 10/01/2019 1000 Gross per 24 hour    Intake 1774.25 ml  Output 100 ml  Net 1674.25 ml   Filed Weights   09/30/19 1945  Weight: 67.1 kg    Examination:  General exam: Appears calm and comfortable  Respiratory system: Clear to auscultation. Respiratory effort normal. Cardiovascular system: S1 & S2 heard, RRR. No JVD, murmurs, rubs, gallops or clicks. No pedal edema. Gastrointestinal system: Abdomen is nondistended, soft and left lower quadrant tender. No organomegaly or masses felt. Normal bowel sounds heard. Central nervous system: Alert and oriented. No focal neurological deficits. Extremities: Symmetric 5 x 5 power. Skin: No rashes, lesions or ulcers Psychiatry: Judgement and insight appear normal. Mood & affect appropriate.     Data Reviewed: I have personally reviewed following labs and imaging studies  CBC: Recent Labs  Lab 09/29/19 1403 09/30/19 2127 10/01/19 0447  WBC 10.1 5.2 5.5  NEUTROABS 9.3* 4.7 4.9  HGB 15.7* 16.0* 14.9  HCT 48.4* 50.1* 45.8  MCV 88.3 87.4 88.1  PLT 184 147* 0000000*   Basic Metabolic Panel: Recent Labs  Lab 09/29/19 1401 09/30/19 2127 10/01/19 0447  NA 136 137 137  K 3.6 3.4* 3.7  CL 103 101 103  CO2 20* 27 23  GLUCOSE 109* 105* 138*  BUN 23* 30* 30*  CREATININE 0.75 1.13* 1.18*  CALCIUM 8.8* 8.7* 8.1*  MG  --   --  2.0   GFR: Estimated Creatinine Clearance: 49.2 mL/min (A) (by C-G formula based on SCr of 1.18 mg/dL (H)). Liver Function Tests: Recent Labs  Lab 09/29/19 1401 09/30/19 2127 10/01/19 0447  AST 31 110* 84*  ALT 19 71* 64*  ALKPHOS 94 93 98  BILITOT 1.7* 2.0* 1.0  PROT 7.5 6.9 6.4*  ALBUMIN 4.1 3.4* 3.2*   Recent Labs  Lab 09/29/19 1401  LIPASE 21   No results for input(s): AMMONIA in the last 168 hours. Coagulation Profile: No results for input(s): INR, PROTIME in the last 168 hours. Cardiac Enzymes: No results for input(s): CKTOTAL, CKMB, CKMBINDEX, TROPONINI in the last 168 hours. BNP (last 3 results) No results for input(s): PROBNP  in the last 8760 hours. HbA1C: No results for input(s): HGBA1C in the last 72 hours. CBG: No results for input(s): GLUCAP in the last 168 hours. Lipid Profile: No results for input(s): CHOL, HDL, LDLCALC, TRIG, CHOLHDL, LDLDIRECT in the last 72 hours. Thyroid Function Tests: No results for input(s): TSH, T4TOTAL, FREET4, T3FREE, THYROIDAB in the last 72 hours. Anemia Panel: No results for input(s): VITAMINB12, FOLATE, FERRITIN, TIBC, IRON, RETICCTPCT in the last 72 hours. Sepsis Labs: Recent Labs  Lab 09/29/19 1403  LATICACIDVEN 1.9    Recent Results (from the past 240 hour(s))  Culture, blood (routine x 2)     Status: Abnormal (Preliminary result)   Collection Time: 09/29/19  2:03 PM   Specimen: BLOOD  Result Value Ref Range Status   Specimen Description   Final    BLOOD RIGHT ANTECUBITAL Performed at [facility] [name] [place], [place]  [address]    Special Requests   Final    BOTTLES DRAWN AEROBIC AND ANAEROBIC Blood Culture adequate volume Performed at [facility] [address] [place], [place] [address]    Culture  Setup Time   Final    GRAM NEGATIVE RODS AEROBIC BOTTLE ONLY Organism ID to follow CRITICAL RESULT CALLED TO, READ BACK BY AND VERIFIED WITH: Laurena Slimmer RN 11:55 09/30/19 ([email or web address]) Performed at [facility], [place] [address] [place], [place] [address]    Culture ESCHERICHIA COLI (A)  Final   Report Status PENDING  Incomplete  Culture, blood (routine x 2)     Status: None (Preliminary result)   Collection Time: 09/29/19  2:03 PM   Specimen: BLOOD RIGHT HAND  Result Value Ref Range Status   Specimen Description   Final    BLOOD RIGHT HAND Performed at [facility] [address], [place], [place] [address]    Special Requests   Final    BOTTLES DRAWN AEROBIC AND ANAEROBIC Blood Culture adequate volume Performed at [facility] [address], [place],  [place] [address]    Culture   Final    NO GROWTH 2 DAYS Performed at [facility] [address], [place],  [address]    Report Status PENDING  Incomplete  Blood Culture ID Panel (Reflexed)     Status: Abnormal   Collection Time: 09/29/19  2:03 PM  Result Value Ref Range Status   Enterococcus species NOT DETECTED NOT DETECTED Final   Listeria monocytogenes NOT DETECTED NOT DETECTED Final   Staphylococcus species NOT DETECTED NOT DETECTED Final   Staphylococcus aureus (BCID) NOT DETECTED NOT DETECTED Final   Streptococcus species NOT DETECTED NOT DETECTED Final   Streptococcus agalactiae NOT DETECTED NOT DETECTED Final   Streptococcus pneumoniae NOT DETECTED NOT DETECTED Final   Streptococcus pyogenes NOT DETECTED NOT DETECTED Final   Acinetobacter baumannii NOT DETECTED NOT DETECTED Final   Enterobacteriaceae species DETECTED (A) NOT DETECTED Final    Comment: Enterobacteriaceae represent a large family of gram-negative bacteria, not a single organism. CRITICAL RESULT CALLED TO, READ BACK BY AND VERIFIED WITH: Laurena Slimmer RN 11:55 09/30/19 ([email or web address])    Enterobacter cloacae complex NOT DETECTED NOT DETECTED Final   Escherichia coli DETECTED (A) NOT DETECTED Final    Comment: CRITICAL RESULT CALLED TO, READ BACK BY AND VERIFIED WITH: Laurena Slimmer RN 11:55 09/30/19 ([email or web address])    Klebsiella oxytoca NOT DETECTED NOT DETECTED Final   Klebsiella pneumoniae NOT DETECTED NOT DETECTED Final   Proteus species NOT DETECTED NOT DETECTED Final   Serratia marcescens NOT DETECTED NOT DETECTED Final   Carbapenem resistance NOT DETECTED NOT DETECTED Final   Haemophilus influenzae NOT DETECTED NOT DETECTED Final   Neisseria meningitidis NOT DETECTED NOT DETECTED Final   Pseudomonas aeruginosa NOT DETECTED NOT DETECTED Final   Candida albicans NOT DETECTED NOT DETECTED Final   Candida glabrata NOT DETECTED NOT DETECTED Final   Candida krusei NOT DETECTED NOT DETECTED Final   Candida parapsilosis NOT  DETECTED NOT DETECTED Final   Candida tropicalis NOT DETECTED NOT DETECTED Final    Comment: Performed at [facility], [place] [address], [place], [place] [address]  SARS CORONAVIRUS 2 (TAT 6-24 HRS) Nasopharyngeal Nasopharyngeal Swab     Status: None   Collection Time: 09/30/19  9:55 PM   Specimen: Nasopharyngeal Swab  Result Value Ref Range Status   SARS Coronavirus 2 NEGATIVE NEGATIVE Final  Comment: (NOTE) SARS-CoV-2 target nucleic acids are NOT DETECTED. The SARS-CoV-2 RNA is generally detectable in upper and lower respiratory specimens during the acute phase of infection. Negative results do not preclude SARS-CoV-2 infection, do not rule out co-infections with other pathogens, and should not be used as the sole basis for treatment or other patient management decisions. Negative results must be combined with clinical observations, patient history, and epidemiological information. The expected result is Negative. Fact Sheet for Patients: SugarRoll.be Fact Sheet for Healthcare Providers: https://www.woods-[employer]ge[employer] [employer]elz.com/ This test is not yet approved or cleared by the [place] FDA and  has been authorized for detection and/or diagnosis of SARS-CoV-2 by FDA under an Emergency Use Authorization (EUA). This EUA will remain  in effect (meaning this test can be used) for the duration of the COVID-19 declaration under Section 56 4(b)(1) of the Act, 21 U.S.C. section 360bbb-3(b)(1), unless the authorization is terminated or revoked sooner. Performed at [facility], [place] [address], [place], [place] [address]          Radiology Studies: CT ABDOMEN PELVIS WO CONTRAST  Result Date: 09/29/2019 CLINICAL DATA:  Diverticulitis suspected Oral contrast only history of ulcers and diverticulitis, left sided abd pain, sharp pain that comes and goes. N/V Fever 101.6 Diverticulitis suspected EXAM: CT ABDOMEN AND PELVIS WITHOUT CONTRAST  TECHNIQUE: Multidetector CT imaging of the abdomen and pelvis was performed following the standard protocol without IV contrast. COMPARISON:  CT 08/10/2018 FINDINGS: Lower chest: Lung bases are clear. Hepatobiliary: No focal hepatic lesion on noncontrast exam. Multiple gallstones noted. Pancreas: Pancreas is normal. No ductal dilatation. No pancreatic inflammation. Spleen: Normal spleen Adrenals/urinary tract: Adrenal glands and kidneys are normal. The ureters and bladder normal. Stomach/Bowel: Stomach, small bowel, appendix, and cecum are normal. Ascending and transverse colon normal. There are diverticula descending colon. There is thickening through the proximal sigmoid colon and mild Peri colonic fat stranding. This is similar to comparison CT of 08/10/2018 with there was an inflammatory collection which is resolved. There is no extraluminal gas or fluid collections currently. Only inflammatory stranding through this region of diverticulosis and thickened bowel (image 58/2) Vascular/Lymphatic: Abdominal aorta is normal caliber with atherosclerotic calcification. There is no retroperitoneal or periportal lymphadenopathy. No pelvic lymphadenopathy. Reproductive: Uterus and adnexa are normal. Other: No free fluid. Musculoskeletal: No aggressive osseous lesion. IMPRESSION: Mild acute diverticulitis of the proximal sigmoid colon. No evidence of abscess or perforation. Persistent thickening of the proximal sigmoid colon through the region of inflammation similar to this CT of 08/10/2018. Recommend follow-up colonoscopy to exclude underlying mucosal lesion. Electronically Signed   By: Suzy Bouchard M.D.   On: 09/29/2019 16:31   US Abdomen Limited RUQ  Result Date: 10/01/2019 CLINICAL DATA:  Elevated LFTs EXAM: ULTRASOUND ABDOMEN LIMITED RIGHT UPPER QUADRANT COMPARISON:  CT September 29, 2019 FINDINGS: Gallbladder: Multiple layering gallstones are present the largest measuring 1.5 cm. There is also layering sludge.  No gallbladder wall thickening. No sonographic Murphy sign noted by sonographer. Common bile duct: Diameter: 5.7 mm Liver: No focal lesion identified. Within normal limits in parenchymal echogenicity. Portal vein is patent on color Doppler imaging with normal direction of blood flow towards the liver. Other: None. IMPRESSION: Cholelithiasis without evidence of acute cholecystitis. Normal appearing visualized portion of the liver. Electronically Signed   By: Prudencio Pair M.D.   On: 10/01/2019 06:11        Scheduled Meds: . aspirin EC  81 mg Oral Daily  . enoxaparin (LOVENOX) injection  40 mg  Subcutaneous Q24H  . escitalopram  20 mg Oral Daily  . gabapentin  400 mg Oral TID  . [START ON 10/02/2019] LORazepam  0.5 mg Oral Daily  . LORazepam  1 mg Oral QHS  . nicotine  14 mg Transdermal Daily   Continuous Infusions: . cefTRIAXone (ROCEPHIN)  IV       LOS: 1 day     Georgette Shell, MD [facility]  If 7PM-7AM, please contact night-coverage [email or web address] Password [facility] 10/01/2019, 2:11 PM

## 2019-10-02 LAB — CBC
HCT: 43.6 % (ref 36.0–46.0)
Hemoglobin: 13.9 g/dL (ref 12.0–15.0)
MCH: 28.7 pg (ref 26.0–34.0)
MCHC: 31.9 g/dL (ref 30.0–36.0)
MCV: 90.1 fL (ref 80.0–100.0)
Platelets: 121 10*3/uL — ABNORMAL LOW (ref 150–400)
RBC: 4.84 MIL/uL (ref 3.87–5.11)
RDW: 15.7 % — ABNORMAL HIGH (ref 11.5–15.5)
WBC: 6.2 10*3/uL (ref 4.0–10.5)
nRBC: 0 % (ref 0.0–0.2)

## 2019-10-02 LAB — COMPREHENSIVE METABOLIC PANEL
ALT: 43 U/L (ref 0–44)
AST: 45 U/L — ABNORMAL HIGH (ref 15–41)
Albumin: 2.8 g/dL — ABNORMAL LOW (ref 3.5–5.0)
Alkaline Phosphatase: 141 U/L — ABNORMAL HIGH (ref 38–126)
Anion gap: 10 (ref 5–15)
BUN: 23 mg/dL — ABNORMAL HIGH (ref 6–20)
CO2: 23 mmol/L (ref 22–32)
Calcium: 8.2 mg/dL — ABNORMAL LOW (ref 8.9–10.3)
Chloride: 105 mmol/L (ref 98–111)
Creatinine, Ser: 0.75 mg/dL (ref 0.44–1.00)
GFR calc Af Amer: >60 mL/min (ref >60)
GFR calc non Af Amer: >60 mL/min (ref >60)
Glucose, Bld: 88 mg/dL (ref 70–99)
Potassium: 4.1 mmol/L (ref 3.5–5.1)
Sodium: 138 mmol/L (ref 135–145)
Total Bilirubin: 0.9 mg/dL (ref 0.3–1.2)
Total Protein: 6 g/dL — ABNORMAL LOW (ref 6.5–8.1)

## 2019-10-02 LAB — LIPASE, BLOOD: Lipase: 19 U/L (ref 11–51)

## 2019-10-02 MED ORDER — NICOTINE 14 MG/24HR TD PT24: 14.0000 mg | MEDICATED_PATCH | Freq: Every day | TRANSDERMAL | 0 refills | Status: DC

## 2019-10-02 MED ORDER — AMOXICILLIN-POT CLAVULANATE 875-125 MG PO TABS: 1.0000 | ORAL_TABLET | Freq: Two times a day (BID) | ORAL | 0 refills | Status: DC

## 2019-10-02 NOTE — Discharge Summary (Signed)
Physician Discharge Summary  Brittany [place] [id number] DOB: 04-13-1961 DOA: 09/30/2019  PCP: Berkley Harvey, NP  Admit date: 09/30/2019 Discharge date: 10/02/2019  Admitted From: Home Disposition: Home Recommendations for Outpatient Follow-up:  1. Follow up with PCP in 1-2 weeks 2. Please obtain BMP/CBC in one week   Home Health none Equipment/Devices: None Discharge Condition: Stable and improved  CODE STATUS: Full code  diet recommendation: Cardiac diet Brief/Interim Summary: 59 y.o.femalewith medical history significant forlupus, anxiety with panic attacks, and chronic pain, who presented to emergency department with abdominal pain, fevers, and positive blood culture after recent diverticulitis diagnosis. Patient had been experiencing severe pain in the left lower abdomen, was febrile to 101.6 Fat home,was seen in the emergency department for this on 09/29/2019, had CT evidence for acute sigmoid diverticulitis without complication, and was sent home with Augmentin. She continues to have pain and subjective fevers, and she was called today after a blood culture was found to be growing E. coli. She has no new complaints at this time and denies any cough, shortness of breath, leg swelling, leg tenderness, or chest pain. Patient has not on any regularlyscheduled medications for her lupus but reports that she is managed with steroids as needed for flares and has not been on any steroid for approximately a month or so.  ED Course:Upon arrival to the ED, patient is found to be afebrile, saturating well on room air, and with stable blood pressure. Chemistry panel notable for potassium 3.4, mild elevation in transaminases, total bilirubin 2.0, and creatinine 1.13, up from 0.75 the day prior. CBC notable for a new slight thrombocytopenia. Blood cultures were repeated in the ED, Covid PCR test is pending, the patient was treated with morphine, Zofran, 2 g IV Rocephin, and  Flagyl.    Discharge Diagnoses:  Principal Problem:   Acute diverticulitis Active Problems:   Anxiety   E coli bacteremia   Thrombocytopenia (HCC)   AKI (acute kidney injury) ([place])   LFT elevation   1.  E. coli bacteremia with sigmoid diverticulitis with no abscess or perforation.  Blood cultures from 09/29/2019 shows E. coli sensitivities pending.  Patient is very anxious and adamant about going home we will discharge her on Augmentin.   Tolerating soft diet prior to discharge.   2.AKI-creatinine today is 0.75 down from 1.18 which is up from 1.13 at the time of admission which is up from 0.75 on 09/29/2019. She was treated with IV fluids. - Kidneys and ureters appeared normal on CT 09/29/19  3.Elevated transaminases and bilirubin -New elevation in transaminases and increased total bilirubin noted on admission -CT abd/pelvis from 09/29/19 without any focal liver lesions but multiple gall stones noted -Follow-up LFTs AST 45 ALT 43 total bilirubin 0.9 on the day of discharge. Right upper quadrant ultrasound with cholelithiasis without evidence of acute cholecystitis.  4.Thrombocytopenia-new since 09/29/2019.  Platelets 121 on discharge.   5.Anxiety -Continue Lexapro and Ativan  6.Lupus -Managed with steroids for flares, currently asymptomatic  Estimated body mass index is 23.89 kg/m as calculated from the following:   Height as of this encounter: 5\' 6"  (1.676 m).   Weight as of this encounter: 67.1 kg.  Discharge Instructions  Discharge Instructions    Call MD for:  difficulty breathing, headache or visual disturbances   Complete by: As directed    Call MD for:  persistant nausea and vomiting   Complete by: As directed    Call MD for:  severe uncontrolled pain  Complete by: As directed    Call MD for:  temperature >100.4   Complete by: As directed    Diet - low sodium heart healthy   Complete by: As directed    Increase activity slowly    Complete by: As directed      Allergies as of 10/02/2019      Reactions   Iodine Anaphylaxis   Pt states she is allergic to INTERNAL IODINE   Betadine [povidone Iodine] Hives   Sulfa Antibiotics    N/V      Medication List    TAKE these medications   amoxicillin-clavulanate 875-125 MG tablet Commonly known as: AUGMENTIN Take 1 tablet by mouth every 12 (twelve) hours.   aspirin EC 81 MG tablet Take 81 mg by mouth daily.   dicyclomine 20 MG tablet Commonly known as: BENTYL Take 1 tablet (20 mg total) by mouth 2 (two) times daily.   diphenhydrAMINE 50 MG tablet Commonly known as: BENADRYL Take 50 mg by mouth at bedtime.   escitalopram 20 MG tablet Commonly known as: LEXAPRO Take 20 mg by mouth daily.   fluticasone 50 MCG/ACT nasal spray Commonly known as: FLONASE Place 1 spray into both nostrils as needed for allergies.   gabapentin 400 MG capsule Commonly known as: NEURONTIN Take 400 mg by mouth 3 (three) times daily.   HYDROcodone-acetaminophen 5-325 MG tablet Commonly known as: NORCO/VICODIN Take 1-2 tablets by mouth every 6 (six) hours as needed for severe pain.   LORazepam 0.5 MG tablet Commonly known as: ATIVAN Take 0.5-1 mg by mouth See admin instructions. 0.5mg  in AM and 1mg  in evening   naproxen sodium 220 MG tablet Commonly known as: ALEVE Take 220 mg by mouth daily as needed (back pain.).   nicotine 14 mg/24hr patch Commonly known as: NICODERM CQ - dosed in mg/24 hours Place 1 patch (14 mg total) onto the skin daily. Start taking on: October 03, 2019   ondansetron 4 MG disintegrating tablet Commonly known as: Zofran ODT Take 1 tablet (4 mg total) by mouth every 8 (eight) hours as needed for nausea or vomiting.   SOLUBLE FIBER/PROBIOTICS PO Take by mouth daily.      Follow-up Information    Berkley Harvey, NP Follow up.   Specialty: Nurse Practitioner Contact information: [address] [place] [address] [phone]           Allergies  Allergen Reactions  . Iodine Anaphylaxis    Pt states she is allergic to INTERNAL IODINE  . Betadine [Povidone Iodine] Hives  . Sulfa Antibiotics     N/V    Consultations: None  Procedures/Studies: CT ABDOMEN PELVIS WO CONTRAST  Result Date: 09/29/2019 CLINICAL DATA:  Diverticulitis suspected Oral contrast only history of ulcers and diverticulitis, left sided abd pain, sharp pain that comes and goes. N/V Fever 101.6 Diverticulitis suspected EXAM: CT ABDOMEN AND PELVIS WITHOUT CONTRAST TECHNIQUE: Multidetector CT imaging of the abdomen and pelvis was performed following the standard protocol without IV contrast. COMPARISON:  CT 08/10/2018 FINDINGS: Lower chest: Lung bases are clear. Hepatobiliary: No focal hepatic lesion on noncontrast exam. Multiple gallstones noted. Pancreas: Pancreas is normal. No ductal dilatation. No pancreatic inflammation. Spleen: Normal spleen Adrenals/urinary tract: Adrenal glands and kidneys are normal. The ureters and bladder normal. Stomach/Bowel: Stomach, small bowel, appendix, and cecum are normal. Ascending and transverse colon normal. There are diverticula descending colon. There is thickening through the proximal sigmoid colon and mild Peri colonic fat stranding. This is similar  to comparison CT of 08/10/2018 with there was an inflammatory collection which is resolved. There is no extraluminal gas or fluid collections currently. Only inflammatory stranding through this region of diverticulosis and thickened bowel (image 58/2) Vascular/Lymphatic: Abdominal aorta is normal caliber with atherosclerotic calcification. There is no retroperitoneal or periportal lymphadenopathy. No pelvic lymphadenopathy. Reproductive: Uterus and adnexa are normal. Other: No free fluid. Musculoskeletal: No aggressive osseous lesion. IMPRESSION: Mild acute diverticulitis of the proximal sigmoid colon. No evidence of abscess or perforation. Persistent thickening of the  proximal sigmoid colon through the region of inflammation similar to this CT of 08/10/2018. Recommend follow-up colonoscopy to exclude underlying mucosal lesion. Electronically Signed   By: Suzy Bouchard M.D.   On: 09/29/2019 16:31   US Abdomen Limited RUQ  Result Date: 10/01/2019 CLINICAL DATA:  Elevated LFTs EXAM: ULTRASOUND ABDOMEN LIMITED RIGHT UPPER QUADRANT COMPARISON:  CT September 29, 2019 FINDINGS: Gallbladder: Multiple layering gallstones are present the largest measuring 1.5 cm. There is also layering sludge. No gallbladder wall thickening. No sonographic Murphy sign noted by sonographer. Common bile duct: Diameter: 5.7 mm Liver: No focal lesion identified. Within normal limits in parenchymal echogenicity. Portal vein is patent on color Doppler imaging with normal direction of blood flow towards the liver. Other: None. IMPRESSION: Cholelithiasis without evidence of acute cholecystitis. Normal appearing visualized portion of the liver. Electronically Signed   By: Prudencio Pair M.D.   On: 10/01/2019 06:11    (Echo, Carotid, EGD, Colonoscopy, ERCP)    Subjective: Patient sitting up in bed anxious and adamant about going home today.  She is tolerating a soft diet. Discharge Exam: Vitals:   10/01/19 2129 10/02/19 0401  BP: (!) 143/88 (!) 148/103  Pulse: 78 67  Resp: 18 17  Temp: 98.1 F (36.7 C) 98.1 F (36.7 C)  SpO2: 96% 98%   Vitals:   10/01/19 0619 10/01/19 1400 10/01/19 2129 10/02/19 0401  BP: (!) 99/50 109/62 (!) 143/88 (!) 148/103  Pulse: 87 86 78 67  Resp: 18 16 18 17   Temp: 99.7 F (37.6 C) 99.6 F (37.6 C) 98.1 F (36.7 C) 98.1 F (36.7 C)  TempSrc: Oral Oral Oral Oral  SpO2: 100% 93% 96% 98%  Weight:      Height:        General: Pt is alert, awake, not in acute distress Cardiovascular: RRR, S1/S2 +, no rubs, no gallops Respiratory: CTA bilaterally, no wheezing, no rhonchi Abdominal: Soft, NT, ND, bowel sounds + Extremities: no edema, no cyanosis    The  results of significant diagnostics from this hospitalization (including imaging, microbiology, ancillary and laboratory) are listed below for reference.     Microbiology: Recent Results (from the past 240 hour(s))  Culture, blood (routine x 2)     Status: Abnormal (Preliminary result)   Collection Time: 09/29/19  2:03 PM   Specimen: BLOOD  Result Value Ref Range Status   Specimen Description   Final    BLOOD RIGHT ANTECUBITAL Performed at [facility] [address] [place], [place] [address]    Special Requests   Final    BOTTLES DRAWN AEROBIC AND ANAEROBIC Blood Culture adequate volume Performed at [facility] [address], [place], [place] [address]    Culture  Setup Time   Final    GRAM NEGATIVE RODS AEROBIC BOTTLE ONLY CRITICAL RESULT CALLED TO, READ BACK BY AND VERIFIED WITH: Laurena Slimmer RN 11:55 09/30/19 ([email or web address])    Culture (A)  Final  ESCHERICHIA COLI REPEATING SUSCEPTIBILTIES Performed at [facility], [place] [address] [place], [place] [address]    Report Status PENDING  Incomplete  Culture, blood (routine x 2)     Status: None (Preliminary result)   Collection Time: 09/29/19  2:03 PM   Specimen: BLOOD RIGHT HAND  Result Value Ref Range Status   Specimen Description   Final    BLOOD RIGHT HAND Performed at [facility] [address], [place], [place] [address]    Special Requests   Final    BOTTLES DRAWN AEROBIC AND ANAEROBIC Blood Culture adequate volume Performed at [facility] [address] [place], [place] [address]    Culture   Final    NO GROWTH 3 DAYS Performed at [facility], [place] [address], [place], [place] [address]    Report Status PENDING  Incomplete  Blood Culture ID Panel (Reflexed)     Status: Abnormal   Collection Time: 09/29/19  2:03 PM  Result Value Ref Range Status   Enterococcus species NOT DETECTED NOT DETECTED Final   Listeria monocytogenes  NOT DETECTED NOT DETECTED Final   Staphylococcus species NOT DETECTED NOT DETECTED Final   Staphylococcus aureus (BCID) NOT DETECTED NOT DETECTED Final   Streptococcus species NOT DETECTED NOT DETECTED Final   Streptococcus agalactiae NOT DETECTED NOT DETECTED Final   Streptococcus pneumoniae NOT DETECTED NOT DETECTED Final   Streptococcus pyogenes NOT DETECTED NOT DETECTED Final   Acinetobacter baumannii NOT DETECTED NOT DETECTED Final   Enterobacteriaceae species DETECTED (A) NOT DETECTED Final    Comment: Enterobacteriaceae represent a large family of gram-negative bacteria, not a single organism. CRITICAL RESULT CALLED TO, READ BACK BY AND VERIFIED WITH: Laurena Slimmer RN 11:55 09/30/19 ([email or web address])    Enterobacter cloacae complex NOT DETECTED NOT DETECTED Final   Escherichia coli DETECTED (A) NOT DETECTED Final    Comment: CRITICAL RESULT CALLED TO, READ BACK BY AND VERIFIED WITH: Laurena Slimmer RN 11:55 09/30/19 ([email or web address])    Klebsiella oxytoca NOT DETECTED NOT DETECTED Final   Klebsiella pneumoniae NOT DETECTED NOT DETECTED Final   Proteus species NOT DETECTED NOT DETECTED Final   Serratia marcescens NOT DETECTED NOT DETECTED Final   Carbapenem resistance NOT DETECTED NOT DETECTED Final   Haemophilus influenzae NOT DETECTED NOT DETECTED Final   Neisseria meningitidis NOT DETECTED NOT DETECTED Final   Pseudomonas aeruginosa NOT DETECTED NOT DETECTED Final   Candida albicans NOT DETECTED NOT DETECTED Final   Candida glabrata NOT DETECTED NOT DETECTED Final   Candida krusei NOT DETECTED NOT DETECTED Final   Candida parapsilosis NOT DETECTED NOT DETECTED Final   Candida tropicalis NOT DETECTED NOT DETECTED Final    Comment: Performed at [facility], [place] [address] [place], [place] [address]  Blood culture (routine x 2)     Status: None (Preliminary result)   Collection Time: 09/30/19  9:29 PM   Specimen: BLOOD  Result Value Ref Range Status   Specimen Description   Final    BLOOD RIGHT  ANTECUBITAL Performed at [facility] [address] [place], [place] [address]    Special Requests   Final    BOTTLES DRAWN AEROBIC AND ANAEROBIC Blood Culture adequate volume Performed at [facility] [address], [place], [place] [address]    Culture   Final    NO GROWTH 1 DAY Performed at [facility], [place] [address], [place], [place] [address]  Report Status PENDING  Incomplete  Blood culture (routine x 2)     Status: None (Preliminary result)   Collection Time: 09/30/19  9:34 PM   Specimen: BLOOD  Result Value Ref Range Status   Specimen Description   Final    BLOOD LEFT HAND Performed at [facility] [address] [place], [place] [address]    Special Requests   Final    BOTTLES DRAWN AEROBIC AND ANAEROBIC Blood Culture adequate volume Performed at [facility] [address] [place] [address], [place], [place] [address]    Culture   Final    NO GROWTH 1 DAY Performed at [facility], [place] [address] [place], [place] [address]    Report Status PENDING  Incomplete  SARS CORONAVIRUS 2 (TAT 6-24 HRS) Nasopharyngeal Nasopharyngeal Swab     Status: None   Collection Time: 09/30/19  9:55 PM   Specimen: Nasopharyngeal Swab  Result Value Ref Range Status   SARS Coronavirus 2 NEGATIVE NEGATIVE Final    Comment: (NOTE) SARS-CoV-2 target nucleic acids are NOT DETECTED. The SARS-CoV-2 RNA is generally detectable in upper and lower respiratory specimens during the acute phase of infection. Negative results do not preclude SARS-CoV-2 infection, do not rule out co-infections with other pathogens, and should not be used as the sole basis for treatment or other patient management decisions. Negative results must be combined with clinical observations, patient history, and epidemiological information. The expected result is Negative. Fact Sheet for  Patients: SugarRoll.be Fact Sheet for Healthcare Providers: https://www.woods-Arrie Borrelli.com/ This test is not yet approved or cleared by the [place] FDA and  has been authorized for detection and/or diagnosis of SARS-CoV-2 by FDA under an Emergency Use Authorization (EUA). This EUA will remain  in effect (meaning this test can be used) for the duration of the COVID-19 declaration under Section 56 4(b)(1) of the Act, 21 U.S.C. section 360bbb-3(b)(1), unless the authorization is terminated or revoked sooner. Performed at [facility], [place] [address] [place] [address] [place],  [address]      Labs: BNP (last 3 results) No results for input(s): BNP in the last 8760 hours. Basic Metabolic Panel: Recent Labs  Lab 09/29/19 1401 09/30/19 2127 10/01/19 0447 10/02/19 0445  NA 136 137 137 138  K 3.6 3.4* 3.7 4.1  CL 103 101 103 105  CO2 20* 27 23 23   GLUCOSE 109* 105* 138* 88  BUN 23* 30* 30* 23*  CREATININE 0.75 1.13* 1.18* 0.75  CALCIUM 8.8* 8.7* 8.1* 8.2*  MG  --   --  2.0  --    Liver Function Tests: Recent Labs  Lab 09/29/19 1401 09/30/19 2127 10/01/19 0447 10/02/19 0445  AST 31 110* 84* 45*  ALT 19 71* 64* 43  ALKPHOS 94 93 98 141*  BILITOT 1.7* 2.0* 1.0 0.9  PROT 7.5 6.9 6.4* 6.0*  ALBUMIN 4.1 3.4* 3.2* 2.8*   Recent Labs  Lab 09/29/19 1401 10/02/19 0445  LIPASE 21 19   No results for input(s): AMMONIA in the last 168 hours. CBC: Recent Labs  Lab 09/29/19 1403 09/30/19 2127 10/01/19 0447 10/02/19 0445  WBC 10.1 5.2 5.5 6.2  NEUTROABS 9.3* 4.7 4.9  --   HGB 15.7* 16.0* 14.9 13.9  HCT 48.4* 50.1* 45.8 43.6  MCV 88.3 87.4 88.1 90.1  PLT 184 147* 128* 121*   Cardiac Enzymes: No results for input(s): CKTOTAL, CKMB, CKMBINDEX, TROPONINI in the last 168 hours. BNP: Invalid input(s): POCBNP CBG: No results for input(s):  GLUCAP in the last 168 hours. D-Dimer No results for input(s): DDIMER in the last 72  hours. Hgb A1c No results for input(s): HGBA1C in the last 72 hours. Lipid Profile No results for input(s): CHOL, HDL, LDLCALC, TRIG, CHOLHDL, LDLDIRECT in the last 72 hours. Thyroid function studies No results for input(s): TSH, T4TOTAL, T3FREE, THYROIDAB in the last 72 hours.  Invalid input(s): FREET3 Anemia work up No results for input(s): VITAMINB12, FOLATE, FERRITIN, TIBC, IRON, RETICCTPCT in the last 72 hours. Urinalysis    Component Value Date/Time   COLORURINE AMBER (A) 09/29/2019 1403   APPEARANCEUR HAZY (A) 09/29/2019 1403   LABSPEC 1.029 09/29/2019 1403   PHURINE 5.0 09/29/2019 1403   GLUCOSEU NEGATIVE 09/29/2019 1403   HGBUR MODERATE (A) 09/29/2019 1403   BILIRUBINUR NEGATIVE 09/29/2019 1403   KETONESUR 5 (A) 09/29/2019 1403   PROTEINUR 100 (A) 09/29/2019 1403   NITRITE NEGATIVE 09/29/2019 1403   LEUKOCYTESUR SMALL (A) 09/29/2019 1403   Sepsis Labs Invalid input(s): PROCALCITONIN,  WBC,  LACTICIDVEN Microbiology Recent Results (from the past 240 hour(s))  Culture, blood (routine x 2)     Status: Abnormal (Preliminary result)   Collection Time: 09/29/19  2:03 PM   Specimen: BLOOD  Result Value Ref Range Status   Specimen Description   Final    BLOOD RIGHT ANTECUBITAL Performed at [facility], [place] [address], [place], [place] [address]    Special Requests   Final    BOTTLES DRAWN AEROBIC AND ANAEROBIC Blood Culture adequate volume Performed at [facility] [address] [place], [place] [address]    Culture  Setup Time   Final    GRAM NEGATIVE RODS AEROBIC BOTTLE ONLY CRITICAL RESULT CALLED TO, READ BACK BY AND VERIFIED WITH: Laurena Slimmer RN 11:55 09/30/19 ([email or web address])    Culture (A)  Final    ESCHERICHIA COLI REPEATING SUSCEPTIBILTIES Performed at [facility], [place] [address] [place], [place] [address]    Report Status PENDING  Incomplete  Culture, blood (routine x 2)     Status: None (Preliminary result)    Collection Time: 09/29/19  2:03 PM   Specimen: BLOOD RIGHT HAND  Result Value Ref Range Status   Specimen Description   Final    BLOOD RIGHT HAND Performed at [facility] [address] [place], [place] [address]    Special Requests   Final    BOTTLES DRAWN AEROBIC AND ANAEROBIC Blood Culture adequate volume Performed at [facility] [address] [place], [place] [address]    Culture   Final    NO GROWTH 3 DAYS Performed at [facility], [place] [address], [place], [place] [address]    Report Status PENDING  Incomplete  Blood Culture ID Panel (Reflexed)     Status: Abnormal   Collection Time: 09/29/19  2:03 PM  Result Value Ref Range Status   Enterococcus species NOT DETECTED NOT DETECTED Final   Listeria monocytogenes NOT DETECTED NOT DETECTED Final   Staphylococcus species NOT DETECTED NOT DETECTED Final   Staphylococcus aureus (BCID) NOT DETECTED NOT DETECTED Final   Streptococcus species NOT DETECTED NOT DETECTED Final   Streptococcus agalactiae NOT DETECTED NOT DETECTED Final   Streptococcus pneumoniae NOT DETECTED NOT DETECTED Final   Streptococcus pyogenes NOT DETECTED NOT DETECTED Final   Acinetobacter baumannii NOT DETECTED NOT DETECTED Final   Enterobacteriaceae species DETECTED (A) NOT DETECTED Final    Comment: Enterobacteriaceae represent a large family of  gram-negative bacteria, not a single organism. CRITICAL RESULT CALLED TO, READ BACK BY AND VERIFIED WITH: Laurena Slimmer RN 11:55 09/30/19 ([email or web address])    Enterobacter cloacae complex NOT DETECTED NOT DETECTED Final   Escherichia coli DETECTED (A) NOT DETECTED Final    Comment: CRITICAL RESULT CALLED TO, READ BACK BY AND VERIFIED WITH: Laurena Slimmer RN 11:55 09/30/19 ([email or web address])    Klebsiella oxytoca NOT DETECTED NOT DETECTED Final   Klebsiella pneumoniae NOT DETECTED NOT DETECTED Final   Proteus species NOT DETECTED NOT DETECTED Final   Serratia marcescens NOT DETECTED NOT DETECTED  Final   Carbapenem resistance NOT DETECTED NOT DETECTED Final   Haemophilus influenzae NOT DETECTED NOT DETECTED Final   Neisseria meningitidis NOT DETECTED NOT DETECTED Final   Pseudomonas aeruginosa NOT DETECTED NOT DETECTED Final   Candida albicans NOT DETECTED NOT DETECTED Final   Candida glabrata NOT DETECTED NOT DETECTED Final   Candida krusei NOT DETECTED NOT DETECTED Final   Candida parapsilosis NOT DETECTED NOT DETECTED Final   Candida tropicalis NOT DETECTED NOT DETECTED Final    Comment: Performed at [facility], [place] [address] [place], [place] [address]  Blood culture (routine x 2)     Status: None (Preliminary result)   Collection Time: 09/30/19  9:29 PM   Specimen: BLOOD  Result Value Ref Range Status   Specimen Description   Final    BLOOD RIGHT ANTECUBITAL Performed at [facility] [address] [place], [place] [address]    Special Requests   Final    BOTTLES DRAWN AEROBIC AND ANAEROBIC Blood Culture adequate volume Performed at [facility] [address] [place], [place] [address]    Culture   Final    NO GROWTH 1 DAY Performed at [facility], [place] [address] [place], [place] [address]    Report Status PENDING  Incomplete  Blood culture (routine x 2)     Status: None (Preliminary result)   Collection Time: 09/30/19  9:34 PM   Specimen: BLOOD  Result Value Ref Range Status   Specimen Description   Final    BLOOD LEFT HAND Performed at [facility] [address], [place], [place] [address]    Special Requests   Final    BOTTLES DRAWN AEROBIC AND ANAEROBIC Blood Culture adequate volume Performed at [facility] [address], [place], [place] [address]    Culture   Final    NO GROWTH 1 DAY Performed at [facility], [place] [address] [place], [place] [address]    Report Status PENDING  Incomplete  SARS CORONAVIRUS 2 (TAT 6-24 HRS) Nasopharyngeal  Nasopharyngeal Swab     Status: None   Collection Time: 09/30/19  9:55 PM   Specimen: Nasopharyngeal Swab  Result Value Ref Range Status   SARS Coronavirus 2 NEGATIVE NEGATIVE Final    Comment: (NOTE) SARS-CoV-2 target nucleic acids are NOT DETECTED. The SARS-CoV-2 RNA is generally detectable in upper and lower respiratory specimens during the acute phase of infection. Negative results do not preclude SARS-CoV-2 infection, do not rule out co-infections with other pathogens, and should not be used as the sole basis for treatment or other patient management decisions. Negative results must be combined with clinical observations, patient history, and epidemiological information. The expected result is Negative. Fact Sheet for Patients: SugarRoll.be Fact Sheet for Healthcare Providers: https://www.woods-Laurann Mcmorris.com/ This test is not yet approved or cleared by the [place] FDA and  has  been authorized for detection and/or diagnosis of SARS-CoV-2 by FDA under an Emergency Use Authorization (EUA). This EUA will remain  in effect (meaning this test can be used) for the duration of the COVID-19 declaration under Section 56 4(b)(1) of the Act, 21 U.S.C. section 360bbb-3(b)(1), unless the authorization is terminated or revoked sooner. Performed at [facility], [place] [address] [address], [place], [place] [address]      Time coordinating discharge:  63minutes  SIGNED:   Georgette Shell, MD  [facility] 10/02/2019, 12:43 PM Pager   If 7PM-7AM, please contact night-coverage [email or web address] Password [email or web address]

## 2019-10-03 LAB — CULTURE, BLOOD (ROUTINE X 2): Special Requests: ADEQUATE

## 2019-10-04 ENCOUNTER — Telehealth: Payer: Self-pay | Admitting: *Deleted

## 2019-10-04 LAB — CULTURE, BLOOD (ROUTINE X 2)
Culture: NO GROWTH
Special Requests: ADEQUATE

## 2019-10-04 NOTE — Telephone Encounter (Signed)
Post ED Visit - Positive Culture Follow-up  Culture report reviewed by antimicrobial stewardship pharmacist: [place] Team []  Nathan Batchelder, Pharm.D. []  [address], Pharm.D., BCPS AQ-ID []  Heide Guile, Pharm.D., BCPS []  Parks Neptune, Pharm.D., BCPS []  [place], Pharm.D., BCPS, AAHIVP []  [place], Pharm.D., BCPS, AAHIVP []  Legrand Como, PharmD, BCPS []  Salome Arnt, PharmD, BCPS []  Johnnette Gourd, PharmD, BCPS []  Hughes Better, PharmD []  Leeroy Cha, PharmD, BCPS []  Laqueta Linden, PharmD  [place] Team []  [address], PharmD []  Leodis Sias, PharmD []  Lindell Spar, PharmD []  Royetta Asal, Rph []  Graylin Shiver) Rema Fendt, PharmD []  Glennon Mac, PharmD [x]  Arlyn Dunning, PharmD []  Netta Cedars, PharmD []  Dia Sitter, PharmD []  Leone Haven, PharmD []  Gretta Arab, PharmD []  Theodis Shove, PharmD []  Peggyann Juba, PharmD   Positive blood culture Treated with Amoxicillin, organism sensitive to the same and no further patient follow-up is required at this time.  Reuel Boom [facility] 10/04/2019, 12:09 PM

## 2019-10-06 LAB — CULTURE, BLOOD (ROUTINE X 2)
Culture: NO GROWTH
Culture: NO GROWTH
Special Requests: ADEQUATE
Special Requests: ADEQUATE

## 2020-04-29 DIAGNOSIS — F902 Attention-deficit hyperactivity disorder, combined type: Secondary | ICD-10-CM | POA: Diagnosis present

## 2020-09-18 DIAGNOSIS — J189 Pneumonia, unspecified organism: Secondary | ICD-10-CM

## 2020-09-18 HISTORY — DX: Pneumonia, unspecified organism: J18.9

## 2020-09-20 NOTE — Progress Notes (Deleted)
[facility] [address] [place] [address] Phone: [phone] Subjective:    I'm seeing this patient by the request  of:  Iona Hansen, NP  CC:   [id number]  Brittany Hobbs is a 60 y.o. female coming in with complaint of ***  Onset-  Location Duration-  Character- Aggravating factors- Reliving factors-  Therapies tried-  Severity-     Past Medical History:  Diagnosis Date  . Allergy   . Arthritis   . Asthma   . Diverticulitis   . Hyperlipidemia   . Lupus (HCC)   . Panic attacks   . Seasonal allergies    Past Surgical History:  Procedure Laterality Date  . PATELLA FRACTURE SURGERY Left   . scar tissue eye  1983   right  . TUBAL LIGATION  1985  . WRIST FRACTURE SURGERY Right    Social History   Socioeconomic History  . Marital status: Single    Spouse name: Not on file  . Number of children: Not on file  . Years of education: Not on file  . Highest education level: Not on file  Occupational History  . Not on file  Tobacco Use  . Smoking status: Current Every Day Smoker    Packs/day: 0.30    Types: Cigarettes  . Smokeless tobacco: Never Used  . Tobacco comment: Trying to quit  Vaping Use  . Vaping Use: Never used  Substance and Sexual Activity  . Alcohol use: No  . Drug use: No  . Sexual activity: Not on file  Other Topics Concern  . Not on file  Social History Narrative  . Not on file   Social Determinants of Health   Financial Resource Strain: Not on file  Food Insecurity: Not on file  Transportation Needs: Not on file  Physical Activity: Not on file  Stress: Not on file  Social Connections: Not on file   Allergies  Allergen Reactions  . Iodine Anaphylaxis    Pt states she is allergic to INTERNAL IODINE  . Betadine [Povidone Iodine] Hives  . Sulfa Antibiotics     N/V   Family History  Problem Relation Age of Onset  . Colon cancer Neg Hx   . Stomach cancer Neg Hx   . Esophageal  cancer Neg Hx   . Rectal cancer Neg Hx       Current Outpatient Medications (Respiratory):  .  diphenhydrAMINE (BENADRYL) 50 MG tablet, Take 50 mg by mouth at bedtime.  .  fluticasone (FLONASE) 50 MCG/ACT nasal spray, Place 1 spray into both nostrils as needed for allergies.   Current Outpatient Medications (Analgesics):  .  aspirin EC 81 MG tablet, Take 81 mg by mouth daily. Marland Kitchen  HYDROcodone-acetaminophen (NORCO/VICODIN) 5-325 MG tablet, Take 1-2 tablets by mouth every 6 (six) hours as needed for severe pain. .  naproxen sodium (ALEVE) 220 MG tablet, Take 220 mg by mouth daily as needed (back pain.).   Current Outpatient Medications (Other):  .  amoxicillin-clavulanate (AUGMENTIN) 875-125 MG tablet, Take 1 tablet by mouth every 12 (twelve) hours. Marland Kitchen  dicyclomine (BENTYL) 20 MG tablet, Take 1 tablet (20 mg total) by mouth 2 (two) times daily. Marland Kitchen  escitalopram (LEXAPRO) 20 MG tablet, Take 20 mg by mouth daily. Marland Kitchen  gabapentin (NEURONTIN) 400 MG capsule, Take 400 mg by mouth 3 (three) times daily.  Marland Kitchen  LORazepam (ATIVAN) 0.5 MG tablet, Take 0.5-1 mg by mouth See admin instructions. 0.5mg  in AM  and 1mg  in evening .  nicotine (NICODERM CQ - DOSED IN MG/24 HOURS) 14 mg/24hr patch, Place 1 patch (14 mg total) onto the skin daily. .  ondansetron (ZOFRAN ODT) 4 MG disintegrating tablet, Take 1 tablet (4 mg total) by mouth every 8 (eight) hours as needed for nausea or vomiting. .  Probiotic Product (SOLUBLE FIBER/PROBIOTICS PO), Take by mouth daily.   Reviewed prior external information including notes and imaging from  primary care provider As well as notes that were available from care everywhere and other healthcare systems.  Past medical history, social, surgical and family history all reviewed in electronic medical record.  No pertanent information unless stated regarding to the chief complaint.   Review of Systems:  No headache, visual changes, nausea, vomiting, diarrhea, constipation,  dizziness, abdominal pain, skin rash, fevers, chills, night sweats, weight loss, swollen lymph nodes, body aches, joint swelling, chest pain, shortness of breath, mood changes. POSITIVE muscle aches  Objective  There were no vitals taken for this visit.   General: No apparent distress alert and oriented x3 mood and affect normal, dressed appropriately.  HEENT: Pupils equal, extraocular movements intact  Respiratory: Patient's speak in full sentences and does not appear short of breath  Cardiovascular: No lower extremity edema, non tender, no erythema  Neuro: Cranial nerves II through XII are intact, neurovascularly intact in all extremities with 2+ DTRs and 2+ pulses.  Gait normal with good balance and coordination.  MSK:  Non tender with full range of motion and good stability and symmetric strength and tone of shoulders, elbows, wrist, hip, knee and ankles bilaterally.     Impression and Recommendations:     The above documentation has been reviewed and is accurate and complete , DO

## 2020-09-21 ENCOUNTER — Ambulatory Visit: Payer: BC Managed Care – PPO | Admitting: Family Medicine

## 2020-10-05 ENCOUNTER — Ambulatory Visit: Payer: BC Managed Care – PPO | Admitting: Family Medicine

## 2020-10-20 ENCOUNTER — Ambulatory Visit: Payer: BC Managed Care – PPO | Admitting: Family Medicine

## 2020-10-20 NOTE — Progress Notes (Deleted)
[facility] [place] Phone: [phone] Subjective:    I'm seeing this patient by the request  of:  Berkley Harvey, NP  CC:   [id number]  Alyah Boehning is a 60 y.o. female coming in with complaint of right hand pain.   Onset-  Location Duration-  Character- Aggravating factors- Reliving factors-  Therapies tried-  Severity-     Past Medical History:  Diagnosis Date  . Allergy   . Arthritis   . Asthma   . Diverticulitis   . Hyperlipidemia   . Lupus ([place])   . Panic attacks   . Seasonal allergies    Past Surgical History:  Procedure Laterality Date  . PATELLA FRACTURE SURGERY Left   . scar tissue eye  1983   right  . TUBAL LIGATION  1985  . WRIST FRACTURE SURGERY Right    Social History   Socioeconomic History  . Marital status: Single    Spouse name: Not on file  . Number of children: Not on file  . Years of education: Not on file  . Highest education level: Not on file  Occupational History  . Not on file  Tobacco Use  . Smoking status: Current Every Day Smoker    Packs/day: 0.30    Types: Cigarettes  . Smokeless tobacco: Never Used  . Tobacco comment: Trying to quit  Vaping Use  . Vaping Use: Never used  Substance and Sexual Activity  . Alcohol use: No  . Drug use: No  . Sexual activity: Not on file  Other Topics Concern  . Not on file  Social History Narrative  . Not on file   Social Determinants of Health   Financial Resource Strain: Not on file  Food Insecurity: Not on file  Transportation Needs: Not on file  Physical Activity: Not on file  Stress: Not on file  Social Connections: Not on file   Allergies  Allergen Reactions  . Iodine Anaphylaxis    Pt states she is allergic to INTERNAL IODINE  . Betadine [Povidone Iodine] Hives  . Sulfa Antibiotics     N/V   Family History  Problem Relation Age of Onset  . Colon cancer Neg Hx   . Stomach cancer Neg Hx    . Esophageal cancer Neg Hx   . Rectal cancer Neg Hx       Current Outpatient Medications (Respiratory):  .  diphenhydrAMINE (BENADRYL) 50 MG tablet, Take 50 mg by mouth at bedtime.  .  fluticasone (FLONASE) 50 MCG/ACT nasal spray, Place 1 spray into both nostrils as needed for allergies.   Current Outpatient Medications (Analgesics):  .  aspirin EC 81 MG tablet, Take 81 mg by mouth daily. Marland Kitchen  HYDROcodone-acetaminophen (NORCO/VICODIN) 5-325 MG tablet, Take 1-2 tablets by mouth every 6 (six) hours as needed for severe pain. .  naproxen sodium (ALEVE) 220 MG tablet, Take 220 mg by mouth daily as needed (back pain.).   Current Outpatient Medications (Other):  .  amoxicillin-clavulanate (AUGMENTIN) 875-125 MG tablet, Take 1 tablet by mouth every 12 (twelve) hours. Marland Kitchen  dicyclomine (BENTYL) 20 MG tablet, Take 1 tablet (20 mg total) by mouth 2 (two) times daily. Marland Kitchen  escitalopram (LEXAPRO) 20 MG tablet, Take 20 mg by mouth daily. Marland Kitchen  gabapentin (NEURONTIN) 400 MG capsule, Take 400 mg by mouth 3 (three) times daily.  Marland Kitchen  LORazepam (ATIVAN) 0.5 MG tablet, Take 0.5-1 mg by mouth See admin instructions.  0.5mg  in AM and 1mg  in evening .  nicotine (NICODERM CQ - DOSED IN MG/24 HOURS) 14 mg/24hr patch, Place 1 patch (14 mg total) onto the skin daily. .  ondansetron (ZOFRAN ODT) 4 MG disintegrating tablet, Take 1 tablet (4 mg total) by mouth every 8 (eight) hours as needed for nausea or vomiting. .  Probiotic Product (SOLUBLE FIBER/PROBIOTICS PO), Take by mouth daily.   Reviewed prior external information including notes and imaging from  primary care provider As well as notes that were available from care everywhere and other healthcare systems.  Past medical history, social, surgical and family history all reviewed in electronic medical record.  No pertanent information unless stated regarding to the chief complaint.   Review of Systems:  No headache, visual changes, nausea, vomiting, diarrhea,  constipation, dizziness, abdominal pain, skin rash, fevers, chills, night sweats, weight loss, swollen lymph nodes, body aches, joint swelling, chest pain, shortness of breath, mood changes. POSITIVE muscle aches  Objective  There were no vitals taken for this visit.   General: No apparent distress alert and oriented x3 mood and affect normal, dressed appropriately.  HEENT: Pupils equal, extraocular movements intact  Respiratory: Patient's speak in full sentences and does not appear short of breath  Cardiovascular: No lower extremity edema, non tender, no erythema  Gait normal with good balance and coordination.  MSK:  Non tender with full range of motion and good stability and symmetric strength and tone of shoulders, elbows, wrist, hip, knee and ankles bilaterally.     Impression and Recommendations:     The above documentation has been reviewed and is accurate and complete Jacqualin Combes

## 2020-10-25 NOTE — Progress Notes (Unsigned)
[facility] [address] [place] Phone: [phone] Subjective:   I [name] am serving as a Education administrator for Dr. Hulan Saas.  This visit occurred during the SARS-CoV-2 public health emergency.  Safety protocols were in place, including screening questions prior to the visit, additional usage of staff PPE, and extensive cleaning of exam room while observing appropriate contact time as indicated for disinfecting solutions.   I'm seeing this patient by the request  of:  Berkley Harvey, NP  CC: Right hand pain  [id number]  Tabitha Riggins is a 60 y.o. female coming in with complaint of right hand pain. Diagnosed with Dupuytren's contracture by another provider. Patient works at [employer]. States she has trigger finger as well as dry fingers. Fingers are swollen. Believes she may have athritis as well. Patient states she is very active. Past medical history is significant for lupus. Patient states that she does have the finger swelling and dry fingers regularly but now the pain in the finger is new.  Onset-  Chronic (4 months)  Location- middle and ring finger on the right hand mostly the ring finger on the right hand being the worst one. Duration- pain never stops  Character- "brings me to my knees" Aggravating factors- ADLs, sometimes doing nothing causing the fingers to lock up Reliving factors-  Therapies tried- Compression gloves, exercises, warm water, gabapentin (does not help the hands) Severity- 9/10 at its worse      Past Medical History:  Diagnosis Date  . Allergy   . Arthritis   . Asthma   . Diverticulitis   . Hyperlipidemia   . Lupus ([place])   . Panic attacks   . Seasonal allergies    Past Surgical History:  Procedure Laterality Date  . PATELLA FRACTURE SURGERY Left   . scar tissue eye  1983   right  . TUBAL LIGATION  1985  . WRIST FRACTURE SURGERY Right    Social History   Socioeconomic History  . Marital  status: Single    Spouse name: Not on file  . Number of children: Not on file  . Years of education: Not on file  . Highest education level: Not on file  Occupational History  . Not on file  Tobacco Use  . Smoking status: Current Every Day Smoker    Packs/day: 0.30    Types: Cigarettes  . Smokeless tobacco: Never Used  . Tobacco comment: Trying to quit  Vaping Use  . Vaping Use: Never used  Substance and Sexual Activity  . Alcohol use: No  . Drug use: No  . Sexual activity: Not on file  Other Topics Concern  . Not on file  Social History Narrative  . Not on file   Social Determinants of Health   Financial Resource Strain: Not on file  Food Insecurity: Not on file  Transportation Needs: Not on file  Physical Activity: Not on file  Stress: Not on file  Social Connections: Not on file   Allergies  Allergen Reactions  . Iodine Anaphylaxis    Pt states she is allergic to INTERNAL IODINE  . Betadine [Povidone Iodine] Hives  . Sulfa Antibiotics     N/V   Family History  Problem Relation Age of Onset  . Colon cancer Neg Hx   . Stomach cancer Neg Hx   . Esophageal cancer Neg Hx   . Rectal cancer Neg Hx       Current Outpatient Medications (  Respiratory):  .  diphenhydrAMINE (BENADRYL) 50 MG tablet, Take 50 mg by mouth at bedtime.  .  fluticasone (FLONASE) 50 MCG/ACT nasal spray, Place 1 spray into both nostrils as needed for allergies.   Current Outpatient Medications (Analgesics):  .  aspirin EC 81 MG tablet, Take 81 mg by mouth daily. Marland Kitchen  HYDROcodone-acetaminophen (NORCO/VICODIN) 5-325 MG tablet, Take 1-2 tablets by mouth every 6 (six) hours as needed for severe pain. .  naproxen sodium (ALEVE) 220 MG tablet, Take 220 mg by mouth daily as needed (back pain.).   Current Outpatient Medications (Other):  .  amoxicillin-clavulanate (AUGMENTIN) 875-125 MG tablet, Take 1 tablet by mouth every 12 (twelve) hours. Marland Kitchen  dicyclomine (BENTYL) 20 MG tablet, Take 1 tablet (20 mg  total) by mouth 2 (two) times daily. Marland Kitchen  escitalopram (LEXAPRO) 20 MG tablet, Take 20 mg by mouth daily. Marland Kitchen  gabapentin (NEURONTIN) 400 MG capsule, Take 400 mg by mouth 3 (three) times daily.  Marland Kitchen  LORazepam (ATIVAN) 0.5 MG tablet, Take 0.5-1 mg by mouth See admin instructions. 0.5mg  in AM and 1mg  in evening .  nicotine (NICODERM CQ - DOSED IN MG/24 HOURS) 14 mg/24hr patch, Place 1 patch (14 mg total) onto the skin daily. .  ondansetron (ZOFRAN ODT) 4 MG disintegrating tablet, Take 1 tablet (4 mg total) by mouth every 8 (eight) hours as needed for nausea or vomiting. .  Probiotic Product (SOLUBLE FIBER/PROBIOTICS PO), Take by mouth daily.   Reviewed prior external information including notes and imaging from  primary care provider As well as notes that were available from care everywhere and other healthcare systems.  Past medical history, social, surgical and family history all reviewed in electronic medical record.  No pertanent information unless stated regarding to the chief complaint.   Review of Systems:  No headache, visual changes, nausea, vomiting, diarrhea, constipation, dizziness, abdominal pain, skin rash, fevers, chills, night sweats, weight loss, swollen lymph nodes joint swelling, chest pain, shortness of breath, mood changes. POSITIVE muscle aches, body aches  Objective  Blood pressure (!) 150/90, pulse 73, height 5\' 6"  (1.676 m), weight 147 lb (66.7 kg), SpO2 100 %.   General: No apparent distress alert and oriented x3 mood and affect normal, dressed appropriately.  HEENT: Pupils equal, extraocular movements intact  Respiratory: Patient's speak in full sentences and does not appear short of breath  Cardiovascular: No lower extremity edema, non tender, no erythema  Gait normal with good balance and coordination.  MSK: Significant arthritic changes of multiple joints. Patient does have a mild synovitis noted of the MCPs of the hands noted. Patient does have a trigger nodule  noted at the A2 pulley with severe tenderness of the ring finger. Patient does have some arthritic changes noted of the DIP and PIP of multiple fingers of the hand. Patient does have erythema noted of the ulnar side of the palm. Neurovascularly intact distally.  Procedure: Real-time Ultrasound Guided Injection of right flexor tendon sheath of the ring finger Device: GE Logiq Q7 Ultrasound guided injection is preferred based studies that show increased duration, increased effect, greater accuracy, decreased procedural pain, increased response rate, and decreased cost with ultrasound guided versus blind injection.  Verbal informed consent obtained.  Time-out conducted.  Noted no overlying erythema, induration, or other signs of local infection.  Skin prepped in a sterile fashion.  Local anesthesia: Topical Ethyl chloride.  With sterile technique and under real time ultrasound guidance: With a 25-gauge half inch needle injected with 0.5 cc  of 0.5% Marcaine and 0.5 cc of Kenalog 40 mg/mL Completed without difficulty  Pain immediately resolved suggesting accurate placement of the medication.  Advised to call if fevers/chills, erythema, induration, drainage, or persistent bleeding.  Impression: Technically successful ultrasound guided injection.    Impression and Recommendations:     The above documentation has been reviewed and is accurate and complete Lyndal Pulley, DO

## 2020-10-26 ENCOUNTER — Other Ambulatory Visit: Payer: Self-pay

## 2020-10-26 ENCOUNTER — Encounter: Payer: Self-pay | Admitting: Family Medicine

## 2020-10-26 ENCOUNTER — Ambulatory Visit (INDEPENDENT_AMBULATORY_CARE_PROVIDER_SITE_OTHER): Payer: BC Managed Care – PPO | Admitting: Family Medicine

## 2020-10-26 ENCOUNTER — Ambulatory Visit (INDEPENDENT_AMBULATORY_CARE_PROVIDER_SITE_OTHER): Payer: BC Managed Care – PPO

## 2020-10-26 ENCOUNTER — Ambulatory Visit: Payer: Self-pay

## 2020-10-26 VITALS — BP 150/90 | HR 73 | Ht 66.0 in | Wt 147.0 lb

## 2020-10-26 DIAGNOSIS — M79641 Pain in right hand: Secondary | ICD-10-CM

## 2020-10-26 DIAGNOSIS — M65341 Trigger finger, right ring finger: Secondary | ICD-10-CM | POA: Diagnosis not present

## 2020-10-26 IMAGING — DX DG WRIST COMPLETE 3+V*R*
4 series · 4 of 4 positions shown · non-contrast
Comparison: None.

CLINICAL DATA: Right wrist pain

EXAM:
RIGHT WRIST - COMPLETE 3+ VIEW

[wrist ap]
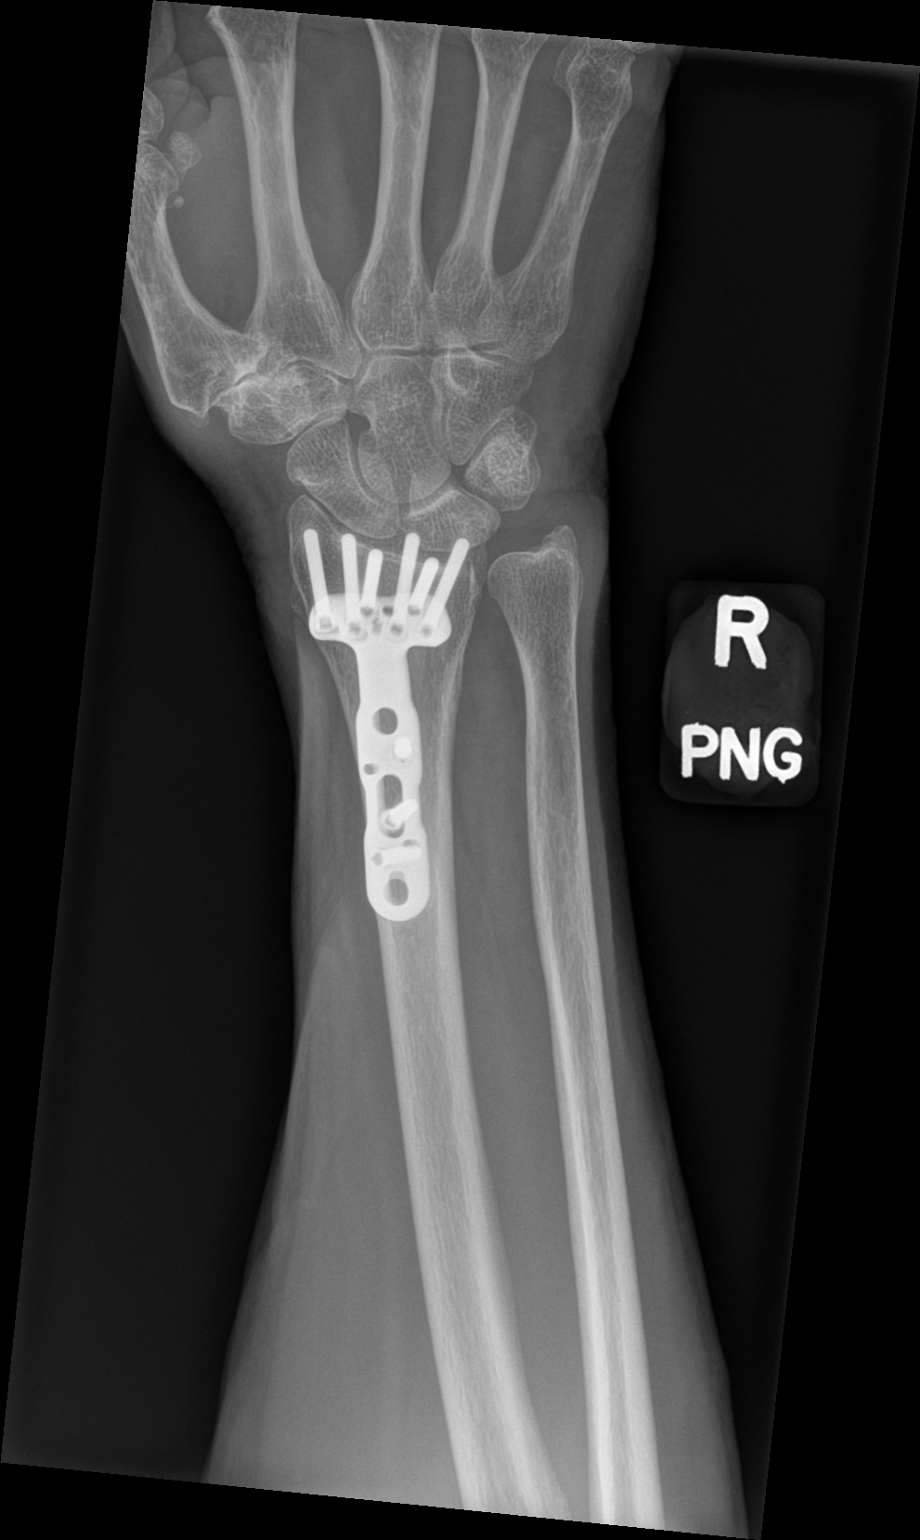

[wrist obl]
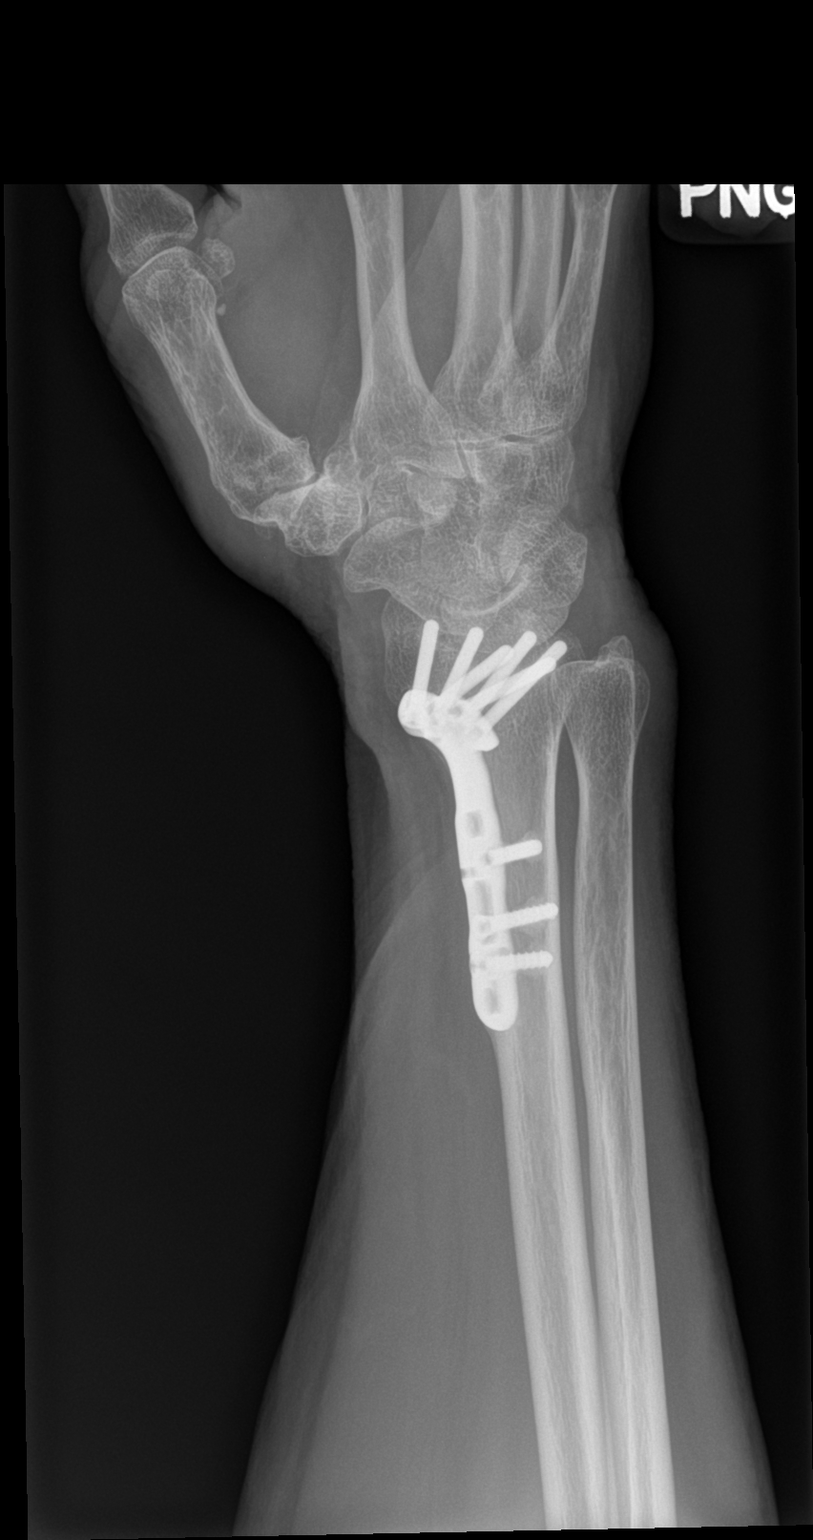

[wrist lat]
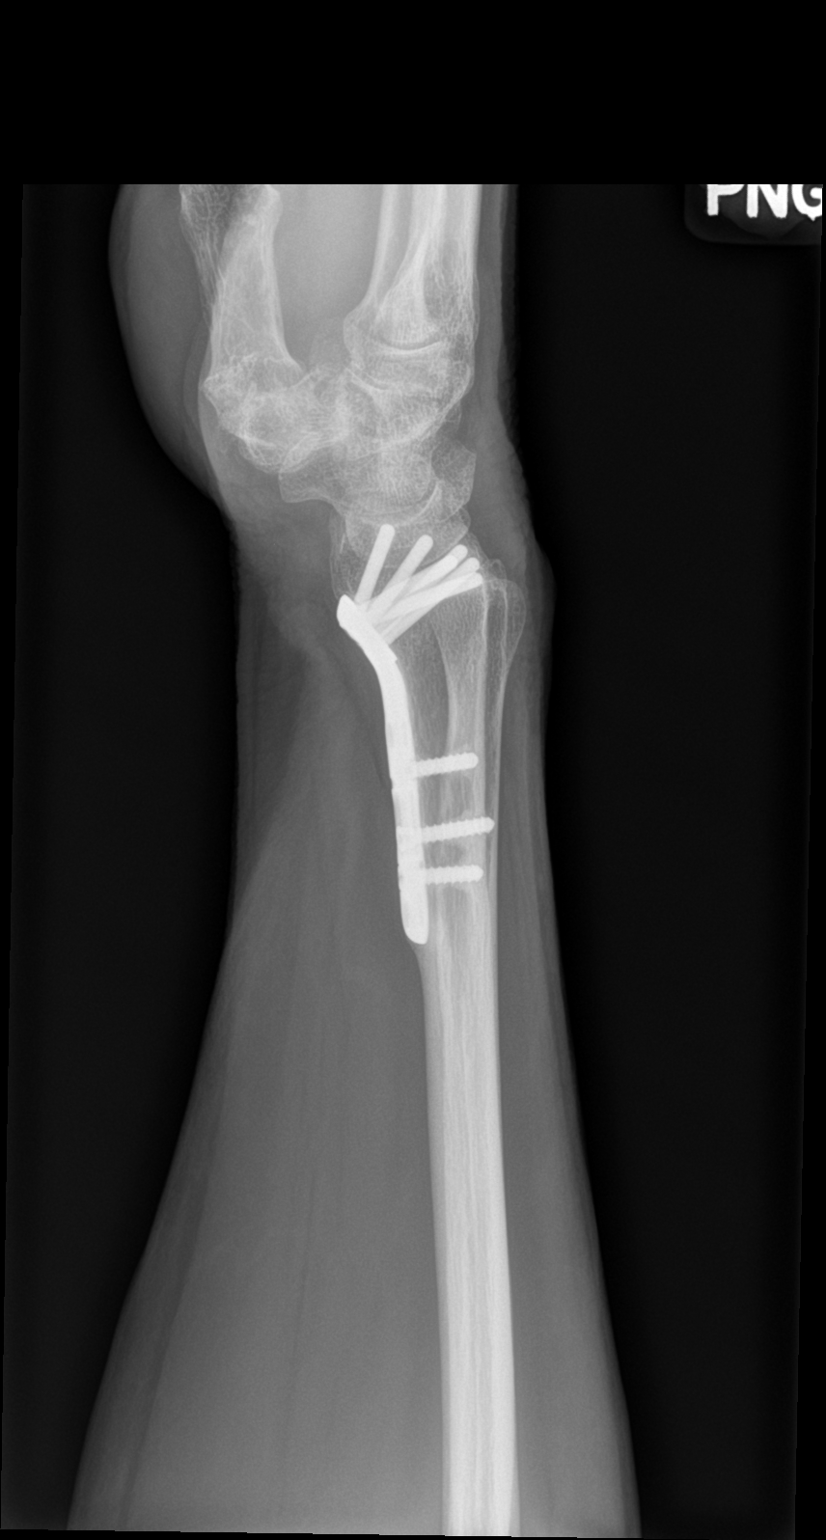

[scaphoid]
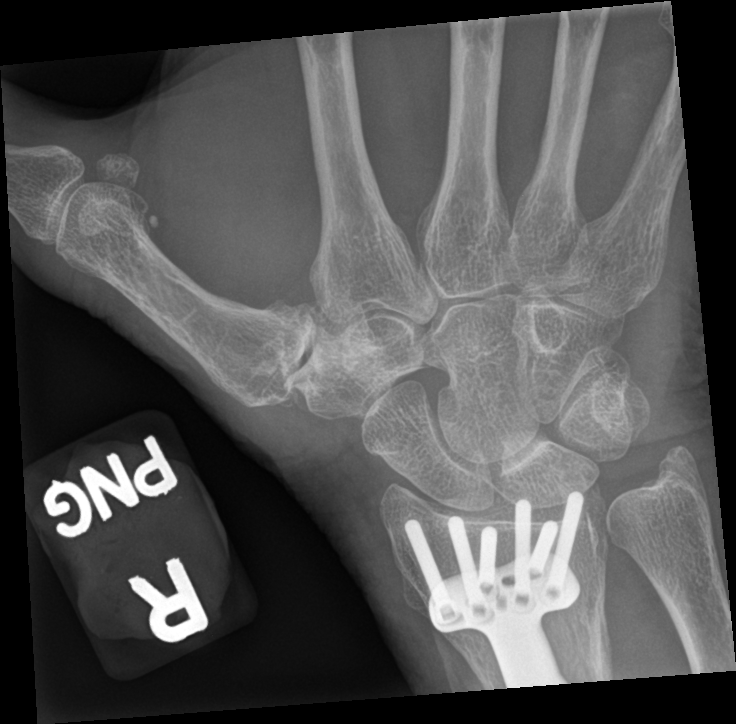

[4 of 4 positions shown; findings below may reference images not displayed]

FINDINGS: No acute fracture or dislocation. No aggressive osseous lesion.
Severe osteoarthritis of the first CMC joint. Distal radial volar
sideplate with multiple interlocking screws transfixing a healed
fracture. Soft tissues are unremarkable.
IMPRESSION: 1. No acute osseous injury of the right wrist.
2. Severe osteoarthritis of the first CMC joint.

## 2020-10-26 NOTE — Assessment & Plan Note (Addendum)
Patient given injection today with her already failing some of the conservative therapy she has been trying at home. I am hoping that this will be beneficial. Given a brace to wear at night. Discussed warm compresses and massage. We will get x-rays of patient's wrist with history of significant surgery. Patient does have lupus and we did discuss monitoring patient's symptoms as well as blood flow. Patient continues to have pain I do think possibly nerve conduction study and ABIs would be beneficial for this extremity. Patient will follow up with me again in 4 to 6 weeks.  With worsening pain patient may need advanced imaging as well or send a surgical intervention secondary to patient's other comorbidities.

## 2020-10-26 NOTE — Patient Instructions (Signed)
Good to see you  Bracing at night Warm compress and massage Injection today Watch blood flow in hands Keep control of lupus See me again in 4-6 weeks

## 2020-11-08 ENCOUNTER — Encounter: Payer: Self-pay | Admitting: Neurology

## 2020-12-06 NOTE — Progress Notes (Deleted)
Brittany Hobbs [place] Phone: [phone] Subjective:    I'm seeing this patient by the request  of:  Berkley Harvey, NP  CC:   [id number]   10/26/2020 Patient given injection today with her already failing some of the conservative therapy she has been trying at home. I am hoping that this will be beneficial. Given a brace to wear at night. Discussed warm compresses and massage. We will get x-rays of patient's wrist with history of significant surgery. Patient does have lupus and we did discuss monitoring patient's symptoms as well as blood flow. Patient continues to have pain I do think possibly nerve conduction study and ABIs would be beneficial for this extremity. Patient will follow up with me again in 4 to 6 weeks.  With worsening pain patient may need advanced imaging as well or send a surgical intervention secondary to patient's other comorbidities.   Update 12/07/2020 Brittany Hobbs is a 60 y.o. female coming in with complaint of right hand, ring finger. Patient states   Onset-  Location Duration-  Character- Aggravating factors- Reliving factors-  Therapies tried-  Severity-     Past Medical History:  Diagnosis Date  . Allergy   . Arthritis   . Asthma   . Diverticulitis   . Hyperlipidemia   . Lupus (Brittany Hobbs)   . Panic attacks   . Seasonal allergies    Past Surgical History:  Procedure Laterality Date  . PATELLA FRACTURE SURGERY Left   . scar tissue eye  1983   right  . TUBAL LIGATION  1985  . WRIST FRACTURE SURGERY Right    Social History   Socioeconomic History  . Marital status: Single    Spouse name: Not on file  . Number of children: Not on file  . Years of education: Not on file  . Highest education level: Not on file  Occupational History  . Not on file  Tobacco Use  . Smoking status: Current Every Day Smoker    Packs/day: 0.30    Types: Cigarettes  . Smokeless tobacco: Never  Used  . Tobacco comment: Trying to quit  Vaping Use  . Vaping Use: Never used  Substance and Sexual Activity  . Alcohol use: No  . Drug use: No  . Sexual activity: Not on file  Other Topics Concern  . Not on file  Social History Narrative  . Not on file   Social Determinants of Health   Financial Resource Strain: Not on file  Food Insecurity: Not on file  Transportation Needs: Not on file  Physical Activity: Not on file  Stress: Not on file  Social Connections: Not on file   Allergies  Allergen Reactions  . Iodine Anaphylaxis    Pt states she is allergic to INTERNAL IODINE  . Betadine [Povidone Iodine] Hives  . Sulfa Antibiotics     N/V   Family History  Problem Relation Age of Onset  . Colon cancer Neg Hx   . Stomach cancer Neg Hx   . Esophageal cancer Neg Hx   . Rectal cancer Neg Hx       Current Outpatient Medications (Respiratory):  .  diphenhydrAMINE (BENADRYL) 50 MG tablet, Take 50 mg by mouth at bedtime.  .  fluticasone (FLONASE) 50 MCG/ACT nasal spray, Place 1 spray into both nostrils as needed for allergies.   Current Outpatient Medications (Analgesics):  .  aspirin EC 81 MG tablet, Take 81 mg  by mouth daily. Marland Kitchen  HYDROcodone-acetaminophen (NORCO/VICODIN) 5-325 MG tablet, Take 1-2 tablets by mouth every 6 (six) hours as needed for severe pain. .  naproxen sodium (ALEVE) 220 MG tablet, Take 220 mg by mouth daily as needed (back pain.).   Current Outpatient Medications (Other):  .  amoxicillin-clavulanate (AUGMENTIN) 875-125 MG tablet, Take 1 tablet by mouth every 12 (twelve) hours. Marland Kitchen  dicyclomine (BENTYL) 20 MG tablet, Take 1 tablet (20 mg total) by mouth 2 (two) times daily. Marland Kitchen  escitalopram (LEXAPRO) 20 MG tablet, Take 20 mg by mouth daily. Marland Kitchen  gabapentin (NEURONTIN) 400 MG capsule, Take 400 mg by mouth 3 (three) times daily.  Marland Kitchen  LORazepam (ATIVAN) 0.5 MG tablet, Take 0.5-1 mg by mouth See admin instructions. 0.5mg  in AM and 1mg  in evening .  nicotine  (NICODERM CQ - DOSED IN MG/24 HOURS) 14 mg/24hr patch, Place 1 patch (14 mg total) onto the skin daily. .  ondansetron (ZOFRAN ODT) 4 MG disintegrating tablet, Take 1 tablet (4 mg total) by mouth every 8 (eight) hours as needed for nausea or vomiting. .  Probiotic Product (SOLUBLE FIBER/PROBIOTICS PO), Take by mouth daily.   Reviewed prior external information including notes and imaging from  primary care provider As well as notes that were available from care everywhere and other healthcare systems.  Past medical history, social, surgical and family history all reviewed in electronic medical record.  No pertanent information unless stated regarding to the chief complaint.   Review of Systems:  No headache, visual changes, nausea, vomiting, diarrhea, constipation, dizziness, abdominal pain, skin rash, fevers, chills, night sweats, weight loss, swollen lymph nodes, body aches, joint swelling, chest pain, shortness of breath, mood changes. POSITIVE muscle aches  Objective  There were no vitals taken for this visit.   General: No apparent distress alert and oriented x3 mood and affect normal, dressed appropriately.  HEENT: Pupils equal, extraocular movements intact  Respiratory: Patient's speak in full sentences and does not appear short of breath  Cardiovascular: No lower extremity edema, non tender, no erythema  Gait normal with good balance and coordination.  MSK:  Non tender with full range of motion and good stability and symmetric strength and tone of shoulders, elbows, wrist, hip, knee and ankles bilaterally.     Impression and Recommendations:     The above documentation has been reviewed and is accurate and complete Brittany Hobbs

## 2020-12-07 ENCOUNTER — Ambulatory Visit: Payer: BC Managed Care – PPO | Admitting: Family Medicine

## 2020-12-13 NOTE — Progress Notes (Deleted)
NEUROLOGY CONSULTATION NOTE  Brittany Hobbs MRN: [id number] DOB: 03/21/1961  Referring provider: Eldridge Abrahams, NP Primary care provider: Eldridge Abrahams, NP  Reason for consult:  concussion  Assessment/Plan:   ***   Subjective:  Brittany Hobbs is a 60 year old ***-handed female with lupus, generalized anxiety disorder, ADHD, and arthritis who presents for concussion.  History supplemented by referring provider's notes.  In late January, she got up from bed in the middle of the night and ***.  She sustained bruising on the right side of her head and around her right eye.  She felt nauseous afterwards but overall felt okay.  The following evening, she was driving home from her boyfriend's house, only 3 miles away, and [place].  She had to pull over and call a friend to come and get her.  She followed up with her PCP the following day and was advised to be evaluated at the ED.  However, after 5 hours of waiting, she left before being seen.  Dizzy .  She had a CT of the head performed on 10/26/2020 which was unremarkable.     PAST MEDICAL HISTORY: Past Medical History:  Diagnosis Date  . Allergy   . Arthritis   . Asthma   . Diverticulitis   . Hyperlipidemia   . Lupus ([place])   . Panic attacks   . Seasonal allergies     PAST SURGICAL HISTORY: Past Surgical History:  Procedure Laterality Date  . PATELLA FRACTURE SURGERY Left   . scar tissue eye  1983   right  . TUBAL LIGATION  1985  . WRIST FRACTURE SURGERY Right     MEDICATIONS: Current Outpatient Medications on File Prior to Visit  Medication Sig Dispense Refill  . amoxicillin-clavulanate (AUGMENTIN) 875-125 MG tablet Take 1 tablet by mouth every 12 (twelve) hours. 14 tablet 0  . aspirin EC 81 MG tablet Take 81 mg by mouth daily.    Marland Kitchen dicyclomine (BENTYL) 20 MG tablet Take 1 tablet (20 mg total) by mouth 2 (two) times daily. 20 tablet 0  . diphenhydrAMINE (BENADRYL) 50 MG tablet  Take 50 mg by mouth at bedtime.     Marland Kitchen escitalopram (LEXAPRO) 20 MG tablet Take 20 mg by mouth daily.    . fluticasone (FLONASE) 50 MCG/ACT nasal spray Place 1 spray into both nostrils as needed for allergies.     Marland Kitchen gabapentin (NEURONTIN) 400 MG capsule Take 400 mg by mouth 3 (three) times daily.     Marland Kitchen HYDROcodone-acetaminophen (NORCO/VICODIN) 5-325 MG tablet Take 1-2 tablets by mouth every 6 (six) hours as needed for severe pain. 10 tablet 0  . LORazepam (ATIVAN) 0.5 MG tablet Take 0.5-1 mg by mouth See admin instructions. 0.5mg  in AM and 1mg  in evening    . naproxen sodium (ALEVE) 220 MG tablet Take 220 mg by mouth daily as needed (back pain.).    Marland Kitchen nicotine (NICODERM CQ - DOSED IN MG/24 HOURS) 14 mg/24hr patch Place 1 patch (14 mg total) onto the skin daily. 28 patch 0  . ondansetron (ZOFRAN ODT) 4 MG disintegrating tablet Take 1 tablet (4 mg total) by mouth every 8 (eight) hours as needed for nausea or vomiting. 20 tablet 0  . Probiotic Product (SOLUBLE FIBER/PROBIOTICS PO) Take by mouth daily.     No current facility-administered medications on file prior to visit.    ALLERGIES: Allergies  Allergen Reactions  . Iodine Anaphylaxis    Pt  states she is allergic to INTERNAL IODINE  . Betadine [Povidone Iodine] Hives  . Sulfa Antibiotics     N/V    FAMILY HISTORY: Family History  Problem Relation Age of Onset  . Colon cancer Neg Hx   . Stomach cancer Neg Hx   . Esophageal cancer Neg Hx   . Rectal cancer Neg Hx     Objective:  *** General: No acute distress.  Patient appears well-groomed.   Head:  Normocephalic/atraumatic Eyes:  fundi examined but not visualized Neck: supple, no paraspinal tenderness, full range of motion Back: No paraspinal tenderness Heart: regular rate and rhythm Lungs: Clear to auscultation bilaterally. Vascular: No carotid bruits. Neurological Exam: Mental status: alert and oriented to person, place, and time, recent and remote memory intact, fund of  knowledge intact, attention and concentration intact, speech fluent and not dysarthric, language intact. Cranial nerves: CN I: not tested CN II: pupils equal, round and reactive to light, visual fields intact CN III, IV, VI:  full range of motion, no nystagmus, no ptosis CN V: facial sensation intact. CN VII: upper and lower face symmetric CN VIII: hearing intact CN IX, X: gag intact, uvula midline CN XI: sternocleidomastoid and trapezius muscles intact CN XII: tongue midline Bulk & Tone: normal, no fasciculations. Motor:  muscle strength 5/5 throughout Sensation:  Pinprick, temperature and vibratory sensation intact. Deep Tendon Reflexes:  2+ throughout,  toes downgoing.   Finger to nose testing:  Without dysmetria.   Heel to shin:  Without dysmetria.   Gait:  Normal station and stride.  Romberg negative.    Thank you for allowing me to take part in the care of this patient.  Metta Clines, DO  CC:  Eldridge Abrahams, NP

## 2020-12-14 ENCOUNTER — Ambulatory Visit: Payer: BC Managed Care – PPO | Admitting: Neurology

## 2020-12-20 NOTE — Progress Notes (Signed)
[place] [address] [place] Phone: [phone] Subjective:   I [name] am serving as a Education administrator for Dr. Hulan Saas.  This visit occurred during the SARS-CoV-2 public health emergency.  Safety protocols were in place, including screening questions prior to the visit, additional usage of staff PPE, and extensive cleaning of exam room while observing appropriate contact time as indicated for disinfecting solutions.   I'm seeing this patient by the request  of:  Berkley Harvey, NP  CC: Hand pain follow-up  [id number]   10/26/2020 Patient given injection today with her already failing some of the conservative therapy she has been trying at home. I am hoping that this will be beneficial. Given a brace to wear at night. Discussed warm compresses and massage. We will get x-rays of patient's wrist with history of significant surgery. Patient does have lupus and we did discuss monitoring patient's symptoms as well as blood flow. Patient continues to have pain I do think possibly nerve conduction study and ABIs would be beneficial for this extremity. Patient will follow up with me again in 4 to 6 weeks.  With worsening pain patient may need advanced imaging as well or send a surgical intervention secondary to patient's other comorbidities.  Update 12/21/2020 Zannie Cove is a 60 y.o. female coming in with complaint of right hand, middle finger trigger finger pain. Patient states for the last week she has had issues. Swelling and pain. States the wrist is on fire. Would like another injection today.       Past Medical History:  Diagnosis Date  . Allergy   . Arthritis   . Asthma   . Diverticulitis   . Hyperlipidemia   . Lupus ([place])   . Panic attacks   . Seasonal allergies    Past Surgical History:  Procedure Laterality Date  . PATELLA FRACTURE SURGERY Left   . scar tissue eye  1983   right  . TUBAL LIGATION  1985  .  WRIST FRACTURE SURGERY Right    Social History   Socioeconomic History  . Marital status: Single    Spouse name: Not on file  . Number of children: Not on file  . Years of education: Not on file  . Highest education level: Not on file  Occupational History  . Not on file  Tobacco Use  . Smoking status: Current Every Day Smoker    Packs/day: 0.30    Types: Cigarettes  . Smokeless tobacco: Never Used  . Tobacco comment: Trying to quit  Vaping Use  . Vaping Use: Never used  Substance and Sexual Activity  . Alcohol use: No  . Drug use: No  . Sexual activity: Not on file  Other Topics Concern  . Not on file  Social History Narrative  . Not on file   Social Determinants of Health   Financial Resource Strain: Not on file  Food Insecurity: Not on file  Transportation Needs: Not on file  Physical Activity: Not on file  Stress: Not on file  Social Connections: Not on file   Allergies  Allergen Reactions  . Iodine Anaphylaxis    Pt states she is allergic to INTERNAL IODINE  . Betadine [Povidone Iodine] Hives  . Sulfa Antibiotics     N/V   Family History  Problem Relation Age of Onset  . Colon cancer Neg Hx   . Stomach cancer Neg Hx   . Esophageal cancer Neg Hx   .  Rectal cancer Neg Hx       Current Outpatient Medications (Respiratory):  .  diphenhydrAMINE (BENADRYL) 50 MG tablet, Take 50 mg by mouth at bedtime.  .  fluticasone (FLONASE) 50 MCG/ACT nasal spray, Place 1 spray into both nostrils as needed for allergies.   Current Outpatient Medications (Analgesics):  .  aspirin EC 81 MG tablet, Take 81 mg by mouth daily. Marland Kitchen  HYDROcodone-acetaminophen (NORCO/VICODIN) 5-325 MG tablet, Take 1-2 tablets by mouth every 6 (six) hours as needed for severe pain. .  naproxen sodium (ALEVE) 220 MG tablet, Take 220 mg by mouth daily as needed (back pain.).   Current Outpatient Medications (Other):  .  amoxicillin-clavulanate (AUGMENTIN) 875-125 MG tablet, Take 1 tablet by  mouth every 12 (twelve) hours. Marland Kitchen  dicyclomine (BENTYL) 20 MG tablet, Take 1 tablet (20 mg total) by mouth 2 (two) times daily. Marland Kitchen  escitalopram (LEXAPRO) 20 MG tablet, Take 20 mg by mouth daily. Marland Kitchen  gabapentin (NEURONTIN) 400 MG capsule, Take 400 mg by mouth 3 (three) times daily.  Marland Kitchen  LORazepam (ATIVAN) 0.5 MG tablet, Take 0.5-1 mg by mouth See admin instructions. 0.5mg  in AM and 1mg  in evening .  nicotine (NICODERM CQ - DOSED IN MG/24 HOURS) 14 mg/24hr patch, Place 1 patch (14 mg total) onto the skin daily. .  ondansetron (ZOFRAN ODT) 4 MG disintegrating tablet, Take 1 tablet (4 mg total) by mouth every 8 (eight) hours as needed for nausea or vomiting. .  Probiotic Product (SOLUBLE FIBER/PROBIOTICS PO), Take by mouth daily.   Reviewed prior external information including notes and imaging from  primary care provider As well as notes that were available from care everywhere and other healthcare systems.  Past medical history, social, surgical and family history all reviewed in electronic medical record.  No pertanent information unless stated regarding to the chief complaint.   Review of Systems:  No headache, visual changes, nausea, vomiting, diarrhea, constipation, dizziness, abdominal pain, skin rash, fevers, chills, night sweats, weight loss, swollen lymph nodes,  chest pain, shortness of breath, mood changes. POSITIVE muscle aches, body aches and joint swelling  Objective  Blood pressure (!) 160/90, pulse 81, height 5\' 6"  (1.676 m), weight 153 lb (69.4 kg), SpO2 98 %.   General: No apparent distress alert and oriented x3 mood and affect normal, dressed appropriately.  Patient does have a lot of energy HEENT: Pupils equal, extraocular movements intact  Respiratory: Patient's speak in full sentences and does not appear short of breath  Cardiovascular: No lower extremity edema, non tender, no erythema  Gait normal with good balance and coordination.  MSK:  Hand exam bilaterally show the  patient does have significant synovitis bilaterally right greater than left at the moment.  Redness noted to the palms of the hands as well at the moment.  Does have some warmness to touch.  Patient does have diffuse pain noted.  Postsurgical changes of the right wrist noted.  Good grip strength.  Middle finger does have the trigger nodule noted but no significant increase in discomfort.   Impression and Recommendations:    The above documentation has been reviewed and is accurate and complete Lyndal Pulley, DO

## 2020-12-21 ENCOUNTER — Encounter: Payer: Self-pay | Admitting: Family Medicine

## 2020-12-21 ENCOUNTER — Other Ambulatory Visit: Payer: Self-pay

## 2020-12-21 ENCOUNTER — Ambulatory Visit: Payer: Self-pay

## 2020-12-21 ENCOUNTER — Ambulatory Visit (INDEPENDENT_AMBULATORY_CARE_PROVIDER_SITE_OTHER): Payer: BC Managed Care – PPO | Admitting: Family Medicine

## 2020-12-21 VITALS — BP 160/90 | HR 81 | Ht 66.0 in | Wt 153.0 lb

## 2020-12-21 DIAGNOSIS — G8929 Other chronic pain: Secondary | ICD-10-CM

## 2020-12-21 DIAGNOSIS — M65341 Trigger finger, right ring finger: Secondary | ICD-10-CM

## 2020-12-21 DIAGNOSIS — M329 Systemic lupus erythematosus, unspecified: Secondary | ICD-10-CM

## 2020-12-21 DIAGNOSIS — M79644 Pain in right finger(s): Secondary | ICD-10-CM | POA: Diagnosis not present

## 2020-12-21 NOTE — Patient Instructions (Signed)
Good to see you Tumeric 500 mg 2 times a day Watch for reflux and bruising or discontinue Believe hand pain is lupus flare See me again in 6 weeks if needed another injection

## 2020-12-21 NOTE — Assessment & Plan Note (Addendum)
Patient actually is not having any significant triggering at this moment.  Patient's hands though do appear that patient may be having more of a synovitis and seems to be possibly a lupus flare.  Patient has had this since she was the age of 42 and will follow up with her primary care provider.  Home exercises.  Follow-up with me again in 6 weeks to further evaluate.  Did discuss with patient about the possibility of turmeric.  Discussed side effects.  Discussed not taking it with anti-inflammatories.  We will see how patient responds.

## 2021-02-02 NOTE — Progress Notes (Signed)
[place] [address] [place] Phone: [phone] Subjective:   I Brittany Hobbs am serving as a Education administrator for Dr. Hulan Saas.  This visit occurred during the SARS-CoV-2 public health emergency.  Safety protocols were in place, including screening questions prior to the visit, additional usage of staff PPE, and extensive cleaning of exam room while observing appropriate contact time as indicated for disinfecting solutions.   I'm seeing this patient by the request  of:  Berkley Harvey, NP  CC: Right hand pain follow-up, right leg swelling  HWE:[id number]   12/21/2020 Patient actually is not having any significant triggering at this moment.  Patient's hands though do appear that patient may be having more of a synovitis and seems to be possibly a lupus flare.  Patient has had this since she was the age of 89 and will follow up with her primary care provider.  Home exercises.  Follow-up with me again in 6 weeks to further evaluate.  Did discuss with patient about the possibility of turmeric.  Discussed side effects.  Discussed not taking it with anti-inflammatories.  We will see how patient responds.  Update 02/03/2021 Brittany Hobbs is a 60 y.o. female coming in with complaint of right thumb and right lower leg swelling. Patient states she has had leg swelling for 3 weeks. Lower leg and foot is swollen. Pain starts at the knee and it radiates down to the foot. Painful to weight bare. Has been trying to stay off of it. Bottom of her foot has split due to swelling. States she has been more swollen than she is today. Pain is getting worse. Has been taking pain medication.  Was able to review some of the outside information.  Patient has seen other providers for it including her primary care provider.  Has been treated for cellulitis and on amoxicillin and now is on clindamycin.  Patient has been on prednisone with very minimal improvement.  Patient  does states sometimes it does seem to get better in the mornings though of the foot and then gets significantly worse again.  Patient states that there is significant amount of pain.  Patient has had imaging including Dopplers.  Patient states that she is seeing her rheumatologist who did do a significant number of labs as well as an injection in the knee no more than 1 week ago.  Patient does not remember if she was given a steroid day.  States that did not make any significant improvement.  Venous Doppler IMPRESSION:  No evidence of deep venous thrombosis.  Arterial Doppler IMPRESSION:  Unremarkable bilateral duplex arterial evaluation. No evidence of  hemodynamically significant stenosis or occlusion.    Patient did have more of a trigger nodule noted and given injection in February for a right ring finger. Past medical history significant for lupus.  Reviewed patient's outside notes from primary care provider.  Initially was on Augmentin for and then expanded coverage for clindamycin.  This was done on Jan 31, 2021.  Patient has been given pain medications by primary care provider as well.    Past Medical History:  Diagnosis Date  . Allergy   . Arthritis   . Asthma   . Diverticulitis   . Hyperlipidemia   . Lupus ([place])   . Panic attacks   . Seasonal allergies    Past Surgical History:  Procedure Laterality Date  . PATELLA FRACTURE SURGERY Left   . scar tissue eye  1983   right  . TUBAL LIGATION  1985  . WRIST FRACTURE SURGERY Right    Social History   Socioeconomic History  . Marital status: Single    Spouse name: Not on file  . Number of children: Not on file  . Years of education: Not on file  . Highest education level: Not on file  Occupational History  . Not on file  Tobacco Use  . Smoking status: Current Every Day Smoker    Packs/day: 0.30    Types: Cigarettes  . Smokeless tobacco: Never Used  . Tobacco comment: Trying to quit  Vaping Use  . Vaping Use:  Never used  Substance and Sexual Activity  . Alcohol use: No  . Drug use: No  . Sexual activity: Not on file  Other Topics Concern  . Not on file  Social History Narrative  . Not on file   Social Determinants of Health   Financial Resource Strain: Not on file  Food Insecurity: Not on file  Transportation Needs: Not on file  Physical Activity: Not on file  Stress: Not on file  Social Connections: Not on file   Allergies  Allergen Reactions  . Iodine Anaphylaxis    Pt states she is allergic to INTERNAL IODINE  . Betadine [Povidone Iodine] Hives  . Sulfa Antibiotics     N/V   Family History  Problem Relation Age of Onset  . Colon cancer Neg Hx   . Stomach cancer Neg Hx   . Esophageal cancer Neg Hx   . Rectal cancer Neg Hx        Current Outpatient Medications (Analgesics):  .  aspirin EC 81 MG tablet, Take 81 mg by mouth daily.   Current Outpatient Medications (Other):  .  escitalopram (LEXAPRO) 20 MG tablet, Take 20 mg by mouth daily. Marland Kitchen  gabapentin (NEURONTIN) 400 MG capsule, Take 400 mg by mouth 3 (three) times daily.  Marland Kitchen  LORazepam (ATIVAN) 0.5 MG tablet, Take 0.5-1 mg by mouth See admin instructions. 0.5mg  in AM and 1mg  in evening     Past medical history, social, surgical and family history all reviewed in electronic medical record.  No pertanent information unless stated regarding to the chief complaint.   Review of Systems:  No headache, visual changes, nausea, vomiting, diarrhea, constipation, dizziness, abdominal pain, fevers, chills, night sweats, weight loss, swollen lymph nodes, chest pain, shortness of breath, mood changes. POSITIVE muscle aches, body aches, joint swelling, skin rash  Objective  Blood pressure 140/80, pulse 92, height 5\' 6"  (1.676 m), weight 157 lb (71.2 kg), SpO2 94 %.   General: No apparent distress alert and oriented x3 patient noted does have tangential thought process.  Very talkative. HEENT: Pupils equal, extraocular movements  intact  Respiratory: Patient's speak in full sentences and does not appear short of breath  Cardiovascular patient does have swelling of the right lower extremity compared to the left.  Patient does have pitting edema noted on the right side and not the left all the way to the ankle.  Patient also has a significant effusion of the knee noted.  Patient also does have the mild redness but no warmth to the skin noted.  Pain seems to be more around the knee.  Patient does not have as much pain of the ankle but does have significant pitting edema at that level. Gait antalgic favoring the right leg.  Limited musculoskeletal ultrasound was performed and interpreted by Lyndal Pulley  Limited ultrasound of  patient's right knee shows the patient does have what appears to be is fairly large synovitis noted.  Patient also has hypoechoic changes.  Unable to save pictures though secondary to computer malfunction   Impression and Recommendations:     The above documentation has been reviewed and is accurate and complete Lyndal Pulley, DO

## 2021-02-03 ENCOUNTER — Other Ambulatory Visit: Payer: Self-pay

## 2021-02-03 ENCOUNTER — Ambulatory Visit (INDEPENDENT_AMBULATORY_CARE_PROVIDER_SITE_OTHER): Payer: BC Managed Care – PPO

## 2021-02-03 ENCOUNTER — Ambulatory Visit (INDEPENDENT_AMBULATORY_CARE_PROVIDER_SITE_OTHER): Payer: BC Managed Care – PPO | Admitting: Family Medicine

## 2021-02-03 ENCOUNTER — Encounter: Payer: Self-pay | Admitting: Family Medicine

## 2021-02-03 VITALS — BP 140/80 | HR 92 | Ht 66.0 in | Wt 157.0 lb

## 2021-02-03 DIAGNOSIS — M25571 Pain in right ankle and joints of right foot: Secondary | ICD-10-CM

## 2021-02-03 DIAGNOSIS — M25561 Pain in right knee: Secondary | ICD-10-CM

## 2021-02-03 DIAGNOSIS — M25461 Effusion, right knee: Secondary | ICD-10-CM

## 2021-02-03 DIAGNOSIS — G8929 Other chronic pain: Secondary | ICD-10-CM | POA: Diagnosis not present

## 2021-02-03 IMAGING — DX DG ANKLE COMPLETE 3+V*R*
3 series · 3 of 3 positions shown · non-contrast
Comparison: None.
COMPARISON: None.

Addendum:
CLINICAL DATA: RIGHT ankle pain.  Swelling for 3 weeks.

EXAM:
RIGHT ANKLE - COMPLETE 3+ VIEW

[ankle ap]
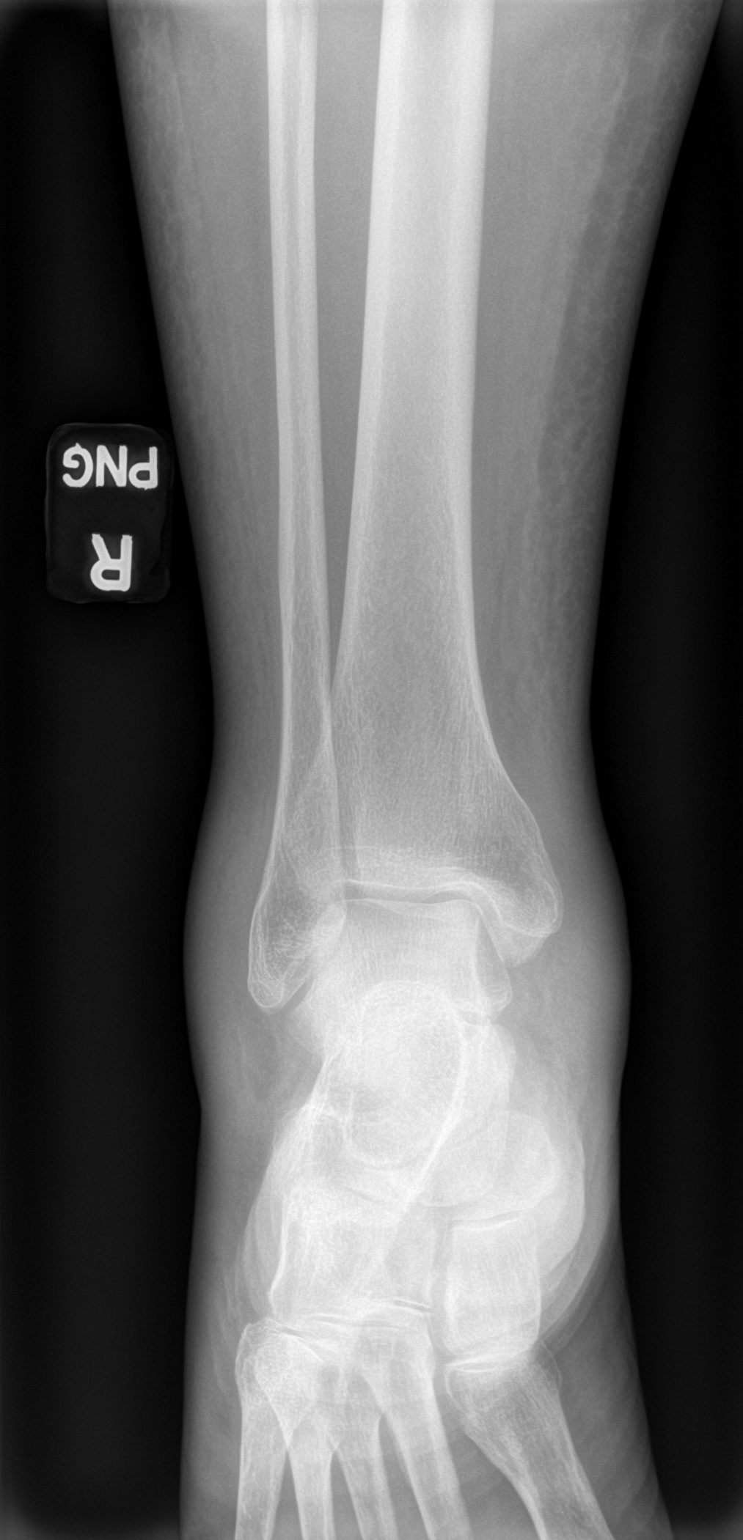

[ankle obl]
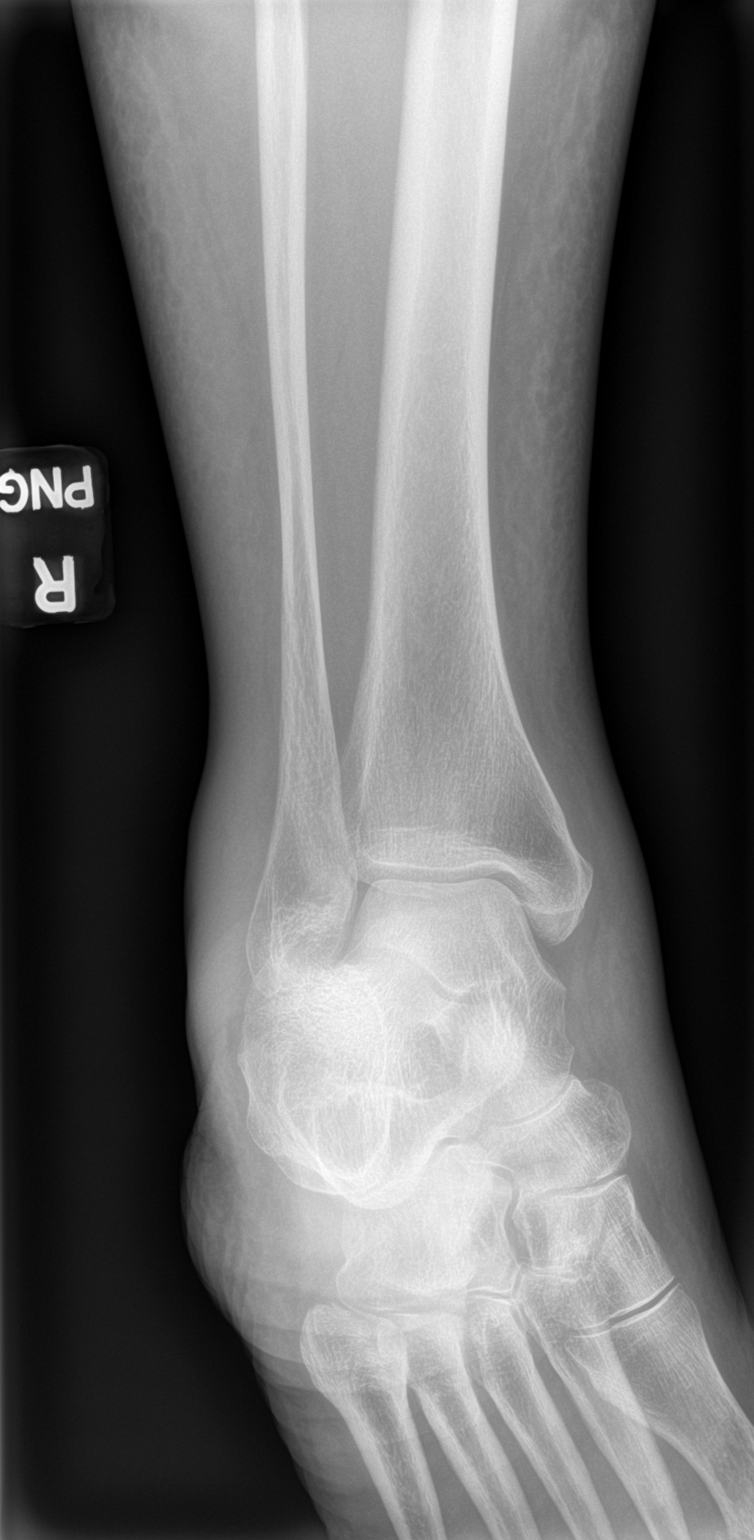

[ankle lat]
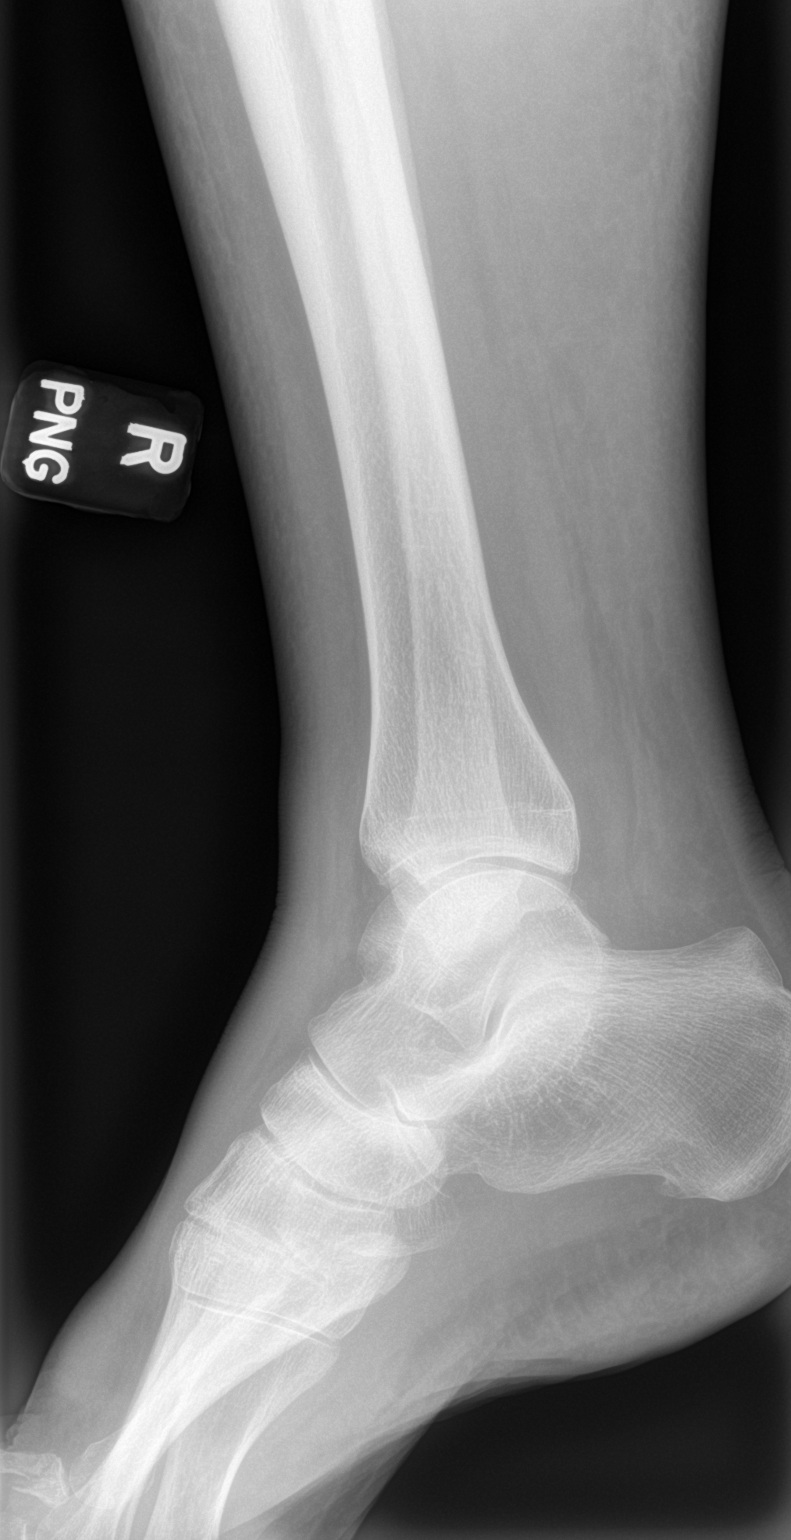

[3 of 3 positions shown; findings below may reference images not displayed]

FINDINGS: There is significant soft tissue swelling of the ankle and foot. On
the LATERAL view, there is possible fracture of the proximal phalanx
of the 5th digit warranting further evaluation. The ankle is intact.
IMPRESSION: 1. Significant soft tissue swelling.
2. Possible fracture of the 5th proximal phalanx. Recommend
dedicated views of the fluid.
3. These results will be called to the ordering clinician or
representative by the Radiologist Assistant, and communication
documented in the PACS or [REDACTED].

ADDENDUM:
Corrected report:

Impression

1.
2. Possible fracture of the 5th proximal phalanx. Recommend
dedicated views the foot.

*** End of Addendum ***
FINDINGS: There is significant soft tissue swelling of the ankle and foot. On
the LATERAL view, there is possible fracture of the proximal phalanx
of the 5th digit warranting further evaluation. The ankle is intact.
IMPRESSION: 1. Significant soft tissue swelling.
2. Possible fracture of the 5th proximal phalanx. Recommend
dedicated views of the fluid.
3. These results will be called to the ordering clinician or
representative by the Radiologist Assistant, and communication
documented in the PACS or [REDACTED].

## 2021-02-03 IMAGING — DX DG KNEE COMPLETE 4+V*R*
4 series · 4 of 4 positions shown · non-contrast
Comparison: None.

CLINICAL DATA: LEFT swelling for 3 weeks.

EXAM:
RIGHT KNEE - COMPLETE 4+ VIEW

[knee ap]
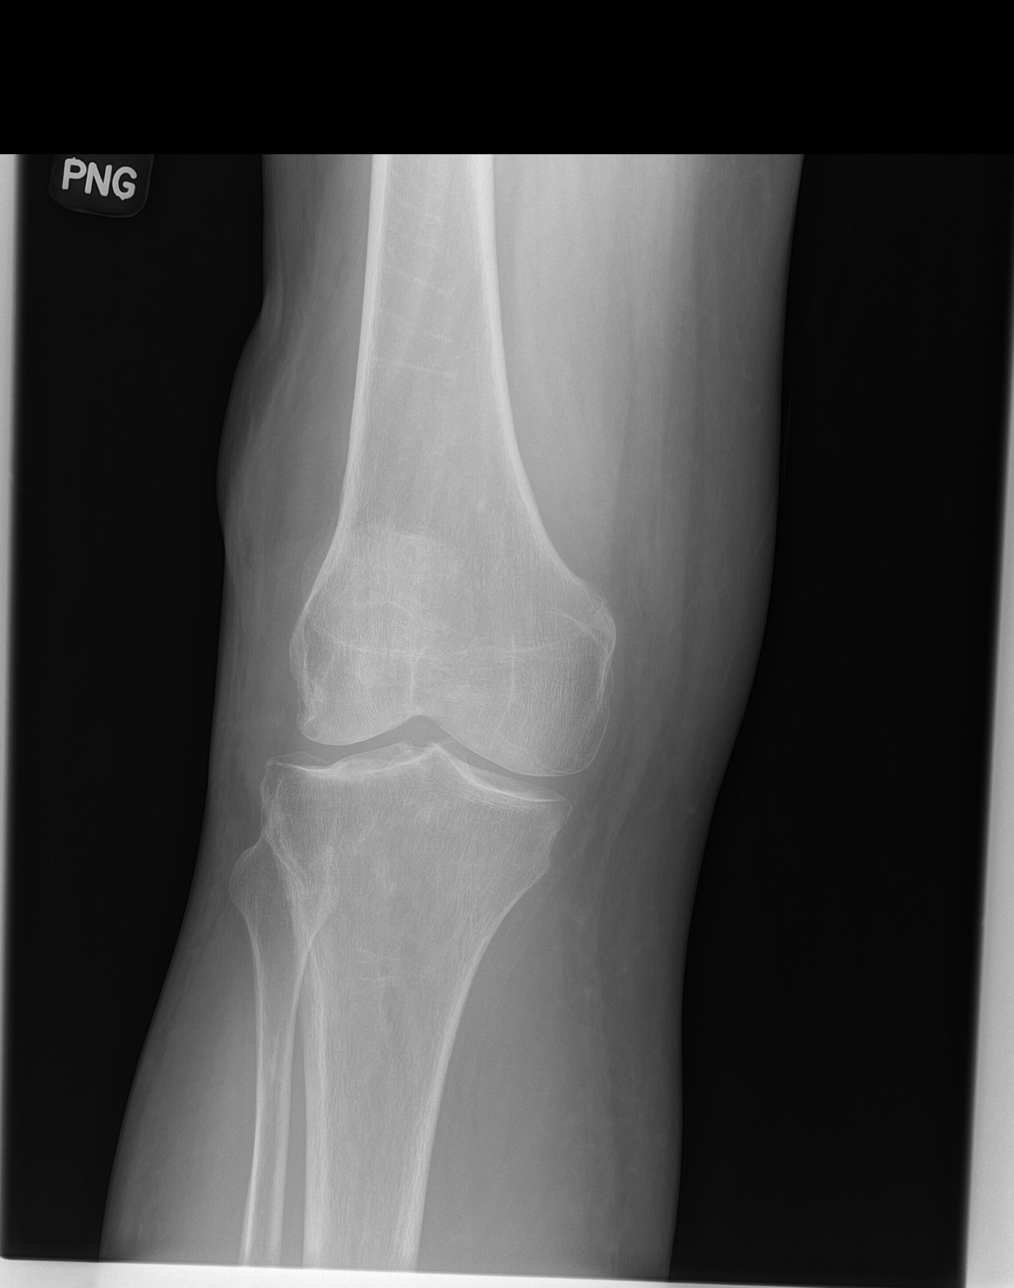

[knee obl (1 of 2)]
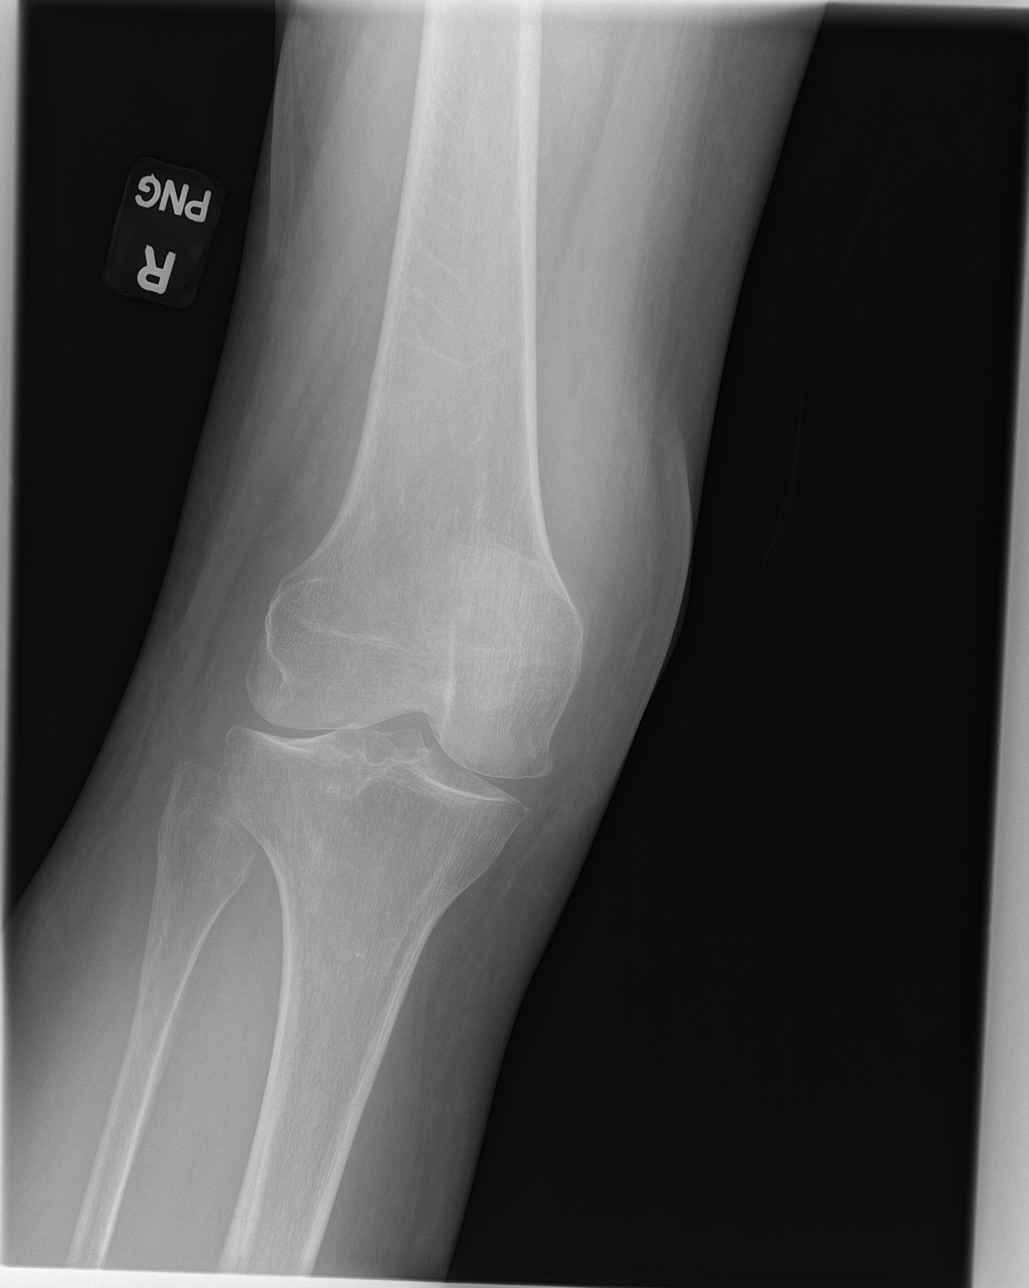

[knee obl (2 of 2)]
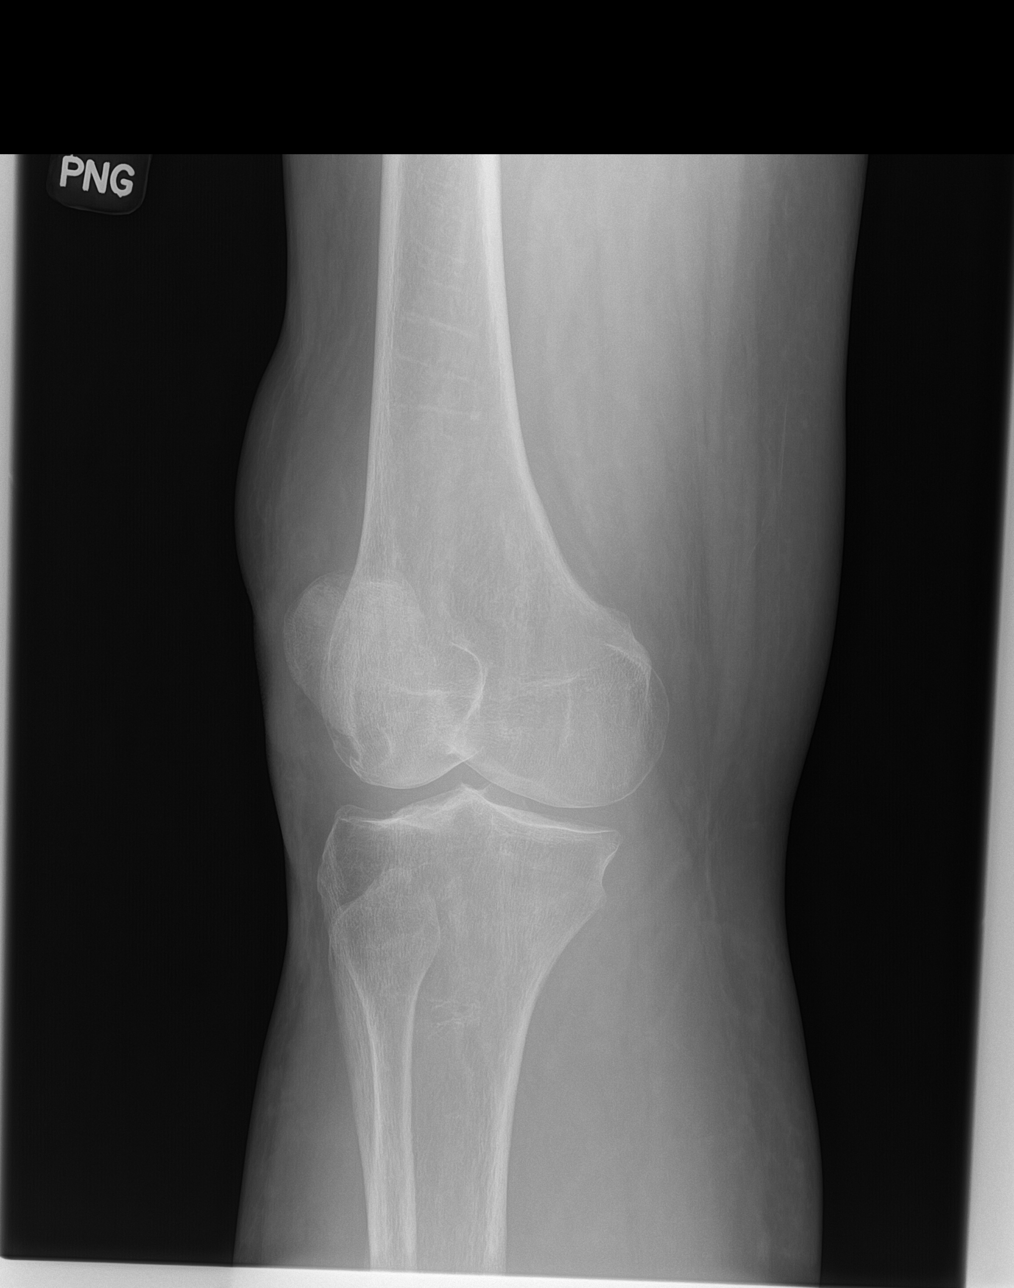

[knee lat]
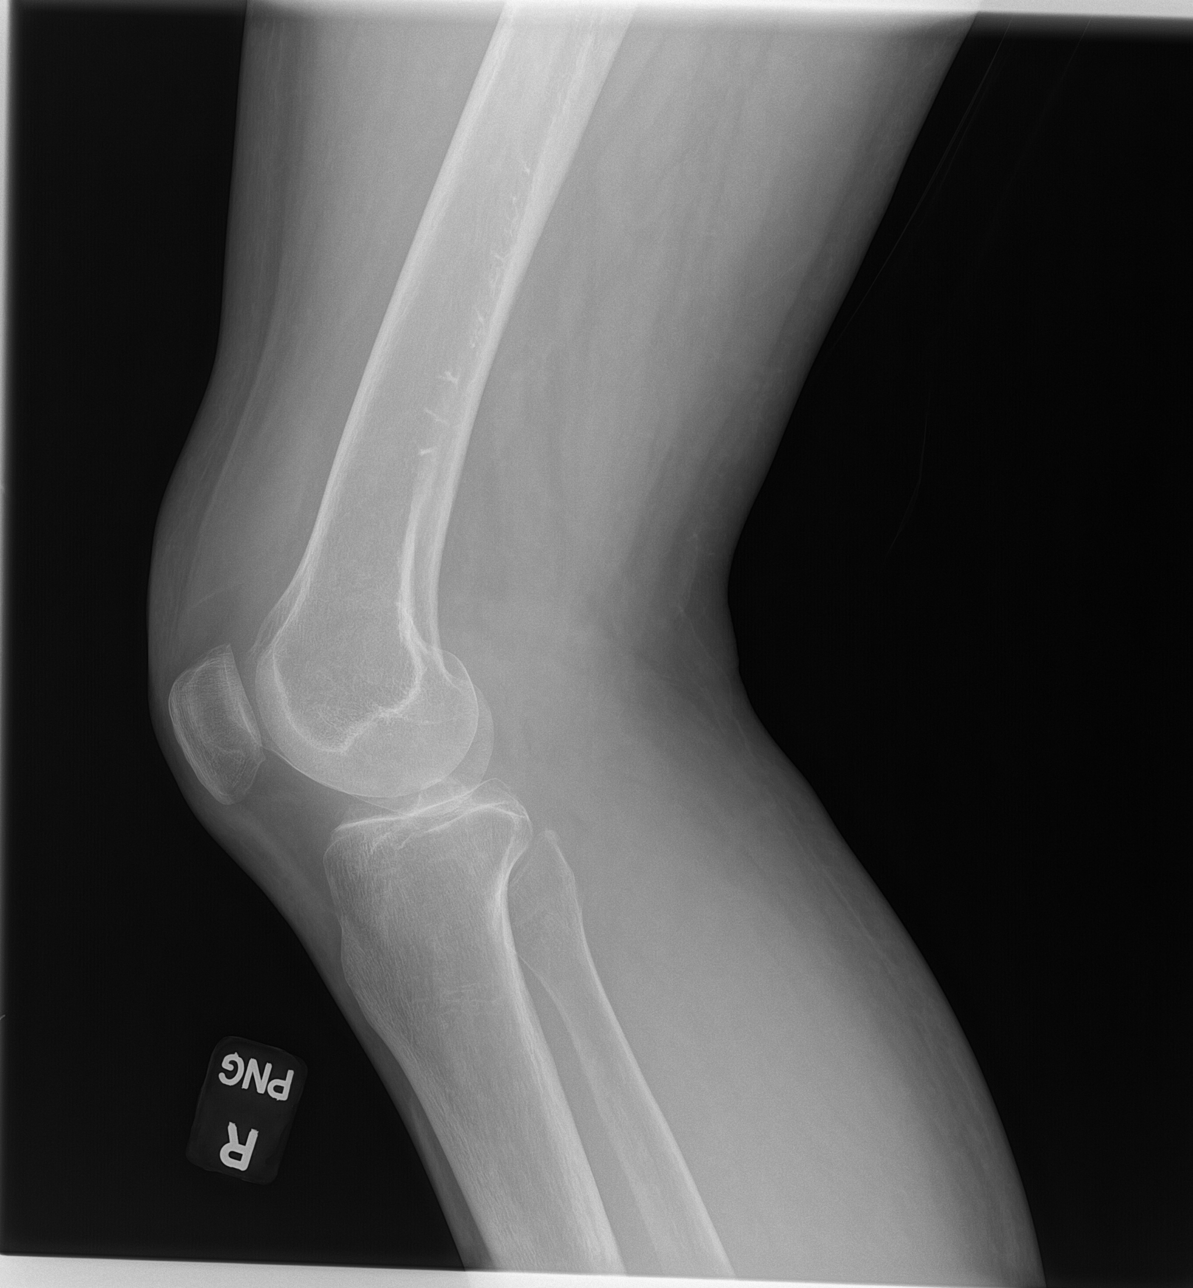

[4 of 4 positions shown; findings below may reference images not displayed]

FINDINGS: There is mild narrowing of the LATERAL and patellofemoral
compartments. No acute fracture or subluxation. No joint effusion.
No suspicious lesions.
IMPRESSION: Mild degenerative changes.  No evidence for acute  abnormality.

## 2021-02-03 NOTE — Assessment & Plan Note (Signed)
Patient does have an effusion of the right knee noted today.  There is even a synovitis noted on ultrasound.  Unable to save picture secondary to difficulty.  Patient has had a Doppler that did rule out any type of clot.  Patient states that she has had an injection within the last week from her rheumatologist.  Unfortunately I do not have this note at the moment.  Discussed with her we could consider the possible aspiration but patient declined that as well as declined.  Laboratory work-up at this time with patient still having an outstanding bill she states from her other physicians.  We will get an MRI of the knee and the tib-fib to rule out any type of intra-articular difficulty as well as rule out any type of osteomyelitis that could be contributing.  Patient is having significant amount of pain she states but is able to move the knee relatively well.  Patient does have pain medication from her primary care from her and discussed with her that I would consider more of the topical anti-inflammatories or the possibility of injections.  Depending on MRI hopefully will get this done soon and discuss further treatment.  We discussed with patient she is covered with the clindamycin but if any redness, increased discomfort and pain, any shortness of breath to seek medical attention immediately.  We will discussed with patient after imaging.  Total time with patient and reviewing what we did have from outside facility 35 minutes

## 2021-02-03 NOTE — Patient Instructions (Addendum)
Good to see you Xrays today MRI right knee and tib fib [place] Consider compression socks to help with swelling Ice 20 mins every 4 hours to the knee pennsaid 2 times a day over the most painful spot If swelling gets worse or redness seek medical attention immediately We will write you after MRI to discuss next steps

## 2021-02-04 ENCOUNTER — Telehealth: Payer: Self-pay | Admitting: Family Medicine

## 2021-02-04 NOTE — Telephone Encounter (Signed)
Pt seems confused about whether or not she should have had the fluid drained from her knee before her MRI. She thinks Dr. Tamala Julian did NOT want her to, but another provider has told her she SHOULD have had it drained before the MRI. Wants to confirm what is best.

## 2021-02-07 ENCOUNTER — Telehealth: Payer: Self-pay | Admitting: Family Medicine

## 2021-02-07 DIAGNOSIS — M25571 Pain in right ankle and joints of right foot: Secondary | ICD-10-CM

## 2021-02-07 NOTE — Telephone Encounter (Signed)
FYI.[name] called with a STAT report from the patient's right ankle xray.  EXAM: RIGHT ANKLE - COMPLETE 3+ VIEW  FINDINGS: There is significant soft tissue swelling of the ankle and foot. On the LATERAL view, there is possible fracture of the proximal phalanx of the 5th digit warranting further evaluation. The ankle is intact.  IMPRESSION: 1. Significant soft tissue swelling. 2. Possible fracture of the 5th proximal phalanx. Recommend dedicated views of the fluid.

## 2021-02-07 NOTE — Telephone Encounter (Signed)
Ordered right ankle MRI.

## 2021-02-07 NOTE — Telephone Encounter (Signed)
Seen it, patient is getting MRI of the knee and leg.  Can we see if we can add ankle as well

## 2021-02-08 NOTE — Telephone Encounter (Signed)
No I would recommend not having it done in case it changes what is going on in the knee.

## 2021-02-08 NOTE — Telephone Encounter (Signed)
Sent patient MyChart message.

## 2021-02-10 NOTE — Progress Notes (Deleted)
NEUROLOGY CONSULTATION NOTE  Azrielle Springsteen MRN: [id number] DOB: ***  Referring provider: *** Primary care provider: ***  Reason for consult:  ***  Assessment/Plan:   ***   Subjective:  Brittany Hobbs is a 60 year old ***-handed female with asthma, anxiety and Lupus who presents for postconcussion syndrome.  History supplemented by referring provider's note.  On 10/16/2020, she got up to go to the bathroom and ***, falling and hitting her head.  ***.  Following this event, she has had headaches ***.  She also reported memory problems.  She [place] the airport.  She also started seeing people at night ***. No nausea or vomiting.  She had a CT head on 10/26/2020 which was unremarkable.  B12 and TSH from April were 285 and 2.03 respectively.  ***   Current medications:  ASA 325mg  QD Lexapro 20mg  QD Gabapentin 800mg  TID Adderall XR Lorazepam 0.5mg  BID PRN anxiety Lopressor  PAST MEDICAL HISTORY: Past Medical History:  Diagnosis Date  . Allergy   . Arthritis   . Asthma   . Diverticulitis   . Hyperlipidemia   . Lupus ([place])   . Panic attacks   . Seasonal allergies     PAST SURGICAL HISTORY: Past Surgical History:  Procedure Laterality Date  . PATELLA FRACTURE SURGERY Left   . scar tissue eye  1983   right  . TUBAL LIGATION  1985  . WRIST FRACTURE SURGERY Right     MEDICATIONS: Current Outpatient Medications on File Prior to Visit  Medication Sig Dispense Refill  . aspirin EC 81 MG tablet Take 81 mg by mouth daily.    Marland Kitchen escitalopram (LEXAPRO) 20 MG tablet Take 20 mg by mouth daily.    Marland Kitchen gabapentin (NEURONTIN) 400 MG capsule Take 400 mg by mouth 3 (three) times daily.     Marland Kitchen LORazepam (ATIVAN) 0.5 MG tablet Take 0.5-1 mg by mouth See admin instructions. 0.5mg  in AM and 1mg  in evening     No current facility-administered medications on file prior to visit.    ALLERGIES: Allergies  Allergen Reactions  . Iodine Anaphylaxis     Pt states she is allergic to INTERNAL IODINE  . Betadine [Povidone Iodine] Hives  . Sulfa Antibiotics     N/V    FAMILY HISTORY: Family History  Problem Relation Age of Onset  . Colon cancer Neg Hx   . Stomach cancer Neg Hx   . Esophageal cancer Neg Hx   . Rectal cancer Neg Hx     Objective:  *** General: No acute distress.  Patient appears well-groomed.   Head:  Normocephalic/atraumatic Eyes:  fundi examined but not visualized Neck: supple, no paraspinal tenderness, full range of motion Back: No paraspinal tenderness Heart: regular rate and rhythm Lungs: Clear to auscultation bilaterally. Vascular: No carotid bruits. Neurological Exam: Mental status: alert and oriented to person, place, and time, recent and remote memory intact, fund of knowledge intact, attention and concentration intact, speech fluent and not dysarthric, language intact. Cranial nerves: CN I: not tested CN II: pupils equal, round and reactive to light, visual fields intact CN III, IV, VI:  full range of motion, no nystagmus, no ptosis CN V: facial sensation intact. CN VII: upper and lower face symmetric CN VIII: hearing intact CN IX, X: gag intact, uvula midline CN XI: sternocleidomastoid and trapezius muscles intact CN XII: tongue midline Bulk & Tone: normal, no fasciculations. Motor:  muscle strength 5/5 throughout Sensation:  Pinprick, temperature and vibratory sensation intact. Deep Tendon Reflexes:  2+ throughout,  toes downgoing.   Finger to nose testing:  Without dysmetria.   Heel to shin:  Without dysmetria.   Gait:  Normal station and stride.  Romberg negative.    Thank you for allowing me to take part in the care of this patient.  Metta Clines, DO  CC: Eldridge Abrahams, NP

## 2021-02-11 ENCOUNTER — Ambulatory Visit: Payer: BC Managed Care – PPO | Admitting: Neurology

## 2021-02-12 ENCOUNTER — Ambulatory Visit (INDEPENDENT_AMBULATORY_CARE_PROVIDER_SITE_OTHER): Payer: BC Managed Care – PPO

## 2021-02-12 ENCOUNTER — Other Ambulatory Visit: Payer: Self-pay

## 2021-02-12 DIAGNOSIS — G8929 Other chronic pain: Secondary | ICD-10-CM | POA: Diagnosis not present

## 2021-02-12 DIAGNOSIS — M25461 Effusion, right knee: Secondary | ICD-10-CM | POA: Diagnosis not present

## 2021-02-12 DIAGNOSIS — M25561 Pain in right knee: Secondary | ICD-10-CM

## 2021-02-12 DIAGNOSIS — M25571 Pain in right ankle and joints of right foot: Secondary | ICD-10-CM

## 2021-02-12 IMAGING — MR MR ANKLE*R* W/O CM
5 series · 40 of 40 positions shown · non-contrast
Comparison: None.

CLINICAL DATA: Right ankle pain for 6 weeks

EXAM:
MRI OF THE RIGHT ANKLE WITHOUT CONTRAST
TECHNIQUE: Multiplanar, multisequence MR imaging of the ankle was performed. No
intravenous contrast was administered.

[Series 3: PD fat-sat · axial · 3.0mm · 0.70mm/px · z∈[-124,+5]mm · 10 of 40 slices shown]
[im 1/40]
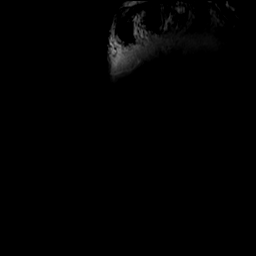
[im 5/40]
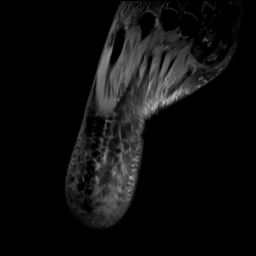
[im 9/40]
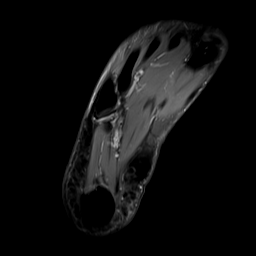
[im 14/40]
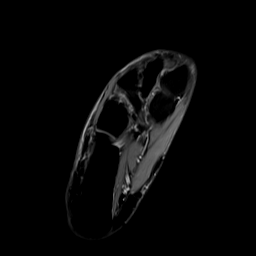
[im 18/40]
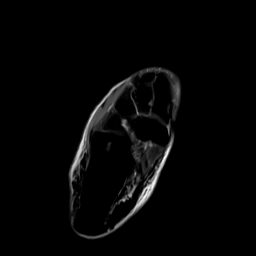
[im 22/40]
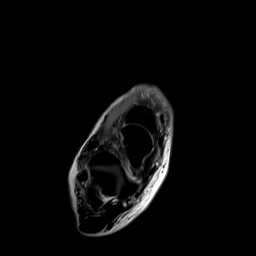
[im 27/40]
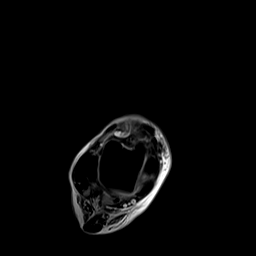
[im 31/40]
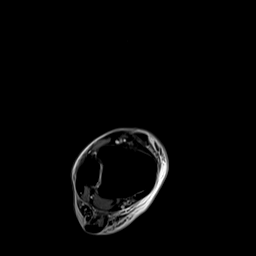
[im 35/40]
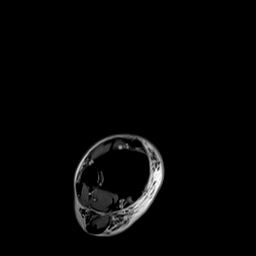
[im 40/40]
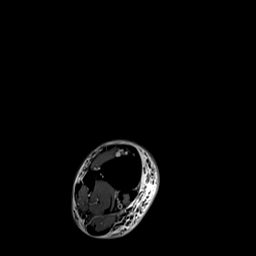

[Series 4: T2 fat-sat · axial · 3.0mm · 0.70mm/px · z∈[-124,+5]mm · 9 of 40 slices shown]
[im 1/40]
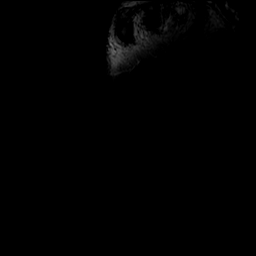
[im 5/40]
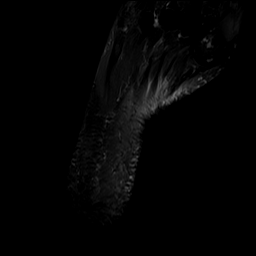
[im 10/40]
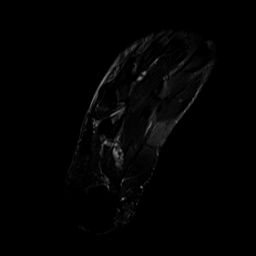
[im 15/40]
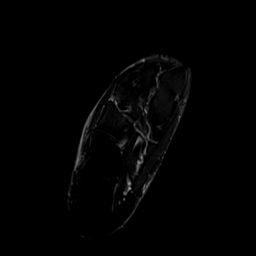
[im 20/40]
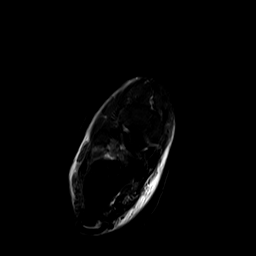
[im 25/40]
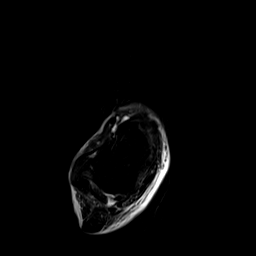
[im 30/40]
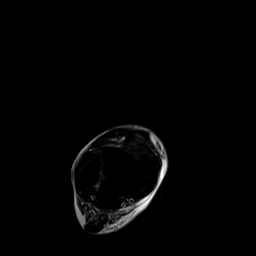
[im 35/40]
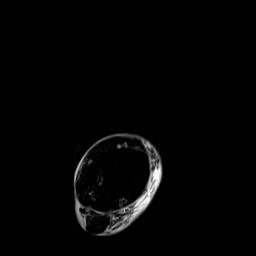
[im 40/40]
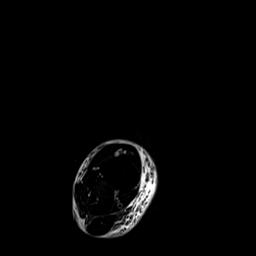

[Series 6: T1 · sagittal · 3.0mm · 0.56mm/px · 5 of 23 slices shown]
[im 1/23]
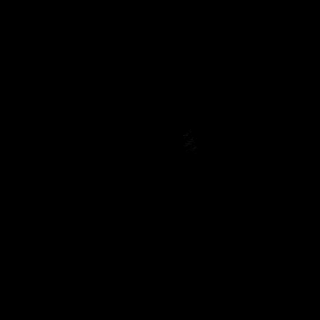
[im 6/23]
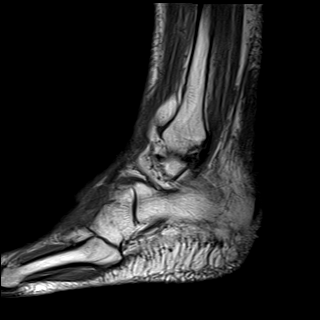
[im 12/23]
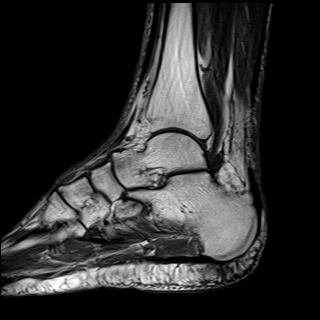
[im 17/23]
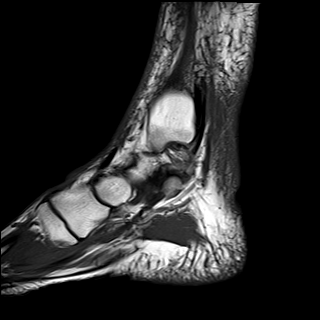
[im 23/23]
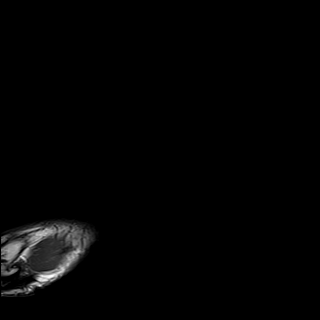

[Series 7: STIR · sagittal · 3.0mm · 0.70mm/px · 5 of 23 slices shown (1 of 2)]
[im 1/23]
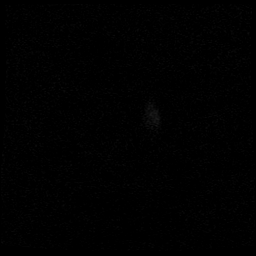
[im 6/23]
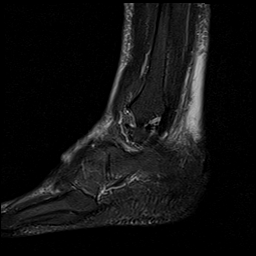
[im 12/23]
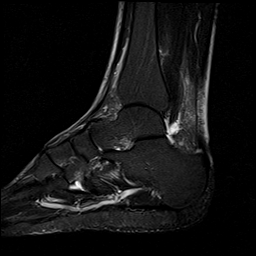
[im 17/23]
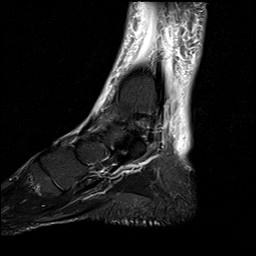
[im 23/23]
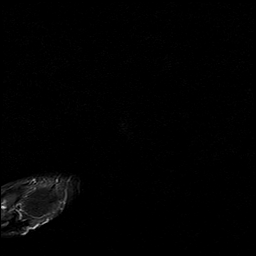

[Series 8: STIR · coronal · 3.0mm · 0.70mm/px · 11 of 45 slices shown (2 of 2)]
[im 1/45]
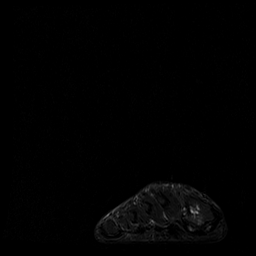
[im 5/45]
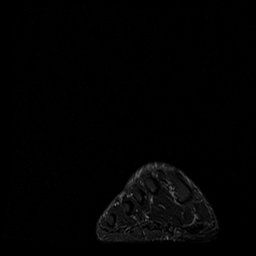
[im 9/45]
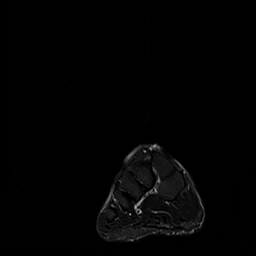
[im 14/45]
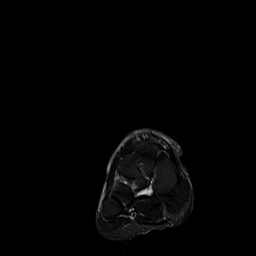
[im 18/45]
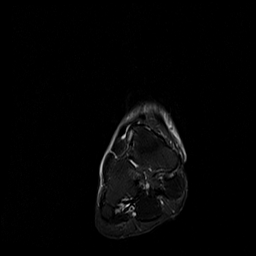
[im 23/45]
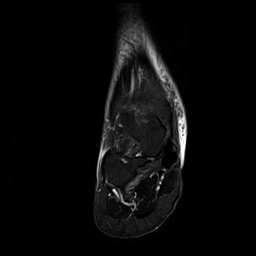
[im 27/45]
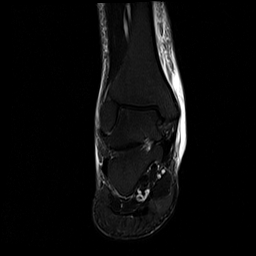
[im 31/45]
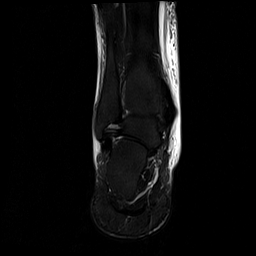
[im 36/45]
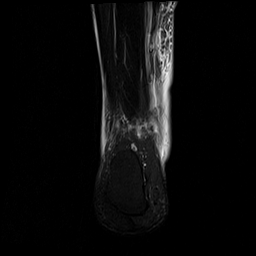
[im 40/45]
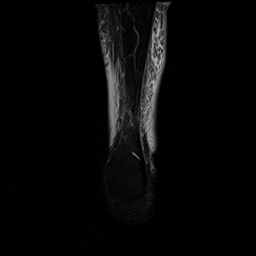
[im 45/45]
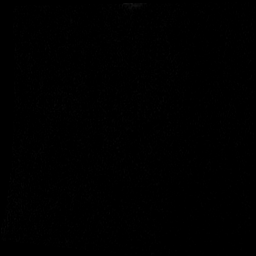

[40 of 40 positions shown; findings below may reference images not displayed]

FINDINGS: TENDONS

Peroneal: Peroneal longus tendon intact. Peroneal brevis intact.

Posteromedial: Posterior tibial tendon intact. Flexor hallucis
longus tendon intact. Flexor digitorum longus tendon intact.

Anterior: Tibialis anterior tendon intact. Extensor hallucis longus
tendon intact Extensor digitorum longus tendon intact.

Achilles:  Intact. Mild edema in Kager's fat.

Plantar Fascia: Intact.

LIGAMENTS

Lateral: Anterior talofibular ligament intact. Calcaneofibular
ligament intact. Posterior talofibular ligament intact. Anterior and
posterior tibiofibular ligaments intact.

Medial: Deltoid ligament intact. Spring ligament intact.

CARTILAGE

Ankle Joint: No joint effusion. Normal ankle mortise. No chondral
defect.

Subtalar Joints/Sinus Tarsi: Normal subtalar joints. No subtalar
joint effusion. Normal sinus tarsi.

Bones: No marrow signal abnormality.  No fracture or dislocation.

Soft Tissue: No fluid collection or hematoma. Muscles are normal
without edema or atrophy. Tarsal tunnel is normal. Soft tissue edema
throughout the left lower leg and around the ankle.
IMPRESSION: 1. No internal derangement of right ankle.
2. Soft tissue edema throughout the left lower leg and around the
ankle.

## 2021-02-12 IMAGING — MR MR [PERSON_NAME] LOW W/O CM*R*
7 series · 40 of 40 positions shown · non-contrast
Comparison: None.

CLINICAL DATA: Right lower leg pain. Pain proximal to the patella
for 6 weeks.

EXAM:
MRI OF LOWER RIGHT EXTREMITY WITHOUT CONTRAST
TECHNIQUE: Multiplanar, multisequence MR imaging of the right lower leg was
performed. No intravenous contrast was administered.

[Series 10: T2 · coronal · 6.5mm · 1.56mm/px · 6 of 22 slices shown]
[im 1/22]
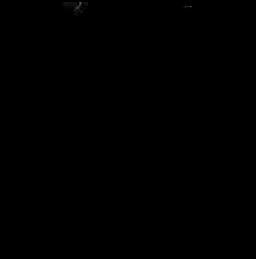
[im 5/22]
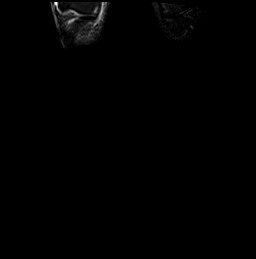
[im 9/22]
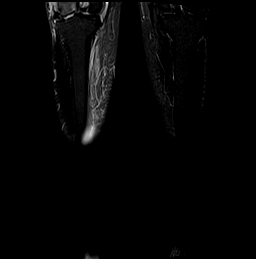
[im 13/22]
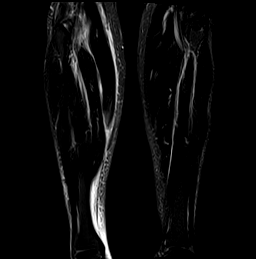
[im 17/22]
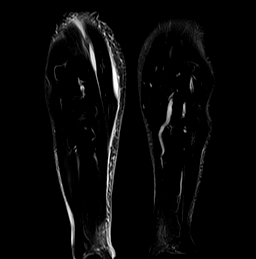
[im 22/22]
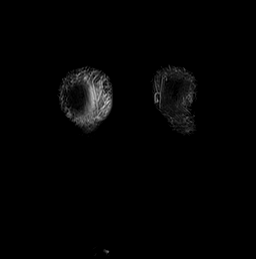

[Series 11: T1 · axial · 7.5mm · 0.78mm/px · z∈[-89,+164]mm · 6 of 27 slices shown (1 of 3)]
[im 1/27]
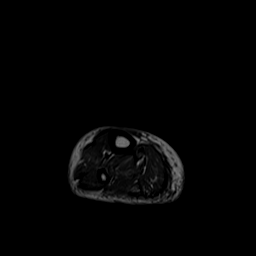
[im 6/27]
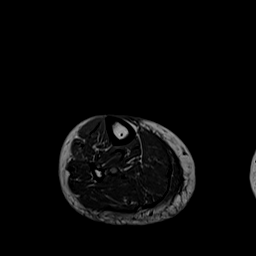
[im 11/27]
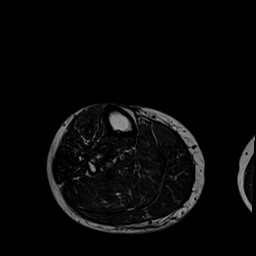
[im 16/27]
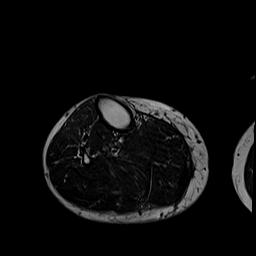
[im 21/27]
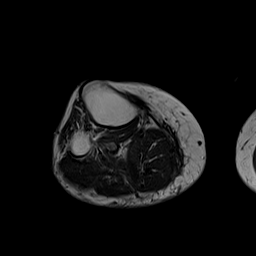
[im 27/27]
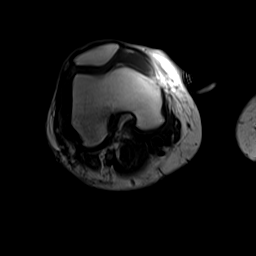

[Series 12: T1 · axial · 7.5mm · 0.78mm/px · z∈[-219,+34]mm · 6 of 27 slices shown (2 of 3)]
[im 1/27]
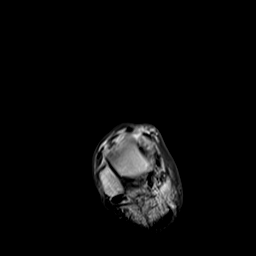
[im 6/27]
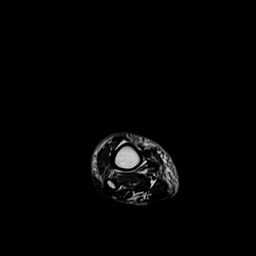
[im 11/27]
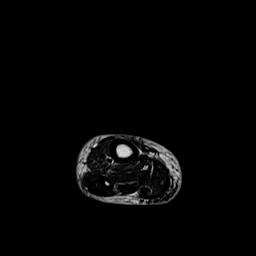
[im 16/27]
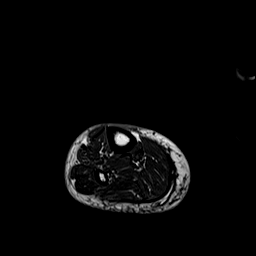
[im 21/27]
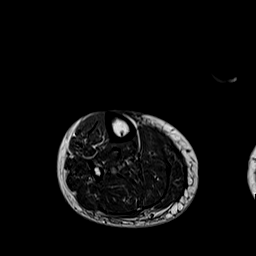
[im 27/27]
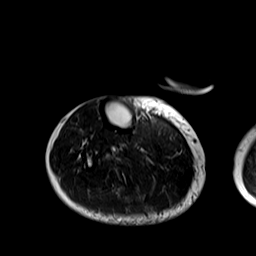

[Series 14: STIR · axial · 7.5mm · 0.78mm/px · z∈[-89,+164]mm · 6 of 27 slices shown (1 of 2)]
[im 1/27]
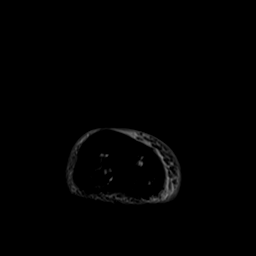
[im 6/27]
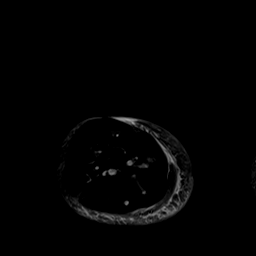
[im 11/27]
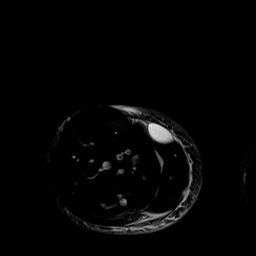
[im 16/27]
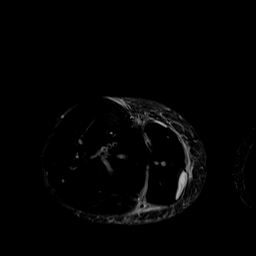
[im 21/27]
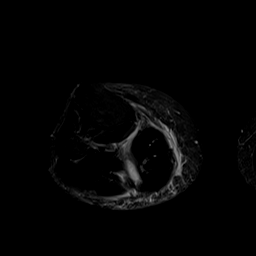
[im 27/27]
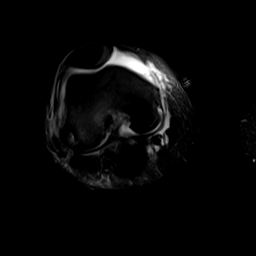

[Series 15: STIR · axial · 7.5mm · 0.78mm/px · z∈[-219,+34]mm · 6 of 27 slices shown (2 of 2)]
[im 1/27]
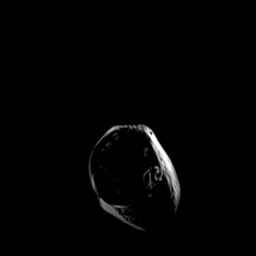
[im 6/27]
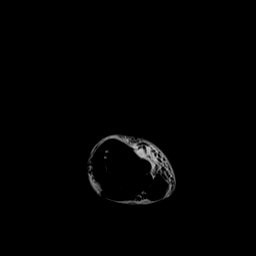
[im 11/27]
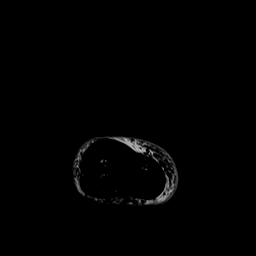
[im 16/27]
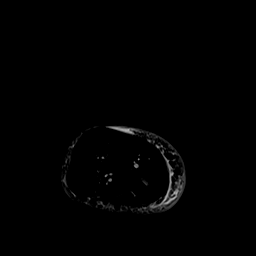
[im 21/27]
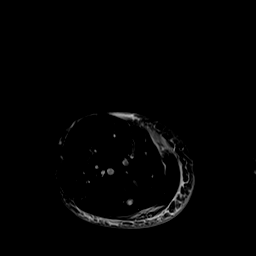
[im 27/27]
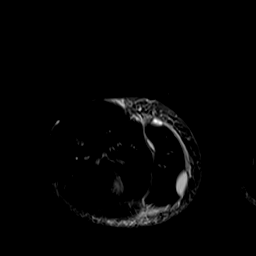

[Series 19: sag stir_comp_ad_8 · sagittal · 6.5mm · 0.94mm/px · 5 of 20 slices shown]
[im 1/20]
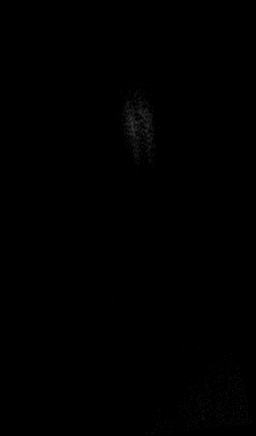
[im 5/20]
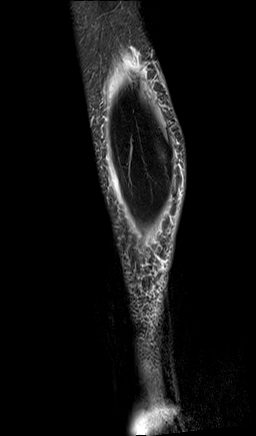
[im 10/20]
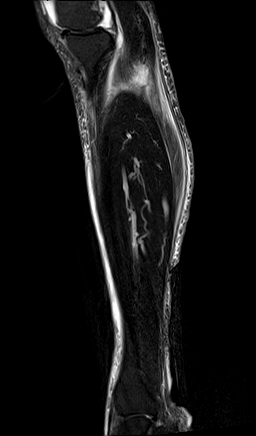
[im 15/20]
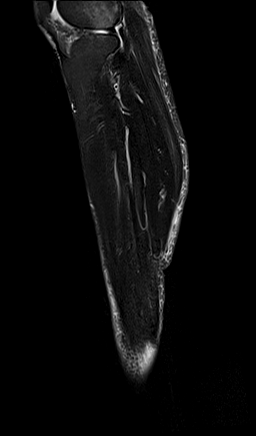
[im 20/20]
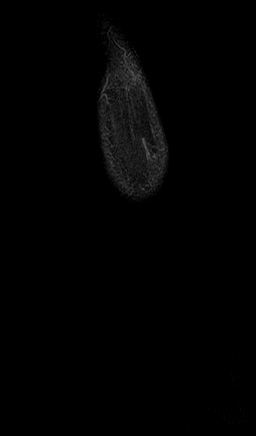

[Series 22: T1 · coronal · non-contrast · 6.5mm · 0.78mm/px · 5 of 22 slices shown (3 of 3)]
[im 1/22]
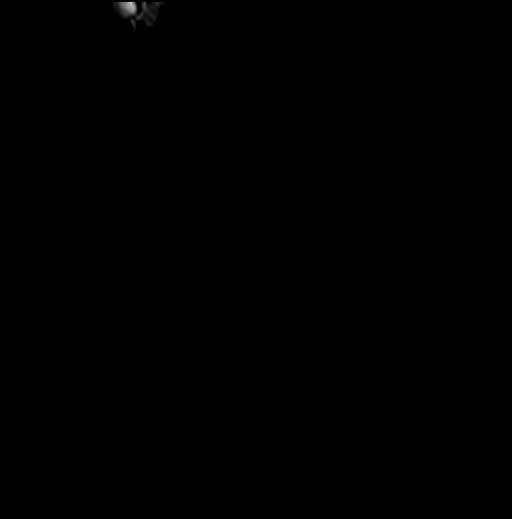
[im 6/22]
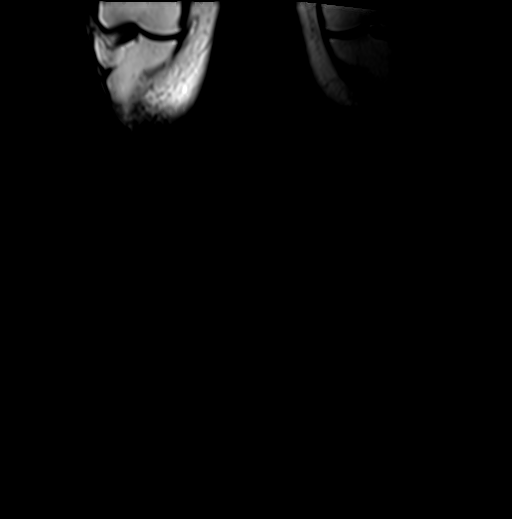
[im 11/22]
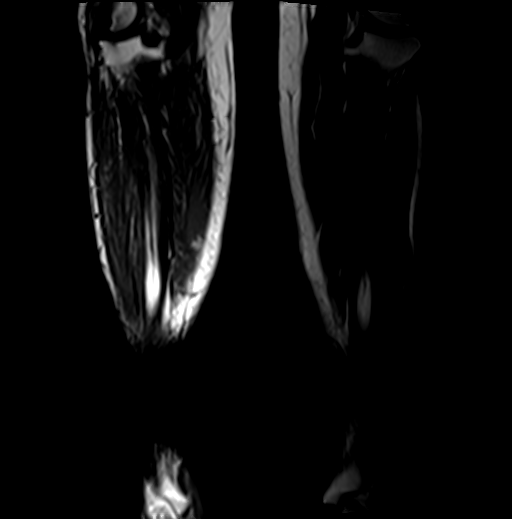
[im 16/22]
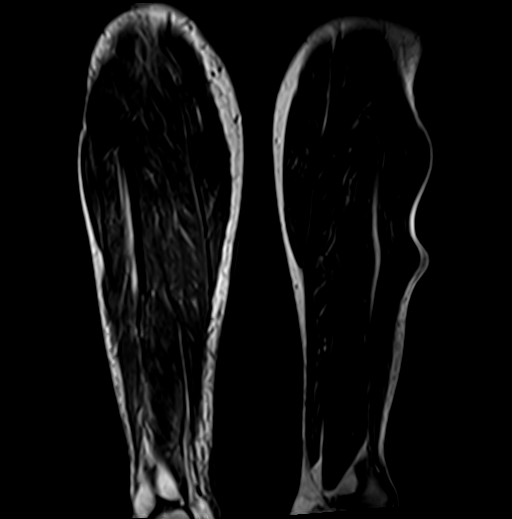
[im 22/22]
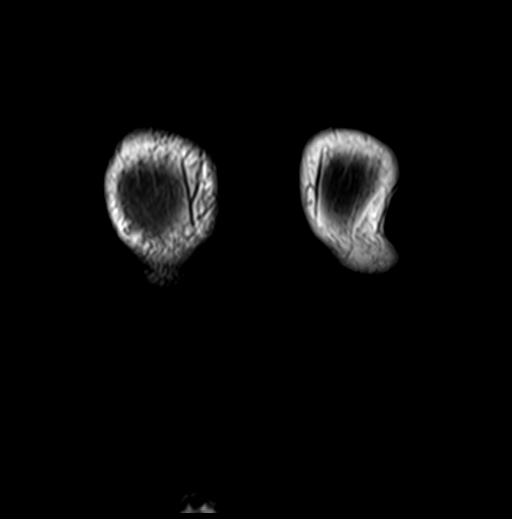

[40 of 40 positions shown; findings below may reference images not displayed]

FINDINGS: Bones/Joint/Cartilage

No marrow signal abnormality. No fracture or dislocation. Normal
alignment. Large joint effusion partially visualized. Ruptured
Baker's cyst.

Ligaments

Collateral ligaments are intact.

Muscles and Tendons
Muscles are normal. 2 cm T1 intermediate signal and T2 hyperintense
signal fluid collection along the anterior peripheral inferior
aspect of the medial gastrocnemius muscle likely reflecting a
hematoma. Small simple fluid collection along the superficial aspect
of the medial gastrocnemius muscle slightly more posteriorly. Fluid
signal is seen between the medial gastrocnemius muscle and soleus
muscle which may be a result of the ruptured Baker's cyst, but a
plantaris tear cannot be excluded. Mild edema in the medial aspect
of the lateral gastrocnemius muscle likely reflecting mild muscle
strain.

Soft tissue
No fluid collection or hematoma.  No soft tissue mass.
IMPRESSION: 1. Small simple fluid collection along the superficial aspect of the
medial gastrocnemius muscle posteriorly likely result of the
ruptured Baker's cyst. Fluid signal is seen between the medial
gastrocnemius muscle and soleus muscle which may be a result of the
ruptured Baker's cyst, but a plantaris tear cannot be excluded. 2 cm
hematoma along the anterior peripheral inferior aspect of the medial
gastrocnemius muscle .
2. Ruptured Baker's cyst.
3. Large knee joint effusion.

## 2021-02-12 IMAGING — MR MR KNEE*R* W/O CM
7 series · 40 of 40 positions shown · non-contrast
Comparison: None.

CLINICAL DATA: Chronic right knee pain.  Ongoing for 6 weeks.

EXAM:
MRI OF THE RIGHT KNEE WITHOUT CONTRAST
TECHNIQUE: Multiplanar, multisequence MR imaging of the knee was performed. No
intravenous contrast was administered.

[Series 3: T2 fat-sat · axial · 4.0mm · 0.53mm/px · z∈[-86,+84]mm · 6 of 35 slices shown (1 of 3)]
[im 1/35]
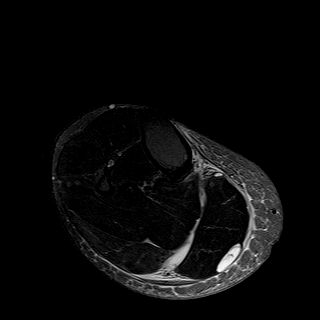
[im 7/35]
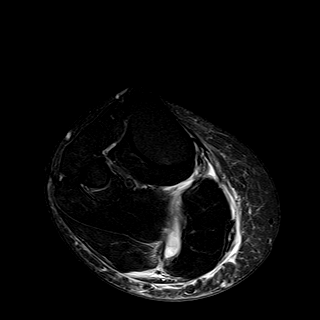
[im 14/35]
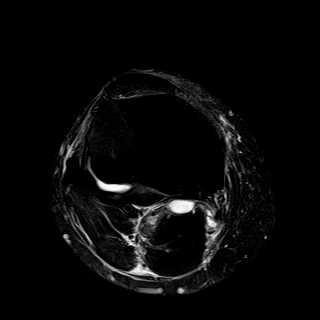
[im 21/35]
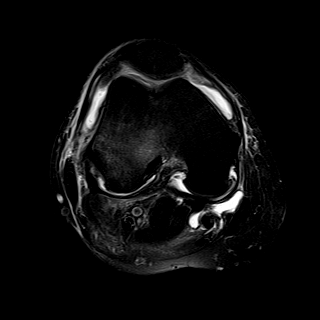
[im 28/35]
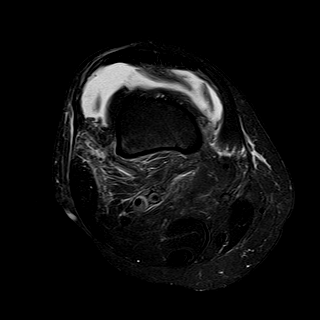
[im 35/35]
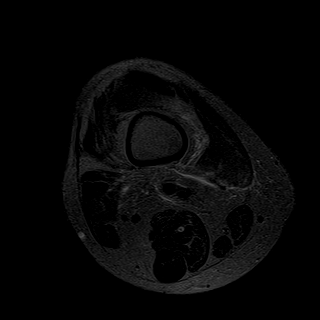

[Series 4: T1 · coronal · 4.0mm · 0.62mm/px · 6 of 30 slices shown]
[im 1/30]
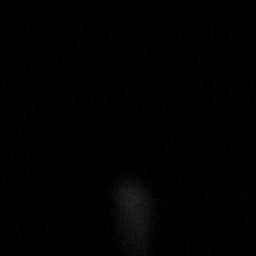
[im 6/30]
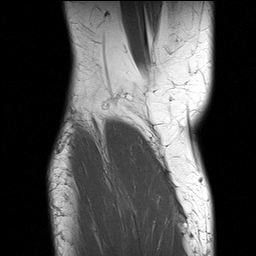
[im 12/30]
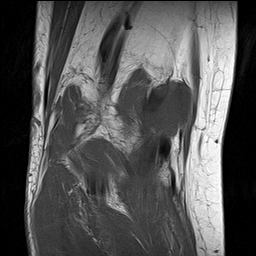
[im 18/30]
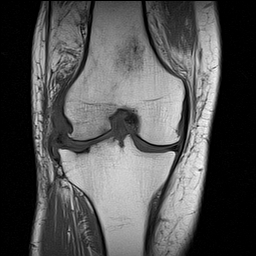
[im 24/30]
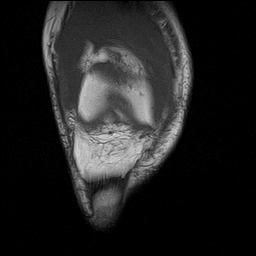
[im 30/30]
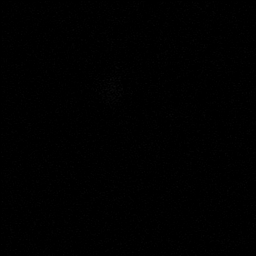

[Series 5: PD fat-sat · coronal · 4.0mm · 0.62mm/px · 6 of 30 slices shown (1 of 3)]
[im 1/30]
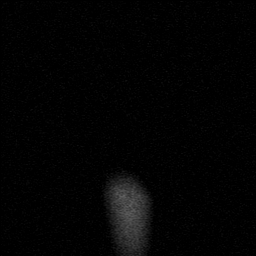
[im 6/30]
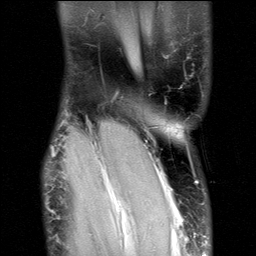
[im 12/30]
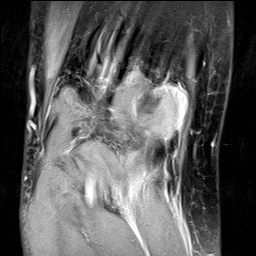
[im 18/30]
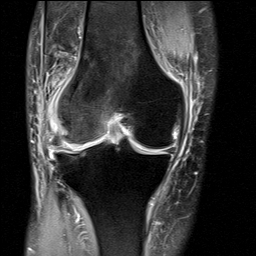
[im 24/30]
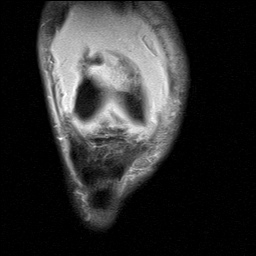
[im 30/30]
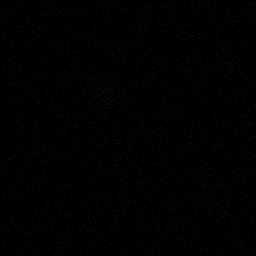

[Series 6: PD fat-sat · sagittal · 3.0mm · 0.62mm/px · 6 of 34 slices shown (2 of 3)]
[im 1/34]
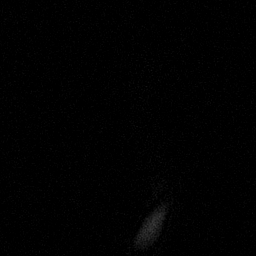
[im 7/34]
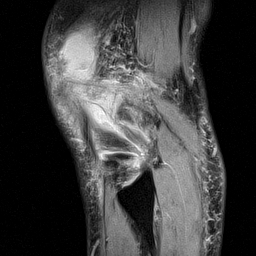
[im 14/34]
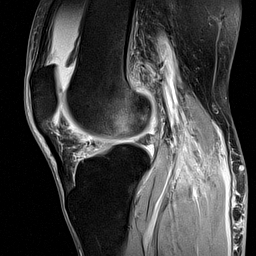
[im 20/34]
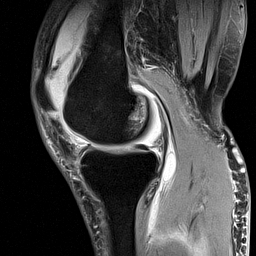
[im 27/34]
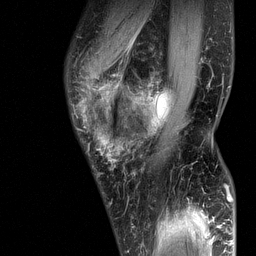
[im 34/34]
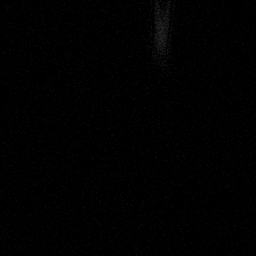

[Series 7: T2 fat-sat · sagittal · 3.0mm · 0.62mm/px · 6 of 34 slices shown (2 of 3)]
[im 1/34]
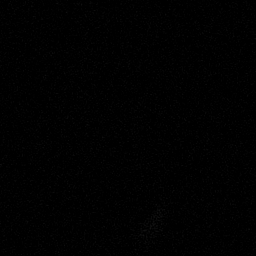
[im 7/34]
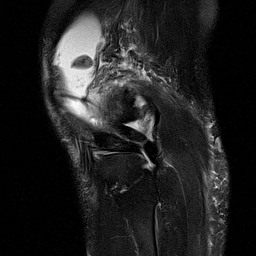
[im 14/34]
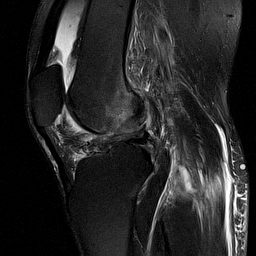
[im 20/34]
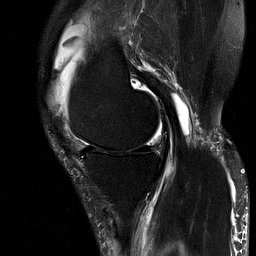
[im 27/34]
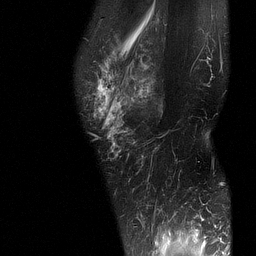
[im 34/34]
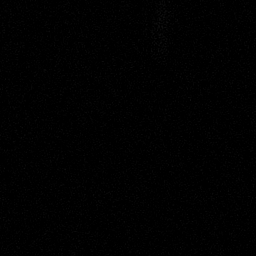

[Series 8: PD fat-sat · oblique · 2.0mm · 0.62mm/px · 4 of 19 slices shown (3 of 3)]
[im 1/19]
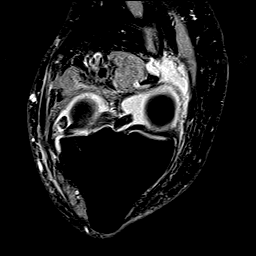
[im 7/19]
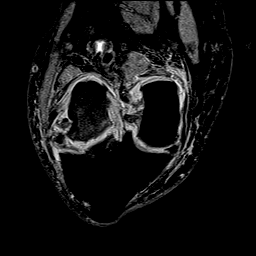
[im 13/19]
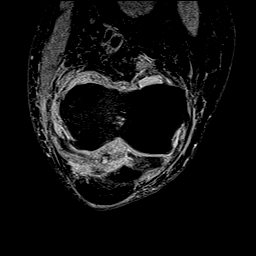
[im 19/19]
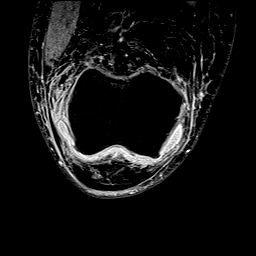

[Series 9: T2 fat-sat · coronal · 4.0mm · 0.62mm/px · 6 of 30 slices shown (3 of 3)]
[im 1/30]
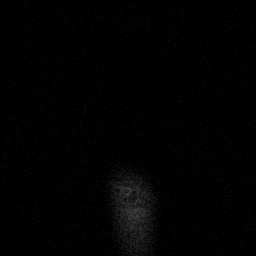
[im 6/30]
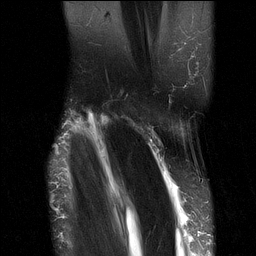
[im 12/30]
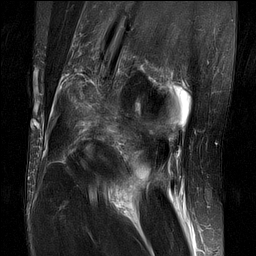
[im 18/30]
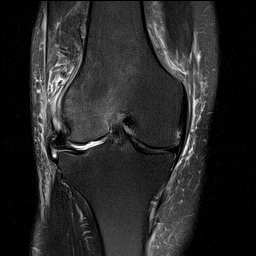
[im 24/30]
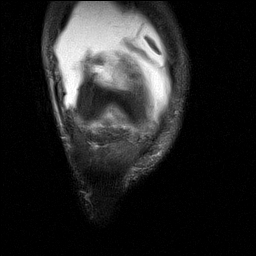
[im 30/30]
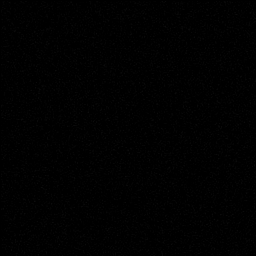

[40 of 40 positions shown; findings below may reference images not displayed]

FINDINGS: MENISCI

Medial: Intact.

Lateral: Maceration of the anterior horn and body of the lateral
meniscus. Degeneration of the posterior horn of the lateral meniscus
with irregularity along the inferior surface.

LIGAMENTS

Cruciates: ACL and PCL are intact.

Collaterals: Medial collateral ligament is intact. Lateral
collateral ligament complex is intact.

CARTILAGE

Patellofemoral:  No chondral defect.

Medial: Mild partial-thickness cartilage loss of the medial
femorotibial compartment.

Lateral: Extensive full-thickness cartilage loss of the lateral
femorotibial compartment. Severe subchondral marrow edema in the
medial femoral condyle and to lesser extent medial tibial plateau.

JOINT: Large joint effusion. Edema in Hoffa's fat. No plical
thickening.

POPLITEAL FOSSA: Popliteus tendon is intact. Ruptured Baker's cyst.

EXTENSOR MECHANISM: Intact quadriceps tendon. Intact patellar
tendon. Intact lateral patellar retinaculum. Intact medial patellar
retinaculum. Intact MPFL.

BONES: No aggressive osseous lesion. No fracture or dislocation.

Other: No fluid collection or hematoma. Muscles are normal.
IMPRESSION: 1. Maceration of the anterior horn and body of the lateral meniscus.
Degeneration of the posterior horn of the lateral meniscus with
irregularity along the inferior surface.
2. Extensive full-thickness cartilage loss of the lateral
femorotibial compartment with subchondral reactive marrow edema.
3. Severe subchondral marrow edema in the lateral femoral condyle
disproportionate to the degree of overlying cartilage loss may
reflect an element of stress reaction.
4. Large joint effusion.
5. Ruptured Baker's cyst.

## 2021-02-13 ENCOUNTER — Encounter: Payer: Self-pay | Admitting: Family Medicine

## 2021-02-15 ENCOUNTER — Other Ambulatory Visit: Payer: Self-pay

## 2021-02-15 DIAGNOSIS — M25561 Pain in right knee: Secondary | ICD-10-CM

## 2021-02-15 NOTE — Telephone Encounter (Signed)
Referral placed.

## 2021-02-15 NOTE — Telephone Encounter (Signed)
Patient is aware. Can this referral be sent over please?

## 2021-02-15 NOTE — Telephone Encounter (Signed)
Patient would like to move forward with surgical referral.

## 2021-02-18 ENCOUNTER — Ambulatory Visit: Payer: BC Managed Care – PPO | Admitting: Neurology

## 2021-02-23 ENCOUNTER — Other Ambulatory Visit: Payer: Self-pay

## 2021-02-23 ENCOUNTER — Ambulatory Visit (INDEPENDENT_AMBULATORY_CARE_PROVIDER_SITE_OTHER): Payer: BC Managed Care – PPO | Admitting: Orthopaedic Surgery

## 2021-02-23 ENCOUNTER — Encounter: Payer: Self-pay | Admitting: Orthopaedic Surgery

## 2021-02-23 DIAGNOSIS — M1711 Unilateral primary osteoarthritis, right knee: Secondary | ICD-10-CM

## 2021-02-23 MED ORDER — LIDOCAINE HCL 1 % IJ SOLN
5.0000 mL | INTRAMUSCULAR | Status: AC | PRN
  Administered 2021-02-23: 5 mL

## 2021-02-23 MED ORDER — METHYLPREDNISOLONE ACETATE 40 MG/ML IJ SUSP
40.0000 mg | INTRAMUSCULAR | Status: AC | PRN
  Administered 2021-02-23: 40 mg via INTRA_ARTICULAR

## 2021-02-23 NOTE — Progress Notes (Signed)
Office Visit Note   Patient: Brittany Hobbs           Date of Birth: [birth year]           MRN: [id number] Visit Date: 02/23/2021              Requested by: Lyndal Pulley, DO [place],  [place] [address] PCP: Berkley Harvey, NP   Assessment & Plan: Visit Diagnoses:  1. Primary osteoarthritis of right knee     Plan:  We will try to gain approval for supplemental injection for the right knee.  Have her back once this is available.  Discussed with patient the MRI findings and the fact that she has significant arthritis in the lateral compartment of her knee.  She has some degenerative changes in the meniscus but this is not truly driving the pain she is having knee.  It is felt that the arthritis is the main culprit in regards to her pain and mechanical symptoms.  Knee arthroscopy is not indicated.  Questions and Courage and answered by Dr. Ninfa Linden and myself.  Follow-Up Instructions: Return for Supplemental injection.   Orders:  Orders Placed This Encounter  Procedures  . Large Joint Inj   No orders of the defined types were placed in this encounter.     Procedures: Large Joint Inj: R knee on 02/23/2021 5:41 PM Indications: pain Details: 22 G 1.5 in needle, superolateral approach  Arthrogram: No  Medications: 40 mg methylPREDNISolone acetate 40 MG/ML; 5 mL lidocaine 1 % Aspirate: 40 mL yellow Outcome: tolerated well, no immediate complications Procedure, treatment alternatives, risks and benefits explained, specific risks discussed. Consent was given by the patient. Immediately prior to procedure a time out was called to verify the correct patient, procedure, equipment, support staff and site/side marked as required. Patient was prepped and draped in the usual sterile fashion.       Clinical Data: No additional findings.   Subjective: Chief Complaint  Patient presents with  . Right Knee - Pain    HPI Brittany Hobbs is a 60 year old female  were seen for the first time for her right knee pain.  She states her knee pain began after a fall in January when she hit her head.  She is unsure of the exact mechanism.  She states she just blacked out.  These "blackouts" are being worked up by her primary care physician and she is due to see ENT and ophthalmology.  She comes in today after having x-rays of her right knee and an MRI of her right knee.  She states that the right knee does give way.  She is trying to brace otherwise no treatments.  She does note swelling about the knee. MRI of the right knee dated 02/12/2021 is reviewed and shows medial compartment with mild partial-thickness cartilage loss.  Lateral compartment with extensive cartilage loss.  Lateral, apartment with severe subchondral edema involving the medial femoral condyle and to a lesser degree involving the tibial plateau.  Review of Systems See HPI  Objective: Vital Signs: There were no vitals taken for this visit.  Physical Exam Constitutional:      Appearance: She is not ill-appearing or diaphoretic.  Pulmonary:     Effort: Pulmonary effort is normal.  Neurological:     Mental Status: She is alert and oriented to person, place, and time.  Psychiatric:        Mood and Affect: Mood normal.  Ortho Exam Left knee good range of motion without pain.  Left knee with well-healed surgical scar over the midline and patella.  Right knee positive effusion.  Tenderness along medial lateral joint line.  No gross instability valgus varus stressing.  Good range of motion of the right knee.  Right calf supple nontender. Specialty Comments:  No specialty comments available.  Imaging: No results found.   PMFS History: Patient Active Problem List   Diagnosis Date Noted  . Effusion of right knee 02/03/2021  . Acquired trigger finger of right ring finger 10/26/2020  . Acute diverticulitis 09/30/2019  . E coli bacteremia 09/30/2019  . Thrombocytopenia ([facility]) 09/30/2019  .  AKI (acute kidney injury) ([facility]) 09/30/2019  . LFT elevation 09/30/2019  . Bacteriuria 08/11/2018  . Hydronephrosis, left 08/11/2018  . Diverticulitis 08/10/2018  . Colonic diverticular abscess 08/10/2018  . Lupus ([facility]) 08/10/2018  . Hyperlipidemia 08/10/2018  . HTN (hypertension) 08/10/2018  . Anxiety 08/10/2018   Past Medical History:  Diagnosis Date  . Allergy   . Arthritis   . Asthma   . Diverticulitis   . Hyperlipidemia   . Lupus ([facility])   . Panic attacks   . Seasonal allergies     Family History  Problem Relation Age of Onset  . Colon cancer Neg Hx   . Stomach cancer Neg Hx   . Esophageal cancer Neg Hx   . Rectal cancer Neg Hx     Past Surgical History:  Procedure Laterality Date  . PATELLA FRACTURE SURGERY Left   . scar tissue eye  1983   right  . TUBAL LIGATION  1985  . WRIST FRACTURE SURGERY Right    Social History   Occupational History  . Not on file  Tobacco Use  . Smoking status: Current Every Day Smoker    Packs/day: 0.30    Types: Cigarettes  . Smokeless tobacco: Never Used  . Tobacco comment: Trying to quit  Vaping Use  . Vaping Use: Never used  Substance and Sexual Activity  . Alcohol use: No  . Drug use: No  . Sexual activity: Not on file

## 2021-02-24 ENCOUNTER — Telehealth: Payer: Self-pay

## 2021-02-24 NOTE — Telephone Encounter (Signed)
Noted  

## 2021-02-24 NOTE — Telephone Encounter (Signed)
Please get auth for right knee gel injection-[email or web address]

## 2021-03-23 ENCOUNTER — Telehealth: Payer: Self-pay | Admitting: Orthopaedic Surgery

## 2021-03-23 ENCOUNTER — Telehealth: Payer: Self-pay

## 2021-03-23 NOTE — Telephone Encounter (Signed)
Talked with patient concerning gel injection.  Patient really wants to know if she needs surgery and if so, she would rather do surgery then have a gel injection.  Please advise.  Thank you.

## 2021-03-23 NOTE — Telephone Encounter (Signed)
VOB has been submitted for SynviscOne, right knee. Pending BV

## 2021-03-23 NOTE — Telephone Encounter (Signed)
Patient called advised she thought she was being set up for surgery after she got the gel injection. Patient thought the gel injection was going to be given prior to her having surgery. Patient said she has been out of work for 6 months.  The number to contact patient is [phone]

## 2021-03-24 NOTE — Telephone Encounter (Signed)
I called and talked to the pt and she doesn't want to waste anymore time for her recovery by doing the injections. She wants to proceed with surgery. I scheduled her a followup to come back in to discuss this further with Dr. Ninfa Linden

## 2021-03-25 ENCOUNTER — Telehealth: Payer: Self-pay

## 2021-03-25 NOTE — Telephone Encounter (Signed)
Started PA with [employer] for [place], right knee. Per Effence F.tracking number is [id number] PA Currently Pending.

## 2021-04-01 ENCOUNTER — Telehealth: Payer: Self-pay

## 2021-04-01 NOTE — Telephone Encounter (Signed)
Received a call from [facility] concerning PA for SynviscOne, right knee. Advised rep.,with additional information for gel injection.  Per rep., PA will be sent over to the clinic team to be reviewed. PA currently Pending Tracking# [id number]

## 2021-04-04 ENCOUNTER — Encounter: Payer: Self-pay | Admitting: Orthopaedic Surgery

## 2021-04-04 ENCOUNTER — Ambulatory Visit (INDEPENDENT_AMBULATORY_CARE_PROVIDER_SITE_OTHER): Payer: BC Managed Care – PPO | Admitting: Orthopaedic Surgery

## 2021-04-04 VITALS — Ht 62.5 in | Wt 158.0 lb

## 2021-04-04 DIAGNOSIS — M1711 Unilateral primary osteoarthritis, right knee: Secondary | ICD-10-CM

## 2021-04-04 NOTE — Progress Notes (Signed)
Office Visit Note   Patient: Brittany Hobbs           Date of Birth: 24-Mar-1961           MRN: [id number] Visit Date: 04/04/2021              Requested by: Berkley Harvey, NP [place],  [place] [address] PCP: Berkley Harvey, NP   Assessment & Plan: Visit Diagnoses:  1. Primary osteoarthritis of right knee     Plan: Given the failed conservative treatment with her right knee we are recommending a knee replacement.  I showed her knee replacement model and explained in detail what this type of surgery involves.  She is wanting to proceed with this as well.  I had a long and thorough discussion about the interoperative and postoperative course.  We talked about the risk and benefits of surgery as well.  She has been on hydrocodone and states that her primary care physician wants me to provide her narcotic pain medication.  I am reluctant to do so until after surgery.  I do not usually treat arthritic pain with narcotics prior to surgery.  She understands this as well.  All questions and concerns were answered and addressed.  We will work on setting her up for surgery.  Follow-Up Instructions: Return for 2 weeks post-op.   Orders:  No orders of the defined types were placed in this encounter.  No orders of the defined types were placed in this encounter.     Procedures: No procedures performed   Clinical Data: No additional findings.   Subjective: Chief Complaint  Patient presents with   Right Knee - Pain  The patient comes in today to discuss right knee replacement surgery.  She has x-rays and MRI showing full-thickness cartilage loss mainly the lateral compartment of her knee with valgus malalignment.  She is already failed at least 2 steroid injections in her right knee.  She has been through quad strengthening exercises and failed conservative treatment for well over 6 months to almost a year now.  She wanted to consider hyaluronic acid injection  for her right knee but then decided she would rather go through knee replacement surgery due to her recurrent effusions combined with her MRI findings and due to the debilitating pain she is having with her right knee.  At this point it is 10 out of 10 daily and it is detrimentally affecting her mobility, her quality of life, and her actives daily living.  HPI  Review of Systems She currently denies any headache, chest pain, shortness of breath, fever, chills, nausea, vomiting   Objective: Vital Signs: Ht 5' 2.5" (1.588 m)   Wt 158 lb (71.7 kg)   BMI 28.44 kg/m   Physical Exam She is alert and oriented x3 and in no acute distress Ortho Exam Examination of her right knee shows valgus malalignment.  There is a moderate effusion as well.  There is patellofemoral crepitation and significant lateral joint line tenderness. Specialty Comments:  No specialty comments available.  Imaging: No results found. Previous x-rays and MRI shows subchondral edema of the right knee all around the lateral compartment.  There is extensive full-thickness cartilage loss of the lateral compartment of her knee as well as a macerated lateral meniscus.  There is also significant patellofemoral arthritic changes.  PMFS History: Patient Active Problem List   Diagnosis Date Noted   Effusion of right knee 02/03/2021  Acquired trigger finger of right ring finger 10/26/2020   Acute diverticulitis 09/30/2019   E coli bacteremia 09/30/2019   Thrombocytopenia ([place]) 09/30/2019   AKI (acute kidney injury) ([place]) 09/30/2019   LFT elevation 09/30/2019   Bacteriuria 08/11/2018   Hydronephrosis, left 08/11/2018   Diverticulitis 08/10/2018   Colonic diverticular abscess 08/10/2018   Lupus ([place]) 08/10/2018   Hyperlipidemia 08/10/2018   HTN (hypertension) 08/10/2018   Anxiety 08/10/2018   Past Medical History:  Diagnosis Date   Allergy    Arthritis    Asthma    Diverticulitis    Hyperlipidemia    Lupus ([place])     Panic attacks    Seasonal allergies     Family History  Problem Relation Age of Onset   Colon cancer Neg Hx    Stomach cancer Neg Hx    Esophageal cancer Neg Hx    Rectal cancer Neg Hx     Past Surgical History:  Procedure Laterality Date   PATELLA FRACTURE SURGERY Left    scar tissue eye  1983   right   TUBAL LIGATION  1985   WRIST FRACTURE SURGERY Right    Social History   Occupational History   Not on file  Tobacco Use   Smoking status: Every Day    Packs/day: 0.30    Types: Cigarettes   Smokeless tobacco: Never   Tobacco comments:    Trying to quit  Vaping Use   Vaping Use: Never used  Substance and Sexual Activity   Alcohol use: No   Drug use: No   Sexual activity: Not on file

## 2021-04-08 ENCOUNTER — Ambulatory Visit: Payer: BC Managed Care – PPO | Admitting: Neurology

## 2021-04-08 ENCOUNTER — Telehealth: Payer: Self-pay

## 2021-04-08 NOTE — Telephone Encounter (Signed)
Talked with Hobbs and was advised that she will be having surgery.  Approved for SynviscOne, right knee. Brittany Hobbs will be responsible for 50% OOP. No Co-pay PA Approval# [id number] Valid 03/25/2021- 09/20/2021

## 2021-04-11 NOTE — Progress Notes (Deleted)
Brittany Hobbs Phone: [phone] Subjective:    I'm seeing this patient by the request  of:  Berkley Harvey, NP  CC: Trigger finger and knee pain follow-up  [id number]  02/03/2021 Patient does have an effusion of the right knee noted today.  There is even a synovitis noted on ultrasound.  Unable to save picture secondary to difficulty.  Patient has had a Doppler that did rule out any type of clot.  Patient states that she has had an injection within the last week from her rheumatologist.  Unfortunately I do not have this note at the moment.  Discussed with her we could consider the possible aspiration but patient declined that as well as declined.  Laboratory work-up at this time with patient still having an outstanding bill she states from her other physicians.  We will get an MRI of the knee and the tib-fib to rule out any type of intra-articular difficulty as well as rule out any type of osteomyelitis that could be contributing.  Patient is having significant amount of pain she states but is able to move the knee relatively well.  Patient does have pain medication from her primary care from her and discussed with her that I would consider more of the topical anti-inflammatories or the possibility of injections.  Depending on MRI hopefully will get this done soon and discuss further treatment.  We discussed with patient she is covered with the clindamycin but if any redness, increased discomfort and pain, any shortness of breath to seek medical attention immediately.  We will discussed with patient after imaging.  Total time with patient and reviewing  Update 04/12/2021 Brittany Hobbs is a 60 y.o. female coming in with complaint of R knee pain. Patient states   MRI R knee 02/12/2021 IMPRESSION: 1. Maceration of the anterior horn and body of the lateral meniscus. Degeneration of the posterior horn of the lateral meniscus  with irregularity along the inferior surface. 2. Extensive full-thickness cartilage loss of the lateral femorotibial compartment with subchondral reactive marrow edema. 3. Severe subchondral marrow edema in the lateral femoral condyle disproportionate to the degree of overlying cartilage loss may reflect an element of stress reaction. 4. Large joint effusion. 5. Ruptured Baker's cyst.  MRI R ankle 02/12/2021 IMPRESSION: 1. No internal derangement of right ankle. 2. Soft tissue edema throughout the left lower leg and around the ankle.  MRI R tib/fib 02/12/2021 IMPRESSION: 1. Small simple fluid collection along the superficial aspect of the medial gastrocnemius muscle posteriorly likely result of the ruptured Baker's cyst. Fluid signal is seen between the medial gastrocnemius muscle and soleus muscle which may be a result of the ruptured Baker's cyst, but a plantaris tear cannot be excluded. 2 cm hematoma along the anterior peripheral inferior aspect of the medial gastrocnemius muscle . 2. Ruptured Baker's cyst. 3. Large knee joint effusion.   Past trigger finger injection of the right ring finger was February 2022    Past Medical History:  Diagnosis Date   Allergy    Arthritis    Asthma    Diverticulitis    Hyperlipidemia    Lupus ([facility])    Panic attacks    Seasonal allergies    Past Surgical History:  Procedure Laterality Date   PATELLA FRACTURE SURGERY Left    scar tissue eye  1983   right   TUBAL LIGATION  1985   WRIST FRACTURE SURGERY Right  Social History   Socioeconomic History   Marital status: Widowed    Spouse name: Not on file   Number of children: Not on file   Years of education: Not on file   Highest education level: Not on file  Occupational History   Not on file  Tobacco Use   Smoking status: Every Day    Packs/day: 0.30    Types: Cigarettes   Smokeless tobacco: Never   Tobacco comments:    Trying to quit  Vaping Use   Vaping Use: Never  used  Substance and Sexual Activity   Alcohol use: No   Drug use: No   Sexual activity: Not on file  Other Topics Concern   Not on file  Social History Narrative   Not on file   Social Determinants of Health   Financial Resource Strain: Not on file  Food Insecurity: Not on file  Transportation Needs: Not on file  Physical Activity: Not on file  Stress: Not on file  Social Connections: Not on file   Allergies  Allergen Reactions   Iodine Anaphylaxis    Pt states she is allergic to INTERNAL IODINE   Betadine [Povidone Iodine] Hives   Methocarbamol Nausea Only    Felt nauseated and sick   Sulfa Antibiotics     N/V   Family History  Problem Relation Age of Onset   Colon cancer Neg Hx    Stomach cancer Neg Hx    Esophageal cancer Neg Hx    Rectal cancer Neg Hx     Current Outpatient Medications (Endocrine & Metabolic):    predniSONE (STERAPRED UNI-PAK 21 TAB) 10 MG (21) TBPK tablet, Take by mouth as directed.    Current Outpatient Medications (Analgesics):    aspirin EC 81 MG tablet, Take 81 mg by mouth daily.   HYDROcodone-acetaminophen (NORCO) 10-325 MG tablet, Take 1 tablet by mouth every 6 (six) hours as needed.   Current Outpatient Medications (Other):    escitalopram (LEXAPRO) 20 MG tablet, Take 20 mg by mouth daily.   gabapentin (NEURONTIN) 400 MG capsule, Take 400 mg by mouth 3 (three) times daily.    LORazepam (ATIVAN) 0.5 MG tablet, Take 0.5-1 mg by mouth See admin instructions. 0.'5mg'$  in AM and '1mg'$  in evening   Reviewed prior external information including notes and imaging from  primary care provider As well as notes that were available from care everywhere and other healthcare systems.  Past medical history, social, surgical and family history all reviewed in electronic medical record.  No pertanent information unless stated regarding to the chief complaint.   Review of Systems:  No headache, visual changes, nausea, vomiting, diarrhea, constipation,  dizziness, abdominal pain, skin rash, fevers, chills, night sweats, weight loss, swollen lymph nodes, body aches, joint swelling, chest pain, shortness of breath, mood changes. POSITIVE muscle aches  Objective  There were no vitals taken for this visit.   General: No apparent distress alert and oriented x3 mood and affect normal, dressed appropriately.  HEENT: Pupils equal, extraocular movements intact  Respiratory: Patient's speak in full sentences and does not appear short of breath  Cardiovascular: No lower extremity edema, non tender, no erythema  Gait normal with good balance and coordination.  MSK:      Impression and Recommendations:     The above documentation has been reviewed and is accurate and complete Lyndal Pulley, DO

## 2021-04-12 ENCOUNTER — Ambulatory Visit: Payer: BC Managed Care – PPO | Admitting: Family Medicine

## 2021-05-03 NOTE — Progress Notes (Deleted)
NEUROLOGY CONSULTATION NOTE  Brittany Hobbs MRN: [id number] DOB: 1961-04-01  Referring provider: Eldridge Abrahams, NP Primary care provider: Eldridge Abrahams, NP  Reason for consult:  concussion  Assessment/Plan:   ***   Subjective:  Brittany Hobbs is a 60 year old ***-handed female with ADHD, panic attacks and discoid lupus who presents for concussion.  History supplemented by PCP notes.  On 10/16/2020, she got up during the night to *** and fell, striking the right side of her head and eye.  She had some nausea but overall felt fine and didn't seek medical attention.  Then next evening, she was driving home from her boyfriend's house when she became disoriented and lost.  She had to call her friend to come pick her up.  She saw her PCP the next day who recommended going to the ED but patient refused.  ***  She had a CT of the head on 10/26/2020 which showed no acute intracranial abnormality.  TSH from April was 2.03 and B12 was 285.  ***     PAST MEDICAL HISTORY: Past Medical History:  Diagnosis Date   Allergy    Arthritis    Asthma    Diverticulitis    Hyperlipidemia    Lupus ([place])    Panic attacks    Seasonal allergies     PAST SURGICAL HISTORY: Past Surgical History:  Procedure Laterality Date   PATELLA FRACTURE SURGERY Left    scar tissue eye  1983   right   TUBAL LIGATION  1985   WRIST FRACTURE SURGERY Right     MEDICATIONS: Current Outpatient Medications on File Prior to Visit  Medication Sig Dispense Refill   aspirin EC 81 MG tablet Take 81 mg by mouth daily.     escitalopram (LEXAPRO) 20 MG tablet Take 20 mg by mouth daily.     gabapentin (NEURONTIN) 400 MG capsule Take 400 mg by mouth 3 (three) times daily.      HYDROcodone-acetaminophen (NORCO) 10-325 MG tablet Take 1 tablet by mouth every 6 (six) hours as needed.     LORazepam (ATIVAN) 0.5 MG tablet Take 0.5-1 mg by mouth See admin instructions. 0.'5mg'$  in AM and '1mg'$  in evening     predniSONE (STERAPRED  UNI-PAK 21 TAB) 10 MG (21) TBPK tablet Take by mouth as directed.     No current facility-administered medications on file prior to visit.    ALLERGIES: Allergies  Allergen Reactions   Iodine Anaphylaxis    Pt states she is allergic to INTERNAL IODINE   Betadine [Povidone Iodine] Hives   Methocarbamol Nausea Only    Felt nauseated and sick   Sulfa Antibiotics     N/V    FAMILY HISTORY: Family History  Problem Relation Age of Onset   Colon cancer Neg Hx    Stomach cancer Neg Hx    Esophageal cancer Neg Hx    Rectal cancer Neg Hx     Objective:  *** General: No acute distress.  Patient appears well-groomed.   Head:  Normocephalic/atraumatic Eyes:  fundi examined but not visualized Neck: supple, no paraspinal tenderness, full range of motion Back: No paraspinal tenderness Heart: regular rate and rhythm Lungs: Clear to auscultation bilaterally. Vascular: No carotid bruits. Neurological Exam: Mental status: alert and oriented to person, place, and time, recent and remote memory intact, fund of knowledge intact, attention and concentration intact, speech fluent and not dysarthric, language intact. Cranial nerves: CN I: not tested CN II: pupils equal, round  and reactive to light, visual fields intact CN III, IV, VI:  full range of motion, no nystagmus, no ptosis CN V: facial sensation intact. CN VII: upper and lower face symmetric CN VIII: hearing intact CN IX, X: gag intact, uvula midline CN XI: sternocleidomastoid and trapezius muscles intact CN XII: tongue midline Bulk & Tone: normal, no fasciculations. Motor:  muscle strength 5/5 throughout Sensation:  Pinprick, temperature and vibratory sensation intact. Deep Tendon Reflexes:  2+ throughout,  toes downgoing.   Finger to nose testing:  Without dysmetria.   Heel to shin:  Without dysmetria.   Gait:  Normal station and stride.  Romberg negative.    Thank you for allowing me to take part in the care of this  patient.  Metta Clines, DO  CC: Eldridge Abrahams, NP

## 2021-05-04 ENCOUNTER — Ambulatory Visit: Payer: Self-pay | Admitting: Neurology

## 2021-05-07 ENCOUNTER — Other Ambulatory Visit: Payer: Self-pay | Admitting: Physician Assistant

## 2021-05-11 NOTE — Patient Instructions (Signed)
DUE TO COVID-19 ONLY ONE VISITOR IS ALLOWED TO COME WITH YOU AND STAY IN THE WAITING ROOM ONLY DURING PRE OP AND PROCEDURE DAY OF SURGERY. THE 2 VISITORS  MAY VISIT WITH YOU AFTER SURGERY IN YOUR PRIVATE ROOM DURING VISITING HOURS ONLY!                Brittany Hobbs     Your procedure is scheduled on: 05/20/21   Report to [facility] Main  Entrance   Report to short stay at 5:15 AM     Call this number if you have problems the morning of surgery [place], [place].   No food after midnight.    You may have clear liquid until 4:30 AM.    At 4:00 AM drink pre surgery drink.   Nothing by mouth after 4:30 AM.    Take these medicines the morning of surgery with A SIP OF WATER: Gabapentin, Escitalopram, Prednisone.  Use your inhaler and bring it with you                                You may not have any metal on your body including hair pins and              piercings  Do not wear jewelry, make-up, lotions, powders or perfumes, deodorant             Do not wear nail polish on your fingernails.  Do not shave  48 hours prior to surgery.                Do not bring valuables to the hospital. [place].  Contacts, dentures or bridgework may not be worn into surgery.        Special Instructions: N/A              Please read over the following fact sheets you were given: _____________________________________________________________________ [facility]- Preparing for Total Shoulder Arthroplasty    Before surgery, you can play an important role. Because skin is not sterile, your skin needs to be as free of germs as possible. You can reduce the number of germs on your skin by using the following products. Benzoyl Peroxide Gel Reduces the number of germs present on the skin Applied twice a day to shoulder area starting two days before  surgery    ==================================================================  Please follow these instructions carefully:  BENZOYL PEROXIDE 5% GEL  Please do not use if you have an allergy to benzoyl peroxide.   If your skin becomes reddened/irritated stop using the benzoyl peroxide.  Starting two days before surgery, apply as follows: Apply benzoyl peroxide in the morning and at night. Apply after taking a shower. If you are not taking a shower clean entire shoulder front, back, and side along with the armpit with a clean wet washcloth.  Place a quarter-sized dollop on your shoulder and rub in thoroughly, making sure to cover the front, back, and side of your shoulder, along with the armpit.   2 days before ____ AM   ____ PM              1 day before ____ AM   ____ PM  Do this twice a day for two days.  (Last application is the night before surgery, AFTER using the CHG soap as described below).  Do NOT apply benzoyl peroxide gel on the day of surgery.             [place] - Preparing for Surgery Before surgery, you can play an important role.  Because skin is not sterile, your skin needs to be as free of germs as possible.  You can reduce the number of germs on your skin by washing with CHG (chlorahexidine gluconate) soap before surgery.  CHG is an antiseptic cleaner which kills germs and bonds with the skin to continue killing germs even after washing. Please DO NOT use if you have an allergy to CHG or antibacterial soaps.  If your skin becomes reddened/irritated stop using the CHG and inform your nurse when you arrive at Short Stay. Do not shave (including legs and underarms) for at least 48 hours prior to the first CHG shower.   Please follow these instructions carefully:  1.  Shower with CHG Soap the night before surgery and the  morning of Surgery.  2.  If you choose to wash your hair, wash your hair first as usual with your  normal  shampoo.  3.  After  you shampoo, rinse your hair and body thoroughly to remove the  shampoo.                            4.  Use CHG as you would any other liquid soap.  You can apply chg directly  to the skin and wash                       Gently with a scrungie or clean washcloth.  5.  Apply the CHG Soap to your body ONLY FROM THE NECK DOWN.   Do not use on face/ open                           Wound or open sores. Avoid contact with eyes, ears mouth and genitals (private parts).                       Wash face,  Genitals (private parts) with your normal soap.             6.  Wash thoroughly, paying special attention to the area where your surgery  will be performed.  7.  Thoroughly rinse your body with warm water from the neck down.  8.  DO NOT shower/wash with your normal soap after using and rinsing off  the CHG Soap.             9.  Pat yourself dry with a clean towel.            10.  Wear clean pajamas.            11.  Place clean sheets on your bed the night of your first shower and do not  sleep with pets. Day of Surgery : Do not apply any lotions/deodorants the morning of surgery.  Please wear clean clothes to the hospital/surgery center.  FAILURE TO FOLLOW THESE INSTRUCTIONS MAY RESULT IN THE CANCELLATION OF YOUR SURGERY PATIENT SIGNATURE_________________________________  NURSE SIGNATURE__________________________________  ________________________________________________________________________

## 2021-05-12 ENCOUNTER — Encounter (HOSPITAL_COMMUNITY)
Admission: RE | Admit: 2021-05-12 | Discharge: 2021-05-12 | Disposition: A | Discharge status: Home / Self Care | Payer: BC Managed Care – PPO | Source: Ambulatory Visit | Attending: Orthopaedic Surgery | Admitting: Orthopaedic Surgery

## 2021-05-12 ENCOUNTER — Other Ambulatory Visit: Payer: Self-pay

## 2021-05-12 ENCOUNTER — Encounter (HOSPITAL_COMMUNITY): Payer: Self-pay

## 2021-05-12 DIAGNOSIS — Z01818 Encounter for other preprocedural examination: Secondary | ICD-10-CM | POA: Insufficient documentation

## 2021-05-12 HISTORY — DX: Other specified postprocedural states: Z98.890

## 2021-05-12 HISTORY — DX: Nausea with vomiting, unspecified: R11.2

## 2021-05-12 LAB — CBC
HCT: 45.5 % (ref 36.0–46.0)
Hemoglobin: 14.7 g/dL (ref 12.0–15.0)
MCH: 28.5 pg (ref 26.0–34.0)
MCHC: 32.3 g/dL (ref 30.0–36.0)
MCV: 88.2 fL (ref 80.0–100.0)
Platelets: 287 10*3/uL (ref 150–400)
RBC: 5.16 MIL/uL — ABNORMAL HIGH (ref 3.87–5.11)
RDW: 15.4 % (ref 11.5–15.5)
WBC: 11.1 10*3/uL — ABNORMAL HIGH (ref 4.0–10.5)
nRBC: 0 % (ref 0.0–0.2)

## 2021-05-12 LAB — BASIC METABOLIC PANEL
Anion gap: 9 (ref 5–15)
BUN: 28 mg/dL — ABNORMAL HIGH (ref 6–20)
CO2: 27 mmol/L (ref 22–32)
Calcium: 9.6 mg/dL (ref 8.9–10.3)
Chloride: 104 mmol/L (ref 98–111)
Creatinine, Ser: 0.64 mg/dL (ref 0.44–1.00)
GFR, Estimated: >60 mL/min (ref >60)
Glucose, Bld: 109 mg/dL — ABNORMAL HIGH (ref 70–99)
Potassium: 3.9 mmol/L (ref 3.5–5.1)
Sodium: 140 mmol/L (ref 135–145)

## 2021-05-12 LAB — SURGICAL PCR SCREEN
MRSA, PCR: NEGATIVE
Staphylococcus aureus: NEGATIVE

## 2021-05-12 NOTE — Progress Notes (Signed)
COVID test Completed:05/18/21   PCP - Dr. Eldridge Abrahams PA Cardiologist - none  Chest x-ray - no EKG - 8.25.22-chart Stress Test - no ECHO - no Cardiac Cath - no Pacemaker/ICD device last checked:NA  Sleep Study - no CPAP -   Fasting Blood Sugar - NA Checks Blood Sugar _____ times a day  Blood Thinner Instructions:NA Aspirin Instructions: Last Dose:  Anesthesia review: yes  Patient denies shortness of breath, fever, cough and chest pain at PAT appointment Yes. Pt fell down some stairs this week and had bruises on Rt lower arm and Lt upper arm.She has a scrape on her head with a bandage. She wears wigs because of hair loss and lupus. I told her she needs to call Dr. Ninfa Linden and let him know. She uses an inhaler for asthma that is mostly triggered by heat and humidity. She denies SOB with ADLs.   Patient verbalized understanding of instructions that were given to them at the PAT appointment. Patient was also instructed that they will need to review over the PAT instructions again at home before surgery. yes

## 2021-05-13 ENCOUNTER — Other Ambulatory Visit: Payer: Self-pay

## 2021-05-17 ENCOUNTER — Other Ambulatory Visit: Payer: Self-pay | Admitting: Orthopaedic Surgery

## 2021-05-18 LAB — SARS CORONAVIRUS 2 (TAT 6-24 HRS): SARS Coronavirus 2: NEGATIVE

## 2021-05-18 NOTE — Progress Notes (Deleted)
[place] Phone: [phone] Subjective:    I'm seeing this patient by the request  of:  Berkley Harvey, NP  CC: Right hand pain  [id number]  Brittany Hobbs is a 60 y.o. female coming in with complaint of right hand pain. Would like injection. ***  Onset-  Location Duration-  Character- Aggravating factors- Reliving factors-  Therapies tried-  Severity-     Past Medical History:  Diagnosis Date   Arthritis    Asthma    Diverticulitis    Hyperlipidemia    Lupus ([place])    Panic attacks    Pneumonia 09/2020   with covid   PONV (postoperative nausea and vomiting)    Seasonal allergies    Past Surgical History:  Procedure Laterality Date   PATELLA FRACTURE SURGERY Left    scar tissue eye  1983   right   TUBAL LIGATION  1985   WRIST FRACTURE SURGERY Right    Social History   Socioeconomic History   Marital status: Widowed    Spouse name: Not on file   Number of children: Not on file   Years of education: Not on file   Highest education level: Not on file  Occupational History   Not on file  Tobacco Use   Smoking status: Every Day    Packs/day: 0.30    Years: 30.00    Pack years: 9.00    Types: Cigarettes   Smokeless tobacco: Never   Tobacco comments:    Trying to quit  Vaping Use   Vaping Use: Never used  Substance and Sexual Activity   Alcohol use: No   Drug use: Yes    Frequency: 7.0 times per week    Types: Marijuana    Comment: CBD   Sexual activity: Not on file  Other Topics Concern   Not on file  Social History Narrative   Not on file   Social Determinants of Health   Financial Resource Strain: Not on file  Food Insecurity: Not on file  Transportation Needs: Not on file  Physical Activity: Not on file  Stress: Not on file  Social Connections: Not on file   Allergies  Allergen Reactions   Iodine Anaphylaxis    Pt states she is allergic to INTERNAL  IODINE   Betadine [Povidone Iodine] Hives   Methocarbamol Nausea Only    Felt nauseated and sick   Sulfa Antibiotics     N/V   Family History  Problem Relation Age of Onset   Colon cancer Neg Hx    Stomach cancer Neg Hx    Esophageal cancer Neg Hx    Rectal cancer Neg Hx     Current Outpatient Medications (Endocrine & Metabolic):    predniSONE (DELTASONE) 10 MG tablet, Take 10 mg by mouth daily with breakfast.   Current Outpatient Medications (Respiratory):    albuterol (VENTOLIN HFA) 108 (90 Base) MCG/ACT inhaler, Inhale 2 puffs into the lungs every 6 (six) hours as needed for wheezing or shortness of breath.  Current Outpatient Medications (Analgesics):    aspirin EC 81 MG tablet, Take 81 mg by mouth daily.   HYDROcodone-acetaminophen (NORCO/VICODIN) 5-325 MG tablet, Take 1 tablet by mouth every 6 (six) hours as needed for moderate pain.   Current Outpatient Medications (Other):    escitalopram (LEXAPRO) 20 MG tablet, Take 20 mg by mouth daily.   gabapentin (NEURONTIN) 400 MG capsule, Take 800 mg by  mouth 3 (three) times daily.   LORazepam (ATIVAN) 0.5 MG tablet, Take 0.5-1 mg by mouth See admin instructions. 0.'5mg'$  in AM and '1mg'$  in evening   tacrolimus (PROTOPIC) 0.03 % ointment, Apply 1 application topically 2 (two) times daily.   Reviewed prior external information including notes and imaging from  primary care provider As well as notes that were available from care everywhere and other healthcare systems.  Past medical history, social, surgical and family history all reviewed in electronic medical record.  No pertanent information unless stated regarding to the chief complaint.   Review of Systems:  No headache, visual changes, nausea, vomiting, diarrhea, constipation, dizziness, abdominal pain, skin rash, fevers, chills, night sweats, weight loss, swollen lymph nodes, body aches, joint swelling, chest pain, shortness of breath, mood changes. POSITIVE muscle  aches  Objective  There were no vitals taken for this visit.   General: No apparent distress alert and oriented x3 mood and affect normal, dressed appropriately.  HEENT: Pupils equal, extraocular movements intact  Respiratory: Patient's speak in full sentences and does not appear short of breath  Cardiovascular: No lower extremity edema, non tender, no erythema  Gait normal with good balance and coordination.  MSK:  Non tender with full range of motion and good stability and symmetric strength and tone of shoulders, elbows, wrist, hip, knee and ankles bilaterally.     Impression and Recommendations:     The above documentation has been reviewed and is accurate and complete Brittany Pulley, DO

## 2021-05-19 ENCOUNTER — Ambulatory Visit: Payer: BC Managed Care – PPO | Admitting: Family Medicine

## 2021-05-19 DIAGNOSIS — M1711 Unilateral primary osteoarthritis, right knee: Secondary | ICD-10-CM

## 2021-05-19 NOTE — Anesthesia Preprocedure Evaluation (Addendum)
Anesthesia Evaluation  Patient identified by MRN, date of birth, ID band Patient awake    Reviewed: Allergy & Precautions, NPO status , Patient's Chart, lab work & pertinent test results  History of Anesthesia Complications (+) PONV and history of anesthetic complications  Airway Mallampati: II  TM Distance: >3 FB Neck ROM: Full    Dental  (+) Dental Advisory Given, Lower Dentures, Upper Dentures   Pulmonary asthma , Current SmokerPatient did not abstain from smoking.,    Pulmonary exam normal breath sounds clear to auscultation       Cardiovascular hypertension, (-) angina(-) CAD and (-) Past MI Normal cardiovascular exam Rhythm:Regular Rate:Normal     Neuro/Psych PSYCHIATRIC DISORDERS Anxiety negative neurological ROS     GI/Hepatic negative GI ROS, Neg liver ROS,   Endo/Other  negative endocrine ROSLupus  Renal/GU negative Renal ROS     Musculoskeletal  (+) Arthritis , Osteoarthritis,    Abdominal   Peds  Hematology negative hematology ROS (+) Plt 287k   Anesthesia Other Findings   Reproductive/Obstetrics                            Anesthesia Physical Anesthesia Plan  ASA: 2  Anesthesia Plan: Spinal   Post-op Pain Management:  Regional for Post-op pain   Induction: Intravenous  PONV Risk Score and Plan: 2 and Midazolam, Propofol infusion, Dexamethasone and Ondansetron  Airway Management Planned: Natural Airway and Nasal Cannula  Additional Equipment:   Intra-op Plan:   Post-operative Plan:   Informed Consent: I have reviewed the patients History and Physical, chart, labs and discussed the procedure including the risks, benefits and alternatives for the proposed anesthesia with the patient or authorized representative who has indicated his/her understanding and acceptance.     Dental advisory given  Plan Discussed with: CRNA  Anesthesia Plan Comments:         Anesthesia Quick Evaluation

## 2021-05-19 NOTE — H&P (Signed)
TOTAL KNEE ADMISSION H&P  Patient is being admitted for right total knee arthroplasty.  Subjective:  Chief Complaint:right knee pain.  HPI: Brittany Hobbs, 60 y.o. female, has a history of pain and functional disability in the right knee due to arthritis and has failed non-surgical conservative treatments for greater than 12 weeks to includeNSAID's and/or analgesics, corticosteriod injections, flexibility and strengthening excercises, and activity modification.  Onset of symptoms was abrupt, starting 1 years ago with rapidlly worsening course since that time. The patient noted no past surgery on the right knee(s).  Patient currently rates pain in the right knee(s) at 10 out of 10 with activity. Patient has night pain, worsening of pain with activity and weight bearing, pain that interferes with activities of daily living, pain with passive range of motion, crepitus, and joint swelling.  Patient has evidence of subchondral sclerosis, periarticular osteophytes, and joint space narrowing by imaging studies. There is no active infection.  Patient Active Problem List   Diagnosis Date Noted   Unilateral primary osteoarthritis, right knee 05/19/2021   Effusion of right knee 02/03/2021   Acquired trigger finger of right ring finger 10/26/2020   Acute diverticulitis 09/30/2019   E coli bacteremia 09/30/2019   Thrombocytopenia ([place]) 09/30/2019   AKI (acute kidney injury) ([place]) 09/30/2019   LFT elevation 09/30/2019   Bacteriuria 08/11/2018   Hydronephrosis, left 08/11/2018   Diverticulitis 08/10/2018   Colonic diverticular abscess 08/10/2018   Lupus ([place]) 08/10/2018   Hyperlipidemia 08/10/2018   HTN (hypertension) 08/10/2018   Anxiety 08/10/2018   Past Medical History:  Diagnosis Date   Arthritis    Asthma    Diverticulitis    Hyperlipidemia    Lupus ([place])    Panic attacks    Pneumonia 09/2020   with covid   PONV (postoperative nausea and vomiting)    Seasonal allergies     Past  Surgical History:  Procedure Laterality Date   PATELLA FRACTURE SURGERY Left    scar tissue eye  1983   right   [place] Right     No current facility-administered medications for this encounter.   Current Outpatient Medications  Medication Sig Dispense Refill Last Dose   albuterol (VENTOLIN HFA) 108 (90 Base) MCG/ACT inhaler Inhale 2 puffs into the lungs every 6 (six) hours as needed for wheezing or shortness of breath.      aspirin EC 81 MG tablet Take 81 mg by mouth daily.      escitalopram (LEXAPRO) 20 MG tablet Take 20 mg by mouth daily.      gabapentin (NEURONTIN) 400 MG capsule Take 800 mg by mouth 3 (three) times daily.      HYDROcodone-acetaminophen (NORCO/VICODIN) 5-325 MG tablet Take 1 tablet by mouth every 6 (six) hours as needed for moderate pain.      LORazepam (ATIVAN) 0.5 MG tablet Take 0.5-1 mg by mouth See admin instructions. 0.'5mg'$  in AM and '1mg'$  in evening      predniSONE (DELTASONE) 10 MG tablet Take 10 mg by mouth daily with breakfast.      tacrolimus (PROTOPIC) 0.03 % ointment Apply 1 application topically 2 (two) times daily.      Allergies  Allergen Reactions   Iodine Anaphylaxis    Pt states she is allergic to INTERNAL IODINE   Betadine [Povidone Iodine] Hives   Methocarbamol Nausea Only    Felt nauseated and sick   Sulfa Antibiotics     N/V  Social History   Tobacco Use   Smoking status: Every Day    Packs/day: 0.30    Years: 30.00    Pack years: 9.00    Types: Cigarettes   Smokeless tobacco: Never   Tobacco comments:    Trying to quit  Substance Use Topics   Alcohol use: No    Family History  Problem Relation Age of Onset   Colon cancer Neg Hx    Stomach cancer Neg Hx    Esophageal cancer Neg Hx    Rectal cancer Neg Hx      Review of Systems  Musculoskeletal:  Positive for joint swelling.  All other systems reviewed and are negative.  Objective:  Physical Exam Vitals reviewed.   Constitutional:      Appearance: Normal appearance.  HENT:     Head: Normocephalic and atraumatic.  Eyes:     Extraocular Movements: Extraocular movements intact.     Pupils: Pupils are equal, round, and reactive to light.  Cardiovascular:     Rate and Rhythm: Normal rate and regular rhythm.  Pulmonary:     Effort: Pulmonary effort is normal.     Breath sounds: Normal breath sounds.  Abdominal:     Palpations: Abdomen is soft.  Musculoskeletal:     Cervical back: Normal range of motion and neck supple.     Right knee: Effusion, bony tenderness and crepitus present. Decreased range of motion. Tenderness present over the medial joint line, lateral joint line and patellar tendon. Abnormal alignment and abnormal meniscus.  Neurological:     Mental Status: She is alert and oriented to person, place, and time.  Psychiatric:        Behavior: Behavior normal.    Vital signs in last 24 hours:    Labs:   Estimated body mass index is 25.75 kg/m as calculated from the following:   Height as of 05/12/21: '5\' 4"'$  (1.626 m).   Weight as of 05/12/21: 68 kg.   Imaging Review Plain radiographs demonstrate severe degenerative joint disease of the right knee(s). The overall alignment ismild valgus. The bone quality appears to be good for age and reported activity level.      Assessment/Plan:  End stage arthritis, right knee   The patient history, physical examination, clinical judgment of the provider and imaging studies are consistent with end stage degenerative joint disease of the right knee(s) and total knee arthroplasty is deemed medically necessary. The treatment options including medical management, injection therapy arthroscopy and arthroplasty were discussed at length. The risks and benefits of total knee arthroplasty were presented and reviewed. The risks due to aseptic loosening, infection, stiffness, patella tracking problems, thromboembolic complications and other imponderables  were discussed. The patient acknowledged the explanation, agreed to proceed with the plan and consent was signed. Patient is being admitted for inpatient treatment for surgery, pain control, PT, OT, prophylactic antibiotics, VTE prophylaxis, progressive ambulation and ADL's and discharge planning. The patient is planning to be discharged home with home health services

## 2021-05-20 ENCOUNTER — Other Ambulatory Visit: Payer: Self-pay

## 2021-05-20 ENCOUNTER — Encounter (HOSPITAL_COMMUNITY): Payer: Self-pay | Admitting: Orthopaedic Surgery

## 2021-05-20 ENCOUNTER — Ambulatory Visit (HOSPITAL_COMMUNITY): Payer: BC Managed Care – PPO | Admitting: Physician Assistant

## 2021-05-20 ENCOUNTER — Ambulatory Visit (HOSPITAL_COMMUNITY): Payer: BC Managed Care – PPO | Admitting: Anesthesiology

## 2021-05-20 ENCOUNTER — Observation Stay (HOSPITAL_COMMUNITY): Payer: BC Managed Care – PPO

## 2021-05-20 ENCOUNTER — Encounter (HOSPITAL_COMMUNITY)
Admission: RE | Disposition: A | Discharge status: Home / Self Care | Payer: Self-pay | Source: Ambulatory Visit | Attending: Orthopaedic Surgery

## 2021-05-20 ENCOUNTER — Observation Stay (HOSPITAL_COMMUNITY)
Admission: RE | Admit: 2021-05-20 | Discharge: 2021-05-21 | Disposition: A | Discharge status: Home / Self Care | Payer: BC Managed Care – PPO | Source: Ambulatory Visit | Attending: Orthopaedic Surgery | Admitting: Orthopaedic Surgery

## 2021-05-20 DIAGNOSIS — F17210 Nicotine dependence, cigarettes, uncomplicated: Secondary | ICD-10-CM | POA: Diagnosis not present

## 2021-05-20 DIAGNOSIS — I10 Essential (primary) hypertension: Secondary | ICD-10-CM | POA: Diagnosis not present

## 2021-05-20 DIAGNOSIS — Z79899 Other long term (current) drug therapy: Secondary | ICD-10-CM | POA: Diagnosis not present

## 2021-05-20 DIAGNOSIS — Z7982 Long term (current) use of aspirin: Secondary | ICD-10-CM | POA: Insufficient documentation

## 2021-05-20 DIAGNOSIS — Z96651 Presence of right artificial knee joint: Secondary | ICD-10-CM

## 2021-05-20 DIAGNOSIS — J45909 Unspecified asthma, uncomplicated: Secondary | ICD-10-CM | POA: Diagnosis not present

## 2021-05-20 DIAGNOSIS — M1711 Unilateral primary osteoarthritis, right knee: Principal | ICD-10-CM

## 2021-05-20 HISTORY — PX: TOTAL KNEE ARTHROPLASTY: SHX125

## 2021-05-20 LAB — TYPE AND SCREEN
ABO/RH(D): O POS
Antibody Screen: NEGATIVE

## 2021-05-20 LAB — ABO/RH: ABO/RH(D): O POS

## 2021-05-20 IMAGING — DX DG KNEE 1-2V PORT*R*
2 series · 2 of 2 positions shown · non-contrast
Comparison: [DATE]

CLINICAL DATA: Postop knee replacement.

EXAM:
PORTABLE RIGHT KNEE - 1-2 VIEW

[knee ap]
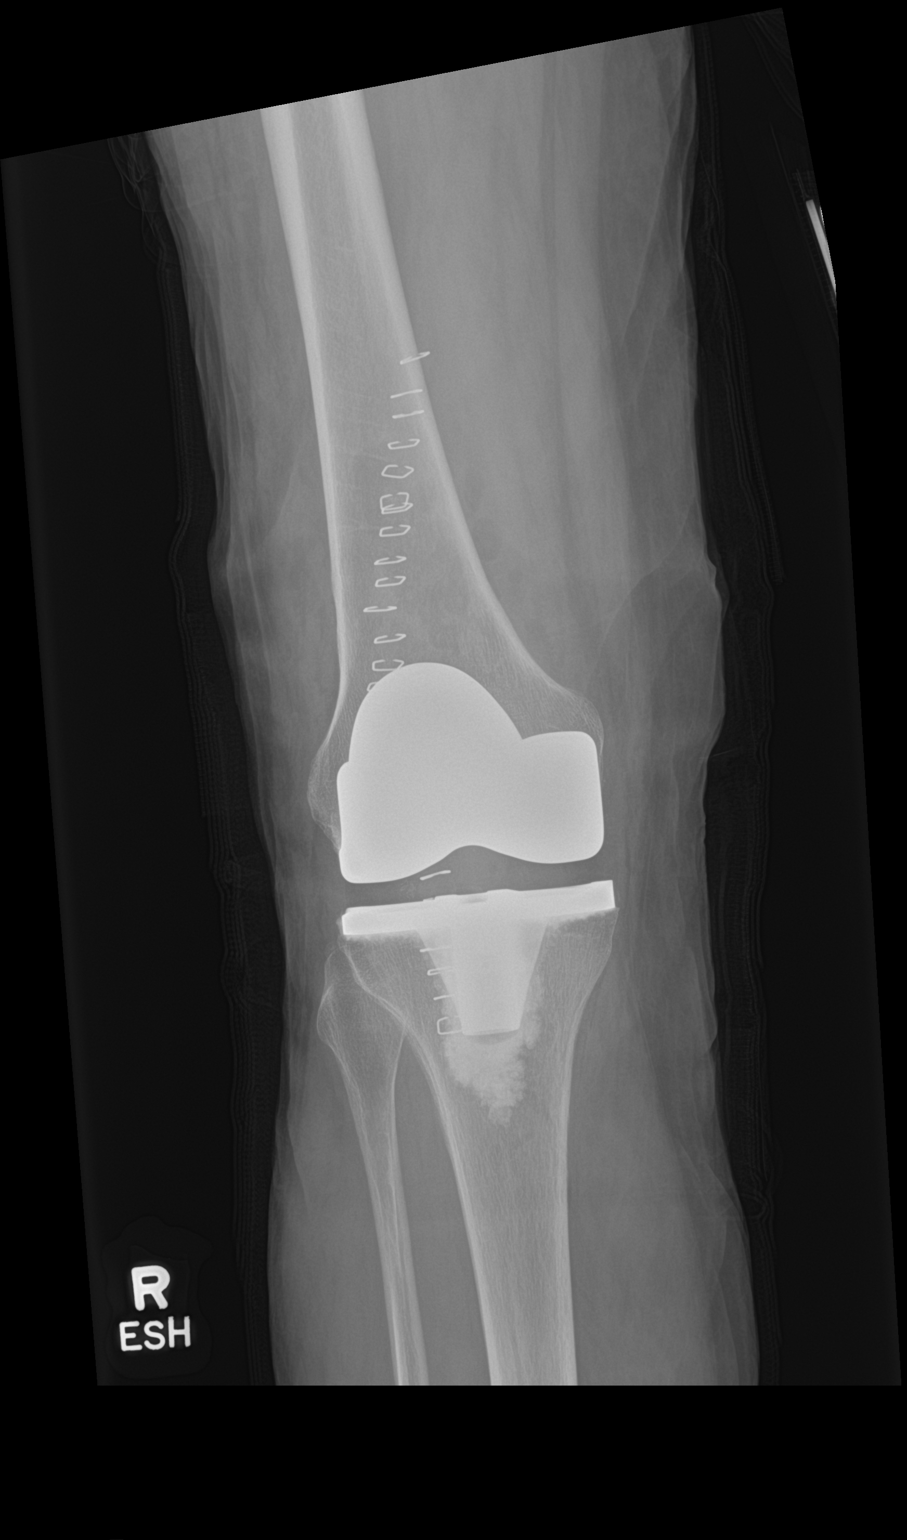

[knee lat]
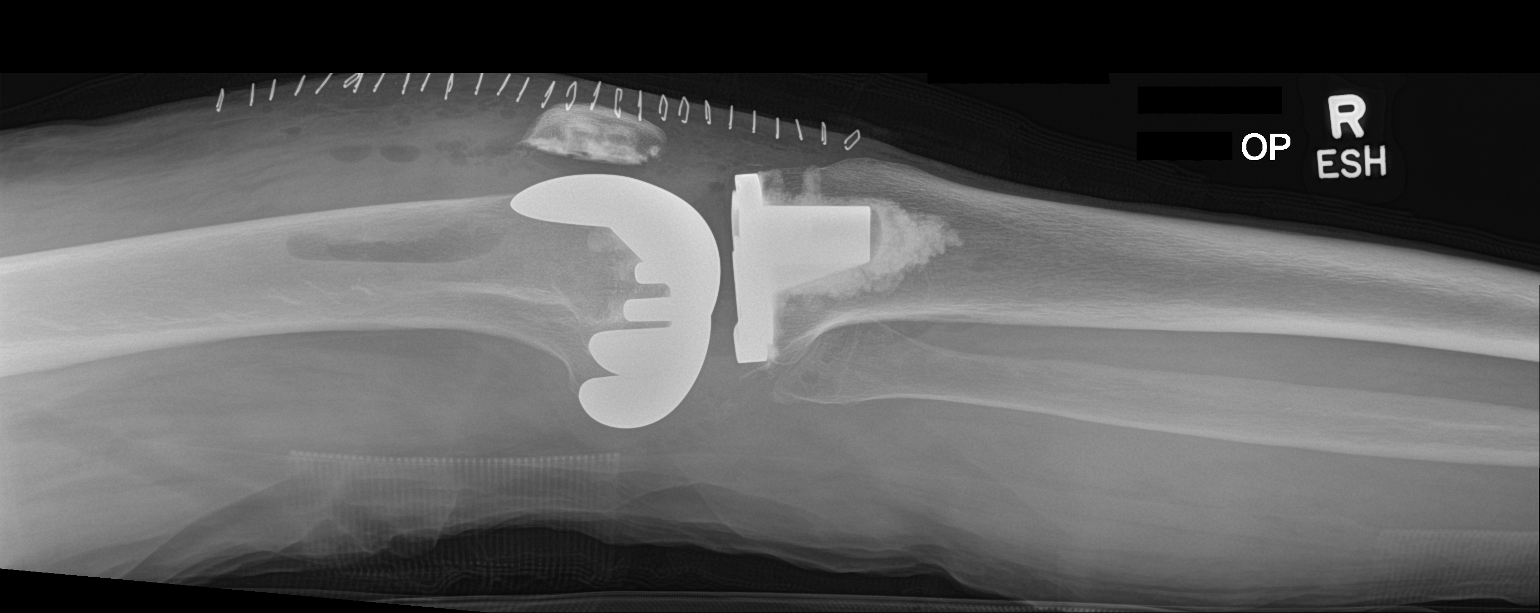

[2 of 2 positions shown; findings below may reference images not displayed]

FINDINGS: Two views study shows tricompartmental knee replacement. No evidence
for immediate hardware complication. Gas in the joint space and soft
tissues is compatible with the immediate postoperative state.
IMPRESSION: Status post right total knee replacement. No evidence for immediate
hardware complication.

## 2021-05-20 SURGERY — ARTHROPLASTY, KNEE, TOTAL
Anesthesia: Spinal | Site: Knee | Laterality: Right

## 2021-05-20 MED ORDER — DIPHENHYDRAMINE HCL 12.5 MG/5ML PO ELIX: 12.5000 mg | ORAL_SOLUTION | ORAL | Status: DC | PRN

## 2021-05-20 MED ORDER — POLYETHYLENE GLYCOL 3350 17 G PO PACK: 17.0000 g | PACK | Freq: Every day | ORAL | Status: DC | PRN

## 2021-05-20 MED ORDER — EPHEDRINE 5 MG/ML INJ
INTRAVENOUS | Status: AC
  Filled 2021-05-20: qty 5

## 2021-05-20 MED ORDER — EPHEDRINE SULFATE 50 MG/ML IJ SOLN
INTRAMUSCULAR | Status: DC | PRN
  Administered 2021-05-20 (×2): 5 mg via INTRAVENOUS

## 2021-05-20 MED ORDER — PANTOPRAZOLE SODIUM 40 MG PO TBEC
40.0000 mg | DELAYED_RELEASE_TABLET | Freq: Every day | ORAL | Status: DC
  Administered 2021-05-20 – 2021-05-21 (×2): 40 mg via ORAL
  Filled 2021-05-20 (×2): qty 1

## 2021-05-20 MED ORDER — ACETAMINOPHEN 500 MG PO TABS
1000.0000 mg | ORAL_TABLET | Freq: Once | ORAL | Status: AC
  Administered 2021-05-20: 1000 mg via ORAL
  Filled 2021-05-20: qty 2

## 2021-05-20 MED ORDER — LORAZEPAM 1 MG PO TABS
1.0000 mg | ORAL_TABLET | Freq: Every evening | ORAL | Status: DC
  Filled 2021-05-20: qty 1

## 2021-05-20 MED ORDER — ONDANSETRON HCL 4 MG/2ML IJ SOLN
INTRAMUSCULAR | Status: DC | PRN
  Administered 2021-05-20: 4 mg via INTRAVENOUS

## 2021-05-20 MED ORDER — ONDANSETRON HCL 4 MG PO TABS: 4.0000 mg | ORAL_TABLET | Freq: Four times a day (QID) | ORAL | Status: DC | PRN

## 2021-05-20 MED ORDER — ESCITALOPRAM OXALATE 20 MG PO TABS
20.0000 mg | ORAL_TABLET | Freq: Every day | ORAL | Status: DC
  Administered 2021-05-21: 20 mg via ORAL
  Filled 2021-05-20: qty 1

## 2021-05-20 MED ORDER — ASPIRIN 81 MG PO CHEW
81.0000 mg | CHEWABLE_TABLET | Freq: Two times a day (BID) | ORAL | Status: DC
  Administered 2021-05-20 – 2021-05-21 (×2): 81 mg via ORAL
  Filled 2021-05-20 (×2): qty 1

## 2021-05-20 MED ORDER — CHLORHEXIDINE GLUCONATE 0.12 % MT SOLN
15.0000 mL | Freq: Once | OROMUCOSAL | Status: AC
  Administered 2021-05-20: 15 mL via OROMUCOSAL

## 2021-05-20 MED ORDER — PROPOFOL 500 MG/50ML IV EMUL
INTRAVENOUS | Status: DC | PRN
  Administered 2021-05-20: 130 mg via INTRAVENOUS
  Administered 2021-05-20 (×2): 30 mg via INTRAVENOUS

## 2021-05-20 MED ORDER — FENTANYL CITRATE (PF) 100 MCG/2ML IJ SOLN
INTRAMUSCULAR | Status: AC
  Filled 2021-05-20: qty 2

## 2021-05-20 MED ORDER — LORAZEPAM 0.5 MG PO TABS: 0.5000 mg | ORAL_TABLET | ORAL | Status: DC

## 2021-05-20 MED ORDER — LACTATED RINGERS IV SOLN: INTRAVENOUS | Status: DC

## 2021-05-20 MED ORDER — ALUM & MAG HYDROXIDE-SIMETH 200-200-20 MG/5ML PO SUSP: 30.0000 mL | ORAL | Status: DC | PRN

## 2021-05-20 MED ORDER — OXYCODONE HCL 5 MG PO TABS
10.0000 mg | ORAL_TABLET | ORAL | Status: DC | PRN
  Administered 2021-05-20: 15 mg via ORAL
  Filled 2021-05-20: qty 2
  Filled 2021-05-20: qty 3
  Filled 2021-05-20: qty 2

## 2021-05-20 MED ORDER — 0.9 % SODIUM CHLORIDE (POUR BTL) OPTIME
TOPICAL | Status: DC | PRN
  Administered 2021-05-20: 1000 mL

## 2021-05-20 MED ORDER — PHENOL 1.4 % MT LIQD: 1.0000 | OROMUCOSAL | Status: DC | PRN

## 2021-05-20 MED ORDER — BUPIVACAINE IN DEXTROSE 0.75-8.25 % IT SOLN
INTRATHECAL | Status: DC | PRN
  Administered 2021-05-20: 1.6 mL via INTRATHECAL

## 2021-05-20 MED ORDER — OXYCODONE HCL 5 MG PO TABS
5.0000 mg | ORAL_TABLET | ORAL | Status: DC | PRN
  Administered 2021-05-20: 10 mg via ORAL
  Administered 2021-05-20: 5 mg via ORAL
  Administered 2021-05-21 (×4): 10 mg via ORAL
  Filled 2021-05-20 (×4): qty 2
  Filled 2021-05-20: qty 1

## 2021-05-20 MED ORDER — DOCUSATE SODIUM 100 MG PO CAPS
100.0000 mg | ORAL_CAPSULE | Freq: Two times a day (BID) | ORAL | Status: DC
  Administered 2021-05-20 – 2021-05-21 (×3): 100 mg via ORAL
  Filled 2021-05-20 (×3): qty 1

## 2021-05-20 MED ORDER — MENTHOL 3 MG MT LOZG: 1.0000 | LOZENGE | OROMUCOSAL | Status: DC | PRN

## 2021-05-20 MED ORDER — FENTANYL CITRATE PF 50 MCG/ML IJ SOSY
PREFILLED_SYRINGE | INTRAMUSCULAR | Status: AC
  Filled 2021-05-20: qty 3

## 2021-05-20 MED ORDER — PROPOFOL 500 MG/50ML IV EMUL
INTRAVENOUS | Status: DC | PRN
  Administered 2021-05-20: 75 ug/kg/min via INTRAVENOUS

## 2021-05-20 MED ORDER — SODIUM CHLORIDE 0.9 % IV SOLN: INTRAVENOUS | Status: DC

## 2021-05-20 MED ORDER — LORAZEPAM 0.5 MG PO TABS
0.5000 mg | ORAL_TABLET | Freq: Every morning | ORAL | Status: DC
  Administered 2021-05-20 – 2021-05-21 (×2): 0.5 mg via ORAL
  Filled 2021-05-20 (×2): qty 1

## 2021-05-20 MED ORDER — METOCLOPRAMIDE HCL 5 MG/ML IJ SOLN: 5.0000 mg | Freq: Three times a day (TID) | INTRAMUSCULAR | Status: DC | PRN

## 2021-05-20 MED ORDER — ALBUTEROL SULFATE HFA 108 (90 BASE) MCG/ACT IN AERS: 2.0000 | INHALATION_SPRAY | Freq: Four times a day (QID) | RESPIRATORY_TRACT | Status: DC | PRN

## 2021-05-20 MED ORDER — ROPIVACAINE HCL 5 MG/ML IJ SOLN
INTRAMUSCULAR | Status: DC | PRN
  Administered 2021-05-20: 30 mL via PERINEURAL

## 2021-05-20 MED ORDER — PHENYLEPHRINE HCL-NACL 20-0.9 MG/250ML-% IV SOLN
INTRAVENOUS | Status: AC
  Filled 2021-05-20: qty 500

## 2021-05-20 MED ORDER — KETAMINE HCL 10 MG/ML IJ SOLN
INTRAMUSCULAR | Status: AC
  Filled 2021-05-20: qty 1

## 2021-05-20 MED ORDER — METOCLOPRAMIDE HCL 5 MG PO TABS: 5.0000 mg | ORAL_TABLET | Freq: Three times a day (TID) | ORAL | Status: DC | PRN

## 2021-05-20 MED ORDER — SODIUM CHLORIDE 0.9 % IR SOLN
Status: DC | PRN
  Administered 2021-05-20: 1000 mL

## 2021-05-20 MED ORDER — BUPIVACAINE-EPINEPHRINE (PF) 0.25% -1:200000 IJ SOLN
INTRAMUSCULAR | Status: AC
  Filled 2021-05-20: qty 30

## 2021-05-20 MED ORDER — MIDAZOLAM HCL 5 MG/5ML IJ SOLN
INTRAMUSCULAR | Status: DC | PRN
  Administered 2021-05-20: 2 mg via INTRAVENOUS

## 2021-05-20 MED ORDER — CEFAZOLIN SODIUM-DEXTROSE 2-4 GM/100ML-% IV SOLN
2.0000 g | INTRAVENOUS | Status: AC
  Administered 2021-05-20: 2 g via INTRAVENOUS
  Filled 2021-05-20: qty 100

## 2021-05-20 MED ORDER — ACETAMINOPHEN 325 MG PO TABS
325.0000 mg | ORAL_TABLET | Freq: Four times a day (QID) | ORAL | Status: DC | PRN
  Administered 2021-05-21: 650 mg via ORAL
  Filled 2021-05-20: qty 2

## 2021-05-20 MED ORDER — GABAPENTIN 400 MG PO CAPS
800.0000 mg | ORAL_CAPSULE | Freq: Three times a day (TID) | ORAL | Status: DC
  Administered 2021-05-20 – 2021-05-21 (×4): 800 mg via ORAL
  Filled 2021-05-20 (×4): qty 2

## 2021-05-20 MED ORDER — CEFAZOLIN SODIUM-DEXTROSE 1-4 GM/50ML-% IV SOLN
1.0000 g | Freq: Four times a day (QID) | INTRAVENOUS | Status: AC
Start: 2021-05-20 — End: 2021-05-20
  Administered 2021-05-20 (×2): 1 g via INTRAVENOUS
  Filled 2021-05-20 (×2): qty 50

## 2021-05-20 MED ORDER — ALBUTEROL SULFATE (2.5 MG/3ML) 0.083% IN NEBU: 2.5000 mg | INHALATION_SOLUTION | Freq: Four times a day (QID) | RESPIRATORY_TRACT | Status: DC | PRN

## 2021-05-20 MED ORDER — PROMETHAZINE HCL 25 MG/ML IJ SOLN: 6.2500 mg | INTRAMUSCULAR | Status: DC | PRN

## 2021-05-20 MED ORDER — KETAMINE HCL 10 MG/ML IJ SOLN
INTRAMUSCULAR | Status: DC | PRN
  Administered 2021-05-20: 30 mg via INTRAVENOUS

## 2021-05-20 MED ORDER — TRANEXAMIC ACID-NACL 1000-0.7 MG/100ML-% IV SOLN
1000.0000 mg | INTRAVENOUS | Status: AC
  Administered 2021-05-20: 1000 mg via INTRAVENOUS
  Filled 2021-05-20: qty 100

## 2021-05-20 MED ORDER — DEXAMETHASONE SODIUM PHOSPHATE 4 MG/ML IJ SOLN
INTRAMUSCULAR | Status: DC | PRN
  Administered 2021-05-20: 4 mg via INTRAVENOUS

## 2021-05-20 MED ORDER — FENTANYL CITRATE PF 50 MCG/ML IJ SOSY
25.0000 ug | PREFILLED_SYRINGE | INTRAMUSCULAR | Status: DC | PRN
  Administered 2021-05-20 (×3): 50 ug via INTRAVENOUS

## 2021-05-20 MED ORDER — BUPIVACAINE-EPINEPHRINE 0.25% -1:200000 IJ SOLN
INTRAMUSCULAR | Status: DC | PRN
  Administered 2021-05-20: 30 mL

## 2021-05-20 MED ORDER — PREDNISONE 5 MG PO TABS
10.0000 mg | ORAL_TABLET | Freq: Every day | ORAL | Status: DC
  Administered 2021-05-21: 10 mg via ORAL
  Filled 2021-05-20: qty 2

## 2021-05-20 MED ORDER — LORAZEPAM 1 MG PO TABS
1.0000 mg | ORAL_TABLET | ORAL | Status: DC
  Administered 2021-05-20: 1 mg via ORAL
  Filled 2021-05-20: qty 1

## 2021-05-20 MED ORDER — HYDROMORPHONE HCL 1 MG/ML IJ SOLN
0.5000 mg | INTRAMUSCULAR | Status: DC | PRN
  Administered 2021-05-21: 1 mg via INTRAVENOUS
  Filled 2021-05-20: qty 1

## 2021-05-20 MED ORDER — TACROLIMUS 0.03 % EX OINT: 1.0000 "application " | TOPICAL_OINTMENT | Freq: Two times a day (BID) | CUTANEOUS | Status: DC

## 2021-05-20 MED ORDER — ONDANSETRON HCL 4 MG/2ML IJ SOLN: 4.0000 mg | Freq: Four times a day (QID) | INTRAMUSCULAR | Status: DC | PRN

## 2021-05-20 MED ORDER — CLONIDINE HCL (ANALGESIA) 100 MCG/ML EP SOLN
EPIDURAL | Status: DC | PRN
  Administered 2021-05-20: 50 ug

## 2021-05-20 MED ORDER — ORAL CARE MOUTH RINSE: 15.0000 mL | Freq: Once | OROMUCOSAL | Status: AC

## 2021-05-20 MED ORDER — PROPOFOL 1000 MG/100ML IV EMUL
INTRAVENOUS | Status: AC
  Filled 2021-05-20: qty 300

## 2021-05-20 MED ORDER — FENTANYL CITRATE (PF) 100 MCG/2ML IJ SOLN
INTRAMUSCULAR | Status: DC | PRN
  Administered 2021-05-20: 100 ug via INTRAVENOUS
  Administered 2021-05-20 (×4): 50 ug via INTRAVENOUS

## 2021-05-20 MED ORDER — MIDAZOLAM HCL 2 MG/2ML IJ SOLN
INTRAMUSCULAR | Status: AC
  Filled 2021-05-20: qty 2

## 2021-05-20 SURGICAL SUPPLY — 64 items
APL SKNCLS STERI-STRIP NONHPOA (GAUZE/BANDAGES/DRESSINGS)
BAG COUNTER SPONGE SURGICOUNT (BAG) IMPLANT
BAG SPEC THK2 15X12 ZIP CLS (MISCELLANEOUS) ×1
BAG SPNG CNTER NS LX DISP (BAG)
BAG ZIPLOCK 12X15 (MISCELLANEOUS) ×2 IMPLANT
BENZOIN TINCTURE PRP APPL 2/3 (GAUZE/BANDAGES/DRESSINGS) IMPLANT
BLADE SAG 18X100X1.27 (BLADE) ×2 IMPLANT
BLADE SURG SZ10 CARB STEEL (BLADE) ×4 IMPLANT
BNDG ELASTIC 6X5.8 VLCR STR LF (GAUZE/BANDAGES/DRESSINGS) ×4 IMPLANT
BOWL SMART MIX CTS (DISPOSABLE) ×1 IMPLANT
BSPLAT TIB 5D D CMNT STM RT (Knees) ×1 IMPLANT
CEMENT BONE R 1X40 (Cement) ×2 IMPLANT
COMP FEM CR CEMT STD SZ5 (Joint) ×2 IMPLANT
COMPONENT FEM CR CEMT STD SZ5 (Joint) IMPLANT
COOLER ICEMAN CLASSIC (MISCELLANEOUS) ×2 IMPLANT
COVER SURGICAL LIGHT HANDLE (MISCELLANEOUS) ×2 IMPLANT
CUFF TOURN SGL QUICK 34 (TOURNIQUET CUFF) ×2
CUFF TRNQT CYL 34X4.125X (TOURNIQUET CUFF) ×1 IMPLANT
DECANTER SPIKE VIAL GLASS SM (MISCELLANEOUS) IMPLANT
DRAPE U-SHAPE 47X51 STRL (DRAPES) ×2 IMPLANT
DRSG PAD ABDOMINAL 8X10 ST (GAUZE/BANDAGES/DRESSINGS) ×4 IMPLANT
DURAPREP 26ML APPLICATOR (WOUND CARE) ×2 IMPLANT
ELECT BLADE TIP CTD 4 INCH (ELECTRODE) ×2 IMPLANT
ELECT REM PT RETURN 15FT ADLT (MISCELLANEOUS) ×2 IMPLANT
GAUZE SPONGE 4X4 12PLY STRL (GAUZE/BANDAGES/DRESSINGS) ×2 IMPLANT
GAUZE XEROFORM 1X8 LF (GAUZE/BANDAGES/DRESSINGS) ×1 IMPLANT
GLOVE SRG 8 PF TXTR STRL LF DI (GLOVE) ×2 IMPLANT
GLOVE SURG ENC MOIS LTX SZ7.5 (GLOVE) ×2 IMPLANT
GLOVE SURG NEOPR MICRO LF SZ8 (GLOVE) ×2 IMPLANT
GLOVE SURG UNDER POLY LF SZ8 (GLOVE) ×4
GOWN STRL REUS W/TWL XL LVL3 (GOWN DISPOSABLE) ×4 IMPLANT
HANDPIECE INTERPULSE COAX TIP (DISPOSABLE) ×2
HDLS TROCR DRIL PIN KNEE 75 (PIN) ×2
HOLDER FOLEY CATH W/STRAP (MISCELLANEOUS) IMPLANT
IMMOBILIZER KNEE 20 (SOFTGOODS) ×2
IMMOBILIZER KNEE 20 THIGH 36 (SOFTGOODS) ×1 IMPLANT
INSERT TIB AS PERS SZ CD 12 RT (Insert) ×1 IMPLANT
KIT TURNOVER KIT A (KITS) ×2 IMPLANT
NS IRRIG 1000ML POUR BTL (IV SOLUTION) ×2 IMPLANT
PACK TOTAL KNEE CUSTOM (KITS) ×2 IMPLANT
PAD COLD SHLDR WRAP-ON (PAD) ×2 IMPLANT
PADDING CAST ABS 6INX4YD NS (CAST SUPPLIES) ×2
PADDING CAST ABS COTTON 6X4 NS (CAST SUPPLIES) IMPLANT
PADDING CAST COTTON 6X4 STRL (CAST SUPPLIES) ×4 IMPLANT
PENCIL SMOKE EVACUATOR (MISCELLANEOUS) IMPLANT
PIN DRILL HDLS TROCAR 75 4PK (PIN) IMPLANT
PROTECTOR NERVE ULNAR (MISCELLANEOUS) ×2 IMPLANT
SCREW FEMALE HEX FIX 25X2.5 (ORTHOPEDIC DISPOSABLE SUPPLIES) ×1 IMPLANT
SCREW HEADED 48MM KNEE (MISCELLANEOUS) ×2 IMPLANT
SET HNDPC FAN SPRY TIP SCT (DISPOSABLE) ×1 IMPLANT
SET PAD KNEE POSITIONER (MISCELLANEOUS) ×2 IMPLANT
STAPLER VISISTAT 35W (STAPLE) IMPLANT
STEM POLY PAT PLY 29M KNEE (Knees) ×1 IMPLANT
STEM TIBIA 5 DEG SZ D R KNEE (Knees) IMPLANT
STRIP CLOSURE SKIN 1/2X4 (GAUZE/BANDAGES/DRESSINGS) IMPLANT
SUT MNCRL AB 4-0 PS2 18 (SUTURE) IMPLANT
SUT VIC AB 0 CT1 27 (SUTURE) ×2
SUT VIC AB 0 CT1 27XBRD ANTBC (SUTURE) ×1 IMPLANT
SUT VIC AB 1 CT1 36 (SUTURE) ×4 IMPLANT
SUT VIC AB 2-0 CT1 27 (SUTURE) ×4
SUT VIC AB 2-0 CT1 TAPERPNT 27 (SUTURE) ×2 IMPLANT
TIBIA STEM 5 DEG SZ D R KNEE (Knees) ×2 IMPLANT
TRAY FOLEY MTR SLVR 16FR STAT (SET/KITS/TRAYS/PACK) IMPLANT
WATER STERILE IRR 1000ML POUR (IV SOLUTION) ×4 IMPLANT

## 2021-05-20 NOTE — Plan of Care (Signed)
  Problem: Activity: Goal: Ability to avoid complications of mobility impairment will improve Outcome: Progressing Goal: Range of joint motion will improve Outcome: Progressing   Problem: Pain Management: Goal: Pain level will decrease with appropriate interventions Outcome: Progressing   

## 2021-05-20 NOTE — Op Note (Signed)
NAME: Brittany Hobbs, [place] RECORD NO: [id number] ACCOUNT NO: [id number] DATE OF BIRTH: 05/23/1961 FACILITY: [facility]ATION: [id number] PHYSICIAN: Lind Guest. Ninfa Linden, MD  Operative Report   DATE OF PROCEDURE: 05/20/2021  PREOPERATIVE DIAGNOSIS:  Osteoarthritis and degenerative joint disease, right knee.  POSTOPERATIVE DIAGNOSIS:  Osteoarthritis and degenerative joint disease, right knee.  PROCEDURE:  Right total knee arthroplasty.  IMPLANTS:  Biomet/Zimmer Persona knee system with size 5 femur, size D tibial tray, 29 mm polyethylene button, 12 mm medial congruent polyethylene insert.  SURGEON:  Lind Guest. Ninfa Linden, MD  ASSISTANT:  RNFA.  BLOOD LOSS:  Less than 100 mL.  ANTIBIOTICS:  2 g IV Ancef.  TOURNIQUET TIME:  Less than 1 hour.  COMPLICATIONS:  None.  INDICATIONS:  The patient is a 60 year old female, well known to me.  She has debilitating arthritis involving her right knee.  She has tried and failed all forms of conservative treatment, at this point does wish to proceed with a total knee  arthroplasty given the detrimental effect her arthritis is having on her mobility, her quality of life, and activities of daily living.  We recommended this as well based on her x-rays and clinical exam findings combined with a very conservative  treatment.  We had a long and thorough discussion about the risk of the surgery including the risk of acute blood loss anemia, nerve or vessel injury, fracture, infection, DVT, and implant failure.  We talked about our goals being decreased pain,  improved mobility, and overall improved quality of life.  DESCRIPTION OF PROCEDURE:  After informed consent was obtained, appropriate right knee was marked.  Adductor canal block was obtained of the right lower extremity in the holding room.  She was then brought to the operating room and sat up on the  operating table.  Spinal anesthesia was obtained.  She was laid in supine position on the  operating table and a Foley catheter was placed.  Her right knee was prepped and draped with DuraPrep and sterile drapes including a sterile stockinette.  A timeout  was called.  She was identified as correct patient, correct right knee.  Even before I made an incision, though she was not comfortable I think with the tourniquet being up and so they ended up performing general anesthesia via an LMA.  We then made an  incision over the patella and carried this proximally and distally.  We dissected down the knee joint, carried out a medial parapatellar arthrotomy.  With the knee in a flexed position, we found her to have valgus malalignment.  We removed remnants of  ACL, medial and lateral meniscus, and osteophytes around the knee.  She had significant cartilage loss in the lateral compartment of her knee.  Using extramedullary cutting guide for setting our proximal tibia cut, we selected this for a 3-degree slope  correction of varus and valgus and taking 9 mm off the high side.  We made this cut without difficulty.  We then went to the femur, did intramedullary guide for the femur, setting her distal femoral cutting guide for right knee at 5 degrees externally  rotated.  We made our 10 mm distal femoral cut and brought the knee back down to full extension.  With 10 mm extension block, we had achieved full extension.  We then went back to the femur, put our femoral sizing guide based off the epicondylar axis and  Whitesides line.  Based off of this, we chose a size 5 femur.  We  put a 4-in-1 cutting block for a size 5 right femur and made our anterior and posterior cuts followed by our chamfer cuts.  We then went back to the tibia and chose a size D right tibial  tray for coverage doing our drill for the post and our keel punch.  With size D right tibia, we trialed a right 5 femur and then a 10 mm followed by 12 mm medial congruent polyethylene insert.  We were pleased with the stability and motion of the 12 mm   insert.  We then made our patellar cut and drilled 3 holes for size 29 patella.  With all trial instrumentation in the knee, I put her through several cycles of motion.  We were pleased with range of motion and stability.  We then removed all  instrumentation from the knee and irrigated the knee with normal saline solution using pulsatile lavage.  We then dried the knee real well and placed a mixture of 0.25% Marcaine mixed with epinephrine around the arthrotomy.  We then mixed our cement and  with the knee in a flexed position cemented our St[employer]ker Persona right D tibial tray followed by our size right 5 femur.  We placed our 12 mm fixed bearing medial congruent polyethylene insert and cemented our size 29 patellar button.  We then held the  knee compressed in an extended position while the cement hardened.  Once it hardened, we put her through range of motion.  We were pleased with range of motion and stability.  We then let the tourniquet down, and hemostasis was obtained with  electrocautery.  We closed the arthrotomy with interrupted #1 Vicryl suture followed by 0 Vicryl to close the deep tissue and 2-0 Vicryl to close subcutaneous tissue.  The skin was reapproximated with staples.  A Xeroform and well-padded sterile dressing  was applied.  She was awakened, extubated, and taken to recovery room in stable condition with all final counts being correct and no complications noted.   [facility] D: 05/20/2021 9:00:02 am T: 05/20/2021 4:46:00 pm  JOB: [id number]

## 2021-05-20 NOTE — Interval H&P Note (Signed)
History and Physical Interval Note: The patient understands that she is here today for a right total knee replacement to treat her right knee osteoarthritis.  There has been no acute or interval change in her medical status.  Please see recent H&P.  The risks and benefits of surgery have been explained in detail and informed consent is obtained.  05/20/2021 6:58 AM  Brittany Hobbs  has presented today for surgery, with the diagnosis of osteoarthritis right knee.  The various methods of treatment have been discussed with the patient and family. After consideration of risks, benefits and other options for treatment, the patient has consented to  Procedure(s) with comments: RIGHT TOTAL KNEE ARTHROPLASTY (Right) - Needs RNFA as a surgical intervention.  The patient's history has been reviewed, patient examined, no change in status, stable for surgery.  I have reviewed the patient's chart and labs.  Questions were answered to the patient's satisfaction.     Mcarthur Rossetti

## 2021-05-20 NOTE — Transfer of Care (Signed)
Immediate Anesthesia Transfer of Care Note  Patient: Edom Munos  Procedure(s) Performed: RIGHT TOTAL KNEE ARTHROPLASTY (Right: Knee)  Patient Location: PACU  Anesthesia Type:General  Level of Consciousness: awake, alert  and oriented   Airway & Oxygen Therapy: Patient Spontanous Breathing and Patient connected to face mask oxygen  Post-op Assessment: Report given to RN and Post -op Vital signs reviewed and stable  Post vital signs: Reviewed and stable  Last Vitals:  Vitals Value Taken Time  BP 176/92 05/20/21 0923  Temp 36.9 C 05/20/21 0924  Pulse 102 05/20/21 0927  Resp 12 05/20/21 0927  SpO2 94 % 05/20/21 0927  Vitals shown include unvalidated device data.  Last Pain:  Vitals:   05/20/21 0549  TempSrc: Oral         Complications: No notable events documented.

## 2021-05-20 NOTE — Evaluation (Signed)
Physical Therapy Evaluation Patient Details Name: Brittany Hobbs MRN: [id number] DOB: 1961/09/04 Today's Date: 05/20/2021   History of Present Illness  Patient 60 y.o. female s/p Rt TKA on 05/20/21 with PMH significant for OA, asthma, HLD, lupus, pani attacks, Covid PNA, Rt wrist fracture, Lt patella fracture.   Clinical Impression  Mykenna Service is a 60 y.o. female POD 0 s/p Rt TKA. Patient reports independence with mobility at baseline. Patient is now limited by functional impairments (see PT problem list below) and requires min assist/guard for transfers and gait with RW. Patient was able to ambulate ~90 feet with RW and min assist. Patient instructed in exercise to facilitate circulation to manage edema and reduce risk of DVT. Patient will benefit from continued skilled PT interventions to address impairments and progress towards PLOF. Acute PT will follow to progress mobility and stair training in preparation for safe discharge home.     Follow Up Recommendations Follow surgeon's recommendation for DC plan and follow-up therapies    Equipment Recommendations  Rolling walker with 5" wheels;3in1 (PT)    Recommendations for Other Services       Precautions / Restrictions Precautions Precautions: Fall Restrictions Weight Bearing Restrictions: No Other Position/Activity Restrictions: WBAT      Mobility  Bed Mobility Overal bed mobility: Needs Assistance Bed Mobility: Supine to Sit     Supine to sit: Min guard;HOB elevated     General bed mobility comments: cues for sfaety as pt eager to mobilize. pt using bed rail and able to bring LE's off EOB without assist.    Transfers Overall transfer level: Needs assistance Equipment used: Rolling walker (2 wheeled) Transfers: Sit to/from Stand Sit to Stand: Min guard;Min assist         General transfer comment: cues for safe hand placement with RW for power up. no overt LOB noted  Ambulation/Gait Ambulation/Gait  assistance: Min assist Gait Distance (Feet): 90 Feet Assistive device: Rolling walker (2 wheeled) Gait Pattern/deviations: Step-to pattern;Step-through pattern;Decreased stance time - left;Decreased stride length;Decreased weight shift to right Gait velocity: decr   General Gait Details: cues for step to pattern and pt progressed to step through with no buckling at Rt knee and no LOB noted. Cues and assist needed to maintain safe proximity to RW.  Stairs            Wheelchair Mobility    Modified Rankin (Stroke Patients Only)       Balance Overall balance assessment: Needs assistance;Mild deficits observed, not formally tested Sitting-balance support: Feet supported Sitting balance-Leahy Scale: Good     Standing balance support: Bilateral upper extremity supported;During functional activity Standing balance-Leahy Scale: Fair                               Pertinent Vitals/Pain Pain Assessment: 0-10 Pain Score: 3  Pain Location: Rt knee Pain Descriptors / Indicators: Aching;Discomfort Pain Intervention(s): Limited activity within patient's tolerance;Monitored during session;Repositioned;Ice applied    Home Living Family/patient expects to be discharged to:: Skilled nursing facility Living Arrangements: Alone Available Help at Discharge: Family Type of Home: House Home Access: Stairs to enter Entrance Stairs-Rails: Can reach both Entrance Stairs-Number of Steps: 4+1 Home Layout: One level Home Equipment: Walker - standard;Cane - single point      Prior Function Level of Independence: Independent               Hand Dominance   Dominant Hand:  Right    Extremity/Trunk Assessment   Upper Extremity Assessment Upper Extremity Assessment: Overall WFL for tasks assessed    Lower Extremity Assessment Lower Extremity Assessment: RLE deficits/detail RLE Deficits / Details: good quad activation, no extensor lag with SLR. RLE Sensation: WNL RLE  Coordination: WNL    Cervical / Trunk Assessment Cervical / Trunk Assessment: Normal  Communication   Communication: No difficulties  Cognition Arousal/Alertness: Awake/alert Behavior During Therapy: WFL for tasks assessed/performed Overall Cognitive Status: Within Functional Limits for tasks assessed                                        General Comments      Exercises Total Joint Exercises Ankle Circles/Pumps: AROM;Both;20 reps;Seated   Assessment/Plan    PT Assessment Patient needs continued PT services  PT Problem List Decreased strength;Decreased range of motion;Decreased activity tolerance;Decreased mobility;Decreased balance;Decreased knowledge of use of DME;Decreased safety awareness;Decreased knowledge of precautions       PT Treatment Interventions DME instruction;Gait training;Stair training;Functional mobility training;Therapeutic activities;Therapeutic exercise;Balance training;Patient/family education    PT Goals (Current goals can be found in the Care Plan section)  Acute Rehab PT Goals Patient Stated Goal: get back into garden PT Goal Formulation: With patient Time For Goal Achievement: 05/27/21 Potential to Achieve Goals: Good    Frequency 7X/week   Barriers to discharge Decreased caregiver support pt does not have anyone to stay with her, planning to go to SNF for rehab    Co-evaluation               AM-PAC PT "6 Clicks" Mobility  Outcome Measure Help needed turning from your back to your side while in a flat bed without using bedrails?: None Help needed moving from lying on your back to sitting on the side of a flat bed without using bedrails?: A Little Help needed moving to and from a bed to a chair (including a wheelchair)?: A Little Help needed standing up from a chair using your arms (e.g., wheelchair or bedside chair)?: A Little Help needed to walk in hospital room?: A Little Help needed climbing 3-5 steps with a  railing? : A Little 6 Click Score: 19    End of Session Equipment Utilized During Treatment: Gait belt Activity Tolerance: Patient tolerated treatment well Patient left: in chair;with call bell/phone within reach;with chair alarm set;with family/visitor present Nurse Communication: Mobility status PT Visit Diagnosis: Muscle weakness (generalized) (M62.81);Difficulty in walking, not elsewhere classified (R26.2)    Time: [id number] PT Time Calculation (min) (ACUTE ONLY): 24 min   Charges:   PT Evaluation $PT Eval Low Complexity: 1 Low PT Treatments $Gait Training: 8-22 mins        Verner Mould, DPT Acute Rehabilitation Services Office [phone] Pager [phone]   Jacques Navy 05/20/2021, 4:24 PM

## 2021-05-20 NOTE — Anesthesia Procedure Notes (Signed)
Procedure Name: LMA Insertion Date/Time: 05/20/2021 7:59 AM Performed by: Gean Maidens, CRNA Pre-anesthesia Checklist: Patient identified, Emergency Drugs available, Suction available, Patient being monitored and Timeout performed Patient Re-evaluated:Patient Re-evaluated prior to induction Oxygen Delivery Method: Circle system utilized Preoxygenation: Pre-oxygenation with 100% oxygen Induction Type: IV induction Ventilation: Mask ventilation without difficulty LMA: LMA inserted LMA Size: 4.0 Number of attempts: 1 Placement Confirmation: positive ETCO2 and breath sounds checked- equal and bilateral Tube secured with: Tape Dental Injury: Teeth and Oropharynx as per pre-operative assessment

## 2021-05-20 NOTE — Anesthesia Procedure Notes (Signed)
Spinal  Patient location during procedure: OR Start time: 05/20/2021 7:21 AM End time: 05/20/2021 7:27 AM Reason for block: surgical anesthesia Staffing Performed: anesthesiologist  Anesthesiologist: Catalina Gravel, MD Preanesthetic Checklist Completed: patient identified, IV checked, risks and benefits discussed, surgical consent, monitors and equipment checked, pre-op evaluation and timeout performed Spinal Block Patient position: sitting Prep: DuraPrep and site prepped and draped Patient monitoring: continuous pulse ox and blood pressure Approach: midline Location: L3-4 Injection technique: single-shot Needle Needle type: Pencan  Needle gauge: 24 G Assessment Events: CSF return Additional Notes Functioning IV was confirmed and monitors were applied. Sterile prep and drape, including hand hygiene, mask and sterile gloves were used. The patient was positioned and the spine was prepped. The skin was anesthetized with lidocaine.  Free flow of clear CSF was obtained prior to injecting local anesthetic into the CSF.  The spinal needle aspirated freely following injection.  The needle was carefully withdrawn.  The patient tolerated the procedure well. Consent was obtained prior to procedure with all questions answered and concerns addressed. Risks including but not limited to bleeding, infection, nerve damage, paralysis, failed block, inadequate analgesia, allergic reaction, high spinal, itching and headache were discussed and the patient wished to proceed.   Hoy Morn, MD

## 2021-05-20 NOTE — Anesthesia Procedure Notes (Signed)
Anesthesia Regional Block: Adductor canal block   Pre-Anesthetic Checklist: , timeout performed,  Correct Patient, Correct Site, Correct Laterality,  Correct Procedure, Correct Position, site marked,  Risks and benefits discussed,  Surgical consent,  Pre-op evaluation,  At surgeon's request and post-op pain management  Laterality: Right  Prep: chloraprep       Needles:  Injection technique: Single-shot  Needle Type: Echogenic Needle     Needle Length: 9cm  Needle Gauge: 21     Additional Needles:   Procedures:,,,, ultrasound used (permanent image in chart),,    Narrative:  Start time: 05/20/2021 6:45 AM End time: 05/20/2021 6:51 AM Injection made incrementally with aspirations every 5 mL.  Performed by: Personally  Anesthesiologist: Catalina Gravel, MD  Additional Notes: No pain on injection. No increased resistance to injection. Injection made in 5cc increments.  Good needle visualization.  Patient tolerated procedure well.

## 2021-05-20 NOTE — Plan of Care (Signed)
  Problem: Education: Goal: Knowledge of the prescribed therapeutic regimen will improve Outcome: Progressing   Problem: Pain Management: Goal: Pain level will decrease with appropriate interventions Outcome: Progressing   

## 2021-05-20 NOTE — Anesthesia Postprocedure Evaluation (Signed)
Anesthesia Post Note  Patient: Steffani Bon  Procedure(s) Performed: RIGHT TOTAL KNEE ARTHROPLASTY (Right: Knee)     Patient location during evaluation: PACU Anesthesia Type: Spinal and General Level of consciousness: oriented, awake and alert and awake Pain management: pain level controlled Vital Signs Assessment: post-procedure vital signs reviewed and stable Respiratory status: spontaneous breathing, respiratory function stable and nonlabored ventilation Cardiovascular status: blood pressure returned to baseline and stable Postop Assessment: no headache, no backache, no apparent nausea or vomiting, patient able to bend at knees and spinal receding Anesthetic complications: no Comments: Patient did not tolerate tourniquet inflation, conversion to GA. Comfortable in PACU.   No notable events documented.  Last Vitals:  Vitals:   05/20/21 0945 05/20/21 0950  BP:  (!) 161/80  Pulse: 98 95  Resp: (!) 23 (!) 22  Temp:    SpO2: 100% 97%    Last Pain:  Vitals:   05/20/21 0955  TempSrc:   PainSc: 7                  Catalina Gravel

## 2021-05-20 NOTE — Brief Op Note (Signed)
05/20/2021  9:01 AM  PATIENT:  Brittany Hobbs  60 y.o. female  PRE-OPERATIVE DIAGNOSIS:  osteoarthritis right knee  POST-OPERATIVE DIAGNOSIS:  osteoarthritis right knee  PROCEDURE:  Procedure(s) with comments: RIGHT TOTAL KNEE ARTHROPLASTY (Right) - Needs RNFA  SURGEON:  Surgeon(s) and Role:    Mcarthur Rossetti, MD - Primary  ASSISTANTS: RNFA - Megan   ANESTHESIA:   local, regional, spinal, and general  EBL:  25 mL   COUNTS:  YES  TOURNIQUET:   Total Tourniquet Time Documented: Thigh (Right) - 53 minutes Total: Thigh (Right) - 53 minutes   DICTATION: .Other Dictation: Dictation Number [id number]  PLAN OF CARE: Admit for overnight observation  PATIENT DISPOSITION:  PACU - hemodynamically stable.   Delay start of Pharmacological VTE agent (>24hrs) due to surgical blood loss or risk of bleeding: no

## 2021-05-21 DIAGNOSIS — M1711 Unilateral primary osteoarthritis, right knee: Secondary | ICD-10-CM | POA: Diagnosis not present

## 2021-05-21 LAB — CBC
HCT: 38 % (ref 36.0–46.0)
Hemoglobin: 12.1 g/dL (ref 12.0–15.0)
MCH: 28.2 pg (ref 26.0–34.0)
MCHC: 31.8 g/dL (ref 30.0–36.0)
MCV: 88.6 fL (ref 80.0–100.0)
Platelets: 223 10*3/uL (ref 150–400)
RBC: 4.29 MIL/uL (ref 3.87–5.11)
RDW: 15.7 % — ABNORMAL HIGH (ref 11.5–15.5)
WBC: 9.7 10*3/uL (ref 4.0–10.5)
nRBC: 0 % (ref 0.0–0.2)

## 2021-05-21 LAB — BASIC METABOLIC PANEL
Anion gap: 3 — ABNORMAL LOW (ref 5–15)
BUN: 26 mg/dL — ABNORMAL HIGH (ref 6–20)
CO2: 28 mmol/L (ref 22–32)
Calcium: 8.2 mg/dL — ABNORMAL LOW (ref 8.9–10.3)
Chloride: 107 mmol/L (ref 98–111)
Creatinine, Ser: 0.66 mg/dL (ref 0.44–1.00)
GFR, Estimated: >60 mL/min (ref >60)
Glucose, Bld: 108 mg/dL — ABNORMAL HIGH (ref 70–99)
Potassium: 3.6 mmol/L (ref 3.5–5.1)
Sodium: 138 mmol/L (ref 135–145)

## 2021-05-21 MED ORDER — ASPIRIN 81 MG PO CHEW: 81.0000 mg | CHEWABLE_TABLET | Freq: Two times a day (BID) | ORAL | 0 refills | Status: DC

## 2021-05-21 MED ORDER — TIZANIDINE HCL 4 MG PO TABS: 4.0000 mg | ORAL_TABLET | Freq: Three times a day (TID) | ORAL | 0 refills | Status: DC | PRN

## 2021-05-21 MED ORDER — KETOROLAC TROMETHAMINE 15 MG/ML IJ SOLN
15.0000 mg | Freq: Four times a day (QID) | INTRAMUSCULAR | Status: DC
Start: 2021-05-21 — End: 2021-05-21
  Administered 2021-05-21 (×2): 15 mg via INTRAVENOUS
  Filled 2021-05-21 (×2): qty 1

## 2021-05-21 MED ORDER — TIZANIDINE HCL 4 MG PO TABS
4.0000 mg | ORAL_TABLET | Freq: Three times a day (TID) | ORAL | Status: DC | PRN
  Administered 2021-05-21: 4 mg via ORAL
  Filled 2021-05-21: qty 1

## 2021-05-21 MED ORDER — OXYCODONE HCL 5 MG PO TABS: 5.0000 mg | ORAL_TABLET | ORAL | 0 refills | Status: DC | PRN

## 2021-05-21 MED ORDER — NICOTINE 14 MG/24HR TD PT24
14.0000 mg | MEDICATED_PATCH | Freq: Every day | TRANSDERMAL | Status: DC
  Administered 2021-05-21: 14 mg via TRANSDERMAL
  Filled 2021-05-21: qty 1

## 2021-05-21 NOTE — Discharge Instructions (Signed)

## 2021-05-21 NOTE — Progress Notes (Addendum)
Physical Therapy Treatment Patient Details Name: Brittany Hobbs MRN: [id number] DOB: Oct 23, 1960 Today's Date: 05/21/2021    History of Present Illness Patient 60 y.o. female s/p Rt TKA on 05/20/21 with PMH significant for OA, asthma, HLD, lupus, pani attacks, Covid PNA, Rt wrist fracture, Lt patella fracture.    PT Comments    Pt progressing with PT. Pt requires cues for safety with amb, mildly impulsive at times. Will need a second session to review  stairs. If has assist at home can d/c from PT standpoint  (pt states there is no one at home however she has 4 children and her sister is currently at her house taking care of her pets--??).   Follow Up Recommendations  Follow surgeon's recommendation for DC plan and follow-up therapies     Equipment Recommendations  Rolling walker with 5" wheels;3in1 (PT)    Recommendations for Other Services       Precautions / Restrictions Precautions Precautions: Fall;Knee Required Braces or Orthoses: Knee Immobilizer - Right Knee Immobilizer - Right: Discontinue once straight leg raise with < 10 degree lag Restrictions Weight Bearing Restrictions: No Other Position/Activity Restrictions: WBAT    Mobility  Bed Mobility Overal bed mobility: Needs Assistance Bed Mobility: Supine to Sit;Sit to Supine     Supine to sit: Supervision;Min guard Sit to supine: Min assist   General bed mobility comments: cues for safety and techique, assist to lift RLE on to bed,limited self assist d/t pain    Transfers Overall transfer level: Needs assistance Equipment used: Rolling walker (2 wheeled) Transfers: Sit to/from Stand Sit to Stand: Min guard         General transfer comment: cues for safe hand placement with RW for power up. no overt LOB noted  Ambulation/Gait Ambulation/Gait assistance: Min guard Gait Distance (Feet): 100 Feet Assistive device: Rolling walker (2 wheeled) Gait Pattern/deviations: Step-to pattern;Decreased stance time  - right     General Gait Details: cues for pushing RW,  to maintain safe proximity to RW adn keep both hands on RW. one standing rest d/t pain   Stairs             Wheelchair Mobility    Modified Rankin (Stroke Patients Only)       Balance   Sitting-balance support: Feet supported Sitting balance-Leahy Scale: Good     Standing balance support: Bilateral upper extremity supported;During functional activity Standing balance-Leahy Scale: Fair                              Cognition Arousal/Alertness: Awake/alert Behavior During Therapy: WFL for tasks assessed/performed Overall Cognitive Status: Within Functional Limits for tasks assessed                                        Exercises Total Joint Exercises Ankle Circles/Pumps: AROM;Both;10 reps    General Comments        Pertinent Vitals/Pain Pain Assessment: 0-10 Pain Score: 5  Pain Location: Rt knee Pain Descriptors / Indicators: Aching;Discomfort;Burning Pain Intervention(s): Limited activity within patient's tolerance;Monitored during session;Premedicated before session;Repositioned;Ice applied    Home Living                      Prior Function            PT Goals (current goals can now be found  in the care plan section) Acute Rehab PT Goals Patient Stated Goal: get back into garden PT Goal Formulation: With patient Time For Goal Achievement: 05/27/21 Potential to Achieve Goals: Good Progress towards PT goals: Progressing toward goals    Frequency    7X/week      PT Plan Current plan remains appropriate    Co-evaluation              AM-PAC PT "6 Clicks" Mobility   Outcome Measure  Help needed turning from your back to your side while in a flat bed without using bedrails?: None Help needed moving from lying on your back to sitting on the side of a flat bed without using bedrails?: A Little Help needed moving to and from a bed to a chair  (including a wheelchair)?: A Little Help needed standing up from a chair using your arms (e.g., wheelchair or bedside chair)?: A Little Help needed to walk in hospital room?: A Little Help needed climbing 3-5 steps with a railing? : A Little 6 Click Score: 19    End of Session Equipment Utilized During Treatment: Gait belt;Right knee immobilizer Activity Tolerance: Patient tolerated treatment well Patient left: with call bell/phone within reach;in bed;with bed alarm set Nurse Communication: Mobility status PT Visit Diagnosis: Muscle weakness (generalized) (M62.81);Difficulty in walking, not elsewhere classified (R26.2)     Time: [id number] PT Time Calculation (min) (ACUTE ONLY): 21 min  Charges:  $Gait Training: 8-22 mins                     Baxter Flattery, PT  Acute Rehab Dept ([place]) [phone] Pager [phone]  05/21/2021    [facility] 05/21/2021, 10:53 AM

## 2021-05-21 NOTE — TOC Transition Note (Signed)
Transition of Care [facility]) - CM/SW Discharge Note   Patient Details  Name: Brittany Hobbs MRN: [id number] Date of Birth: April 26, 1961  Transition of Care [facility]) CM/SW Contact:  Lennart Pall, LCSW Phone Number: 05/21/2021, 1:33 PM   Clinical Narrative:    Met with pt today and confirming she has all needed DME at home.  Plan for HHPT via [place] which was prearranged via MD office.  No further TOC needs.   Final next level of care: Home w Home Health Services Barriers to Discharge: No Barriers Identified   Patient Goals and CMS Choice Patient states their goals for this hospitalization and ongoing recovery are:: return home      Discharge Placement                       Discharge Plan and Services                DME Arranged: N/A DME Agency: NA                  Social Determinants of Health (SDOH) Interventions     Readmission Risk Interventions No flowsheet data found.

## 2021-05-21 NOTE — Progress Notes (Signed)
Subjective: 1 Day Post-Op Procedure(s) (LRB): RIGHT TOTAL KNEE ARTHROPLASTY (Right) Patient reports pain as severe.    Objective: Vital signs in last 24 hours: Temp:  [97.4 F (36.3 C)-98.6 F (37 C)] 98 F (36.7 C) (09/03 1007) Pulse Rate:  [73-89] 87 (09/03 1007) Resp:  [16-18] 17 (09/03 1007) BP: (106-169)/(57-82) 169/78 (09/03 1007) SpO2:  [93 %-100 %] 94 % (09/03 1007) Weight:  [68 kg] 68 kg (09/02 1229)  Intake/Output from previous day: 09/02 0701 - 09/03 0700 In: 4275.8 [P.O.:870; I.V.:3305.8; IV Piggyback:100] Out: 1525 [Urine:1500; Blood:25] Intake/Output this shift: Total I/O In: 540 [P.O.:240; I.V.:300] Out: 350 [Urine:350]  Recent Labs    05/21/21 0302  HGB 12.1   Recent Labs    05/21/21 0302  WBC 9.7  RBC 4.29  HCT 38.0  PLT 223   Recent Labs    05/21/21 0302  NA 138  K 3.6  CL 107  CO2 28  BUN 26*  CREATININE 0.66  GLUCOSE 108*  CALCIUM 8.2*   No results for input(s): LABPT, INR in the last 72 hours.  Sensation intact distally Intact pulses distally Dorsiflexion/Plantar flexion intact Incision: scant drainage No cellulitis present Compartment soft   Assessment/Plan: 1 Day Post-Op Procedure(s) (LRB): RIGHT TOTAL KNEE ARTHROPLASTY (Right) Up with therapy Plan for discharge tomorrow Discharge home with home health  Needs better pain control today - will try a few doses of Toradol    Mcarthur Rossetti 05/21/2021, 12:15 PM

## 2021-05-21 NOTE — Progress Notes (Signed)
05/21/21 1600  PT Visit Information  Last PT Received On 05/21/21  Assistance Needed +1  Pt progressing. Reviewed stairs, pt did well. Pt reports she will have assist at home. Ready for d/c from PT standpoint with family assist as needed   History of Present Illness Patient 60 y.o. female s/p Rt TKA on 05/20/21 with PMH significant for OA, asthma, HLD, lupus, pani attacks, Covid PNA, Rt wrist fracture, Lt patella fracture.  Subjective Data  Patient Stated Goal get back into garden  Precautions  Precautions Fall;Knee  Required Braces or Orthoses Knee Immobilizer - Right  Knee Immobilizer - Right Discontinue once straight leg raise with < 10 degree lag  Restrictions  Other Position/Activity Restrictions WBAT  Pain Assessment  Pain Assessment 0-10  Pain Score 5  Pain Location Rt knee  Pain Descriptors / Indicators Aching;Discomfort;Burning  Pain Intervention(s) Limited activity within patient's tolerance;Monitored during session;Premedicated before session;Repositioned;Ice applied  Cognition  Arousal/Alertness Awake/alert  Behavior During Therapy WFL for tasks assessed/performed  Overall Cognitive Status Within Functional Limits for tasks assessed  Bed Mobility  Overal bed mobility Needs Assistance  Bed Mobility Sit to Supine  Sit to supine Min assist  General bed mobility comments cues for safety and techique, pt able to use gait belt as leg lifter  Transfers  Overall transfer level Needs assistance  Equipment used Rolling walker (2 wheeled)  Transfers Sit to/from Stand  Sit to Stand Min guard  General transfer comment cues for safe hand placement with RW for power up.  Ambulation/Gait  Ambulation/Gait assistance Min guard  Gait Distance (Feet) 30 Feet  Assistive device Rolling walker (2 wheeled)  Gait Pattern/deviations Step-to pattern;Decreased stance time - right  General Gait Details cues for sequence  Stairs Yes  Stairs assistance Min guard  Stair Management Two  rails;Forwards  Number of Stairs 3  General stair comments cues for sequence and technique, min/guard for safety  Balance  Sitting-balance support Feet supported  Sitting balance-Leahy Scale Good  Standing balance support Bilateral upper extremity supported;During functional activity  Standing balance-Leahy Scale Fair  Total Joint Exercises  Ankle Circles/Pumps AROM;Both;10 reps  PT - End of Session  Equipment Utilized During Treatment Gait belt;Right knee immobilizer  Activity Tolerance Patient tolerated treatment well  Patient left with call bell/phone within reach;in bed;with bed alarm set  Nurse Communication Mobility status   PT - Assessment/Plan  PT Plan Current plan remains appropriate  PT Visit Diagnosis Muscle weakness (generalized) (M62.81);Difficulty in walking, not elsewhere classified (R26.2)  PT Frequency (ACUTE ONLY) 7X/week  Follow Up Recommendations Follow surgeon's recommendation for DC plan and follow-up therapies  PT equipment Rolling walker with 5" wheels;3in1 (PT)  AM-PAC PT "6 Clicks" Mobility Outcome Measure (Version 2)  Help needed turning from your back to your side while in a flat bed without using bedrails? 4  Help needed moving from lying on your back to sitting on the side of a flat bed without using bedrails? 3  Help needed moving to and from a bed to a chair (including a wheelchair)? 3  Help needed standing up from a chair using your arms (e.g., wheelchair or bedside chair)? 3  Help needed to walk in hospital room? 3  Help needed climbing 3-5 steps with a railing?  3  6 Click Score 19  Consider Recommendation of Discharge To: Home with [facility]  PT Goal Progression  Progress towards PT goals Progressing toward goals  Acute Rehab PT Goals  PT Goal Formulation With patient  Time For Goal Achievement 05/27/21  Potential to Achieve Goals Good  PT Time Calculation  PT Start Time (ACUTE ONLY) 1603  PT Stop Time (ACUTE ONLY) 1620  PT Time Calculation (min)  (ACUTE ONLY) 17 min  PT General Charges  $$ ACUTE PT VISIT 1 Visit  PT Treatments  $Gait Training 8-22 mins

## 2021-05-21 NOTE — Progress Notes (Signed)
05/21/21 1500 05/21/21 1600  PT Visit Information  Assistance Needed +1  Pt found standing in bathroom with ipad in one hand and cigarette in the other. advised pt this was against hospital policy to smoke, cued pt to place her hands on RW. pt complied. Assisted pt back to bed; informed RN   --   History of Present Illness Brittany Hobbs 60 y.o. female s/p Rt TKA on 05/20/21 with PMH significant for OA, asthma, HLD, lupus, pani attacks, Covid PNA, Rt wrist fracture, Lt patella fracture.  --   Subjective Data  Brittany Hobbs Stated Goal get back into garden  --   Precautions  Precautions Fall;Knee  --   Required Braces or Orthoses Knee Immobilizer - Right  --   Knee Immobilizer - Right Discontinue once straight leg raise with < 10 degree lag  --   Restrictions  Other Position/Activity Restrictions WBAT  --   Pain Assessment  Pain Location Rt knee  --   Pain Descriptors / Indicators Aching;Discomfort;Burning  --   Cognition  Arousal/Alertness Awake/alert  --   Behavior During Therapy WFL for tasks assessed/performed  --   Overall Cognitive Status Within Functional Limits for tasks assessed  --   Bed Mobility  Overal bed mobility Needs Assistance  --   Bed Mobility Sit to Supine  --   Sit to supine Min assist  --   General bed mobility comments cues for safety and technique, assist to lift RLE on to bed,limited self assist d/t pain  --   Transfers  Overall transfer level Needs assistance  --   Equipment used Rolling walker (2 wheeled)  --   Transfers Sit to/from Stand  --   General transfer comment pt found standing in bathroom with ipad in one hand and cigarette in the other. advised pt this was against hospital policy, cued pt to place her hands on RW. pt complied. cues for safety with stand to sit, (back up to surface adn reach back for bed)  --   Ambulation/Gait  Ambulation/Gait assistance Min guard  --   Gait Distance (Feet) 12 Feet  --   Assistive device Rolling walker (2 wheeled)  --    Gait Pattern/deviations Step-to pattern;Decreased stance time - right  --   General Gait Details cues for pushing RW,  to maintain safe proximity to RW and keep both hands on RW.  --   Balance  Sitting-balance support Feet supported  --   Sitting balance-Leahy Scale Good  --   Standing balance support Bilateral upper extremity supported;During functional activity  --   Standing balance-Leahy Scale Fair  --   Total Joint Exercises  Ankle Circles/Pumps AROM;Both;10 reps  --   Quad Sets AROM;Strengthening;Both;10 reps  --   Heel Slides AROM;Right;5 reps;Limitations  --   Heel Slides Limitations pain  --   PT - End of Session  Equipment Utilized During Treatment Gait belt;Right knee immobilizer  --   Activity Tolerance Brittany Hobbs tolerated treatment well  --   Brittany Hobbs left with call bell/phone within reach;in bed;with bed alarm set  --   Nurse Communication Mobility status  --    PT - Assessment/Plan  PT Plan Current plan remains appropriate  --   PT Visit Diagnosis Muscle weakness (generalized) (M62.81);Difficulty in walking, not elsewhere classified (R26.2)  --   PT Frequency (ACUTE ONLY) 7X/week  --   Follow Up Recommendations Follow surgeon's recommendation for DC plan and follow-up therapies  --   PT equipment Rolling walker  with 5" wheels;3in1 (PT)  --   AM-PAC PT "6 Clicks" Mobility Outcome Measure (Version 2)  Help needed turning from your back to your side while in a flat bed without using bedrails? 4 4  Help needed moving from lying on your back to sitting on the side of a flat bed without using bedrails? 3 3  Help needed moving to and from a bed to a chair (including a wheelchair)? 3 3  Help needed standing up from a chair using your arms (e.g., wheelchair or bedside chair)? 3 3  Help needed to walk in hospital room? 3 3  Help needed climbing 3-5 steps with a railing?  '3 3  6 '$ Click Score 19 19  Consider Recommendation of Discharge To: Home with [place] with [facility]  PT Goal  Progression  Progress towards PT goals Progressing toward goals  --   Acute Rehab PT Goals  PT Goal Formulation With Brittany Hobbs  --   Time For Goal Achievement 05/27/21  --   Potential to Achieve Goals Good  --   PT Time Calculation  PT Start Time (ACUTE ONLY) 1440  --   PT Stop Time (ACUTE ONLY) 1458  --   PT Time Calculation (min) (ACUTE ONLY) 18 min  --   PT General Charges  $$ ACUTE PT VISIT 1 Visit  --   PT Treatments  $Gait Training 8-22 mins  --

## 2021-05-21 NOTE — Plan of Care (Signed)
  Problem: Education: Goal: Knowledge of the prescribed therapeutic regimen will improve Outcome: Progressing   Problem: Activity: Goal: Ability to avoid complications of mobility impairment will improve Outcome: Progressing   Problem: Pain Management: Goal: Pain level will decrease with appropriate interventions Outcome: Progressing   

## 2021-05-21 NOTE — Plan of Care (Signed)
Patient discharged.

## 2021-05-22 ENCOUNTER — Other Ambulatory Visit: Payer: Self-pay | Admitting: Specialist

## 2021-05-24 ENCOUNTER — Other Ambulatory Visit: Payer: Self-pay | Admitting: Orthopaedic Surgery

## 2021-05-24 ENCOUNTER — Encounter (HOSPITAL_COMMUNITY): Payer: Self-pay | Admitting: Orthopaedic Surgery

## 2021-05-24 ENCOUNTER — Encounter: Payer: Self-pay | Admitting: Orthopaedic Surgery

## 2021-05-24 ENCOUNTER — Telehealth: Payer: Self-pay | Admitting: Orthopaedic Surgery

## 2021-05-24 MED ORDER — ONDANSETRON 4 MG PO TBDP: 4.0000 mg | ORAL_TABLET | Freq: Three times a day (TID) | ORAL | 0 refills | Status: DC | PRN

## 2021-05-24 MED ORDER — HYDROCODONE-ACETAMINOPHEN 5-325 MG PO TABS: 1.0000 | ORAL_TABLET | Freq: Four times a day (QID) | ORAL | 0 refills | Status: DC | PRN

## 2021-05-24 NOTE — Telephone Encounter (Signed)
Pt states that the oxycodone is making her nauseous. She would like to know if she could get a different medication.   CB UT:[id number]

## 2021-05-24 NOTE — Telephone Encounter (Signed)
Patient aware this was called in for her  

## 2021-05-24 NOTE — Discharge Summary (Signed)
Patient ID: Brittany Hobbs MRN: [id number] DOB/AGE: 1961/02/03 60 y.o.  Admit date: 05/20/2021 Discharge date: 05/24/2021  Admission Diagnoses:  Principal Problem:   Unilateral primary osteoarthritis, right knee Active Problems:   Status post right knee replacement   Discharge Diagnoses:  Status post right total knee arthroplasty  Past Medical History:  Diagnosis Date   Arthritis    Asthma    Diverticulitis    Hyperlipidemia    Lupus ([place])    Panic attacks    Pneumonia 09/2020   with covid   PONV (postoperative nausea and vomiting)    Seasonal allergies     Surgeries: Procedure(s): RIGHT TOTAL KNEE ARTHROPLASTY on 05/20/2021   Consultants:   Discharged Condition: Improved  Hospital Course: Brittany Hobbs is an 60 y.o. female who was admitted 05/20/2021 for operative treatment ofUnilateral primary osteoarthritis, right knee. Patient has severe unremitting pain that affects sleep, daily activities, and work/hobbies. After pre-op clearance the patient was taken to the operating room on 05/20/2021 and underwent  Procedure(s): RIGHT TOTAL KNEE ARTHROPLASTY.    Patient was given perioperative antibiotics:  Anti-infectives (From admission, onward)    Start     Dose/Rate Route Frequency Ordered Stop   05/20/21 1330  ceFAZolin (ANCEF) IVPB 1 g/50 mL premix        1 g 100 mL/hr over 30 Minutes Intravenous Every 6 hours 05/20/21 1121 05/20/21 2022   05/20/21 0600  ceFAZolin (ANCEF) IVPB 2g/100 mL premix        2 g 200 mL/hr over 30 Minutes Intravenous On call to O.R. 05/20/21 [id number] 05/20/21 0736        Patient was given sequential compression devices, early ambulation, and chemoprophylaxis to prevent DVT.  Patient benefited maximally from hospital stay and there were no complications.    Recent vital signs: No data found.   Recent laboratory studies: No results for input(s): WBC, HGB, HCT, PLT, NA, K, CL, CO2, BUN, CREATININE, GLUCOSE, INR, CALCIUM in the last 72  hours.  Invalid input(s): PT, 2   Discharge Medications:   Allergies as of 05/21/2021       Reactions   Iodine Anaphylaxis   Pt states she is allergic to INTERNAL IODINE   Betadine [povidone Iodine] Hives   Methocarbamol Nausea Only   Felt nauseated and sick   Sulfa Antibiotics    N/V        Medication List     STOP taking these medications    aspirin EC 81 MG tablet Replaced by: aspirin 81 MG chewable tablet   HYDROcodone-acetaminophen 5-325 MG tablet Commonly known as: NORCO/VICODIN       TAKE these medications    albuterol 108 (90 Base) MCG/ACT inhaler Commonly known as: VENTOLIN HFA Inhale 2 puffs into the lungs every 6 (six) hours as needed for wheezing or shortness of breath.   aspirin 81 MG chewable tablet Chew 1 tablet (81 mg total) by mouth 2 (two) times daily. Replaces: aspirin EC 81 MG tablet   escitalopram 20 MG tablet Commonly known as: LEXAPRO Take 20 mg by mouth daily.   gabapentin 400 MG capsule Commonly known as: NEURONTIN Take 800 mg by mouth 3 (three) times daily.   LORazepam 0.5 MG tablet Commonly known as: ATIVAN Take 0.5-1 mg by mouth See admin instructions. 0.'5mg'$  in AM and '1mg'$  in evening   oxyCODONE 5 MG immediate release tablet Commonly known as: Oxy IR/ROXICODONE Take 1-2 tablets (5-10 mg total) by mouth every 4 (four) hours as  needed for moderate pain (pain score 4-6).   predniSONE 10 MG tablet Commonly known as: DELTASONE Take 10 mg by mouth daily with breakfast.   tacrolimus 0.03 % ointment Commonly known as: PROTOPIC Apply 1 application topically 2 (two) times daily.   tiZANidine 4 MG tablet Commonly known as: ZANAFLEX Take 1 tablet (4 mg total) by mouth every 8 (eight) hours as needed for muscle spasms.        Diagnostic Studies: DG Knee Right Port  Result Date: 05/20/2021 CLINICAL DATA:  Postop knee replacement. EXAM: PORTABLE RIGHT KNEE - 1-2 VIEW COMPARISON:  02/03/2021 FINDINGS: Two views study shows  tricompartmental knee replacement. No evidence for immediate hardware complication. Gas in the joint space and soft tissues is compatible with the immediate postoperative state. IMPRESSION: Status post right total knee replacement. No evidence for immediate hardware complication. Electronically Signed   By: Misty Stanley M.D.   On: 05/20/2021 10:24    Disposition: Discharge disposition: 01-Home or Self Care       Discharge Instructions     Call MD / Call 911   Complete by: As directed    If you experience chest pain or shortness of breath, CALL 911 and be transported to the hospital emergency room.  If you develope a fever above 101 F, pus (white drainage) or increased drainage or redness at the wound, or calf pain, call your surgeon's office.   Constipation Prevention   Complete by: As directed    Drink plenty of fluids.  Prune juice may be helpful.  You may use a stool softener, such as Colace (over the counter) 100 mg twice a day.  Use MiraLax (over the counter) for constipation as needed.   Diet - low sodium heart healthy   Complete by: As directed    Discharge instructions   Complete by: As directed    INSTRUCTIONS AFTER JOINT REPLACEMENT   o Remove items at home which could result in a fall. This includes throw rugs or furniture in walking pathways o ICE to the affected joint every three hours while awake for 30 minutes at a time, for at least the first 3-5 days, and then as needed for pain and swelling.  Continue to use ice for pain and swelling. You may notice swelling that will progress down to the foot and ankle.  This is normal after surgery.  Elevate your leg when you are not up walking on it.   o Continue to use the breathing machine you got in the hospital (incentive spirometer) which will help keep your temperature down.  It is common for your temperature to cycle up and down following surgery, especially at night when you are not up moving around and exerting yourself.  The  breathing machine keeps your lungs expanded and your temperature down.   DIET:  As you were doing prior to hospitalization, we recommend a well-balanced diet.  DRESSING / WOUND CARE / SHOWERING  Keep the surgical dressing until follow up.  The dressing is water proof, so you can shower without any extra covering.  IF THE DRESSING FALLS OFF or the wound gets wet inside, change the dressing with sterile gauze.  Please use good hand washing techniques before changing the dressing.  Do not use any lotions or creams on the incision until instructed by your surgeon.    ACTIVITY  o Increase activity slowly as tolerated, but follow the weight bearing instructions below.   o No driving for 6 weeks or until  further direction given by your physician.  You cannot drive while taking narcotics.  o No lifting or carrying greater than 10 lbs. until further directed by your surgeon. o Avoid periods of inactivity such as sitting longer than an hour when not asleep. This helps prevent blood clots.  o You may return to work once you are authorized by your doctor.     WEIGHT BEARING   Weight bearing as tolerated with assist device (walker, cane, etc) as directed, use it as long as suggested by your surgeon or therapist, typically at least 4-6 weeks.   EXERCISES  Results after joint replacement surgery are often greatly improved when you follow the exercise, range of motion and muscle strengthening exercises prescribed by your doctor. Safety measures are also important to protect the joint from further injury. Any time any of these exercises cause you to have increased pain or swelling, decrease what you are doing until you are comfortable again and then slowly increase them. If you have problems or questions, call your caregiver or physical therapist for advice.   Rehabilitation is important following a joint replacement. After just a few days of immobilization, the muscles of the leg can become weakened and  shrink (atrophy).  These exercises are designed to build up the tone and strength of the thigh and leg muscles and to improve motion. Often times heat used for twenty to thirty minutes before working out will loosen up your tissues and help with improving the range of motion but do not use heat for the first two weeks following surgery (sometimes heat can increase post-operative swelling).   These exercises can be done on a training (exercise) mat, on the floor, on a table or on a bed. Use whatever works the best and is most comfortable for you.    Use music or television while you are exercising so that the exercises are a pleasant break in your day. This will make your life better with the exercises acting as a break in your routine that you can look forward to.   Perform all exercises about fifteen times, three times per day or as directed.  You should exercise both the operative leg and the other leg as well.  Exercises include:    Quad Sets - Tighten up the muscle on the front of the thigh (Quad) and hold for 5-10 seconds.    Straight Leg Raises - With your knee straight (if you were given a brace, keep it on), lift the leg to 60 degrees, hold for 3 seconds, and slowly lower the leg.  Perform this exercise against resistance later as your leg gets stronger.   Leg Slides: Lying on your back, slowly slide your foot toward your buttocks, bending your knee up off the floor (only go as far as is comfortable). Then slowly slide your foot back down until your leg is flat on the floor again.   Angel Wings: Lying on your back spread your legs to the side as far apart as you can without causing discomfort.   Hamstring Strength:  Lying on your back, push your heel against the floor with your leg straight by tightening up the muscles of your buttocks.  Repeat, but this time bend your knee to a comfortable angle, and push your heel against the floor.  You may put a pillow under the heel to make it more  comfortable if necessary.   A rehabilitation program following joint replacement surgery can speed recovery and prevent re-injury  in the future due to weakened muscles. Contact your doctor or a physical therapist for more information on knee rehabilitation.    CONSTIPATION  Constipation is defined medically as fewer than three stools per week and severe constipation as less than one stool per week.  Even if you have a regular bowel pattern at home, your normal regimen is likely to be disrupted due to multiple reasons following surgery.  Combination of anesthesia, postoperative narcotics, change in appetite and fluid intake all can affect your bowels.   YOU MUST use at least one of the following options; they are listed in order of increasing strength to get the job done.  They are all available over the counter, and you may need to use some, POSSIBLY even all of these options:    Drink plenty of fluids (prune juice may be helpful) and high fiber foods Colace 100 mg by mouth twice a day  Senokot for constipation as directed and as needed Dulcolax (bisacodyl), take with full glass of water  Miralax (polyethylene glycol) once or twice a day as needed.  If you have tried all these things and are unable to have a bowel movement in the first 3-4 days after surgery call either your surgeon or your primary doctor.    If you experience loose stools or diarrhea, hold the medications until you stool forms back up.  If your symptoms do not get better within 1 week or if they get worse, check with your doctor.  If you experience "the worst abdominal pain ever" or develop nausea or vomiting, please contact the office immediately for further recommendations for treatment.   ITCHING:  If you experience itching with your medications, try taking only a single pain pill, or even half a pain pill at a time.  You can also use Benadryl over the counter for itching or also to help with sleep.   TED HOSE STOCKINGS:   Use stockings on both legs until for at least 2 weeks or as directed by physician office. They may be removed at night for sleeping.  MEDICATIONS:  See your medication summary on the "After Visit Summary" that nursing will review with you.  You may have some home medications which will be placed on hold until you complete the course of blood thinner medication.  It is important for you to complete the blood thinner medication as prescribed.  PRECAUTIONS:  If you experience chest pain or shortness of breath - call 911 immediately for transfer to the hospital emergency department.   If you develop a fever greater that 101 F, purulent drainage from wound, increased redness or drainage from wound, foul odor from the wound/dressing, or calf pain - CONTACT YOUR SURGEON.                                                   FOLLOW-UP APPOINTMENTS:  If you do not already have a post-op appointment, please call the office for an appointment to be seen by your surgeon.  Guidelines for how soon to be seen are listed in your "After Visit Summary", but are typically between 1-4 weeks after surgery.  OTHER INSTRUCTIONS:   Knee Replacement:  Do not place pillow under knee, focus on keeping the knee straight while resting. CPM instructions: 0-90 degrees, 2 hours in the morning, 2 hours in the afternoon,  and 2 hours in the evening. Place foam block, curve side up under heel at all times except when in CPM or when walking.  DO NOT modify, tear, cut, or change the foam block in any way.  POST-OPERATIVE OPIOID TAPER INSTRUCTIONS:  It is important to wean off of your opioid medication as soon as possible. If you do not need pain medication after your surgery it is ok to stop day one.  Opioids include:  o Codeine, Hydrocodone(Norco, Vicodin), Oxycodone(Percocet, oxycontin) and hydromorphone amongst others.   Long term and even short term use of opiods can cause:  o Increased pain  response  o Dependence  o Constipation  o Depression  o Respiratory depression  o And more.   Withdrawal symptoms can include  o Flu like symptoms  o Nausea, vomiting  o And more  Techniques to manage these symptoms  o Hydrate well  o Eat regular healthy meals  o Stay active  o Use relaxation techniques(deep breathing, meditating, yoga)  Do Not substitute Alcohol to help with tapering  If you have been on opioids for less than two weeks and do not have pain than it is ok to stop all together.   Plan to wean off of opioids  o This plan should start within one week post op of your joint replacement.  o Maintain the same interval or time between taking each dose and first decrease the dose.   o Cut the total daily intake of opioids by one tablet each day  o Next start to increase the time between doses.  o The last dose that should be eliminated is the evening dose.   MAKE SURE YOU:   Understand these instructions.   Get help right away if you are not doing well or get worse.    Thank you for letting us be a part of your medical care team.  It is a privilege we respect greatly.  We hope these instructions will help you stay on track for a fast and full recovery!     Dental Antibiotics:  In most cases prophylactic antibiotics for Dental procdeures after total joint surgery are not necessary.  Exceptions are as follows:  1. History of prior total joint infection  2. Severely immunocompromised (Organ Transplant, cancer chemotherapy, Rheumatoid biologic meds such as [place])  3. Poorly controlled diabetes (A1C &gt; 8.0, blood glucose over 200)  If you have one of these conditions, contact your surgeon for an antibiotic prescription, prior to your dental procedure.   Driving restrictions   Complete by: As directed    No driving for 3 weeks   Increase activity slowly as tolerated   Complete by: As directed    Lifting restrictions   Complete by: As directed    No  lifting for 6 weeks   Post-operative opioid taper instructions:   Complete by: As directed    POST-OPERATIVE OPIOID TAPER INSTRUCTIONS: It is important to wean off of your opioid medication as soon as possible. If you do not need pain medication after your surgery it is ok to stop day one. Opioids include: Codeine, Hydrocodone(Norco, Vicodin), Oxycodone(Percocet, oxycontin) and hydromorphone amongst others.  Long term and even short term use of opiods can cause: Increased pain response Dependence Constipation Depression Respiratory depression And more.  Withdrawal symptoms can include Flu like symptoms Nausea, vomiting And more Techniques to manage these symptoms Hydrate well Eat regular healthy meals Stay active Use relaxation techniques(deep breathing, meditating, yoga) Do Not substitute Alcohol  to help with tapering If you have been on opioids for less than two weeks and do not have pain than it is ok to stop all together.  Plan to wean off of opioids This plan should start within one week post op of your joint replacement. Maintain the same interval or time between taking each dose and first decrease the dose.  Cut the total daily intake of opioids by one tablet each day Next start to increase the time between doses. The last dose that should be eliminated is the evening dose.           Follow-up Information     Mcarthur Rossetti, MD Follow up in 2 week(s).   Specialty: Orthopedic Surgery Contact information: [place] [address] [place], [place] Follow up.   Specialty: [place] Why: to provide home health physical therapy Contact information: [place] [address] [phone]                  Signed: Erskine Emery 05/24/2021, 2:18 PM

## 2021-05-26 NOTE — Discharge Summary (Signed)
Patient ID: Brittany Hobbs MRN: [id number] DOB/AGE: 1961-09-17 60 y.o.  Admit date: 05/20/2021 Discharge date:  05/21/2021  Admission Diagnoses:  Principal Problem:   Unilateral primary osteoarthritis, right knee Active Problems:   Status post right knee replacement   Discharge Diagnoses:  Same  Past Medical History:  Diagnosis Date   Arthritis    Asthma    Diverticulitis    Hyperlipidemia    Lupus ([place])    Panic attacks    Pneumonia 09/2020   with covid   PONV (postoperative nausea and vomiting)    Seasonal allergies     Surgeries: Procedure(s): RIGHT TOTAL KNEE ARTHROPLASTY on 05/20/2021   Consultants:   Discharged Condition: Improved  Hospital Course: Brittany Hobbs is an 60 y.o. female who was admitted 05/20/2021 for operative treatment ofUnilateral primary osteoarthritis, right knee. Patient has severe unremitting pain that affects sleep, daily activities, and work/hobbies. After pre-op clearance the patient was taken to the operating room on 05/20/2021 and underwent  Procedure(s): RIGHT TOTAL KNEE ARTHROPLASTY.    Patient was given perioperative antibiotics:  Anti-infectives (From admission, onward)    Start     Dose/Rate Route Frequency Ordered Stop   05/20/21 1330  ceFAZolin (ANCEF) IVPB 1 g/50 mL premix        1 g 100 mL/hr over 30 Minutes Intravenous Every 6 hours 05/20/21 1121 05/20/21 2022   05/20/21 0600  ceFAZolin (ANCEF) IVPB 2g/100 mL premix        2 g 200 mL/hr over 30 Minutes Intravenous On call to O.R. 05/20/21 [id number] 05/20/21 0736        Patient was given sequential compression devices, early ambulation, and chemoprophylaxis to prevent DVT.  Patient benefited maximally from hospital stay and there were no complications.    Recent vital signs: No data found.   Recent laboratory studies: No results for input(s): WBC, HGB, HCT, PLT, NA, K, CL, CO2, BUN, CREATININE, GLUCOSE, INR, CALCIUM in the last 72 hours.  Invalid input(s): PT,  2   Discharge Medications:   Allergies as of 05/21/2021       Reactions   Iodine Anaphylaxis   Pt states she is allergic to INTERNAL IODINE   Betadine [povidone Iodine] Hives   Methocarbamol Nausea Only   Felt nauseated and sick   Sulfa Antibiotics    N/V        Medication List     STOP taking these medications    aspirin EC 81 MG tablet Replaced by: aspirin 81 MG chewable tablet   HYDROcodone-acetaminophen 5-325 MG tablet Commonly known as: NORCO/VICODIN       TAKE these medications    albuterol 108 (90 Base) MCG/ACT inhaler Commonly known as: VENTOLIN HFA Inhale 2 puffs into the lungs every 6 (six) hours as needed for wheezing or shortness of breath.   aspirin 81 MG chewable tablet Chew 1 tablet (81 mg total) by mouth 2 (two) times daily. Replaces: aspirin EC 81 MG tablet   escitalopram 20 MG tablet Commonly known as: LEXAPRO Take 20 mg by mouth daily.   gabapentin 400 MG capsule Commonly known as: NEURONTIN Take 800 mg by mouth 3 (three) times daily.   LORazepam 0.5 MG tablet Commonly known as: ATIVAN Take 0.5-1 mg by mouth See admin instructions. 0.'5mg'$  in AM and '1mg'$  in evening   oxyCODONE 5 MG immediate release tablet Commonly known as: Oxy IR/ROXICODONE Take 1-2 tablets (5-10 mg total) by mouth every 4 (four) hours as needed for moderate pain (  pain score 4-6).   predniSONE 10 MG tablet Commonly known as: DELTASONE Take 10 mg by mouth daily with breakfast.   tacrolimus 0.03 % ointment Commonly known as: PROTOPIC Apply 1 application topically 2 (two) times daily.   tiZANidine 4 MG tablet Commonly known as: ZANAFLEX Take 1 tablet (4 mg total) by mouth every 8 (eight) hours as needed for muscle spasms.        Diagnostic Studies: DG Knee Right Port  Result Date: 05/20/2021 CLINICAL DATA:  Postop knee replacement. EXAM: PORTABLE RIGHT KNEE - 1-2 VIEW COMPARISON:  02/03/2021 FINDINGS: Two views study shows tricompartmental knee replacement. No  evidence for immediate hardware complication. Gas in the joint space and soft tissues is compatible with the immediate postoperative state. IMPRESSION: Status post right total knee replacement. No evidence for immediate hardware complication. Electronically Signed   By: Misty Stanley M.D.   On: 05/20/2021 10:24    Disposition: Discharge disposition: 01-Home or Self Care       Discharge Instructions     Call MD / Call 911   Complete by: As directed    If you experience chest pain or shortness of breath, CALL 911 and be transported to the hospital emergency room.  If you develope a fever above 101 F, pus (white drainage) or increased drainage or redness at the wound, or calf pain, call your surgeon's office.   Constipation Prevention   Complete by: As directed    Drink plenty of fluids.  Prune juice may be helpful.  You may use a stool softener, such as Colace (over the counter) 100 mg twice a day.  Use MiraLax (over the counter) for constipation as needed.   Diet - low sodium heart healthy   Complete by: As directed    Discharge instructions   Complete by: As directed    INSTRUCTIONS AFTER JOINT REPLACEMENT   o Remove items at home which could result in a fall. This includes throw rugs or furniture in walking pathways o ICE to the affected joint every three hours while awake for 30 minutes at a time, for at least the first 3-5 days, and then as needed for pain and swelling.  Continue to use ice for pain and swelling. You may notice swelling that will progress down to the foot and ankle.  This is normal after surgery.  Elevate your leg when you are not up walking on it.   o Continue to use the breathing machine you got in the hospital (incentive spirometer) which will help keep your temperature down.  It is common for your temperature to cycle up and down following surgery, especially at night when you are not up moving around and exerting yourself.  The breathing machine keeps your lungs  expanded and your temperature down.   DIET:  As you were doing prior to hospitalization, we recommend a well-balanced diet.  DRESSING / WOUND CARE / SHOWERING  Keep the surgical dressing until follow up.  The dressing is water proof, so you can shower without any extra covering.  IF THE DRESSING FALLS OFF or the wound gets wet inside, change the dressing with sterile gauze.  Please use good hand washing techniques before changing the dressing.  Do not use any lotions or creams on the incision until instructed by your surgeon.    ACTIVITY  o Increase activity slowly as tolerated, but follow the weight bearing instructions below.   o No driving for 6 weeks or until further direction given by  your physician.  You cannot drive while taking narcotics.  o No lifting or carrying greater than 10 lbs. until further directed by your surgeon. o Avoid periods of inactivity such as sitting longer than an hour when not asleep. This helps prevent blood clots.  o You may return to work once you are authorized by your doctor.     WEIGHT BEARING   Weight bearing as tolerated with assist device (walker, cane, etc) as directed, use it as long as suggested by your surgeon or therapist, typically at least 4-6 weeks.   EXERCISES  Results after joint replacement surgery are often greatly improved when you follow the exercise, range of motion and muscle strengthening exercises prescribed by your doctor. Safety measures are also important to protect the joint from further injury. Any time any of these exercises cause you to have increased pain or swelling, decrease what you are doing until you are comfortable again and then slowly increase them. If you have problems or questions, call your caregiver or physical therapist for advice.   Rehabilitation is important following a joint replacement. After just a few days of immobilization, the muscles of the leg can become weakened and shrink (atrophy).  These exercises  are designed to build up the tone and strength of the thigh and leg muscles and to improve motion. Often times heat used for twenty to thirty minutes before working out will loosen up your tissues and help with improving the range of motion but do not use heat for the first two weeks following surgery (sometimes heat can increase post-operative swelling).   These exercises can be done on a training (exercise) mat, on the floor, on a table or on a bed. Use whatever works the best and is most comfortable for you.    Use music or television while you are exercising so that the exercises are a pleasant break in your day. This will make your life better with the exercises acting as a break in your routine that you can look forward to.   Perform all exercises about fifteen times, three times per day or as directed.  You should exercise both the operative leg and the other leg as well.  Exercises include:    Quad Sets - Tighten up the muscle on the front of the thigh (Quad) and hold for 5-10 seconds.    Straight Leg Raises - With your knee straight (if you were given a brace, keep it on), lift the leg to 60 degrees, hold for 3 seconds, and slowly lower the leg.  Perform this exercise against resistance later as your leg gets stronger.   Leg Slides: Lying on your back, slowly slide your foot toward your buttocks, bending your knee up off the floor (only go as far as is comfortable). Then slowly slide your foot back down until your leg is flat on the floor again.   Angel Wings: Lying on your back spread your legs to the side as far apart as you can without causing discomfort.   Hamstring Strength:  Lying on your back, push your heel against the floor with your leg straight by tightening up the muscles of your buttocks.  Repeat, but this time bend your knee to a comfortable angle, and push your heel against the floor.  You may put a pillow under the heel to make it more comfortable if necessary.   A  rehabilitation program following joint replacement surgery can speed recovery and prevent re-injury in the future due  to weakened muscles. Contact your doctor or a physical therapist for more information on knee rehabilitation.    CONSTIPATION  Constipation is defined medically as fewer than three stools per week and severe constipation as less than one stool per week.  Even if you have a regular bowel pattern at home, your normal regimen is likely to be disrupted due to multiple reasons following surgery.  Combination of anesthesia, postoperative narcotics, change in appetite and fluid intake all can affect your bowels.   YOU MUST use at least one of the following options; they are listed in order of increasing strength to get the job done.  They are all available over the counter, and you may need to use some, POSSIBLY even all of these options:    Drink plenty of fluids (prune juice may be helpful) and high fiber foods Colace 100 mg by mouth twice a day  Senokot for constipation as directed and as needed Dulcolax (bisacodyl), take with full glass of water  Miralax (polyethylene glycol) once or twice a day as needed.  If you have tried all these things and are unable to have a bowel movement in the first 3-4 days after surgery call either your surgeon or your primary doctor.    If you experience loose stools or diarrhea, hold the medications until you stool forms back up.  If your symptoms do not get better within 1 week or if they get worse, check with your doctor.  If you experience "the worst abdominal pain ever" or develop nausea or vomiting, please contact the office immediately for further recommendations for treatment.   ITCHING:  If you experience itching with your medications, try taking only a single pain pill, or even half a pain pill at a time.  You can also use Benadryl over the counter for itching or also to help with sleep.   TED HOSE STOCKINGS:  Use stockings on both legs until  for at least 2 weeks or as directed by physician office. They may be removed at night for sleeping.  MEDICATIONS:  See your medication summary on the "After Visit Summary" that nursing will review with you.  You may have some home medications which will be placed on hold until you complete the course of blood thinner medication.  It is important for you to complete the blood thinner medication as prescribed.  PRECAUTIONS:  If you experience chest pain or shortness of breath - call 911 immediately for transfer to the hospital emergency department.   If you develop a fever greater that 101 F, purulent drainage from wound, increased redness or drainage from wound, foul odor from the wound/dressing, or calf pain - CONTACT YOUR SURGEON.                                                   FOLLOW-UP APPOINTMENTS:  If you do not already have a post-op appointment, please call the office for an appointment to be seen by your surgeon.  Guidelines for how soon to be seen are listed in your "After Visit Summary", but are typically between 1-4 weeks after surgery.  OTHER INSTRUCTIONS:   Knee Replacement:  Do not place pillow under knee, focus on keeping the knee straight while resting. CPM instructions: 0-90 degrees, 2 hours in the morning, 2 hours in the afternoon, and 2 hours in  the evening. Place foam block, curve side up under heel at all times except when in CPM or when walking.  DO NOT modify, tear, cut, or change the foam block in any way.  POST-OPERATIVE OPIOID TAPER INSTRUCTIONS:  It is important to wean off of your opioid medication as soon as possible. If you do not need pain medication after your surgery it is ok to stop day one.  Opioids include:  o Codeine, Hydrocodone(Norco, Vicodin), Oxycodone(Percocet, oxycontin) and hydromorphone amongst others.   Long term and even short term use of opiods can cause:  o Increased pain response  o Dependence  o Constipation  o Depression  o Respiratory  depression  o And more.   Withdrawal symptoms can include  o Flu like symptoms  o Nausea, vomiting  o And more  Techniques to manage these symptoms  o Hydrate well  o Eat regular healthy meals  o Stay active  o Use relaxation techniques(deep breathing, meditating, yoga)  Do Not substitute Alcohol to help with tapering  If you have been on opioids for less than two weeks and do not have pain than it is ok to stop all together.   Plan to wean off of opioids  o This plan should start within one week post op of your joint replacement.  o Maintain the same interval or time between taking each dose and first decrease the dose.   o Cut the total daily intake of opioids by one tablet each day  o Next start to increase the time between doses.  o The last dose that should be eliminated is the evening dose.   MAKE SURE YOU:   Understand these instructions.   Get help right away if you are not doing well or get worse.    Thank you for letting us be a part of your medical care team.  It is a privilege we respect greatly.  We hope these instructions will help you stay on track for a fast and full recovery!     Dental Antibiotics:  In most cases prophylactic antibiotics for Dental procdeures after total joint surgery are not necessary.  Exceptions are as follows:  1. History of prior total joint infection  2. Severely immunocompromised (Organ Transplant, cancer chemotherapy, Rheumatoid biologic meds such as [place])  3. Poorly controlled diabetes (A1C &gt; 8.0, blood glucose over 200)  If you have one of these conditions, contact your surgeon for an antibiotic prescription, prior to your dental procedure.   Driving restrictions   Complete by: As directed    No driving for 3 weeks   Increase activity slowly as tolerated   Complete by: As directed    Lifting restrictions   Complete by: As directed    No lifting for 6 weeks   Post-operative opioid taper instructions:   Complete  by: As directed    POST-OPERATIVE OPIOID TAPER INSTRUCTIONS: It is important to wean off of your opioid medication as soon as possible. If you do not need pain medication after your surgery it is ok to stop day one. Opioids include: Codeine, Hydrocodone(Norco, Vicodin), Oxycodone(Percocet, oxycontin) and hydromorphone amongst others.  Long term and even short term use of opiods can cause: Increased pain response Dependence Constipation Depression Respiratory depression And more.  Withdrawal symptoms can include Flu like symptoms Nausea, vomiting And more Techniques to manage these symptoms Hydrate well Eat regular healthy meals Stay active Use relaxation techniques(deep breathing, meditating, yoga) Do Not substitute Alcohol to help with tapering  If you have been on opioids for less than two weeks and do not have pain than it is ok to stop all together.  Plan to wean off of opioids This plan should start within one week post op of your joint replacement. Maintain the same interval or time between taking each dose and first decrease the dose.  Cut the total daily intake of opioids by one tablet each day Next start to increase the time between doses. The last dose that should be eliminated is the evening dose.           Follow-up Information     Mcarthur Rossetti, MD Follow up in 2 week(s).   Specialty: Orthopedic Surgery Contact information: [place] [address] [place], [place] Follow up.   Specialty: [place] Why: to provide home health physical therapy Contact information: [address] [place] [address] [phone]                  Signed: Mcarthur Rossetti 05/26/2021, 12:52 PM

## 2021-05-27 ENCOUNTER — Other Ambulatory Visit: Payer: Self-pay | Admitting: Orthopaedic Surgery

## 2021-05-27 MED ORDER — HYDROCODONE-ACETAMINOPHEN 5-325 MG PO TABS: 1.0000 | ORAL_TABLET | Freq: Four times a day (QID) | ORAL | 0 refills | Status: DC | PRN

## 2021-05-27 NOTE — Telephone Encounter (Signed)
ok 

## 2021-05-30 ENCOUNTER — Other Ambulatory Visit: Payer: Self-pay | Admitting: Orthopaedic Surgery

## 2021-05-30 ENCOUNTER — Telehealth: Payer: Self-pay | Admitting: Orthopaedic Surgery

## 2021-05-30 MED ORDER — HYDROCODONE-ACETAMINOPHEN 5-325 MG PO TABS: 1.0000 | ORAL_TABLET | Freq: Four times a day (QID) | ORAL | 0 refills | Status: DC | PRN

## 2021-05-30 NOTE — Telephone Encounter (Signed)
ok 

## 2021-05-30 NOTE — Telephone Encounter (Signed)
Pt called requesting a call back from [facility]. Pt has medical questions. Please call pt at  [phone].

## 2021-05-30 NOTE — Telephone Encounter (Signed)
Patient aware of the below message  

## 2021-06-01 ENCOUNTER — Other Ambulatory Visit: Payer: Self-pay | Admitting: Orthopaedic Surgery

## 2021-06-01 MED ORDER — HYDROCODONE-ACETAMINOPHEN 5-325 MG PO TABS: 1.0000 | ORAL_TABLET | Freq: Four times a day (QID) | ORAL | 0 refills | Status: DC | PRN

## 2021-06-01 NOTE — Telephone Encounter (Signed)
ok 

## 2021-06-02 ENCOUNTER — Other Ambulatory Visit: Payer: Self-pay | Admitting: Orthopaedic Surgery

## 2021-06-02 ENCOUNTER — Encounter: Payer: Self-pay | Admitting: Orthopaedic Surgery

## 2021-06-02 ENCOUNTER — Other Ambulatory Visit: Payer: Self-pay

## 2021-06-02 ENCOUNTER — Ambulatory Visit (INDEPENDENT_AMBULATORY_CARE_PROVIDER_SITE_OTHER): Payer: BC Managed Care – PPO | Admitting: Orthopaedic Surgery

## 2021-06-02 ENCOUNTER — Telehealth: Payer: Self-pay | Admitting: Orthopaedic Surgery

## 2021-06-02 DIAGNOSIS — Z96651 Presence of right artificial knee joint: Secondary | ICD-10-CM

## 2021-06-02 MED ORDER — LINACLOTIDE 145 MCG PO CAPS: 145.0000 ug | ORAL_CAPSULE | Freq: Every day | ORAL | 1 refills | Status: DC

## 2021-06-02 NOTE — Telephone Encounter (Signed)
Pt called requesting a call back. Pt is asking what to take for her stomach pains/gas. Please call pt about this matter at [phone].

## 2021-06-02 NOTE — Telephone Encounter (Signed)
Pt portal message sent

## 2021-06-02 NOTE — Telephone Encounter (Signed)
I called and talked to the pt and she stated she had tried all this. She would like an rx for Linzess. Please advise

## 2021-06-02 NOTE — Telephone Encounter (Signed)
Please advise 

## 2021-06-02 NOTE — Progress Notes (Signed)
The patient is 2 weeks tomorrow status post a right total knee replacement.  Somehow she missed being on aspirin twice a day.  Fortunately her calf is soft.  There is foot swelling to be expected.  I showed her how to work on pumping her ankles back-and-forth to work on circulation blood flow.  She actually has excellent range of motion for her first visit.  Her extension is full and her flexion is to close to 100 degrees.  The knee feels stable.  There is swelling and bruising to be expected.  She did have mechanical fall yesterday but the knee feels stable to me and does not need x-rays today.  She said at home therapy is going to scan her home therapy since she cannot drive and she is a widower and lives alone.  I agree with this and already she is making great progress.  I would like to see her back in 4 weeks to see how she is doing overall but no x-rays are needed.  All questions and concerns were answered and addressed.

## 2021-06-03 ENCOUNTER — Encounter (HOSPITAL_COMMUNITY): Payer: Self-pay

## 2021-06-03 ENCOUNTER — Inpatient Hospital Stay (HOSPITAL_COMMUNITY)
Admission: EM | Admit: 2021-06-03 | Discharge: 2021-06-18 | DRG: 329 | Disposition: A | Discharge status: Home Health | Payer: BC Managed Care – PPO | Attending: Internal Medicine | Admitting: Internal Medicine

## 2021-06-03 ENCOUNTER — Inpatient Hospital Stay (HOSPITAL_COMMUNITY): Payer: BC Managed Care – PPO

## 2021-06-03 ENCOUNTER — Emergency Department (HOSPITAL_COMMUNITY): Payer: BC Managed Care – PPO

## 2021-06-03 DIAGNOSIS — Z7952 Long term (current) use of systemic steroids: Secondary | ICD-10-CM | POA: Diagnosis not present

## 2021-06-03 DIAGNOSIS — Z7982 Long term (current) use of aspirin: Secondary | ICD-10-CM | POA: Diagnosis not present

## 2021-06-03 DIAGNOSIS — R0600 Dyspnea, unspecified: Secondary | ICD-10-CM

## 2021-06-03 DIAGNOSIS — R1084 Generalized abdominal pain: Secondary | ICD-10-CM | POA: Diagnosis present

## 2021-06-03 DIAGNOSIS — Z96651 Presence of right artificial knee joint: Secondary | ICD-10-CM | POA: Diagnosis present

## 2021-06-03 DIAGNOSIS — M1990 Unspecified osteoarthritis, unspecified site: Secondary | ICD-10-CM | POA: Diagnosis present

## 2021-06-03 DIAGNOSIS — Z91041 Radiographic dye allergy status: Secondary | ICD-10-CM

## 2021-06-03 DIAGNOSIS — J9601 Acute respiratory failure with hypoxia: Secondary | ICD-10-CM

## 2021-06-03 DIAGNOSIS — Z79899 Other long term (current) drug therapy: Secondary | ICD-10-CM

## 2021-06-03 DIAGNOSIS — K5720 Diverticulitis of large intestine with perforation and abscess without bleeding: Secondary | ICD-10-CM | POA: Diagnosis present

## 2021-06-03 DIAGNOSIS — Z1611 Resistance to penicillins: Secondary | ICD-10-CM | POA: Diagnosis not present

## 2021-06-03 DIAGNOSIS — G2581 Restless legs syndrome: Secondary | ICD-10-CM | POA: Diagnosis present

## 2021-06-03 DIAGNOSIS — J45909 Unspecified asthma, uncomplicated: Secondary | ICD-10-CM | POA: Diagnosis present

## 2021-06-03 DIAGNOSIS — I10 Essential (primary) hypertension: Secondary | ICD-10-CM | POA: Diagnosis present

## 2021-06-03 DIAGNOSIS — I82401 Acute embolism and thrombosis of unspecified deep veins of right lower extremity: Secondary | ICD-10-CM

## 2021-06-03 DIAGNOSIS — G629 Polyneuropathy, unspecified: Secondary | ICD-10-CM | POA: Diagnosis present

## 2021-06-03 DIAGNOSIS — K5792 Diverticulitis of intestine, part unspecified, without perforation or abscess without bleeding: Secondary | ICD-10-CM | POA: Diagnosis present

## 2021-06-03 DIAGNOSIS — Z20822 Contact with and (suspected) exposure to covid-19: Secondary | ICD-10-CM | POA: Diagnosis present

## 2021-06-03 DIAGNOSIS — K651 Peritoneal abscess: Secondary | ICD-10-CM

## 2021-06-03 DIAGNOSIS — F17210 Nicotine dependence, cigarettes, uncomplicated: Secondary | ICD-10-CM | POA: Diagnosis present

## 2021-06-03 DIAGNOSIS — Z0189 Encounter for other specified special examinations: Secondary | ICD-10-CM

## 2021-06-03 DIAGNOSIS — T8131XA Disruption of external operation (surgical) wound, not elsewhere classified, initial encounter: Secondary | ICD-10-CM | POA: Diagnosis not present

## 2021-06-03 DIAGNOSIS — Z888 Allergy status to other drugs, medicaments and biological substances status: Secondary | ICD-10-CM

## 2021-06-03 DIAGNOSIS — M329 Systemic lupus erythematosus, unspecified: Secondary | ICD-10-CM | POA: Diagnosis present

## 2021-06-03 DIAGNOSIS — R451 Restlessness and agitation: Secondary | ICD-10-CM | POA: Diagnosis not present

## 2021-06-03 DIAGNOSIS — E876 Hypokalemia: Secondary | ICD-10-CM | POA: Diagnosis not present

## 2021-06-03 DIAGNOSIS — Z882 Allergy status to sulfonamides status: Secondary | ICD-10-CM | POA: Diagnosis not present

## 2021-06-03 DIAGNOSIS — F419 Anxiety disorder, unspecified: Secondary | ICD-10-CM | POA: Diagnosis present

## 2021-06-03 DIAGNOSIS — E785 Hyperlipidemia, unspecified: Secondary | ICD-10-CM | POA: Diagnosis present

## 2021-06-03 LAB — CBC WITH DIFFERENTIAL/PLATELET
Abs Immature Granulocytes: 0.03 10*3/uL (ref 0.00–0.07)
Basophils Absolute: 0 10*3/uL (ref 0.0–0.1)
Basophils Relative: 0 %
Eosinophils Absolute: 0.1 10*3/uL (ref 0.0–0.5)
Eosinophils Relative: 1 %
HCT: 42.3 % (ref 36.0–46.0)
Hemoglobin: 13.7 g/dL (ref 12.0–15.0)
Immature Granulocytes: 0 %
Lymphocytes Relative: 8 %
Lymphs Abs: 0.8 10*3/uL (ref 0.7–4.0)
MCH: 28.8 pg (ref 26.0–34.0)
MCHC: 32.4 g/dL (ref 30.0–36.0)
MCV: 88.9 fL (ref 80.0–100.0)
Monocytes Absolute: 0.3 10*3/uL (ref 0.1–1.0)
Monocytes Relative: 3 %
Neutro Abs: 8.9 10*3/uL — ABNORMAL HIGH (ref 1.7–7.7)
Neutrophils Relative %: 88 %
Platelets: 357 10*3/uL (ref 150–400)
RBC: 4.76 MIL/uL (ref 3.87–5.11)
RDW: 15.8 % — ABNORMAL HIGH (ref 11.5–15.5)
WBC: 10.1 10*3/uL (ref 4.0–10.5)
nRBC: 0 % (ref 0.0–0.2)

## 2021-06-03 LAB — COMPREHENSIVE METABOLIC PANEL
ALT: 14 U/L (ref 0–44)
AST: 24 U/L (ref 15–41)
Albumin: 3.8 g/dL (ref 3.5–5.0)
Alkaline Phosphatase: 120 U/L (ref 38–126)
Anion gap: 9 (ref 5–15)
BUN: 28 mg/dL — ABNORMAL HIGH (ref 6–20)
CO2: 25 mmol/L (ref 22–32)
Calcium: 9.6 mg/dL (ref 8.9–10.3)
Chloride: 109 mmol/L (ref 98–111)
Creatinine, Ser: 0.62 mg/dL (ref 0.44–1.00)
GFR, Estimated: >60 mL/min (ref >60)
Glucose, Bld: 107 mg/dL — ABNORMAL HIGH (ref 70–99)
Potassium: 4.1 mmol/L (ref 3.5–5.1)
Sodium: 143 mmol/L (ref 135–145)
Total Bilirubin: 1.4 mg/dL — ABNORMAL HIGH (ref 0.3–1.2)
Total Protein: 7.2 g/dL (ref 6.5–8.1)

## 2021-06-03 LAB — URINALYSIS, ROUTINE W REFLEX MICROSCOPIC
Bilirubin Urine: NEGATIVE
Glucose, UA: NEGATIVE mg/dL
Hgb urine dipstick: NEGATIVE
Ketones, ur: NEGATIVE mg/dL
Leukocytes,Ua: NEGATIVE
Nitrite: NEGATIVE
Protein, ur: NEGATIVE mg/dL
Specific Gravity, Urine: 1.015 (ref 1.005–1.030)
pH: 6 (ref 5.0–8.0)

## 2021-06-03 LAB — RESP PANEL BY RT-PCR (FLU A&B, COVID) ARPGX2
Influenza A by PCR: NEGATIVE
Influenza B by PCR: NEGATIVE
SARS Coronavirus 2 by RT PCR: NEGATIVE

## 2021-06-03 LAB — LIPASE, BLOOD: Lipase: 22 U/L (ref 11–51)

## 2021-06-03 IMAGING — CT CT ABD-PELV W/O CM
2 of 4 series · 16 of 46 positions shown, 18 images · non-contrast
Comparison: [DATE].

CLINICAL DATA: Acute generalized abdominal pain.

EXAM:
CT ABDOMEN AND PELVIS WITHOUT CONTRAST
TECHNIQUE: Multidetector CT imaging of the abdomen and pelvis was performed
following the standard protocol without IV contrast.

[Series 2: axial st · axial · 0.83mm/px · z∈[+1154,+1519]mm · 13 of 83 slices shown, 15 images]
[im 5/83  soft-tissue]
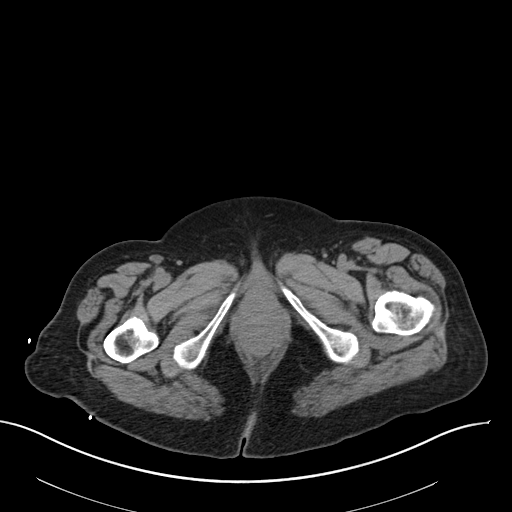
[im 5/83  bone]
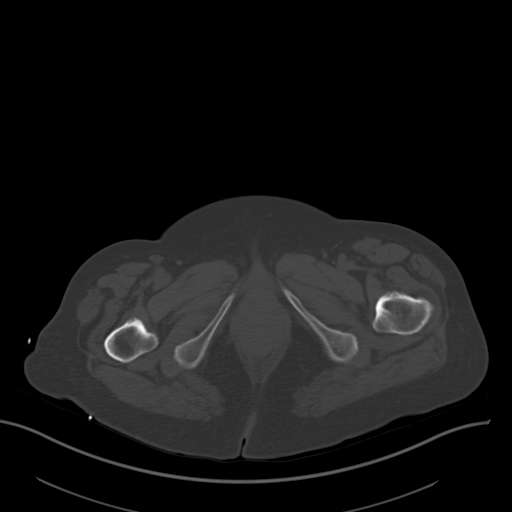
[im 10/83  soft-tissue]
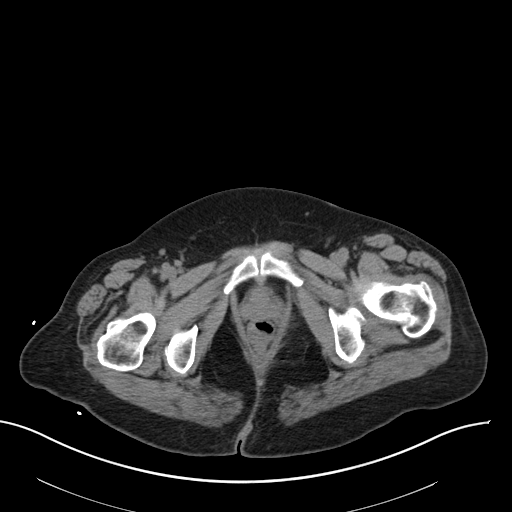
[im 19/83  soft-tissue]
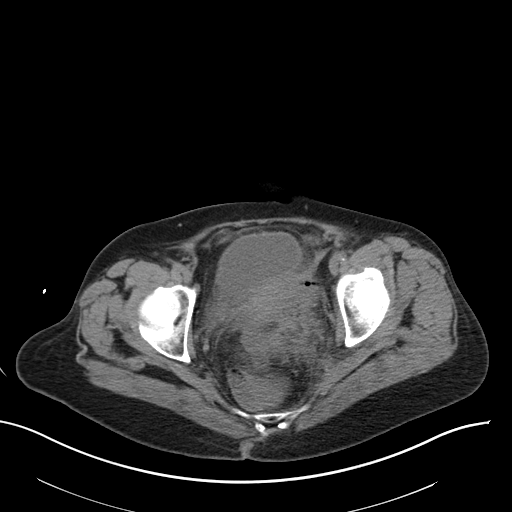
[im 23/83  soft-tissue]
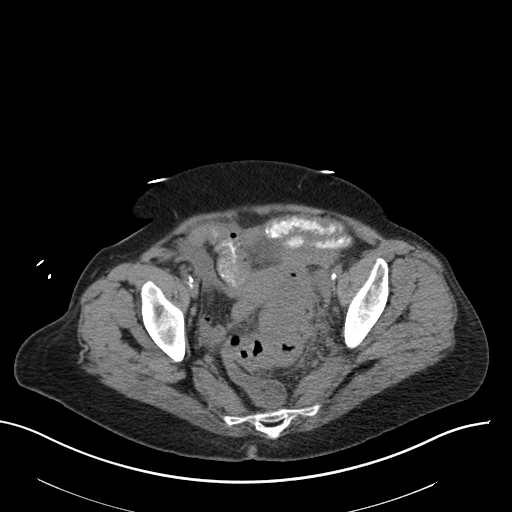
[im 28/83  soft-tissue]
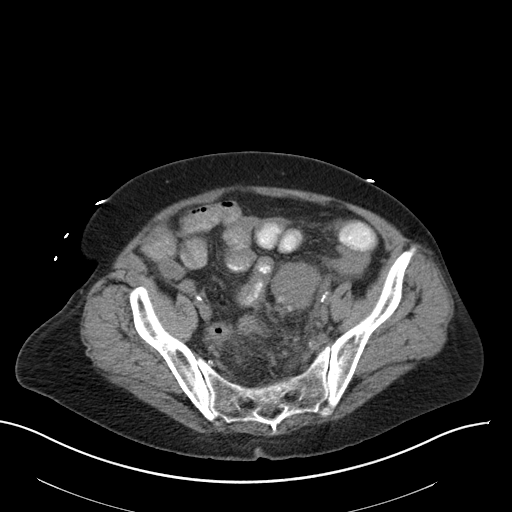
[im 37/83  soft-tissue]
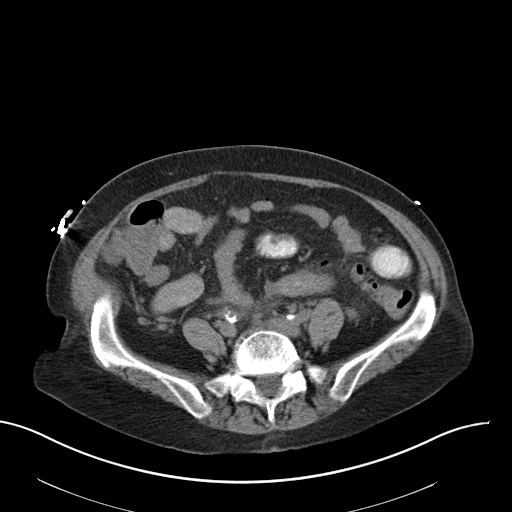
[im 42/83  soft-tissue]
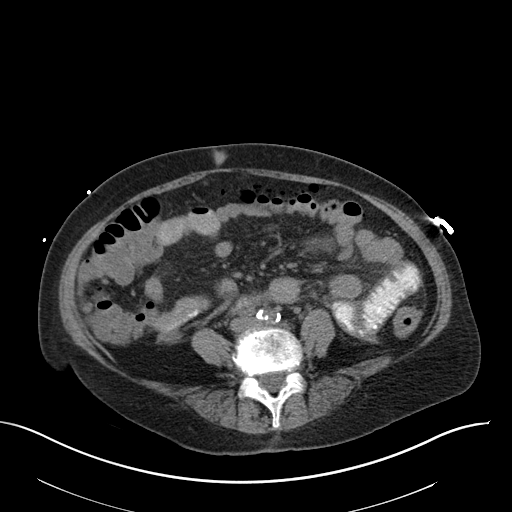
[im 46/83  soft-tissue]
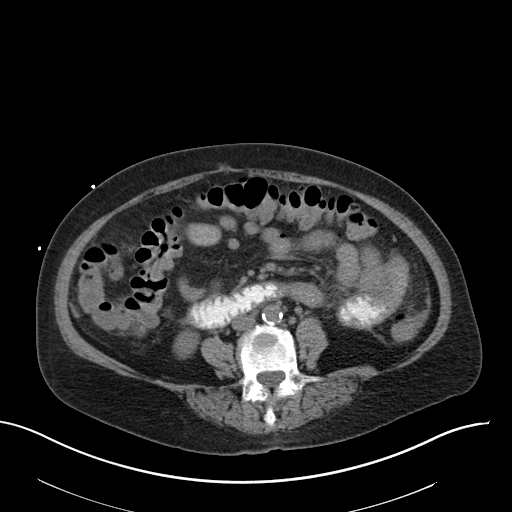
[im 55/83  soft-tissue]
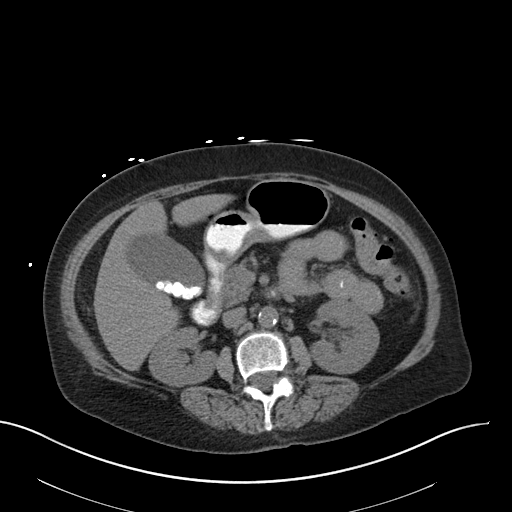
[im 55/83  bone]
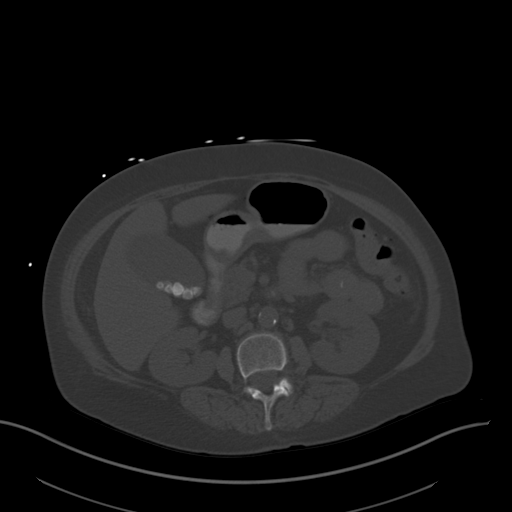
[im 60/83  soft-tissue]
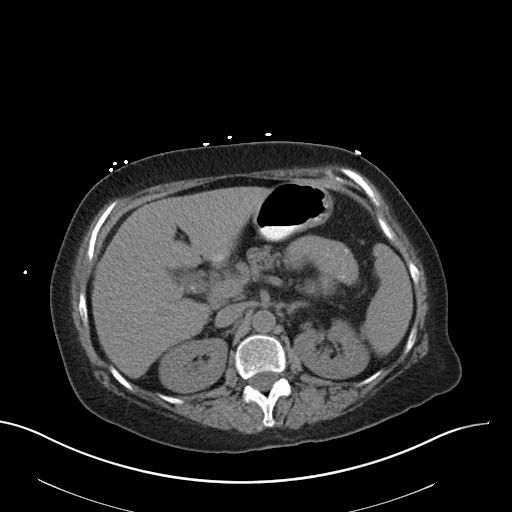
[im 64/83  soft-tissue]
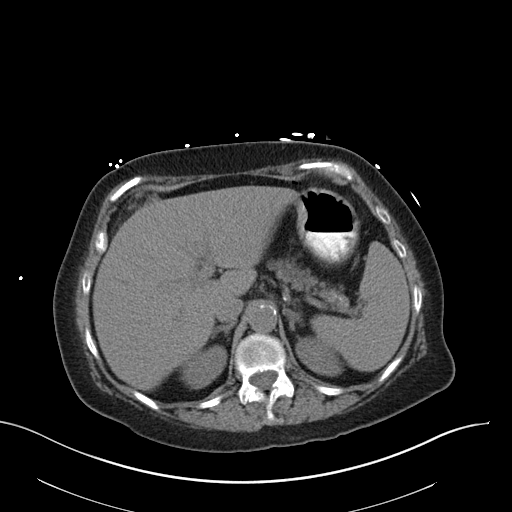
[im 73/83  soft-tissue]
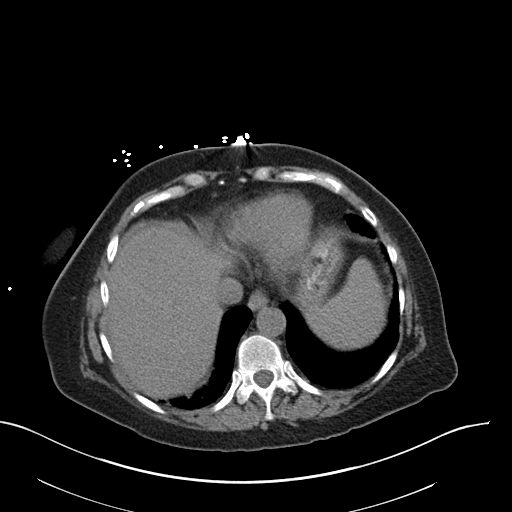
[im 78/83  soft-tissue]
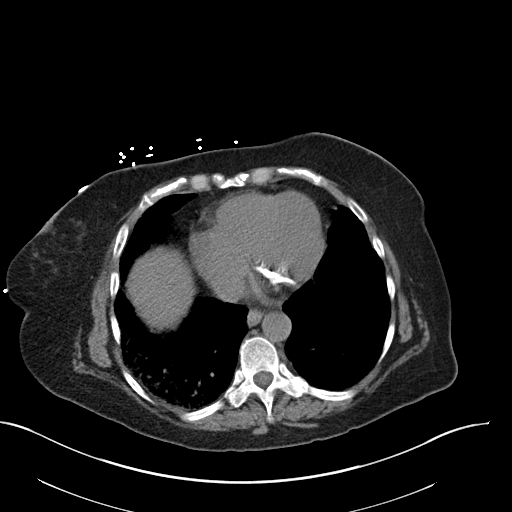

[Series 5: coronal st · coronal · 0.93mm/px · 3 of 118 slices shown]
[im 40/118  soft-tissue]
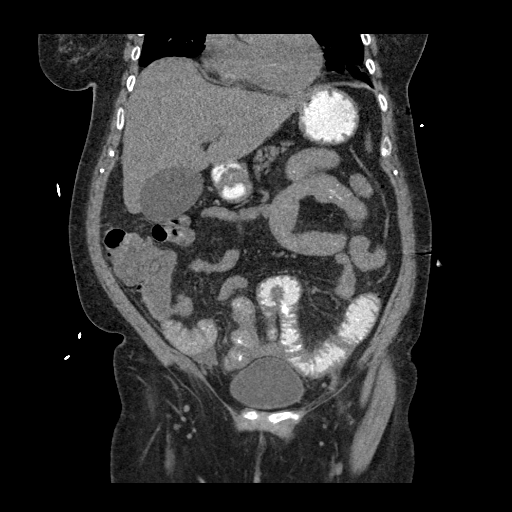
[im 53/118  soft-tissue]
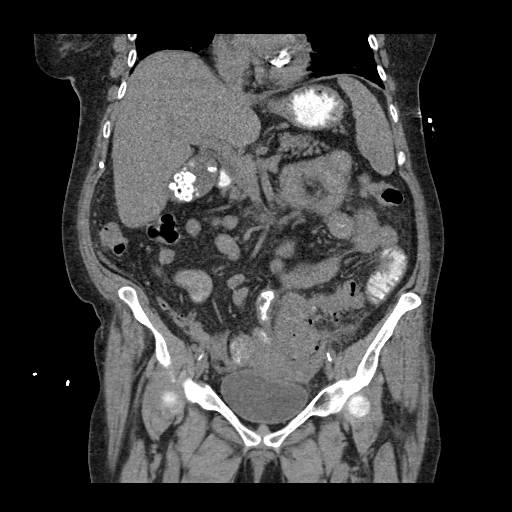
[im 66/118  soft-tissue]
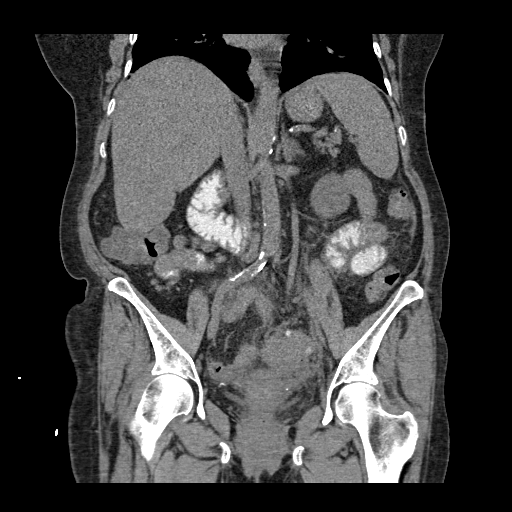

[16 of 46 positions shown; findings below may reference images not displayed]

FINDINGS: Lower chest: Mild right posterior basilar subsegmental atelectasis
is noted.

Hepatobiliary: Cholelithiasis is noted. No biliary dilatation is
noted. Liver is unremarkable.

Pancreas: Unremarkable. No pancreatic ductal dilatation or
surrounding inflammatory changes.

Spleen: Normal in size without focal abnormality.

Adrenals/Urinary Tract: Adrenal glands are unremarkable. Kidneys are
normal, without renal calculi, focal lesion, or hydronephrosis.
Bladder is unremarkable.

Stomach/Bowel: Stomach and appendix are unremarkable. There is no
evidence of bowel obstruction. There is again noted wall thickening
involving the sigmoid colon with surrounding inflammatory changes
concerning for sigmoid diverticulitis. There is an adjacent
crescent-shaped air collection concerning for perforation. Also
noted is mild pneumoperitoneum in the anterior portion of the
central abdomen.

Vascular/Lymphatic: Aortic atherosclerosis. No enlarged abdominal or
pelvic lymph nodes.

Reproductive: Uterus and bilateral adnexa are unremarkable.

Other: No abdominal wall hernia or abnormality. No abdominopelvic
ascites.

Musculoskeletal: No acute or significant osseous findings.
IMPRESSION: Moderate to severe sigmoid diverticulitis is noted with adjacent
crescent-shaped air collection concerning for perforation. Also
noted is mild pneumoperitoneum in the anterior portion of the
central abdomen also suggesting perforation. Critical Value/emergent
results were called by telephone at the time of interpretation on
[DATE] at [DATE] to provider SG , who verbally
acknowledged these results.

Cholelithiasis.

Aortic Atherosclerosis ([EF]-[EF]).

## 2021-06-03 IMAGING — DX DG CHEST 1V PORT
1 series · 1 of 1 positions shown · non-contrast
Comparison: [DATE]

CLINICAL DATA: Diffuse abdominal pain, nausea, vomiting

EXAM:
PORTABLE CHEST 1 VIEW

[chest ap]
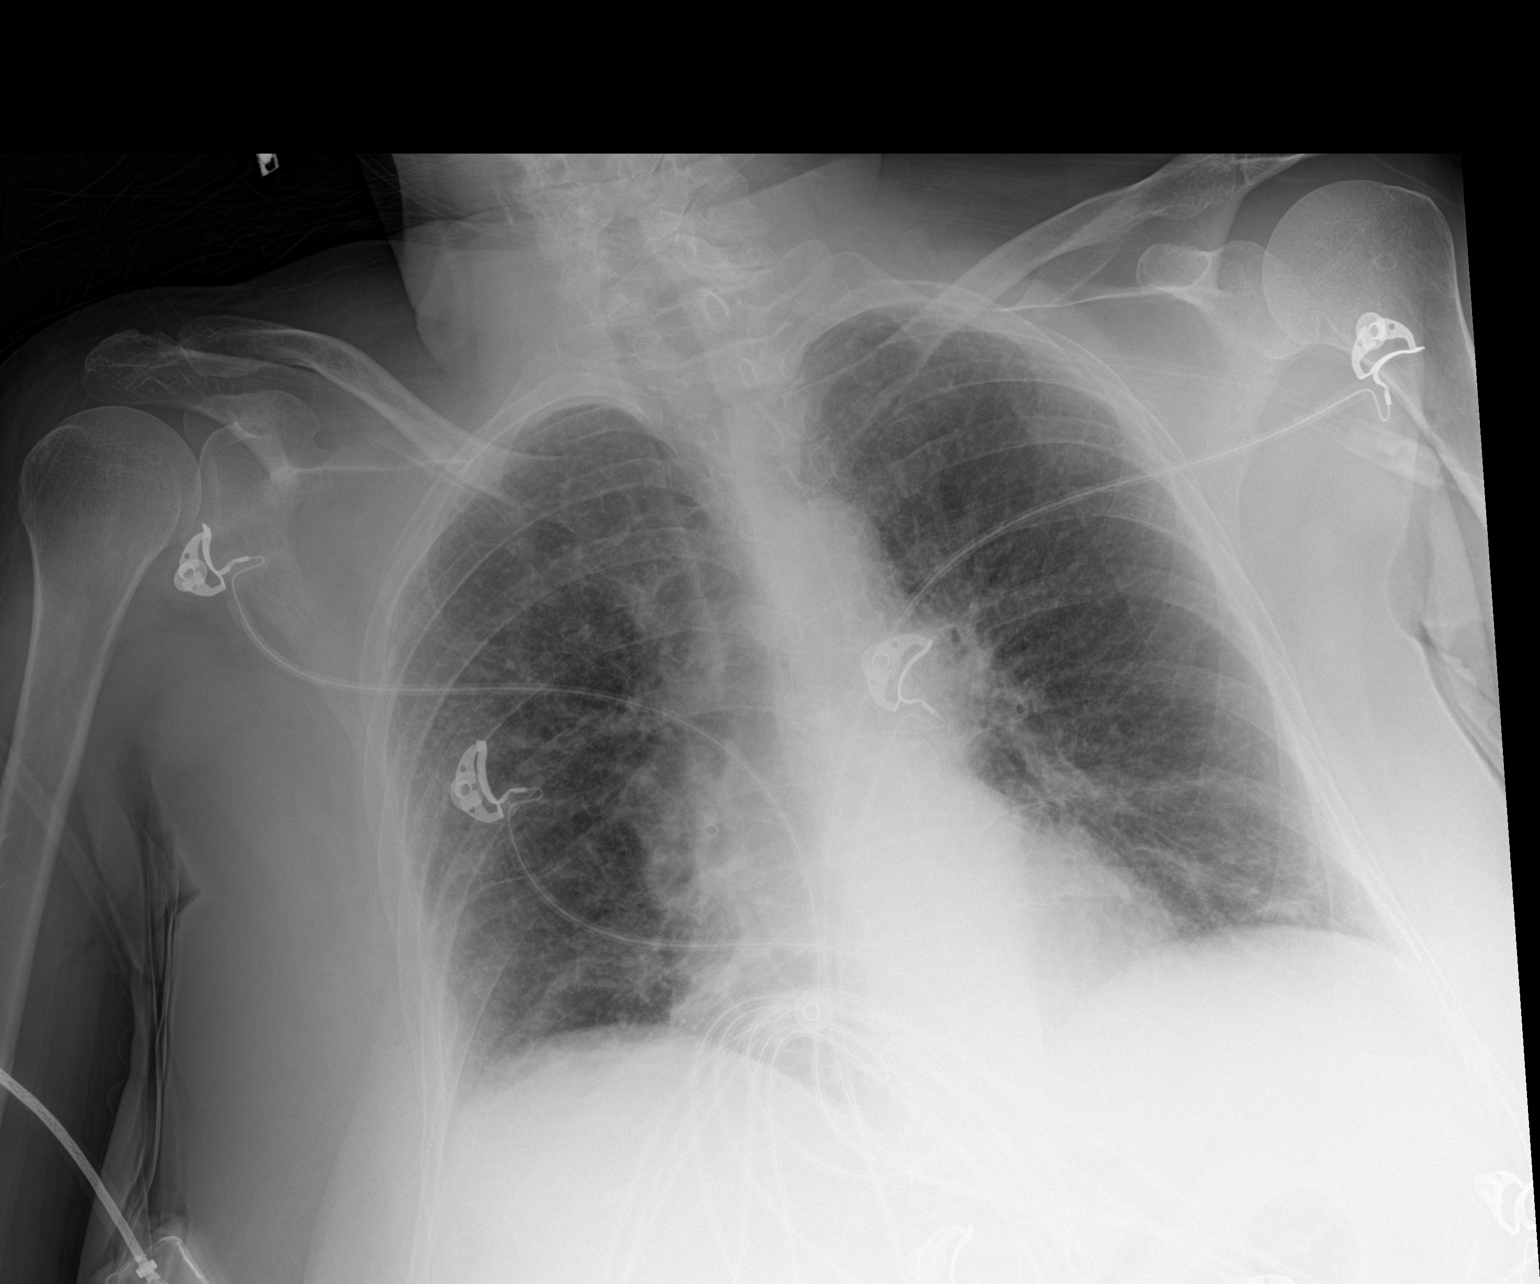

[1 of 1 positions shown; findings below may reference images not displayed]

FINDINGS: The heart size and mediastinal contours are within normal limits.
Mild, diffuse interstitial pulmonary opacity. The visualized
skeletal structures are unremarkable.
IMPRESSION: Mild, diffuse interstitial pulmonary opacity, which may reflect mild
edema or atypical/viral infection. No focal airspace opacity.

## 2021-06-03 MED ORDER — PIPERACILLIN-TAZOBACTAM 3.375 G IVPB
3.3750 g | Freq: Three times a day (TID) | INTRAVENOUS | Status: DC
  Administered 2021-06-03 – 2021-06-14 (×33): 3.375 g via INTRAVENOUS
  Filled 2021-06-03 (×33): qty 50

## 2021-06-03 MED ORDER — MORPHINE SULFATE (PF) 4 MG/ML IV SOLN
6.0000 mg | Freq: Once | INTRAVENOUS | Status: AC
  Administered 2021-06-03: 6 mg via INTRAVENOUS
  Filled 2021-06-03: qty 2

## 2021-06-03 MED ORDER — ENOXAPARIN SODIUM 40 MG/0.4ML IJ SOSY
40.0000 mg | PREFILLED_SYRINGE | INTRAMUSCULAR | Status: DC
  Administered 2021-06-03 – 2021-06-17 (×15): 40 mg via SUBCUTANEOUS
  Filled 2021-06-03 (×15): qty 0.4

## 2021-06-03 MED ORDER — METOCLOPRAMIDE HCL 5 MG/ML IJ SOLN
5.0000 mg | Freq: Once | INTRAMUSCULAR | Status: AC
  Administered 2021-06-03: 5 mg via INTRAVENOUS
  Filled 2021-06-03: qty 2

## 2021-06-03 MED ORDER — ONDANSETRON HCL 4 MG/2ML IJ SOLN
4.0000 mg | Freq: Four times a day (QID) | INTRAMUSCULAR | Status: DC | PRN
  Administered 2021-06-04 – 2021-06-16 (×6): 4 mg via INTRAVENOUS
  Filled 2021-06-03 (×7): qty 2

## 2021-06-03 MED ORDER — MORPHINE SULFATE (PF) 4 MG/ML IV SOLN
6.0000 mg | Freq: Once | INTRAVENOUS | Status: DC
Start: 2021-06-03 — End: 2021-06-03

## 2021-06-03 MED ORDER — GABAPENTIN 400 MG PO CAPS
800.0000 mg | ORAL_CAPSULE | Freq: Three times a day (TID) | ORAL | Status: DC
  Administered 2021-06-04 – 2021-06-11 (×20): 800 mg via ORAL
  Filled 2021-06-03 (×21): qty 2

## 2021-06-03 MED ORDER — MORPHINE SULFATE (PF) 4 MG/ML IV SOLN
4.0000 mg | INTRAVENOUS | Status: DC | PRN
  Administered 2021-06-04 – 2021-06-09 (×4): 4 mg via INTRAVENOUS
  Filled 2021-06-03 (×6): qty 1

## 2021-06-03 MED ORDER — LACTATED RINGERS IV BOLUS
2000.0000 mL | Freq: Once | INTRAVENOUS | Status: AC
  Administered 2021-06-03: 2000 mL via INTRAVENOUS

## 2021-06-03 MED ORDER — HYDROMORPHONE HCL 1 MG/ML IJ SOLN
0.5000 mg | INTRAMUSCULAR | Status: DC | PRN
  Administered 2021-06-03 – 2021-06-10 (×34): 1 mg via INTRAVENOUS
  Filled 2021-06-03 (×37): qty 1

## 2021-06-03 MED ORDER — PIPERACILLIN-TAZOBACTAM 3.375 G IVPB 30 MIN
3.3750 g | Freq: Once | INTRAVENOUS | Status: AC
  Administered 2021-06-03: 3.375 g via INTRAVENOUS
  Filled 2021-06-03: qty 50

## 2021-06-03 MED ORDER — ACETAMINOPHEN 500 MG PO TABS
1000.0000 mg | ORAL_TABLET | Freq: Four times a day (QID) | ORAL | Status: DC
  Administered 2021-06-04 – 2021-06-11 (×21): 1000 mg via ORAL
  Filled 2021-06-03 (×23): qty 2

## 2021-06-03 MED ORDER — ONDANSETRON HCL 4 MG PO TABS
4.0000 mg | ORAL_TABLET | Freq: Four times a day (QID) | ORAL | Status: DC | PRN
  Administered 2021-06-05 (×2): 4 mg via ORAL
  Filled 2021-06-03 (×2): qty 1

## 2021-06-03 MED ORDER — LACTATED RINGERS IV SOLN: INTRAVENOUS | Status: DC

## 2021-06-03 NOTE — Consult Note (Signed)
Brittany Hobbs [birth year]  [id number].    Requesting MD: Dr. Lacretia Leigh  Chief Complaint/Reason for Consult: diverticulitis  HPI:  This is a 60 year old female with a history of lupus (she states she does not take steroids every day), anxiety with panic attacks, tobacco abuse, asthma, arthritis status post recent total knee replacement 2 weeks ago, diverticulitis who just saw her orthopedist, Dr. Ninfa Linden, yesterday and complained of some constipation.  She was given a dose of Linzess.  She took this yesterday and was otherwise feeling normal.  She states in the middle of the night, she developed an acute onset of abdominal pain in her left lower quadrant and overall diffuse.  She denies any nausea or vomiting.  She does admit to some diarrhea which she attributes to the [place] she took yesterday.  She denies any fevers.  Nothing helped her pain at home.  She denies any chest pain, shortness of breath, dysuria, etc.  She presented to the [facility] emergency department today for evaluation secondary to her abdominal pain.  Her white blood cell count is normal and she is afebrile.  She does have a CT scan that reveals moderate to severe sigmoid diverticulitis with a concern for contained perforation.  She does have some mild pneumoperitoneum in the anterior portion of the central abdomen also consistent with likely a contained perforation.  There is no fluid collection consistent with abscess.  We have been asked to evaluate the patient for further recommendations.  Of note, she has been admitted to the hospital in 2019 with diverticulitis and abscess that was not amendable to drainage as well as in [id number] that was uncomplicated.  She did undergo a colonoscopy in 2020 by Dr. Fuller Plan that revealed moderate diverticulosis on the left side with mild diverticulosis on the right side of her colon.  No other significant findings were noted.  ROS: ROS: Please see HPI, otherwise all other systems have  been reviewed and are negative  Family History  Problem Relation Age of Onset   Colon cancer Neg Hx    Stomach cancer Neg Hx    Esophageal cancer Neg Hx    Rectal cancer Neg Hx     Past Medical History:  Diagnosis Date   Arthritis    Asthma    Diverticulitis    Hyperlipidemia    Lupus ([place])    Panic attacks    Pneumonia 09/2020   with covid   PONV (postoperative nausea and vomiting)    Seasonal allergies     Past Surgical History:  Procedure Laterality Date   PATELLA FRACTURE SURGERY Left    scar tissue eye  1983   right   TOTAL KNEE ARTHROPLASTY Right 05/20/2021   Procedure: RIGHT TOTAL KNEE ARTHROPLASTY;  Surgeon: Mcarthur Rossetti, MD;  Location: [facility];  Service: Orthopedics;  Laterality: Right;  Needs RNFA   TUBAL LIGATION  1985   WRIST FRACTURE SURGERY Right     Social History:  reports that she has been smoking cigarettes. She has a 9.00 pack-year smoking history. She has never used smokeless tobacco. She reports current drug use. Frequency: 7.00 times per week. Drug: Marijuana. She reports that she does not drink alcohol.  Allergies:  Allergies  Allergen Reactions   Iodine Anaphylaxis    Pt states she is allergic to INTERNAL IODINE   Betadine [Povidone Iodine] Hives   Methocarbamol Nausea Only    Felt nauseated and sick   Sulfa Antibiotics  N/V    (Not in a hospital admission)    Physical Exam: Blood pressure (!) 151/75, pulse (!) 107, temperature 99.7 F (37.6 C), temperature source Oral, resp. rate (!) 25, height '5\' 5"'$  (1.651 m), weight 64 kg, SpO2 91 %. General: pleasant, WD, WN white female who is laying in bed in mild distress secondary to pain HEENT: head is normocephalic, atraumatic.  Sclera are noninjected.  PERRL.  Ears and nose without any masses or lesions.  Mouth is pink and moist Heart: regular, rate, and rhythm.  Normal s1,s2. No obvious murmurs, gallops, or rubs noted.  Palpable radial and pedal pulses bilaterally Lungs: CTAB,  no wheezes, rhonchi, or rales noted.  Respiratory effort nonlabored Abd: soft, not really distended, but diffusely tender with voluntary guarding essentially throughout.  Hypoactive BS, no masses, hernias, or organomegaly MS: all 4 extremities are symmetrical with no cyanosis, clubbing, or edema, except her right lower extremity with a well-healing scar as well as some old ecchymosis and edema from recent surgery. Skin: warm and dry with no masses, lesions, or rashes Neuro: Cranial nerves 2-12 grossly intact, sensation is normal throughout Psych: A&Ox3 with an appropriate affect.   Results for orders placed or performed during the hospital encounter of 06/03/21 (from the past 48 hour(s))  CBC with Differential/Platelet     Status: Abnormal   Collection Time: 06/03/21  9:21 AM  Result Value Ref Range   WBC 10.1 4.0 - 10.5 K/uL   RBC 4.76 3.87 - 5.11 MIL/uL   Hemoglobin 13.7 12.0 - 15.0 g/dL   HCT 42.3 36.0 - 46.0 %   MCV 88.9 80.0 - 100.0 fL   MCH 28.8 26.0 - 34.0 pg   MCHC 32.4 30.0 - 36.0 g/dL   RDW 15.8 (H) 11.5 - 15.5 %   Platelets 357 150 - 400 K/uL   nRBC 0.0 0.0 - 0.2 %   Neutrophils Relative % 88 %   Neutro Abs 8.9 (H) 1.7 - 7.7 K/uL   Lymphocytes Relative 8 %   Lymphs Abs 0.8 0.7 - 4.0 K/uL   Monocytes Relative 3 %   Monocytes Absolute 0.3 0.1 - 1.0 K/uL   Eosinophils Relative 1 %   Eosinophils Absolute 0.1 0.0 - 0.5 K/uL   Basophils Relative 0 %   Basophils Absolute 0.0 0.0 - 0.1 K/uL   Immature Granulocytes 0 %   Abs Immature Granulocytes 0.03 0.00 - 0.07 K/uL    Comment: Performed at [facility], [place] [address], [place], [place] [address]  Comprehensive metabolic panel     Status: Abnormal   Collection Time: 06/03/21  9:21 AM  Result Value Ref Range   Sodium 143 135 - 145 mmol/L   Potassium 4.1 3.5 - 5.1 mmol/L   Chloride 109 98 - 111 mmol/L   CO2 25 22 - 32 mmol/L   Glucose, Bld 107 (H) 70 - 99 mg/dL    Comment: Glucose reference range  applies only to samples taken after fasting for at least 8 hours.   BUN 28 (H) 6 - 20 mg/dL   Creatinine, Ser 0.62 0.44 - 1.00 mg/dL   Calcium 9.6 8.9 - 10.3 mg/dL   Total Protein 7.2 6.5 - 8.1 g/dL   Albumin 3.8 3.5 - 5.0 g/dL   AST 24 15 - 41 U/L   ALT 14 0 - 44 U/L   Alkaline Phosphatase 120 38 - 126 U/L   Total Bilirubin 1.4 (H) 0.3 - 1.2 mg/dL   GFR,  Estimated >60 >60 mL/min    Comment: (NOTE) Calculated using the CKD-EPI Creatinine Equation (2021)    Anion gap 9 5 - 15    Comment: Performed at [facility], [place] [address] [place], [place] [address]  Lipase, blood     Status: None   Collection Time: 06/03/21  9:21 AM  Result Value Ref Range   Lipase 22 11 - 51 U/L    Comment: Performed at [facility], [place] [address] [place], [place] [address]  Resp Panel by RT-PCR (Flu A&B, Covid) Nasopharyngeal Swab     Status: None   Collection Time: 06/03/21  9:51 AM   Specimen: Nasopharyngeal Swab; Nasopharyngeal(NP) swabs in vial transport medium  Result Value Ref Range   SARS Coronavirus 2 by RT PCR NEGATIVE NEGATIVE    Comment: (NOTE) SARS-CoV-2 target nucleic acids are NOT DETECTED.  The SARS-CoV-2 RNA is generally detectable in upper respiratory specimens during the acute phase of infection. The lowest concentration of SARS-CoV-2 viral copies this assay can detect is 138 copies/mL. A negative result does not preclude SARS-Cov-2 infection and should not be used as the sole basis for treatment or other patient management decisions. A negative result may occur with  improper specimen collection/handling, submission of specimen other than nasopharyngeal swab, presence of viral mutation(s) within the areas targeted by this assay, and inadequate number of viral copies(<138 copies/mL). A negative result must be combined with clinical observations, patient history, and epidemiological information. The expected result is Negative.  Fact Sheet for  Patients:  EntrepreneurPulse.com.au  Fact Sheet for Healthcare Providers:  IncredibleEmployment.be  This test is no t yet approved or cleared by the [place] FDA and  has been authorized for detection and/or diagnosis of SARS-CoV-2 by FDA under an Emergency Use Authorization (EUA). This EUA will remain  in effect (meaning this test can be used) for the duration of the COVID-19 declaration under Section 564(b)(1) of the Act, 21 U.S.C.section 360bbb-3(b)(1), unless the authorization is terminated  or revoked sooner.       Influenza A by PCR NEGATIVE NEGATIVE   Influenza B by PCR NEGATIVE NEGATIVE    Comment: (NOTE) The Xpert Xpress SARS-CoV-2/FLU/RSV plus assay is intended as an aid in the diagnosis of influenza from Nasopharyngeal swab specimens and should not be used as a sole basis for treatment. Nasal washings and aspirates are unacceptable for Xpert Xpress SARS-CoV-2/FLU/RSV testing.  Fact Sheet for Patients: EntrepreneurPulse.com.au  Fact Sheet for Healthcare Providers: IncredibleEmployment.be  This test is not yet approved or cleared by the [place] FDA and has been authorized for detection and/or diagnosis of SARS-CoV-2 by FDA under an Emergency Use Authorization (EUA). This EUA will remain in effect (meaning this test can be used) for the duration of the COVID-19 declaration under Section 564(b)(1) of the Act, 21 U.S.C. section 360bbb-3(b)(1), unless the authorization is terminated or revoked.  Performed at [facility], [place] [address], [place], [place] [address]   Urinalysis, Routine w reflex microscopic     Status: None   Collection Time: 06/03/21  1:26 PM  Result Value Ref Range   Color, Urine YELLOW YELLOW   APPearance CLEAR CLEAR   Specific Gravity, Urine 1.015 1.005 - 1.030   pH 6.0 5.0 - 8.0   Glucose, UA NEGATIVE NEGATIVE mg/dL   Hgb urine dipstick  NEGATIVE NEGATIVE   Bilirubin Urine NEGATIVE NEGATIVE   Ketones, ur NEGATIVE NEGATIVE mg/dL   Protein, ur NEGATIVE NEGATIVE mg/dL  Nitrite NEGATIVE NEGATIVE   Leukocytes,Ua NEGATIVE NEGATIVE    Comment: Microscopic not done on urines with negative protein, blood, leukocytes, nitrite, or glucose < 500 mg/dL. Performed at [facility], [place] [address], [place], [place] [address]    CT Abdomen Pelvis Wo Contrast  Result Date: 06/03/2021 CLINICAL DATA:  Acute generalized abdominal pain. EXAM: CT ABDOMEN AND PELVIS WITHOUT CONTRAST TECHNIQUE: Multidetector CT imaging of the abdomen and pelvis was performed following the standard protocol without IV contrast. COMPARISON:  September 29, 2019. FINDINGS: Lower chest: Mild right posterior basilar subsegmental atelectasis is noted. Hepatobiliary: Cholelithiasis is noted. No biliary dilatation is noted. Liver is unremarkable. Pancreas: Unremarkable. No pancreatic ductal dilatation or surrounding inflammatory changes. Spleen: Normal in size without focal abnormality. Adrenals/Urinary Tract: Adrenal glands are unremarkable. Kidneys are normal, without renal calculi, focal lesion, or hydronephrosis. Bladder is unremarkable. Stomach/Bowel: Stomach and appendix are unremarkable. There is no evidence of bowel obstruction. There is again noted wall thickening involving the sigmoid colon with surrounding inflammatory changes concerning for sigmoid diverticulitis. There is an adjacent crescent-shaped air collection concerning for perforation. Also noted is mild pneumoperitoneum in the anterior portion of the central abdomen. Vascular/Lymphatic: Aortic atherosclerosis. No enlarged abdominal or pelvic lymph nodes. Reproductive: Uterus and bilateral adnexa are unremarkable. Other: No abdominal wall hernia or abnormality. No abdominopelvic ascites. Musculoskeletal: No acute or significant osseous findings. IMPRESSION: Moderate to severe sigmoid diverticulitis is  noted with adjacent crescent-shaped air collection concerning for perforation. Also noted is mild pneumoperitoneum in the anterior portion of the central abdomen also suggesting perforation. Critical Value/emergent results were called by telephone at the time of interpretation on 06/03/2021 at 12:33 pm to provider Lacretia Leigh , who verbally acknowledged these results. Cholelithiasis. Aortic Atherosclerosis (ICD10-I70.0). Electronically Signed   By: Marijo Conception M.D.   On: 06/03/2021 12:33   VAS [place] LOWER EXTREMITY VENOUS (DVT)  Result Date: 06/03/2021  Lower Venous DVT Study Patient Name:  ANNA MARIE C. Stringfellow  Date of Exam:   06/03/2021 Medical Rec #: [id number]            Accession #:    [id number] Date of Birth: [birth year]             Patient Gender: F Patient Age:   89 years Exam Location:  [facility] Procedure:      VAS [place] LOWER EXTREMITY VENOUS (DVT) Referring Phys: Cherylann Ratel --------------------------------------------------------------------------------  Indications: "DVT".  Risk Factors: Surgery Right total knee replacement 2 weeks ago - no complications. Comparison Study: No previous exams Performing Technologist: Jody Hill RVT, RDMS  Examination Guidelines: A complete evaluation includes B-mode imaging, spectral Doppler, color Doppler, and power Doppler as needed of all accessible portions of each vessel. Bilateral testing is considered an integral part of a complete examination. Limited examinations for reoccurring indications may be performed as noted. The reflux portion of the exam is performed with the patient in reverse Trendelenburg.  +---------+---------------+---------+-----------+----------+-------------------+ RIGHT    CompressibilityPhasicitySpontaneityPropertiesThrombus Aging      +---------+---------------+---------+-----------+----------+-------------------+ CFV      Full           Yes      Yes                                       +---------+---------------+---------+-----------+----------+-------------------+ SFJ      Full                                                             +---------+---------------+---------+-----------+----------+-------------------+  FV Prox  Full           Yes      Yes                                      +---------+---------------+---------+-----------+----------+-------------------+ FV Mid   Full           Yes      Yes                                      +---------+---------------+---------+-----------+----------+-------------------+ FV DistalFull           Yes      Yes                                      +---------+---------------+---------+-----------+----------+-------------------+ PFV      Full                                                             +---------+---------------+---------+-----------+----------+-------------------+ POP      Full           Yes      Yes                                      +---------+---------------+---------+-----------+----------+-------------------+ PTV      Full                                         Not well visualized +---------+---------------+---------+-----------+----------+-------------------+ PERO     Full                                         Not well visualized +---------+---------------+---------+-----------+----------+-------------------+   +----+---------------+---------+-----------+----------+--------------+ LEFTCompressibilityPhasicitySpontaneityPropertiesThrombus Aging +----+---------------+---------+-----------+----------+--------------+ CFV Full           Yes      Yes                                 +----+---------------+---------+-----------+----------+--------------+    Summary: RIGHT: - There is no evidence of deep vein thrombosis in the lower extremity. - There is no evidence of superficial venous thrombosis.  - No cystic structure found in the popliteal fossa.  LEFT: - No  evidence of common femoral vein obstruction.  *See table(s) above for measurements and observations.    Preliminary       Assessment/Plan Diverticulitis with perforation, likely contained The patient has evidence of diverticulitis as well as some small locules of free air.  She is quite tender on exam which is certainly concerning; however, her vitals and labs are reassuring.  Agree with admission and IVF resuscitation as well as IV abx therapy.  Repeat labs in am and follow her very closely.  If she does not start to  improve rather quickly, she may require a Hartmann's procedure.  This was discussed and she really wants to avoid that and a colostomy.  I told her we would try, but if she got worse or no better, that is what may be needed.  NPO for now and pain control.   FEN - NPO/IVFs VTE - ok for chemical prophylaxis ID - [email or web address]   Henreitta Cea, [facility] Surgery 06/03/2021, 3:33 PM Please see Amion for pager number during day hours 7:00am-4:30pm or 7:00am -11:30am on weekends

## 2021-06-03 NOTE — ED Notes (Signed)
Report sent to floor nurse.  

## 2021-06-03 NOTE — ED Provider Notes (Signed)
[facility] DEPT Provider Note   CSN: [id number] Arrival date & time: 06/03/21  0850     History Chief Complaint  Patient presents with   Nausea    Rindi Meneses is a 60 y.o. female.  60 year old female with history of diverticulitis presents with less than 24 hours of diffuse abdominal pain with nausea but no emesis.  She is had watery diarrhea.  She is recently status post right knee replacement approximate 2 weeks ago.  Had postop visit yesterday which shows that she is healing well.  States that she has had a diverticular abscess before in the past.  States that this feels similar.  Denies any urinary symptoms      Past Medical History:  Diagnosis Date   Arthritis    Asthma    Diverticulitis    Hyperlipidemia    Lupus ([place])    Panic attacks    Pneumonia 09/2020   with covid   PONV (postoperative nausea and vomiting)    Seasonal allergies     Patient Active Problem List   Diagnosis Date Noted   Status post right knee replacement 05/20/2021   Unilateral primary osteoarthritis, right knee 05/19/2021   Effusion of right knee 02/03/2021   Acquired trigger finger of right ring finger 10/26/2020   Acute diverticulitis 09/30/2019   E coli bacteremia 09/30/2019   Thrombocytopenia ([place]) 09/30/2019   AKI (acute kidney injury) ([place]) 09/30/2019   LFT elevation 09/30/2019   Bacteriuria 08/11/2018   Hydronephrosis, left 08/11/2018   Diverticulitis 08/10/2018   Colonic diverticular abscess 08/10/2018   Lupus ([place]) 08/10/2018   Hyperlipidemia 08/10/2018   HTN (hypertension) 08/10/2018   Anxiety 08/10/2018    Past Surgical History:  Procedure Laterality Date   PATELLA FRACTURE SURGERY Left    scar tissue eye  1983   right   TOTAL KNEE ARTHROPLASTY Right 05/20/2021   Procedure: RIGHT TOTAL KNEE ARTHROPLASTY;  Surgeon: Mcarthur Rossetti, MD;  Location: [facility];  Service: Orthopedics;  Laterality: Right;  Needs RNFA   TUBAL LIGATION   1985   WRIST FRACTURE SURGERY Right      OB History   No obstetric history on file.     Family History  Problem Relation Age of Onset   Colon cancer Neg Hx    Stomach cancer Neg Hx    Esophageal cancer Neg Hx    Rectal cancer Neg Hx     Social History   Tobacco Use   Smoking status: Every Day    Packs/day: 0.30    Years: 30.00    Pack years: 9.00    Types: Cigarettes   Smokeless tobacco: Never   Tobacco comments:    Trying to quit  Vaping Use   Vaping Use: Never used  Substance Use Topics   Alcohol use: No   Drug use: Yes    Frequency: 7.0 times per week    Types: Marijuana    Comment: CBD    Home Medications Prior to Admission medications   Medication Sig Start Date End Date Taking? Authorizing Provider  albuterol (VENTOLIN HFA) 108 (90 Base) MCG/ACT inhaler Inhale 2 puffs into the lungs every 6 (six) hours as needed for wheezing or shortness of breath.    [provider]  aspirin 81 MG chewable tablet Chew 1 tablet (81 mg total) by mouth 2 (two) times daily. 05/21/21   Mcarthur Rossetti, MD  escitalopram (LEXAPRO) 20 MG tablet Take 20 mg by mouth daily.  [provider]  gabapentin (NEURONTIN) 400 MG capsule Take 800 mg by mouth 3 (three) times daily.    [provider]  HYDROcodone-acetaminophen (NORCO/VICODIN) 5-325 MG tablet Take 1-2 tablets by mouth every 6 (six) hours as needed for moderate pain. 06/01/21   Mcarthur Rossetti, MD  linaclotide [facility]) 145 MCG CAPS capsule Take 1 capsule (145 mcg total) by mouth daily before breakfast. 06/02/21   Mcarthur Rossetti, MD  LORazepam (ATIVAN) 0.5 MG tablet Take 0.5-1 mg by mouth See admin instructions. 0.'5mg'$  in AM and '1mg'$  in evening 09/03/19   [provider]  ondansetron (ZOFRAN ODT) 4 MG disintegrating tablet Take 1 tablet (4 mg total) by mouth every 8 (eight) hours as needed for nausea or vomiting. 05/24/21   Mcarthur Rossetti, MD  oxyCODONE (OXY  IR/ROXICODONE) 5 MG immediate release tablet Take 1-2 tablets (5-10 mg total) by mouth every 4 (four) hours as needed for moderate pain (pain score 4-6). 05/21/21   Mcarthur Rossetti, MD  predniSONE (DELTASONE) 10 MG tablet Take 10 mg by mouth daily with breakfast.    [provider]  tacrolimus (PROTOPIC) 0.03 % ointment Apply 1 application topically 2 (two) times daily.    [provider]  tiZANidine (ZANAFLEX) 4 MG tablet Take 1 tablet (4 mg total) by mouth every 8 (eight) hours as needed for muscle spasms. 05/21/21   Mcarthur Rossetti, MD    Allergies    Iodine, Betadine [povidone iodine], Methocarbamol, and Sulfa antibiotics  Review of Systems   Review of Systems  All other systems reviewed and are negative.  Physical Exam Updated Vital Signs BP 137/87 (BP Location: Right Arm)   Pulse (!) 101   Temp 99.2 F (37.3 C) (Oral)   Resp (!) 22   SpO2 99%   Physical Exam Vitals and nursing note reviewed.  Constitutional:      General: She is not in acute distress.    Appearance: Normal appearance. She is well-developed. She is not toxic-appearing.  HENT:     Head: Normocephalic and atraumatic.  Eyes:     General: Lids are normal.     Conjunctiva/sclera: Conjunctivae normal.     Pupils: Pupils are equal, round, and reactive to light.  Neck:     Thyroid: No thyroid mass.     Trachea: No tracheal deviation.  Cardiovascular:     Rate and Rhythm: Normal rate and regular rhythm.     Heart sounds: Normal heart sounds. No murmur heard.   No gallop.  Pulmonary:     Effort: Pulmonary effort is normal. No respiratory distress.     Breath sounds: Normal breath sounds. No stridor. No decreased breath sounds, wheezing, rhonchi or rales.  Abdominal:     General: There is no distension.     Palpations: Abdomen is soft.     Tenderness: There is generalized abdominal tenderness. There is guarding. There is no rebound.  Musculoskeletal:        General: No  tenderness. Normal range of motion.     Cervical back: Normal range of motion and neck supple.  Skin:    General: Skin is warm and dry.     Findings: No abrasion or rash.  Neurological:     Mental Status: She is alert and oriented to person, place, and time. Mental status is at baseline.     GCS: GCS eye subscore is 4. GCS verbal subscore is 5. GCS motor subscore is 6.     Cranial  Nerves: Cranial nerves are intact. No cranial nerve deficit.     Sensory: No sensory deficit.     Motor: Motor function is intact.  Psychiatric:        Attention and Perception: Attention normal.        Speech: Speech normal.        Behavior: Behavior normal.    ED Results / Procedures / Treatments   Labs (all labs ordered are listed, but only abnormal results are displayed) Labs Reviewed  RESP PANEL BY RT-PCR (FLU A&B, COVID) ARPGX2  CBC WITH DIFFERENTIAL/PLATELET  COMPREHENSIVE METABOLIC PANEL  LIPASE, BLOOD  URINALYSIS, ROUTINE W REFLEX MICROSCOPIC    EKG None  Radiology No results found.  Procedures Procedures   Medications Ordered in ED Medications  lactated ringers bolus 2,000 mL (has no administration in time range)  lactated ringers infusion (has no administration in time range)  metoCLOPramide (REGLAN) injection 5 mg (has no administration in time range)  morphine 4 MG/ML injection 6 mg (has no administration in time range)    ED Course  I have reviewed the triage vital signs and the nursing notes.  Pertinent labs & imaging results that were available during my care of the patient were reviewed by me and considered in my medical decision making (see chart for details).    MDM Rules/Calculators/A&P                           Pt started on abx for diverticulitis  Consult with gen surgery, request medicine admit Final Clinical Impression(s) / ED Diagnoses Final diagnoses:  None    Rx / DC Orders ED Discharge Orders     None        Lacretia Leigh, MD 06/03/21  1353

## 2021-06-03 NOTE — Progress Notes (Signed)
Pharmacy Antibiotic Note  Brittany Hobbs is a 60 y.o. female admitted on 06/03/2021 with acute diverticulitis w/ perforation. Pharmacy has been consulted for Zosyn dosing.  Plan: Zosyn 3.375g IV x 1 over 30 minutes given in the ED. Continue with Zosyn 3.375g IV q8h (each dose infused over 4 hours).  Need for further dosage adjustment appears unlikely at present, so pharmacy will sign off at this time.  Please reconsult if a change in clinical status warrants re-evaluation of dosage.     Temp (24hrs), Avg:98.8 F (37.1 C), Min:98.3 F (36.8 C), Max:99.2 F (37.3 C)  Recent Labs  Lab 06/03/21 0921  WBC 10.1  CREATININE 0.62    CrCl cannot be calculated (Unknown ideal weight.).    Allergies  Allergen Reactions   Iodine Anaphylaxis    Pt states she is allergic to INTERNAL IODINE   Betadine [Povidone Iodine] Hives   Methocarbamol Nausea Only    Felt nauseated and sick   Sulfa Antibiotics     N/V    Antimicrobials this admission: 9/16 Zosyn >>  Dose adjustments this admission: --  Microbiology results: 9/16 Resp panel: COVID negative, Influenza A/B negative   Thank you for allowing pharmacy to be a part of this patient's care.   Lindell Spar, PharmD, BCPS Clinical Pharmacist  06/03/2021 2:27 PM

## 2021-06-03 NOTE — ED Notes (Signed)
GI at the bedside.

## 2021-06-03 NOTE — ED Notes (Signed)
Hospitalist at the bedside 

## 2021-06-03 NOTE — ED Notes (Signed)
Hospitalist paged regarding pt's tachycardic HR and increased respirations. Pt with second liter of LR running at this time. Pt placed on 2L supplemental [place] per Hospitalists recommendation. No additional orders at this time. Will continue to monitor.

## 2021-06-03 NOTE — ED Notes (Signed)
Pt to bedside commode

## 2021-06-03 NOTE — ED Notes (Signed)
Vascular at the bedside. 

## 2021-06-03 NOTE — ED Notes (Signed)
Pt insistent on ambulating to an from the bathroom despite two week post-op from knee surgery. Pt now back in ED exam room and writhing around on stretcher.

## 2021-06-03 NOTE — Progress Notes (Signed)
RLE venous duplex has been completed.  Results can be found under chart review under CV PROC. 06/03/2021 3:21 PM Cherron Blitzer RVT, RDMS

## 2021-06-03 NOTE — ED Triage Notes (Addendum)
Pt arrived via EMS, from home, diffuse abd pain that started last night. N/v    Given 4 mg zofran en route.

## 2021-06-03 NOTE — H&P (Signed)
History and Physical    Brittany Hobbs [id number] DOB: Sep 30, 1960 DOA: 06/03/2021  PCP: Berkley Harvey, NP  Patient coming from: home  Chief Complaint: abdominal pain  HPI: Brittany Hobbs is a 60 y.o. female with medical history significant of anxiety, lupus. Presenting with abdominal pain. Her symptoms began yesterday. She thought it was a gas pain as she was experiencing cramping and bloating. She tried taking linzess, but it didn't help. She had nausea but no vomiting. She had no fevers. Her symptoms became intolerable this morning, so she decided to come to the ED.   Of note, she had a recent TKA w/ orthopedics.She has had follow up and told it is healing well. She notes that the swelling around the leg had improved initially, but then it seem to worsen over the last several days. She denies any numbness, tingling, or pain in th leg.   ED Course: Imaging of the abdomen showed diverticulitis w/ pneumoperitoneum suggesting perforation. General surgery was consulted. Per EDP, they recommended no surgery. They recommended that she be started on abx and have a medical admission with surgery following in consultation. TRH was called for admission.   Review of Systems: Review of systems is otherwise negative for all not mentioned in HPI.   PMHx Past Medical History:  Diagnosis Date   Arthritis    Asthma    Diverticulitis    Hyperlipidemia    Lupus (Brittany Hobbs)    Panic attacks    Pneumonia 09/2020   with covid   PONV (postoperative nausea and vomiting)    Seasonal allergies     PSHx Past Surgical History:  Procedure Laterality Date   PATELLA FRACTURE SURGERY Left    scar tissue eye  1983   right   TOTAL KNEE ARTHROPLASTY Right 05/20/2021   Procedure: RIGHT TOTAL KNEE ARTHROPLASTY;  Surgeon: Mcarthur Rossetti, MD;  Location: [facility];  Service: Orthopedics;  Laterality: Right;  Needs RNFA   TUBAL LIGATION  1985   WRIST FRACTURE SURGERY Right     SocHx  reports that  she has been smoking cigarettes. She has a 9.00 pack-year smoking history. She has never used smokeless tobacco. She reports current drug use. Frequency: 7.00 times per week. Drug: Marijuana. She reports that she does not drink alcohol.  Allergies  Allergen Reactions   Iodine Anaphylaxis    Pt states she is allergic to INTERNAL IODINE   Betadine [Povidone Iodine] Hives   Methocarbamol Nausea Only    Felt nauseated and sick   Sulfa Antibiotics     N/V    FamHx Family History  Problem Relation Age of Onset   Colon cancer Neg Hx    Stomach cancer Neg Hx    Esophageal cancer Neg Hx    Rectal cancer Neg Hx     Prior to Admission medications   Medication Sig Start Date End Date Taking? Authorizing Provider  albuterol (VENTOLIN HFA) 108 (90 Base) MCG/ACT inhaler Inhale 2 puffs into the lungs every 6 (six) hours as needed for wheezing or shortness of breath.    [provider]  aspirin 81 MG chewable tablet Chew 1 tablet (81 mg total) by mouth 2 (two) times daily. 05/21/21   Mcarthur Rossetti, MD  escitalopram (LEXAPRO) 20 MG tablet Take 20 mg by mouth daily.    [provider]  gabapentin (NEURONTIN) 400 MG capsule Take 800 mg by mouth 3 (three) times daily.    [provider]  HYDROcodone-acetaminophen (  NORCO/VICODIN) 5-325 MG tablet Take 1-2 tablets by mouth every 6 (six) hours as needed for moderate pain. 06/01/21   Mcarthur Rossetti, MD  linaclotide [facility]) 145 MCG CAPS capsule Take 1 capsule (145 mcg total) by mouth daily before breakfast. 06/02/21   Mcarthur Rossetti, MD  LORazepam (ATIVAN) 0.5 MG tablet Take 0.5-1 mg by mouth See admin instructions. 0.'5mg'$  in AM and '1mg'$  in evening 09/03/19   [provider]  ondansetron (ZOFRAN ODT) 4 MG disintegrating tablet Take 1 tablet (4 mg total) by mouth every 8 (eight) hours as needed for nausea or vomiting. 05/24/21   Mcarthur Rossetti, MD  oxyCODONE (OXY IR/ROXICODONE) 5 MG immediate  release tablet Take 1-2 tablets (5-10 mg total) by mouth every 4 (four) hours as needed for moderate pain (pain score 4-6). 05/21/21   Mcarthur Rossetti, MD  predniSONE (DELTASONE) 10 MG tablet Take 10 mg by mouth daily with breakfast.    [provider]  tacrolimus (PROTOPIC) 0.03 % ointment Apply 1 application topically 2 (two) times daily.    [provider]  tiZANidine (ZANAFLEX) 4 MG tablet Take 1 tablet (4 mg total) by mouth every 8 (eight) hours as needed for muscle spasms. 05/21/21   Mcarthur Rossetti, MD    Physical Exam: Vitals:   06/03/21 1045 06/03/21 1100 06/03/21 1130 06/03/21 1325  BP: (!) 143/69 131/65 (!) 144/76 126/65  Pulse: 98 94 97 92  Resp: '20 19 18 '$ (!) 22  Temp:      TempSrc:      SpO2: 97% 97% 95% 100%    General: 60 y.o. female resting in bed in NAD Eyes: PERRL, normal sclera ENMT: Nares patent w/o discharge, orophaynx clear, dentition normal, ears w/o discharge/lesions/ulcers Neck: Supple, trachea midline Cardiovascular: tachy, +S1, S2, no m/g/r, equal pulses throughout Respiratory: CTABL, no w/r/r, normal WOB GI: BS hypoactive, mild distention, global TTP, no masses noted, no organomegaly noted MSK: No c/cl RLE edema, surgical site clean Skin: No rashes, bruises, ulcerations noted Neuro: A&O x 3, no focal deficits Psyc: Appropriate interaction and affect, calm/cooperative  Labs on Admission: I have personally reviewed following labs and imaging studies  CBC: Recent Labs  Lab 06/03/21 0921  WBC 10.1  NEUTROABS 8.9*  HGB 13.7  HCT 42.3  MCV 88.9  PLT [id number]   Basic Metabolic Panel: Recent Labs  Lab 06/03/21 0921  NA 143  K 4.1  CL 109  CO2 25  GLUCOSE 107*  BUN 28*  CREATININE 0.62  CALCIUM 9.6   GFR: CrCl cannot be calculated (Unknown ideal weight.). Liver Function Tests: Recent Labs  Lab 06/03/21 0921  AST 24  ALT 14  ALKPHOS 120  BILITOT 1.4*  PROT 7.2  ALBUMIN 3.8   Recent Labs  Lab 06/03/21 0921   LIPASE 22   No results for input(s): AMMONIA in the last 168 hours. Coagulation Profile: No results for input(s): INR, PROTIME in the last 168 hours. Cardiac Enzymes: No results for input(s): CKTOTAL, CKMB, CKMBINDEX, TROPONINI in the last 168 hours. BNP (last 3 results) No results for input(s): PROBNP in the last 8760 hours. HbA1C: No results for input(s): HGBA1C in the last 72 hours. CBG: No results for input(s): GLUCAP in the last 168 hours. Lipid Profile: No results for input(s): CHOL, HDL, LDLCALC, TRIG, CHOLHDL, LDLDIRECT in the last 72 hours. Thyroid Function Tests: No results for input(s): TSH, T4TOTAL, FREET4, T3FREE, THYROIDAB in the last 72 hours. Anemia Panel: No results for input(s): VITAMINB12, FOLATE,  FERRITIN, TIBC, IRON, RETICCTPCT in the last 72 hours. Urine analysis:    Component Value Date/Time   COLORURINE YELLOW 06/03/2021 [place] 06/03/2021 1326   LABSPEC 1.015 06/03/2021 1326   PHURINE 6.0 06/03/2021 1326   GLUCOSEU NEGATIVE 06/03/2021 1326   HGBUR NEGATIVE 06/03/2021 1326   BILIRUBINUR NEGATIVE 06/03/2021 1326   [place] 06/03/2021 [place] 06/03/2021 1326   NITRITE NEGATIVE 06/03/2021 1326   [place] 06/03/2021 1326    Radiological Exams on Admission: CT Abdomen Pelvis Wo Contrast  Result Date: 06/03/2021 CLINICAL DATA:  Acute generalized abdominal pain. EXAM: CT ABDOMEN AND PELVIS WITHOUT CONTRAST TECHNIQUE: Multidetector CT imaging of the abdomen and pelvis was performed following the standard protocol without IV contrast. COMPARISON:  September 29, 2019. FINDINGS: Lower chest: Mild right posterior basilar subsegmental atelectasis is noted. Hepatobiliary: Cholelithiasis is noted. No biliary dilatation is noted. Liver is unremarkable. Pancreas: Unremarkable. No pancreatic ductal dilatation or surrounding inflammatory changes. Spleen: Normal in size without focal abnormality. Adrenals/Urinary  Tract: Adrenal glands are unremarkable. Kidneys are normal, without renal calculi, focal lesion, or hydronephrosis. Bladder is unremarkable. Stomach/Bowel: Stomach and appendix are unremarkable. There is no evidence of bowel obstruction. There is again noted wall thickening involving the sigmoid colon with surrounding inflammatory changes concerning for sigmoid diverticulitis. There is an adjacent crescent-shaped air collection concerning for perforation. Also noted is mild pneumoperitoneum in the anterior portion of the central abdomen. Vascular/Lymphatic: Aortic atherosclerosis. No enlarged abdominal or pelvic lymph nodes. Reproductive: Uterus and bilateral adnexa are unremarkable. Other: No abdominal wall hernia or abnormality. No abdominopelvic ascites. Musculoskeletal: No acute or significant osseous findings. IMPRESSION: Moderate to severe sigmoid diverticulitis is noted with adjacent crescent-shaped air collection concerning for perforation. Also noted is mild pneumoperitoneum in the anterior portion of the central abdomen also suggesting perforation. Critical Value/emergent results were called by telephone at the time of interpretation on 06/03/2021 at 12:33 pm to provider Lacretia Leigh , who verbally acknowledged these results. Cholelithiasis. Aortic Atherosclerosis (ICD10-I70.0). Electronically Signed   By: Marijo Conception M.D.   On: 06/03/2021 12:33    EKG: None obtained in ED.   Assessment/Plan Acute diverticulitis w/ perforation     - admit to inpt, med-surg     - continue fluids, zosyn, anti-emetics, pain control     - NPO for now until surgical recs placed     - awaiting final surgical recs  RLE edema     - recent TKA     - followed up in clinic with ortho, noted good ROM and good progress     - reported a fall; but no xray needed at the time     - she denies pain now     - check doppler  Anxiety     - resume home regimen when taking PO  Lupus     - resume home regimen when taking  PO  Hyperbilirubinemia     - mild, follow for now  DVT prophylaxis: lovenox Code Status: FULL  Family Communication: None at bedside  Consults called: General Surgery   Status is: Inpatient  Remains inpatient appropriate because:Inpatient level of care appropriate due to severity of illness  Dispo: The patient is from: Home              Anticipated d/c is to: Home              Patient currently is not medically stable to d/c.   Difficult  to place patient No  Time spent coordinating admission: 60 minutes  [place] [facility]  If 7PM-7AM, please contact night-coverage [email or web address]  06/03/2021, 2:06 PM

## 2021-06-04 DIAGNOSIS — K5792 Diverticulitis of intestine, part unspecified, without perforation or abscess without bleeding: Secondary | ICD-10-CM | POA: Diagnosis not present

## 2021-06-04 LAB — COMPREHENSIVE METABOLIC PANEL
ALT: 11 U/L (ref 0–44)
AST: 16 U/L (ref 15–41)
Albumin: 3.1 g/dL — ABNORMAL LOW (ref 3.5–5.0)
Alkaline Phosphatase: 97 U/L (ref 38–126)
Anion gap: 11 (ref 5–15)
BUN: 23 mg/dL — ABNORMAL HIGH (ref 6–20)
CO2: 28 mmol/L (ref 22–32)
Calcium: 9.1 mg/dL (ref 8.9–10.3)
Chloride: 104 mmol/L (ref 98–111)
Creatinine, Ser: 0.66 mg/dL (ref 0.44–1.00)
GFR, Estimated: >60 mL/min (ref >60)
Glucose, Bld: 87 mg/dL (ref 70–99)
Potassium: 4.8 mmol/L (ref 3.5–5.1)
Sodium: 143 mmol/L (ref 135–145)
Total Bilirubin: 1 mg/dL (ref 0.3–1.2)
Total Protein: 6.6 g/dL (ref 6.5–8.1)

## 2021-06-04 LAB — CBC
HCT: 41.4 % (ref 36.0–46.0)
Hemoglobin: 13.1 g/dL (ref 12.0–15.0)
MCH: 28.3 pg (ref 26.0–34.0)
MCHC: 31.6 g/dL (ref 30.0–36.0)
MCV: 89.4 fL (ref 80.0–100.0)
Platelets: 327 10*3/uL (ref 150–400)
RBC: 4.63 MIL/uL (ref 3.87–5.11)
RDW: 16.2 % — ABNORMAL HIGH (ref 11.5–15.5)
WBC: 14.7 10*3/uL — ABNORMAL HIGH (ref 4.0–10.5)
nRBC: 0 % (ref 0.0–0.2)

## 2021-06-04 LAB — HIV ANTIBODY (ROUTINE TESTING W REFLEX): HIV Screen 4th Generation wRfx: NONREACTIVE

## 2021-06-04 MED ORDER — METOPROLOL TARTRATE 12.5 MG HALF TABLET
12.5000 mg | ORAL_TABLET | Freq: Every day | ORAL | Status: DC
  Administered 2021-06-05 – 2021-06-11 (×6): 12.5 mg via ORAL
  Filled 2021-06-04 (×7): qty 1

## 2021-06-04 MED ORDER — PREDNISONE 5 MG PO TABS
10.0000 mg | ORAL_TABLET | Freq: Every day | ORAL | Status: DC
  Administered 2021-06-04 – 2021-06-11 (×7): 10 mg via ORAL
  Filled 2021-06-04 (×7): qty 2

## 2021-06-04 MED ORDER — LORAZEPAM 0.5 MG PO TABS: 0.5000 mg | ORAL_TABLET | ORAL | Status: DC

## 2021-06-04 MED ORDER — ASPIRIN 81 MG PO CHEW: 81.0000 mg | CHEWABLE_TABLET | Freq: Two times a day (BID) | ORAL | Status: DC

## 2021-06-04 MED ORDER — LORAZEPAM 1 MG PO TABS
1.0000 mg | ORAL_TABLET | Freq: Every evening | ORAL | Status: DC
  Administered 2021-06-06 – 2021-06-09 (×4): 1 mg via ORAL
  Filled 2021-06-04 (×4): qty 1

## 2021-06-04 MED ORDER — LORAZEPAM 0.5 MG PO TABS
0.5000 mg | ORAL_TABLET | Freq: Every morning | ORAL | Status: DC
  Administered 2021-06-05 – 2021-06-09 (×4): 0.5 mg via ORAL
  Filled 2021-06-04 (×3): qty 1

## 2021-06-04 MED ORDER — LINACLOTIDE 145 MCG PO CAPS: 145.0000 ug | ORAL_CAPSULE | Freq: Every day | ORAL | Status: DC

## 2021-06-04 MED ORDER — ESCITALOPRAM OXALATE 20 MG PO TABS
20.0000 mg | ORAL_TABLET | Freq: Every day | ORAL | Status: DC
  Administered 2021-06-04 – 2021-06-11 (×7): 20 mg via ORAL
  Filled 2021-06-04 (×7): qty 1

## 2021-06-04 NOTE — Progress Notes (Signed)
After pt received IV pain med, noted pt talking to herself. When I spoke directly w pt, she is A & O X 4, says that she normally talks to herself. PM dose of Ativan held.

## 2021-06-04 NOTE — Progress Notes (Signed)
Patient ID: Brittany Hobbs, female   DOB: [birth year], 60 y.o.   MRN: [id number]   Acute Care Surgery Service Progress Note:    Chief Complaint/Subjective: Feels more comfortable; still having pain but more comfortable No n/v Using a walker at home after knee surgery  Objective: Vital signs in last 24 hours: Temp:  [97.6 F (36.4 C)-99.7 F (37.6 C)] 97.6 F (36.4 C) (09/17 0621) Pulse Rate:  [92-109] 95 (09/17 0621) Resp:  [15-27] 18 (09/17 0621) BP: (101-152)/(53-87) 122/67 (09/17 0621) SpO2:  [91 %-100 %] 95 % (09/17 0621) Weight:  [64 kg] 64 kg (09/16 1525) Last BM Date: 06/03/21  Intake/Output from previous day: 09/16 0701 - 09/17 0700 In: 2004.5 [I.V.:1926.5; IV Piggyback:78] Out: 1000 [Urine:1000] Intake/Output this shift: No intake/output data recorded.  Lungs: cta, nonlabored  Cardiovascular: reg  Abd: soft, a little distended, TTP in lower abd but less tender than yesterday; no guarding  Extremities: no edema, +SCDs; swelling around right knee  Neuro: alert, nonfocal  Lab Results: CBC  Recent Labs    06/03/21 0921 06/04/21 0435  WBC 10.1 14.7*  HGB 13.7 13.1  HCT 42.3 41.4  PLT 357 327   BMET Recent Labs    06/03/21 0921 06/04/21 0435  NA 143 143  K 4.1 4.8  CL 109 104  CO2 25 28  GLUCOSE 107* 87  BUN 28* 23*  CREATININE 0.62 0.66  CALCIUM 9.6 9.1   LFT Hepatic Function Latest Ref Rng & Units 06/04/2021 06/03/2021 10/02/2019  Total Protein 6.5 - 8.1 g/dL 6.6 7.2 6.0(L)  Albumin 3.5 - 5.0 g/dL 3.1(L) 3.8 2.8(L)  AST 15 - 41 U/L 16 24 45(H)  ALT 0 - 44 U/L 11 14 43  Alk Phosphatase 38 - 126 U/L 97 120 141(H)  Total Bilirubin 0.3 - 1.2 mg/dL 1.0 1.4(H) 0.9   PT/INR No results for input(s): LABPROT, INR in the last 72 hours. ABG No results for input(s): PHART, HCO3 in the last 72 hours.  Invalid input(s): PCO2, PO2  Studies/Results:  Anti-infectives: Anti-infectives (From admission, onward)    Start     Dose/Rate Route  Frequency Ordered Stop   06/03/21 2000  piperacillin-tazobactam (ZOSYN) IVPB 3.375 g        3.375 g 12.5 mL/hr over 240 Minutes Intravenous Every 8 hours 06/03/21 1528     06/03/21 1330  piperacillin-tazobactam (ZOSYN) IVPB 3.375 g        3.375 g 100 mL/hr over 30 Minutes Intravenous  Once 06/03/21 1315 06/03/21 1527       Medications: Scheduled Meds:  acetaminophen  1,000 mg Oral Q6H   enoxaparin (LOVENOX) injection  40 mg Subcutaneous Q24H   gabapentin  800 mg Oral TID   Continuous Infusions:  lactated ringers 125 mL/hr at 06/04/21 0311   piperacillin-tazobactam (ZOSYN)  IV 3.375 g (06/04/21 0309)   PRN Meds:.HYDROmorphone (DILAUDID) injection, morphine injection, ondansetron **OR** ondansetron (ZOFRAN) IV  Assessment/Plan: Patient Active Problem List   Diagnosis Date Noted   Status post right knee replacement 05/20/2021   Unilateral primary osteoarthritis, right knee 05/19/2021   Effusion of right knee 02/03/2021   Acquired trigger finger of right ring finger 10/26/2020   Acute diverticulitis 09/30/2019   E coli bacteremia 09/30/2019   Thrombocytopenia ([place]) 09/30/2019   AKI (acute kidney injury) ([place]) 09/30/2019   LFT elevation 09/30/2019   Bacteriuria 08/11/2018   Hydronephrosis, left 08/11/2018   Diverticulitis 08/10/2018   Colonic diverticular abscess 08/10/2018   Lupus ([place]) 08/10/2018  Hyperlipidemia 08/10/2018   HTN (hypertension) 08/10/2018   Anxiety 08/10/2018   Diverticulitis with perforation, contained  FEN- adv to CLD, IVF VTE- lovenox ID- Zosyn 9/16>  Disposition: although wbc up some today, pt has no fever, no tachy, more comfortable, less tender - will start CLD; ambulate - ordered walker  LOS: 1 day    Devesh Monforte M. [place]mond Pulling, MD, FACS General, Bariatric, & Minimally Invasive Surgery [phone] [facility]

## 2021-06-04 NOTE — Progress Notes (Addendum)
PROGRESS NOTE    Brittany Hobbs  [id number] DOB: 04/01/1961 DOA: 06/03/2021 PCP: Berkley Harvey, NP   Chief Complain: Abdominal pain  Brief Narrative:  Patient is a 60 year old female with history of anxiety, lupus who presented with abdominal pain.  She has recent history of total knee replacement.  Imagings of the abdomen done on presentation showed diverticulitis with pneumoperitoneum suggesting perforation.  General surgery consulted and following.  Currently on conservative management with IV antibiotics.  Started on clear liquid diet today. Assessment & Plan:   Active Problems:   Acute diverticulitis   Acute diverticulitis with perforation: Abdomen pain has improved.  Started on Zosyn, continue the same for now.  General surgery following.  Started on clear liquid diet today. She states she had some loose bowel movement.  Passing gas.  Leukocytosis: WBC jumped to 14,000 today.  Will check CBC tomorrow.  Patient is afebrile.  Right lower extremity edema: Recent history of TKA.  Follow-up with hospice recommended as an outpatient.  Doppler study did not show DVT.  Anxiety: On anxiety medications at home  Lupus: Resume home medications  Hyperbilirubinemia: Mild, continue to follow           DVT prophylaxis:Lovenox Code Status: Full Family Communication: None at bedside Status is: Inpatient  Remains inpatient appropriate because:Inpatient level of care appropriate due to severity of illness  Dispo: The patient is from: Home              Anticipated d/c is to: Home              Patient currently is not medically stable to d/c.   Difficult to place patient No      Consultants: General surgery  Procedures:None  Antimicrobials:  Anti-infectives (From admission, onward)    Start     Dose/Rate Route Frequency Ordered Stop   06/03/21 2000  piperacillin-tazobactam (ZOSYN) IVPB 3.375 g        3.375 g 12.5 mL/hr over 240 Minutes Intravenous Every 8 hours  06/03/21 1528     06/03/21 1330  piperacillin-tazobactam (ZOSYN) IVPB 3.375 g        3.375 g 100 mL/hr over 30 Minutes Intravenous  Once 06/03/21 1315 06/03/21 1527       Subjective:  Patient seen and examined the bedside this morning.  Medically stable.  Abdomen pain has improved but she  still has some abdominal discomfort.  No nausea or vomiting.  Said that she had some loose bowel movement.  Passing gas   Objective: Vitals:   06/03/21 1743 06/03/21 2141 06/04/21 0213 06/04/21 0621  BP: (!) 145/83 134/68 138/80 122/67  Pulse: (!) 103 95 (!) 108 95  Resp: '15 20 18 18  '$ Temp: 98.6 F (37 C) 99.6 F (37.6 C) 97.8 F (36.6 C) 97.6 F (36.4 C)  TempSrc: Oral Oral Oral Oral  SpO2: 91% 94% 91% 95%  Weight:      Height:        Intake/Output Summary (Last 24 hours) at 06/04/2021 0955 Last data filed at 06/04/2021 0600 Gross per 24 hour  Intake 2004.47 ml  Output 1000 ml  Net 1004.47 ml   Filed Weights   06/03/21 1525  Weight: 64 kg    Examination:  General exam: Overall comfortable, not in distress HEENT: PERRL Respiratory system:  no wheezes or crackles  Cardiovascular system: S1 & S2 heard, RRR.  Gastrointestinal system: Abdomen is nondistended, soft , has generalized tenderness, slow bowel sounds heard. Central nervous  system: Alert and oriented Extremities: No edema, no clubbing ,no cyanosis Skin: No rashes, no ulcers,no icterus      Data Reviewed: I have personally reviewed following labs and imaging studies  CBC: Recent Labs  Lab 06/03/21 0921 06/04/21 0435  WBC 10.1 14.7*  NEUTROABS 8.9*  --   HGB 13.7 13.1  HCT 42.3 41.4  MCV 88.9 89.4  PLT 357 [id number]   Hobbs Metabolic Panel: Recent Labs  Lab 06/03/21 0921 06/04/21 0435  NA 143 143  K 4.1 4.8  CL 109 104  CO2 25 28  GLUCOSE 107* 87  BUN 28* 23*  CREATININE 0.62 0.66  CALCIUM 9.6 9.1   GFR: Estimated Creatinine Clearance: 68.1 mL/min (by C-G formula based on SCr of 0.66 mg/dL). Liver  Function Tests: Recent Labs  Lab 06/03/21 0921 06/04/21 0435  AST 24 16  ALT 14 11  ALKPHOS 120 97  BILITOT 1.4* 1.0  PROT 7.2 6.6  ALBUMIN 3.8 3.1*   Recent Labs  Lab 06/03/21 0921  LIPASE 22   No results for input(s): AMMONIA in the last 168 hours. Coagulation Profile: No results for input(s): INR, PROTIME in the last 168 hours. Cardiac Enzymes: No results for input(s): CKTOTAL, CKMB, CKMBINDEX, TROPONINI in the last 168 hours. BNP (last 3 results) No results for input(s): PROBNP in the last 8760 hours. HbA1C: No results for input(s): HGBA1C in the last 72 hours. CBG: No results for input(s): GLUCAP in the last 168 hours. Lipid Profile: No results for input(s): CHOL, HDL, LDLCALC, TRIG, CHOLHDL, LDLDIRECT in the last 72 hours. Thyroid Function Tests: No results for input(s): TSH, T4TOTAL, FREET4, T3FREE, THYROIDAB in the last 72 hours. Anemia Panel: No results for input(s): VITAMINB12, FOLATE, FERRITIN, TIBC, IRON, RETICCTPCT in the last 72 hours. Sepsis Labs: No results for input(s): PROCALCITON, LATICACIDVEN in the last 168 hours.  Recent Results (from the past 240 hour(s))  Resp Panel by RT-PCR (Flu A&B, Covid) Nasopharyngeal Swab     Status: None   Collection Time: 06/03/21  9:51 AM   Specimen: Nasopharyngeal Swab; Nasopharyngeal(NP) swabs in vial transport medium  Result Value Ref Range Status   SARS Coronavirus 2 by RT PCR NEGATIVE NEGATIVE Final    Comment: (NOTE) SARS-CoV-2 target nucleic acids are NOT DETECTED.  The SARS-CoV-2 RNA is generally detectable in upper respiratory specimens during the acute phase of infection. The lowest concentration of SARS-CoV-2 viral copies this assay can detect is 138 copies/mL. A negative result does not preclude SARS-Cov-2 infection and should not be used as the sole basis for treatment or other patient management decisions. A negative result may occur with  improper specimen collection/handling, submission of specimen  other than nasopharyngeal swab, presence of viral mutation(s) within the areas targeted by this assay, and inadequate number of viral copies(<138 copies/mL). A negative result must be combined with clinical observations, patient history, and epidemiological information. The expected result is Negative.  Fact Sheet for Patients:  EntrepreneurPulse.com.au  Fact Sheet for Healthcare Providers:  IncredibleEmployment.be  This test is no t yet approved or cleared by the [place] FDA and  has been authorized for detection and/or diagnosis of SARS-CoV-2 by FDA under an Emergency Use Authorization (EUA). This EUA will remain  in effect (meaning this test can be used) for the duration of the COVID-19 declaration under Section 564(b)(1) of the Act, 21 U.S.C.section 360bbb-3(b)(1), unless the authorization is terminated  or revoked sooner.       Influenza A by PCR NEGATIVE NEGATIVE Final  Influenza B by PCR NEGATIVE NEGATIVE Final    Comment: (NOTE) The Xpert Xpress SARS-CoV-2/FLU/RSV plus assay is intended as an aid in the diagnosis of influenza from Nasopharyngeal swab specimens and should not be used as a sole basis for treatment. Nasal washings and aspirates are unacceptable for Xpert Xpress SARS-CoV-2/FLU/RSV testing.  Fact Sheet for Patients: EntrepreneurPulse.com.au  Fact Sheet for Healthcare Providers: IncredibleEmployment.be  This test is not yet approved or cleared by the [place] FDA and has been authorized for detection and/or diagnosis of SARS-CoV-2 by FDA under an Emergency Use Authorization (EUA). This EUA will remain in effect (meaning this test can be used) for the duration of the COVID-19 declaration under Section 564(b)(1) of the Act, 21 U.S.C. section 360bbb-3(b)(1), unless the authorization is terminated or revoked.  Performed at [facility], [place] [address] [place], [place] [address]          Radiology Studies: CT Abdomen Pelvis Wo Contrast  Result Date: 06/03/2021 CLINICAL DATA:  Acute generalized abdominal pain. EXAM: CT ABDOMEN AND PELVIS WITHOUT CONTRAST TECHNIQUE: Multidetector CT imaging of the abdomen and pelvis was performed following the standard protocol without IV contrast. COMPARISON:  September 29, 2019. FINDINGS: Lower chest: Mild right posterior basilar subsegmental atelectasis is noted. Hepatobiliary: Cholelithiasis is noted. No biliary dilatation is noted. Liver is unremarkable. Pancreas: Unremarkable. No pancreatic ductal dilatation or surrounding inflammatory changes. Spleen: Normal in size without focal abnormality. Adrenals/Urinary Tract: Adrenal glands are unremarkable. Kidneys are normal, without renal calculi, focal lesion, or hydronephrosis. Bladder is unremarkable. Stomach/Bowel: Stomach and appendix are unremarkable. There is no evidence of bowel obstruction. There is again noted wall thickening involving the sigmoid colon with surrounding inflammatory changes concerning for sigmoid diverticulitis. There is an adjacent crescent-shaped air collection concerning for perforation. Also noted is mild pneumoperitoneum in the anterior portion of the central abdomen. Vascular/Lymphatic: Aortic atherosclerosis. No enlarged abdominal or pelvic lymph nodes. Reproductive: Uterus and bilateral adnexa are unremarkable. Other: No abdominal wall hernia or abnormality. No abdominopelvic ascites. Musculoskeletal: No acute or significant osseous findings. IMPRESSION: Moderate to severe sigmoid diverticulitis is noted with adjacent crescent-shaped air collection concerning for perforation. Also noted is mild pneumoperitoneum in the anterior portion of the central abdomen also suggesting perforation. Critical Value/emergent results were called by telephone at the time of interpretation on 06/03/2021 at 12:33 pm to provider Lacretia Leigh , who verbally  acknowledged these results. Cholelithiasis. Aortic Atherosclerosis (ICD10-I70.0). Electronically Signed   By: Marijo Conception M.D.   On: 06/03/2021 12:33   DG CHEST PORT 1 VIEW  Result Date: 06/03/2021 CLINICAL DATA:  Diffuse abdominal pain, nausea, vomiting EXAM: PORTABLE CHEST 1 VIEW COMPARISON:  01/02/2010 FINDINGS: The heart size and mediastinal contours are within normal limits. Mild, diffuse interstitial pulmonary opacity. The visualized skeletal structures are unremarkable. IMPRESSION: Mild, diffuse interstitial pulmonary opacity, which may reflect mild edema or atypical/viral infection. No focal airspace opacity. Electronically Signed   By: Eddie Candle M.D.   On: 06/03/2021 16:30   VAS [place] LOWER EXTREMITY VENOUS (DVT)  Result Date: 06/03/2021  Lower Venous DVT Study Patient Name:  ANNA MARIE C. Dishman  Date of Exam:   06/03/2021 Medical Rec #: [id number]            Accession #:    [id number] Date of Birth: [birth year]             Patient Gender: F Patient Age:   17 years Exam Location:  [facility] Procedure:  VAS [place] LOWER EXTREMITY VENOUS (DVT) Referring Phys: Cherylann Ratel --------------------------------------------------------------------------------  Indications: "DVT".  Risk Factors: Surgery Right total knee replacement 2 weeks ago - no complications. Comparison Study: No previous exams Performing Technologist: Jody Hill RVT, RDMS  Examination Guidelines: A complete evaluation includes B-mode imaging, spectral Doppler, color Doppler, and power Doppler as needed of all accessible portions of each vessel. Bilateral testing is considered an integral part of a complete examination. Limited examinations for reoccurring indications may be performed as noted. The reflux portion of the exam is performed with the patient in reverse Trendelenburg.  +---------+---------------+---------+-----------+----------+-------------------+ RIGHT    CompressibilityPhasicitySpontaneityPropertiesThrombus  Aging      +---------+---------------+---------+-----------+----------+-------------------+ CFV      Full           Yes      Yes                                      +---------+---------------+---------+-----------+----------+-------------------+ SFJ      Full                                                             +---------+---------------+---------+-----------+----------+-------------------+ FV Prox  Full           Yes      Yes                                      +---------+---------------+---------+-----------+----------+-------------------+ FV Mid   Full           Yes      Yes                                      +---------+---------------+---------+-----------+----------+-------------------+ FV DistalFull           Yes      Yes                                      +---------+---------------+---------+-----------+----------+-------------------+ PFV      Full                                                             +---------+---------------+---------+-----------+----------+-------------------+ POP      Full           Yes      Yes                                      +---------+---------------+---------+-----------+----------+-------------------+ PTV      Full                                         Not well visualized +---------+---------------+---------+-----------+----------+-------------------+  PERO     Full                                         Not well visualized +---------+---------------+---------+-----------+----------+-------------------+   +----+---------------+---------+-----------+----------+--------------+ LEFTCompressibilityPhasicitySpontaneityPropertiesThrombus Aging +----+---------------+---------+-----------+----------+--------------+ CFV Full           Yes      Yes                                 +----+---------------+---------+-----------+----------+--------------+    Summary: RIGHT: - There is no evidence  of deep vein thrombosis in the lower extremity. - There is no evidence of superficial venous thrombosis.  - No cystic structure found in the popliteal fossa.  LEFT: - No evidence of common femoral vein obstruction.  *See table(s) above for measurements and observations.    Preliminary         Scheduled Meds:  acetaminophen  1,000 mg Oral Q6H   enoxaparin (LOVENOX) injection  40 mg Subcutaneous Q24H   gabapentin  800 mg Oral TID   Continuous Infusions:  lactated ringers 125 mL/hr at 06/04/21 0311   piperacillin-tazobactam (ZOSYN)  IV 3.375 g (06/04/21 0309)     LOS: 1 day    Time spent: 25 mins.More than 50% of that time was spent in counseling and/or coordination of care.      Shelly Coss, MD [facility] P9/17/2022, 9:55 AM

## 2021-06-05 DIAGNOSIS — K5792 Diverticulitis of intestine, part unspecified, without perforation or abscess without bleeding: Secondary | ICD-10-CM | POA: Diagnosis not present

## 2021-06-05 LAB — CBC
HCT: 37 % (ref 36.0–46.0)
Hemoglobin: 12 g/dL (ref 12.0–15.0)
MCH: 28.8 pg (ref 26.0–34.0)
MCHC: 32.4 g/dL (ref 30.0–36.0)
MCV: 88.9 fL (ref 80.0–100.0)
Platelets: 336 10*3/uL (ref 150–400)
RBC: 4.16 MIL/uL (ref 3.87–5.11)
RDW: 16.3 % — ABNORMAL HIGH (ref 11.5–15.5)
WBC: 16.3 10*3/uL — ABNORMAL HIGH (ref 4.0–10.5)
nRBC: 0 % (ref 0.0–0.2)

## 2021-06-05 NOTE — Progress Notes (Signed)
PROGRESS NOTE    Brittany Hobbs  [id number] DOB: Feb 23, 1961 DOA: 06/03/2021 PCP: Berkley Harvey, NP   Chief Complain: Abdominal pain  Brief Narrative:  Patient is a 60 year old female with history of anxiety, lupus who presented with abdominal pain.  She has recent history of total knee replacement.  Imagings of the abdomen done on presentation showed diverticulitis with pneumoperitoneum suggesting perforation.  General surgery consulted and following.  Currently on conservative management with IV antibiotics.  Started on full liquid diet today. Assessment & Plan:   Active Problems:   Acute diverticulitis   Acute diverticulitis with perforation: Started on Zosyn, continue the same for now.  General surgery following.   She states she had some loose bowel movement.  Passing gas.  Says he still having significant abdominal discomfort and needing a lot of IV pain medications  Leukocytosis: WBC jumped to 16300 today.  Will check CBC tomorrow.  Patient is afebrile.  I suspect this is not from the steroid that she was taking at home.  She takes prednisone 10 mg daily.  Right lower extremity edema: Recent history of TKA.  Has stitches.Follow-up with orthopedics recommended as an outpatient.  Doppler study did not show DVT.  Anxiety: On anxiety medications as needed  Lupus: Resume home medications,on prednisone 10 mg daily            DVT prophylaxis:Lovenox Code Status: Full Family Communication: None at bedside Status is: Inpatient  Remains inpatient appropriate because:Inpatient level of care appropriate due to severity of illness  Dispo: The patient is from: Home              Anticipated d/c is to: Home              Patient currently is not medically stable to d/c.   Difficult to place patient No      Consultants: General surgery  Procedures:None  Antimicrobials:  Anti-infectives (From admission, onward)    Start     Dose/Rate Route Frequency Ordered Stop    06/03/21 2000  piperacillin-tazobactam (ZOSYN) IVPB 3.375 g        3.375 g 12.5 mL/hr over 240 Minutes Intravenous Every 8 hours 06/03/21 1528     06/03/21 1330  piperacillin-tazobactam (ZOSYN) IVPB 3.375 g        3.375 g 100 mL/hr over 30 Minutes Intravenous  Once 06/03/21 1315 06/03/21 1527       Subjective: Patient seen and examined at the bedside this morning.  Hemodynamically stable.  She says she has abdominal pain.  RN alerted me that she was asking a lot of IV pain medication.  She says she had some loose bowel movement today, passing gas.  She is using oxygen for comfort.  Objective: Vitals:   06/04/21 1404 06/04/21 2153 06/04/21 2200 06/05/21 0624  BP: 116/62 119/72  (!) 159/78  Pulse: 68 (!) 102  83  Resp: '18 18  18  '$ Temp: 98.7 F (37.1 C) (!) 97.5 F (36.4 C)  97.9 F (36.6 C)  TempSrc: Oral Oral  Oral  SpO2: 100% (!) 87% 93% 96%  Weight:      Height:        Intake/Output Summary (Last 24 hours) at 06/05/2021 0736 Last data filed at 06/05/2021 0200 Gross per 24 hour  Intake 3346.17 ml  Output 500 ml  Net 2846.17 ml   Filed Weights   06/03/21 1525  Weight: 64 kg    Examination:  General exam: Overall comfortable, not in  distress HEENT: PERRL Respiratory system:  no wheezes or crackles  Cardiovascular system: S1 & S2 heard, RRR.  Gastrointestinal system: Abdomen is mildly distended, has mild generalized tenderness, has bowel sounds  Central nervous system: Alert and oriented Extremities: No edema, no clubbing ,no cyanosis, surgical wound on the right knee with the stitches Skin: No rashes, no ulcers,no icterus      Data Reviewed: I have personally reviewed following labs and imaging studies  CBC: Recent Labs  Lab 06/03/21 0921 06/04/21 0435 06/05/21 0422  WBC 10.1 14.7* 16.3*  NEUTROABS 8.9*  --   --   HGB 13.7 13.1 12.0  HCT 42.3 41.4 37.0  MCV 88.9 89.4 88.9  PLT 357 327 [id number]   Basic Metabolic Panel: Recent Labs  Lab 06/03/21 0921  06/04/21 0435  NA 143 143  K 4.1 4.8  CL 109 104  CO2 25 28  GLUCOSE 107* 87  BUN 28* 23*  CREATININE 0.62 0.66  CALCIUM 9.6 9.1   GFR: Estimated Creatinine Clearance: 68.1 mL/min (by C-G formula based on SCr of 0.66 mg/dL). Liver Function Tests: Recent Labs  Lab 06/03/21 0921 06/04/21 0435  AST 24 16  ALT 14 11  ALKPHOS 120 97  BILITOT 1.4* 1.0  PROT 7.2 6.6  ALBUMIN 3.8 3.1*   Recent Labs  Lab 06/03/21 0921  LIPASE 22   No results for input(s): AMMONIA in the last 168 hours. Coagulation Profile: No results for input(s): INR, PROTIME in the last 168 hours. Cardiac Enzymes: No results for input(s): CKTOTAL, CKMB, CKMBINDEX, TROPONINI in the last 168 hours. BNP (last 3 results) No results for input(s): PROBNP in the last 8760 hours. HbA1C: No results for input(s): HGBA1C in the last 72 hours. CBG: No results for input(s): GLUCAP in the last 168 hours. Lipid Profile: No results for input(s): CHOL, HDL, LDLCALC, TRIG, CHOLHDL, LDLDIRECT in the last 72 hours. Thyroid Function Tests: No results for input(s): TSH, T4TOTAL, FREET4, T3FREE, THYROIDAB in the last 72 hours. Anemia Panel: No results for input(s): VITAMINB12, FOLATE, FERRITIN, TIBC, IRON, RETICCTPCT in the last 72 hours. Sepsis Labs: No results for input(s): PROCALCITON, LATICACIDVEN in the last 168 hours.  Recent Results (from the past 240 hour(s))  Resp Panel by RT-PCR (Flu A&B, Covid) Nasopharyngeal Swab     Status: None   Collection Time: 06/03/21  9:51 AM   Specimen: Nasopharyngeal Swab; Nasopharyngeal(NP) swabs in vial transport medium  Result Value Ref Range Status   SARS Coronavirus 2 by RT PCR NEGATIVE NEGATIVE Final    Comment: (NOTE) SARS-CoV-2 target nucleic acids are NOT DETECTED.  The SARS-CoV-2 RNA is generally detectable in upper respiratory specimens during the acute phase of infection. The lowest concentration of SARS-CoV-2 viral copies this assay can detect is 138 copies/mL. A  negative result does not preclude SARS-Cov-2 infection and should not be used as the sole basis for treatment or other patient management decisions. A negative result may occur with  improper specimen collection/handling, submission of specimen other than nasopharyngeal swab, presence of viral mutation(s) within the areas targeted by this assay, and inadequate number of viral copies(<138 copies/mL). A negative result must be combined with clinical observations, patient history, and epidemiological information. The expected result is Negative.  Fact Sheet for Patients:  EntrepreneurPulse.com.au  Fact Sheet for Healthcare Providers:  IncredibleEmployment.be  This test is no t yet approved or cleared by the [place] FDA and  has been authorized for detection and/or diagnosis of SARS-CoV-2 by FDA under an Emergency  Use Authorization (EUA). This EUA will remain  in effect (meaning this test can be used) for the duration of the COVID-19 declaration under Section 564(b)(1) of the Act, 21 U.S.C.section 360bbb-3(b)(1), unless the authorization is terminated  or revoked sooner.       Influenza A by PCR NEGATIVE NEGATIVE Final   Influenza B by PCR NEGATIVE NEGATIVE Final    Comment: (NOTE) The Xpert Xpress SARS-CoV-2/FLU/RSV plus assay is intended as an aid in the diagnosis of influenza from Nasopharyngeal swab specimens and should not be used as a sole basis for treatment. Nasal washings and aspirates are unacceptable for Xpert Xpress SARS-CoV-2/FLU/RSV testing.  Fact Sheet for Patients: EntrepreneurPulse.com.au  Fact Sheet for Healthcare Providers: IncredibleEmployment.be  This test is not yet approved or cleared by the [place] FDA and has been authorized for detection and/or diagnosis of SARS-CoV-2 by FDA under an Emergency Use Authorization (EUA). This EUA will remain in effect (meaning this test can  be used) for the duration of the COVID-19 declaration under Section 564(b)(1) of the Act, 21 U.S.C. section 360bbb-3(b)(1), unless the authorization is terminated or revoked.  Performed at [facility], [place] [address] [name],  [address]          Radiology Studies: CT Abdomen Pelvis Wo Contrast  Result Date: 06/03/2021 CLINICAL DATA:  Acute generalized abdominal pain. EXAM: CT ABDOMEN AND PELVIS WITHOUT CONTRAST TECHNIQUE: Multidetector CT imaging of the abdomen and pelvis was performed following the standard protocol without IV contrast. COMPARISON:  September 29, 2019. FINDINGS: Lower chest: Mild right posterior basilar subsegmental atelectasis is noted. Hepatobiliary: Cholelithiasis is noted. No biliary dilatation is noted. Liver is unremarkable. Pancreas: Unremarkable. No pancreatic ductal dilatation or surrounding inflammatory changes. Spleen: Normal in size without focal abnormality. Adrenals/Urinary Tract: Adrenal glands are unremarkable. Kidneys are normal, without renal calculi, focal lesion, or hydronephrosis. Bladder is unremarkable. Stomach/Bowel: Stomach and appendix are unremarkable. There is no evidence of bowel obstruction. There is again noted wall thickening involving the sigmoid colon with surrounding inflammatory changes concerning for sigmoid diverticulitis. There is an adjacent crescent-shaped air collection concerning for perforation. Also noted is mild pneumoperitoneum in the anterior portion of the central abdomen. Vascular/Lymphatic: Aortic atherosclerosis. No enlarged abdominal or pelvic lymph nodes. Reproductive: Uterus and bilateral adnexa are unremarkable. Other: No abdominal wall hernia or abnormality. No abdominopelvic ascites. Musculoskeletal: No acute or significant osseous findings. IMPRESSION: Moderate to severe sigmoid diverticulitis is noted with adjacent crescent-shaped air collection concerning for perforation. Also noted is mild  pneumoperitoneum in the anterior portion of the central abdomen also suggesting perforation. Critical Value/emergent results were called by telephone at the time of interpretation on 06/03/2021 at 12:33 pm to provider Lacretia Leigh , who verbally acknowledged these results. Cholelithiasis. Aortic Atherosclerosis (ICD10-I70.0). Electronically Signed   By: Marijo Conception M.D.   On: 06/03/2021 12:33   DG CHEST PORT 1 VIEW  Result Date: 06/03/2021 CLINICAL DATA:  Diffuse abdominal pain, nausea, vomiting EXAM: PORTABLE CHEST 1 VIEW COMPARISON:  01/02/2010 FINDINGS: The heart size and mediastinal contours are within normal limits. Mild, diffuse interstitial pulmonary opacity. The visualized skeletal structures are unremarkable. IMPRESSION: Mild, diffuse interstitial pulmonary opacity, which may reflect mild edema or atypical/viral infection. No focal airspace opacity. Electronically Signed   By: Eddie Candle M.D.   On: 06/03/2021 16:30   VAS [place] LOWER EXTREMITY VENOUS (DVT)  Result Date: 06/03/2021  Lower Venous DVT Study Patient Name:  ANNA MARIE C. Todorov  Date of Exam:  06/03/2021 Medical Rec #: [id number]            Accession #:    [id number] Date of Birth: 1961/02/03             Patient Gender: F Patient Age:   69 years Exam Location:  [facility] Procedure:      VAS [place] LOWER EXTREMITY VENOUS (DVT) Referring Phys: Cherylann Ratel --------------------------------------------------------------------------------  Indications: "DVT".  Risk Factors: Surgery Right total knee replacement 2 weeks ago - no complications. Comparison Study: No previous exams Performing Technologist: Jody Hill RVT, RDMS  Examination Guidelines: A complete evaluation includes B-mode imaging, spectral Doppler, color Doppler, and power Doppler as needed of all accessible portions of each vessel. Bilateral testing is considered an integral part of a complete examination. Limited examinations for reoccurring indications may be performed as  noted. The reflux portion of the exam is performed with the patient in reverse Trendelenburg.  +---------+---------------+---------+-----------+----------+-------------------+ RIGHT    CompressibilityPhasicitySpontaneityPropertiesThrombus Aging      +---------+---------------+---------+-----------+----------+-------------------+ CFV      Full           Yes      Yes                                      +---------+---------------+---------+-----------+----------+-------------------+ SFJ      Full                                                             +---------+---------------+---------+-----------+----------+-------------------+ FV Prox  Full           Yes      Yes                                      +---------+---------------+---------+-----------+----------+-------------------+ FV Mid   Full           Yes      Yes                                      +---------+---------------+---------+-----------+----------+-------------------+ FV DistalFull           Yes      Yes                                      +---------+---------------+---------+-----------+----------+-------------------+ PFV      Full                                                             +---------+---------------+---------+-----------+----------+-------------------+ POP      Full           Yes      Yes                                      +---------+---------------+---------+-----------+----------+-------------------+  PTV      Full                                         Not well visualized +---------+---------------+---------+-----------+----------+-------------------+ PERO     Full                                         Not well visualized +---------+---------------+---------+-----------+----------+-------------------+   +----+---------------+---------+-----------+----------+--------------+ LEFTCompressibilityPhasicitySpontaneityPropertiesThrombus Aging  +----+---------------+---------+-----------+----------+--------------+ CFV Full           Yes      Yes                                 +----+---------------+---------+-----------+----------+--------------+    Summary: RIGHT: - There is no evidence of deep vein thrombosis in the lower extremity. - There is no evidence of superficial venous thrombosis.  - No cystic structure found in the popliteal fossa.  LEFT: - No evidence of common femoral vein obstruction.  *See table(s) above for measurements and observations.    Preliminary         Scheduled Meds:  acetaminophen  1,000 mg Oral Q6H   enoxaparin (LOVENOX) injection  40 mg Subcutaneous Q24H   escitalopram  20 mg Oral Daily   gabapentin  800 mg Oral TID   LORazepam  0.5 mg Oral q morning   LORazepam  1 mg Oral QPM   metoprolol tartrate  12.5 mg Oral Daily   predniSONE  10 mg Oral Q breakfast   Continuous Infusions:  lactated ringers 125 mL/hr at 06/04/21 2211   piperacillin-tazobactam (ZOSYN)  IV 3.375 g (06/05/21 0400)     LOS: 2 days    Time spent: 25 mins.More than 50% of that time was spent in counseling and/or coordination of care.      Shelly Coss, MD [facility] P9/18/2022, 7:36 AM

## 2021-06-05 NOTE — Progress Notes (Signed)
Patient ID: Brittany Hobbs, female   DOB: 03/08/1961, 60 y.o.   MRN: [id number]   Acute Care Surgery Service Progress Note:    Chief Complaint/Subjective: Feels much better   Objective: Vital signs in last 24 hours: Temp:  [97.5 F (36.4 C)-98.7 F (37.1 C)] 97.9 F (36.6 C) (09/18 0624) Pulse Rate:  [68-103] 83 (09/18 0624) Resp:  [18] 18 (09/18 0624) BP: (116-159)/(62-82) 159/78 (09/18 0624) SpO2:  [86 %-100 %] 96 % (09/18 0624) Last BM Date: 06/03/21  Intake/Output from previous day: 09/17 0701 - 09/18 0700 In: 3346.2 [P.O.:720; I.V.:2465.6; IV Piggyback:160.5] Out: 500 [Urine:500] Intake/Output this shift: No intake/output data recorded.  Lungs: nonlabored  Abd: soft, a min distention, TTP in lLLQ only; no guarding  Neuro: alert, nonfocal  Lab Results: CBC  Recent Labs    06/04/21 0435 06/05/21 0422  WBC 14.7* 16.3*  HGB 13.1 12.0  HCT 41.4 37.0  PLT 327 336    BMET Recent Labs    06/03/21 0921 06/04/21 0435  NA 143 143  K 4.1 4.8  CL 109 104  CO2 25 28  GLUCOSE 107* 87  BUN 28* 23*  CREATININE 0.62 0.66  CALCIUM 9.6 9.1    LFT Hepatic Function Latest Ref Rng & Units 06/04/2021 06/03/2021 10/02/2019  Total Protein 6.5 - 8.1 g/dL 6.6 7.2 6.0(L)  Albumin 3.5 - 5.0 g/dL 3.1(L) 3.8 2.8(L)  AST 15 - 41 U/L 16 24 45(H)  ALT 0 - 44 U/L 11 14 43  Alk Phosphatase 38 - 126 U/L 97 120 141(H)  Total Bilirubin 0.3 - 1.2 mg/dL 1.0 1.4(H) 0.9   PT/INR No results for input(s): LABPROT, INR in the last 72 hours. ABG No results for input(s): PHART, HCO3 in the last 72 hours.  Invalid input(s): PCO2, PO2  Studies/Results:  Anti-infectives: Anti-infectives (From admission, onward)    Start     Dose/Rate Route Frequency Ordered Stop   06/03/21 2000  piperacillin-tazobactam (ZOSYN) IVPB 3.375 g        3.375 g 12.5 mL/hr over 240 Minutes Intravenous Every 8 hours 06/03/21 1528     06/03/21 1330  piperacillin-tazobactam (ZOSYN) IVPB 3.375 g         3.375 g 100 mL/hr over 30 Minutes Intravenous  Once 06/03/21 1315 06/03/21 1527       Medications: Scheduled Meds:  acetaminophen  1,000 mg Oral Q6H   enoxaparin (LOVENOX) injection  40 mg Subcutaneous Q24H   escitalopram  20 mg Oral Daily   gabapentin  800 mg Oral TID   LORazepam  0.5 mg Oral q morning   LORazepam  1 mg Oral QPM   metoprolol tartrate  12.5 mg Oral Daily   predniSONE  10 mg Oral Q breakfast   Continuous Infusions:  lactated ringers 125 mL/hr at 06/04/21 2211   piperacillin-tazobactam (ZOSYN)  IV 3.375 g (06/05/21 0400)   PRN Meds:.HYDROmorphone (DILAUDID) injection, morphine injection, ondansetron **OR** ondansetron (ZOFRAN) IV  Assessment/Plan: Patient Active Problem List   Diagnosis Date Noted   Status post right knee replacement 05/20/2021   Unilateral primary osteoarthritis, right knee 05/19/2021   Effusion of right knee 02/03/2021   Acquired trigger finger of right ring finger 10/26/2020   Acute diverticulitis 09/30/2019   E coli bacteremia 09/30/2019   Thrombocytopenia ([place]) 09/30/2019   AKI (acute kidney injury) ([place]) 09/30/2019   LFT elevation 09/30/2019   Bacteriuria 08/11/2018   Hydronephrosis, left 08/11/2018   Diverticulitis 08/10/2018   Colonic diverticular abscess 08/10/2018  Lupus ([place]) 08/10/2018   Hyperlipidemia 08/10/2018   HTN (hypertension) 08/10/2018   Anxiety 08/10/2018   Diverticulitis with perforation, contained  FEN- adv to CLD, IVF VTE- lovenox ID- Zosyn 9/16>  Disposition: wbc up some today, pt has no fever, less tachy, more comfortable, less tender - will advance to fulls; ambulate in hall  WBC trending up, may be forming an abscess.  Too early to rescan currently. Repeat WBC in AM Will discuss with primary team about reducing or holding prednisone  LOS: 2 days    Rosario Adie, MD  [facility]

## 2021-06-06 DIAGNOSIS — K5792 Diverticulitis of intestine, part unspecified, without perforation or abscess without bleeding: Secondary | ICD-10-CM | POA: Diagnosis not present

## 2021-06-06 LAB — CBC WITH DIFFERENTIAL/PLATELET
Abs Immature Granulocytes: 0.08 10*3/uL — ABNORMAL HIGH (ref 0.00–0.07)
Basophils Absolute: 0 10*3/uL (ref 0.0–0.1)
Basophils Relative: 0 %
Eosinophils Absolute: 0 10*3/uL (ref 0.0–0.5)
Eosinophils Relative: 0 %
HCT: 38.4 % (ref 36.0–46.0)
Hemoglobin: 12.3 g/dL (ref 12.0–15.0)
Immature Granulocytes: 1 %
Lymphocytes Relative: 9 %
Lymphs Abs: 1.2 10*3/uL (ref 0.7–4.0)
MCH: 28.5 pg (ref 26.0–34.0)
MCHC: 32 g/dL (ref 30.0–36.0)
MCV: 88.9 fL (ref 80.0–100.0)
Monocytes Absolute: 0.8 10*3/uL (ref 0.1–1.0)
Monocytes Relative: 6 %
Neutro Abs: 11.5 10*3/uL — ABNORMAL HIGH (ref 1.7–7.7)
Neutrophils Relative %: 84 %
Platelets: 334 10*3/uL (ref 150–400)
RBC: 4.32 MIL/uL (ref 3.87–5.11)
RDW: 16.2 % — ABNORMAL HIGH (ref 11.5–15.5)
WBC: 13.6 10*3/uL — ABNORMAL HIGH (ref 4.0–10.5)
nRBC: 0 % (ref 0.0–0.2)

## 2021-06-06 LAB — BASIC METABOLIC PANEL
Anion gap: 10 (ref 5–15)
BUN: 19 mg/dL (ref 6–20)
CO2: 27 mmol/L (ref 22–32)
Calcium: 8.7 mg/dL — ABNORMAL LOW (ref 8.9–10.3)
Chloride: 103 mmol/L (ref 98–111)
Creatinine, Ser: 0.64 mg/dL (ref 0.44–1.00)
GFR, Estimated: >60 mL/min (ref >60)
Glucose, Bld: 97 mg/dL (ref 70–99)
Potassium: 3.8 mmol/L (ref 3.5–5.1)
Sodium: 140 mmol/L (ref 135–145)

## 2021-06-06 NOTE — Progress Notes (Signed)
   Progress Note     Subjective: Patient reports she still has some LLQ pain but not as severe as it was. She is passing flatus and had some loose stool yesterday. No blood or mucus in stool. She denies nausea or worsened pain with FLD.   Objective: Vital signs in last 24 hours: Temp:  [97.8 F (36.6 C)-99.4 F (37.4 C)] 98.8 F (37.1 C) (09/19 0334) Pulse Rate:  [72-107] 106 (09/19 0334) Resp:  [16-18] 18 (09/19 0334) BP: (131-170)/(71-103) 131/71 (09/19 0334) SpO2:  [77 %-95 %] 95 % (09/19 0334) Last BM Date: 06/03/21  Intake/Output from previous day: 09/18 0701 - 09/19 0700 In: 3993.9 [P.O.:360; I.V.:3453.8; IV Piggyback:180.1] Out: 1200 [Urine:1200] Intake/Output this shift: No intake/output data recorded.  PE: General: pleasant, WD, overweight female who is laying in bed in NAD Heart: regular, rate, and rhythm.   Lungs: CTAB, no wheezes, rhonchi, or rales noted.  Respiratory effort nonlabored Abd: soft, ttp in LLQ with some voluntary guarding, no peritonitis, mild distention, +BS Psych: A&Ox3 with an appropriate affect.    Lab Results:  Recent Labs    06/05/21 0422 06/06/21 0339  WBC 16.3* 13.6*  HGB 12.0 12.3  HCT 37.0 38.4  PLT 336 334   BMET Recent Labs    06/04/21 0435 06/06/21 0339  NA 143 140  K 4.8 3.8  CL 104 103  CO2 28 27  GLUCOSE 87 97  BUN 23* 19  CREATININE 0.66 0.64  CALCIUM 9.1 8.7*   PT/INR No results for input(s): LABPROT, INR in the last 72 hours. CMP     Component Value Date/Time   NA 140 06/06/2021 0339   K 3.8 06/06/2021 0339   CL 103 06/06/2021 0339   CO2 27 06/06/2021 0339   GLUCOSE 97 06/06/2021 0339   BUN 19 06/06/2021 0339   CREATININE 0.64 06/06/2021 0339   CALCIUM 8.7 (L) 06/06/2021 0339   PROT 6.6 06/04/2021 0435   ALBUMIN 3.1 (L) 06/04/2021 0435   AST 16 06/04/2021 0435   ALT 11 06/04/2021 0435   ALKPHOS 97 06/04/2021 0435   BILITOT 1.0 06/04/2021 0435   GFRNONAA >60 06/06/2021 0339   GFRAA >60 10/02/2019  0445   Lipase     Component Value Date/Time   LIPASE 22 06/03/2021 0921       Studies/Results: No results found.  Anti-infectives: Anti-infectives (From admission, onward)    Start     Dose/Rate Route Frequency Ordered Stop   06/03/21 2000  piperacillin-tazobactam (ZOSYN) IVPB 3.375 g        3.375 g 12.5 mL/hr over 240 Minutes Intravenous Every 8 hours 06/03/21 1528     06/03/21 1330  piperacillin-tazobactam (ZOSYN) IVPB 3.375 g        3.375 g 100 mL/hr over 30 Minutes Intravenous  Once 06/03/21 1315 06/03/21 1527        Assessment/Plan Diverticulitis with contained perforation - WBC down to 13, VSS - patient tolerating FLD and having bowel function - advance to soft diet today and see how she tolerates - repeat CBC in AM - will discuss possible repeat CT tomorrow AM with MD - no indication for emergent surgical intervention at this time  FEN: low fiber/ IVF per TRH VTE: LMWH ID: Zosyn 9/16>>  Arthritis Lupus HLD Panic disorder   LOS: 3 days    Norm Parcel, [facility] Surgery 06/06/2021, 9:26 AM Please see Amion for pager number during day hours 7:00am-4:30pm

## 2021-06-06 NOTE — Progress Notes (Signed)
PROGRESS NOTE    Brittany Hobbs  [id number] DOB: 1961-04-20 DOA: 06/03/2021 PCP: Berkley Harvey, NP   Chief Complain: Abdominal pain  Brief Narrative:  Patient is a 60 year old female with history of anxiety, lupus who presented with abdominal pain.  She has recent history of total knee replacement.  Imagings of the abdomen done on presentation showed diverticulitis with pneumoperitoneum suggesting perforation.  General surgery consulted and following.  Currently on conservative management with IV antibiotics.  Started on soft diet today.  Assessment & Plan:   Active Problems:   Acute diverticulitis   Acute diverticulitis with perforation: Started on Zosyn, continue the same for now.  General surgery following.   She states she is having loose bowel movements.  Passing gas. Says her abd pain is better  Leukocytosis: Mild elevation.  Patient is afebrile. Continue to monitor  Right lower extremity edema: Recent history of TKA.  Has stitches.Follow-up with orthopedics recommended as an outpatient.  Doppler study did not show DVT.  Anxiety: On anxiety medications as needed  Lupus: Resume home medications,on prednisone 10 mg daily          DVT prophylaxis:Lovenox Code Status: Full Family Communication: None at bedside Status is: Inpatient  Remains inpatient appropriate because:Inpatient level of care appropriate due to severity of illness  Dispo: The patient is from: Home              Anticipated d/c is to: Home              Patient currently is not medically stable to d/c.   Difficult to place patient No      Consultants: General surgery  Procedures:None  Antimicrobials:  Anti-infectives (From admission, onward)    Start     Dose/Rate Route Frequency Ordered Stop   06/03/21 2000  piperacillin-tazobactam (ZOSYN) IVPB 3.375 g        3.375 g 12.5 mL/hr over 240 Minutes Intravenous Every 8 hours 06/03/21 1528     06/03/21 1330  piperacillin-tazobactam  (ZOSYN) IVPB 3.375 g        3.375 g 100 mL/hr over 30 Minutes Intravenous  Once 06/03/21 1315 06/03/21 1527       Subjective:  Patient seen and examined at bedside this morning.  Hemodynamically stable.Her abd pain is better today.Having bowel movements.  Objective: Vitals:   06/05/21 1342 06/05/21 2132 06/05/21 2200 06/06/21 0334  BP: (!) 170/86 (!) 154/103  131/71  Pulse: 72 97 (!) 107 (!) 106  Resp: '18 16  18  '$ Temp: 97.8 F (36.6 C) 99.4 F (37.4 C)  98.8 F (37.1 C)  TempSrc: Oral Oral  Oral  SpO2: 95% (!) 77% 94% 95%  Weight:      Height:        Intake/Output Summary (Last 24 hours) at 06/06/2021 0736 Last data filed at 06/06/2021 0600 Gross per 24 hour  Intake 3993.87 ml  Output 1200 ml  Net 2793.87 ml   Filed Weights   06/03/21 1525  Weight: 64 kg    Examination:  General exam: Overall comfortable, not in distress HEENT: PERRL Respiratory system:  no wheezes or crackles  Cardiovascular system: S1 & S2 heard, RRR.  Gastrointestinal system: Abdomen is mildly distended, soft and has generalized tenderness Central nervous system: Alert and oriented Extremities: No edema, no clubbing ,no cyanosis Skin: No rashes, no ulcers,no icterus     Data Reviewed: I have personally reviewed following labs and imaging studies  CBC: Recent Labs  Lab 06/03/21  [id number] 06/04/21 0435 06/05/21 0422 06/06/21 0339  WBC 10.1 14.7* 16.3* 13.6*  NEUTROABS 8.9*  --   --  11.5*  HGB 13.7 13.1 12.0 12.3  HCT 42.3 41.4 37.0 38.4  MCV 88.9 89.4 88.9 88.9  PLT 357 327 336 [id number]   Basic Metabolic Panel: Recent Labs  Lab 06/03/21 0921 06/04/21 0435 06/06/21 0339  NA 143 143 140  K 4.1 4.8 3.8  CL 109 104 103  CO2 '25 28 27  '$ GLUCOSE 107* 87 97  BUN 28* 23* 19  CREATININE 0.62 0.66 0.64  CALCIUM 9.6 9.1 8.7*   GFR: Estimated Creatinine Clearance: 68.1 mL/min (by C-G formula based on SCr of 0.64 mg/dL). Liver Function Tests: Recent Labs  Lab 06/03/21 0921 06/04/21 0435   AST 24 16  ALT 14 11  ALKPHOS 120 97  BILITOT 1.4* 1.0  PROT 7.2 6.6  ALBUMIN 3.8 3.1*   Recent Labs  Lab 06/03/21 0921  LIPASE 22   No results for input(s): AMMONIA in the last 168 hours. Coagulation Profile: No results for input(s): INR, PROTIME in the last 168 hours. Cardiac Enzymes: No results for input(s): CKTOTAL, CKMB, CKMBINDEX, TROPONINI in the last 168 hours. BNP (last 3 results) No results for input(s): PROBNP in the last 8760 hours. HbA1C: No results for input(s): HGBA1C in the last 72 hours. CBG: No results for input(s): GLUCAP in the last 168 hours. Lipid Profile: No results for input(s): CHOL, HDL, LDLCALC, TRIG, CHOLHDL, LDLDIRECT in the last 72 hours. Thyroid Function Tests: No results for input(s): TSH, T4TOTAL, FREET4, T3FREE, THYROIDAB in the last 72 hours. Anemia Panel: No results for input(s): VITAMINB12, FOLATE, FERRITIN, TIBC, IRON, RETICCTPCT in the last 72 hours. Sepsis Labs: No results for input(s): PROCALCITON, LATICACIDVEN in the last 168 hours.  Recent Results (from the past 240 hour(s))  Resp Panel by RT-PCR (Flu A&B, Covid) Nasopharyngeal Swab     Status: None   Collection Time: 06/03/21  9:51 AM   Specimen: Nasopharyngeal Swab; Nasopharyngeal(NP) swabs in vial transport medium  Result Value Ref Range Status   SARS Coronavirus 2 by RT PCR NEGATIVE NEGATIVE Final    Comment: (NOTE) SARS-CoV-2 target nucleic acids are NOT DETECTED.  The SARS-CoV-2 RNA is generally detectable in upper respiratory specimens during the acute phase of infection. The lowest concentration of SARS-CoV-2 viral copies this assay can detect is 138 copies/mL. A negative result does not preclude SARS-Cov-2 infection and should not be used as the sole basis for treatment or other patient management decisions. A negative result may occur with  improper specimen collection/handling, submission of specimen other than nasopharyngeal swab, presence of viral mutation(s)  within the areas targeted by this assay, and inadequate number of viral copies(<138 copies/mL). A negative result must be combined with clinical observations, patient history, and epidemiological information. The expected result is Negative.  Fact Sheet for Patients:  EntrepreneurPulse.com.au  Fact Sheet for Healthcare Providers:  IncredibleEmployment.be  This test is no t yet approved or cleared by the [place] FDA and  has been authorized for detection and/or diagnosis of SARS-CoV-2 by FDA under an Emergency Use Authorization (EUA). This EUA will remain  in effect (meaning this test can be used) for the duration of the COVID-19 declaration under Section 564(b)(1) of the Act, 21 U.S.C.section 360bbb-3(b)(1), unless the authorization is terminated  or revoked sooner.       Influenza A by PCR NEGATIVE NEGATIVE Final   Influenza B by PCR NEGATIVE NEGATIVE Final  Comment: (NOTE) The Xpert Xpress SARS-CoV-2/FLU/RSV plus assay is intended as an aid in the diagnosis of influenza from Nasopharyngeal swab specimens and should not be used as a sole basis for treatment. Nasal washings and aspirates are unacceptable for Xpert Xpress SARS-CoV-2/FLU/RSV testing.  Fact Sheet for Patients: EntrepreneurPulse.com.au  Fact Sheet for Healthcare Providers: IncredibleEmployment.be  This test is not yet approved or cleared by the [place] FDA and has been authorized for detection and/or diagnosis of SARS-CoV-2 by FDA under an Emergency Use Authorization (EUA). This EUA will remain in effect (meaning this test can be used) for the duration of the COVID-19 declaration under Section 564(b)(1) of the Act, 21 U.S.C. section 360bbb-3(b)(1), unless the authorization is terminated or revoked.  Performed at [facility], [place] [address] [place], [place] [address]          Radiology Studies: No  results found.      Scheduled Meds:  acetaminophen  1,000 mg Oral Q6H   enoxaparin (LOVENOX) injection  40 mg Subcutaneous Q24H   escitalopram  20 mg Oral Daily   gabapentin  800 mg Oral TID   LORazepam  0.5 mg Oral q morning   LORazepam  1 mg Oral QPM   metoprolol tartrate  12.5 mg Oral Daily   predniSONE  10 mg Oral Q breakfast   Continuous Infusions:  lactated ringers 125 mL/hr at 06/05/21 1808   piperacillin-tazobactam (ZOSYN)  IV 3.375 g (06/06/21 0327)     LOS: 3 days    Time spent: 25 mins.More than 50% of that time was spent in counseling and/or coordination of care.      Shelly Coss, MD [facility] P9/19/2022, 7:36 AM

## 2021-06-07 DIAGNOSIS — K5792 Diverticulitis of intestine, part unspecified, without perforation or abscess without bleeding: Secondary | ICD-10-CM | POA: Diagnosis not present

## 2021-06-07 LAB — CBC
HCT: 37.7 % (ref 36.0–46.0)
Hemoglobin: 11.9 g/dL — ABNORMAL LOW (ref 12.0–15.0)
MCH: 28.1 pg (ref 26.0–34.0)
MCHC: 31.6 g/dL (ref 30.0–36.0)
MCV: 89.1 fL (ref 80.0–100.0)
Platelets: 305 10*3/uL (ref 150–400)
RBC: 4.23 MIL/uL (ref 3.87–5.11)
RDW: 16.2 % — ABNORMAL HIGH (ref 11.5–15.5)
WBC: 11.6 10*3/uL — ABNORMAL HIGH (ref 4.0–10.5)
nRBC: 0 % (ref 0.0–0.2)

## 2021-06-07 NOTE — Progress Notes (Signed)
Mobility Specialist - Progress Note    06/07/21 1101  Mobility  Activity Ambulated in hall  Level of Assistance Contact guard assist, steadying assist  [place] wheel walker  Distance Ambulated (ft) 40 ft  Mobility Ambulated with assistance in hallway  Mobility Response Tolerated well  Mobility performed by Mobility specialist  $Mobility charge 1 Mobility    Pt ambulated ~40 ft in hallway using RW and stated feeling very "gassy and constipated" throughout session. Returned to room to use bathroom and was left in bed with call bell at side.   [place] Specialist [facility] Phone: [phone] 06/07/21, 11:02 AM

## 2021-06-07 NOTE — Progress Notes (Signed)
PROGRESS NOTE    Brittany Hobbs  CBJ:[id number] DOB: 06-15-1961 DOA: 06/03/2021 PCP: Berkley Harvey, NP   Chief Complain: Abdominal pain  Brief Narrative:  Patient is a 60 year old female with history of anxiety, lupus who presented with abdominal pain.  She has recent history of total knee replacement.  Imagings of the abdomen done on presentation showed diverticulitis with pneumoperitoneum suggesting perforation.  General surgery consulted and following.  Currently on conservative management with IV antibiotics.  Overall condition has improved.  Started on soft diet today with plan for discharge tomorrow  Assessment & Plan:   Active Problems:   Acute diverticulitis   Acute diverticulitis with perforation: Started on Zosyn, continue the same for now.  General surgery following.   Her abdominal pain is significantly better today.  She is having bowel movements, passing gas.  Diet advanced to soft today with plan for discharge tomorrow if she continues to tolerate.  Leukocytosis: Mild elevation.  Patient is afebrile. Continue to monitor  Right lower extremity edema: Recent history of TKA.  Has stitches.Follow-up with orthopedics recommended as an outpatient.  Doppler study did not show DVT.  Anxiety: On anxiety medications as needed  Lupus: Resumed home medication  prednisone 10 mg daily          DVT prophylaxis:Lovenox Code Status: Full Family Communication: None at bedside Status is: Inpatient  Remains inpatient appropriate because:Inpatient level of care appropriate due to severity of illness  Dispo: The patient is from: Home              Anticipated d/c is to: Home              Patient currently is not medically stable to d/c.   Difficult to place patient No      Consultants: General surgery  Procedures:None  Antimicrobials:  Anti-infectives (From admission, onward)    Start     Dose/Rate Route Frequency Ordered Stop   06/03/21 2000   piperacillin-tazobactam (ZOSYN) IVPB 3.375 g        3.375 g 12.5 mL/hr over 240 Minutes Intravenous Every 8 hours 06/03/21 1528     06/03/21 1330  piperacillin-tazobactam (ZOSYN) IVPB 3.375 g        3.375 g 100 mL/hr over 30 Minutes Intravenous  Once 06/03/21 1315 06/03/21 1527       Subjective:  Patient seen and examined at the bedside this morning.  Hemodynamically stable.  Feels much better today.  Denies any abdominal pain.  Tolerating the full liquid diet.  Having bowel movements.  Objective: Vitals:   06/06/21 1337 06/06/21 1806 06/06/21 2117 06/07/21 0504  BP: 137/74 (!) 143/69 130/73 126/73  Pulse: 71 70 85 76  Resp: 14 15 16 16   Temp: 97.9 F (36.6 C) 97.6 F (36.4 C) 97.6 F (36.4 C) 98.4 F (36.9 C)  TempSrc: Oral Oral Oral Oral  SpO2: 100% 99% 92% 99%  Weight:      Height:        Intake/Output Summary (Last 24 hours) at 06/07/2021 0800 Last data filed at 06/07/2021 0600 Gross per 24 hour  Intake 3207.81 ml  Output 1200 ml  Net 2007.81 ml   Filed Weights   06/03/21 1525  Weight: 64 kg    Examination:  General exam: Overall comfortable, not in distress HEENT: PERRL Respiratory system:  no wheezes or crackles  Cardiovascular system: S1 & S2 heard, RRR.  Gastrointestinal system: Abdomen is nondistended, soft and nontender.  Good bowel sounds Central nervous  system: Alert and oriented Extremities: No edema, no clubbing ,no cyanosis Skin: No rashes, no ulcers,no icterus     Data Reviewed: I have personally reviewed following labs and imaging studies  CBC: Recent Labs  Lab 06/03/21 0921 06/04/21 0435 06/05/21 0422 06/06/21 0339 06/07/21 0419  WBC 10.1 14.7* 16.3* 13.6* 11.6*  NEUTROABS 8.9*  --   --  11.5*  --   HGB 13.7 13.1 12.0 12.3 11.9*  HCT 42.3 41.4 37.0 38.4 37.7  MCV 88.9 89.4 88.9 88.9 89.1  PLT 357 327 336 334 710   Basic Metabolic Panel: Recent Labs  Lab 06/03/21 0921 06/04/21 0435 06/06/21 0339  NA 143 143 140  K 4.1 4.8  3.8  CL 109 104 103  CO2 25 28 27   GLUCOSE 107* 87 97  BUN 28* 23* 19  CREATININE 0.62 0.66 0.64  CALCIUM 9.6 9.1 8.7*   GFR: Estimated Creatinine Clearance: 68.1 mL/min (by C-G formula based on SCr of 0.64 mg/dL). Liver Function Tests: Recent Labs  Lab 06/03/21 0921 06/04/21 0435  AST 24 16  ALT 14 11  ALKPHOS 120 97  BILITOT 1.4* 1.0  PROT 7.2 6.6  ALBUMIN 3.8 3.1*   Recent Labs  Lab 06/03/21 0921  LIPASE 22   No results for input(s): AMMONIA in the last 168 hours. Coagulation Profile: No results for input(s): INR, PROTIME in the last 168 hours. Cardiac Enzymes: No results for input(s): CKTOTAL, CKMB, CKMBINDEX, TROPONINI in the last 168 hours. BNP (last 3 results) No results for input(s): PROBNP in the last 8760 hours. HbA1C: No results for input(s): HGBA1C in the last 72 hours. CBG: No results for input(s): GLUCAP in the last 168 hours. Lipid Profile: No results for input(s): CHOL, HDL, LDLCALC, TRIG, CHOLHDL, LDLDIRECT in the last 72 hours. Thyroid Function Tests: No results for input(s): TSH, T4TOTAL, FREET4, T3FREE, THYROIDAB in the last 72 hours. Anemia Panel: No results for input(s): VITAMINB12, FOLATE, FERRITIN, TIBC, IRON, RETICCTPCT in the last 72 hours. Sepsis Labs: No results for input(s): PROCALCITON, LATICACIDVEN in the last 168 hours.  Recent Results (from the past 240 hour(s))  Resp Panel by RT-PCR (Flu A&B, Covid) Nasopharyngeal Swab     Status: None   Collection Time: 06/03/21  9:51 AM   Specimen: Nasopharyngeal Swab; Nasopharyngeal(NP) swabs in vial transport medium  Result Value Ref Range Status   SARS Coronavirus 2 by RT PCR NEGATIVE NEGATIVE Final    Comment: (NOTE) SARS-CoV-2 target nucleic acids are NOT DETECTED.  The SARS-CoV-2 RNA is generally detectable in upper respiratory specimens during the acute phase of infection. The lowest concentration of SARS-CoV-2 viral copies this assay can detect is 138 copies/mL. A negative result  does not preclude SARS-Cov-2 infection and should not be used as the sole basis for treatment or other patient management decisions. A negative result may occur with  improper specimen collection/handling, submission of specimen other than nasopharyngeal swab, presence of viral mutation(s) within the areas targeted by this assay, and inadequate number of viral copies(<138 copies/mL). A negative result must be combined with clinical observations, patient history, and epidemiological information. The expected result is Negative.  Fact Sheet for Patients:  EntrepreneurPulse.com.au  Fact Sheet for Healthcare Providers:  IncredibleEmployment.be  This test is no t yet approved or cleared by the [place] FDA and  has been authorized for detection and/or diagnosis of SARS-CoV-2 by FDA under an Emergency Use Authorization (EUA). This EUA will remain  in effect (meaning this test can be used) for  the duration of the COVID-19 declaration under Section 564(b)(1) of the Act, 21 U.S.C.section 360bbb-3(b)(1), unless the authorization is terminated  or revoked sooner.       Influenza A by PCR NEGATIVE NEGATIVE Final   Influenza B by PCR NEGATIVE NEGATIVE Final    Comment: (NOTE) The Xpert Xpress SARS-CoV-2/FLU/RSV plus assay is intended as an aid in the diagnosis of influenza from Nasopharyngeal swab specimens and should not be used as a sole basis for treatment. Nasal washings and aspirates are unacceptable for Xpert Xpress SARS-CoV-2/FLU/RSV testing.  Fact Sheet for Patients: EntrepreneurPulse.com.au  Fact Sheet for Healthcare Providers: IncredibleEmployment.be  This test is not yet approved or cleared by the [place] FDA and has been authorized for detection and/or diagnosis of SARS-CoV-2 by FDA under an Emergency Use Authorization (EUA). This EUA will remain in effect (meaning this test can be used) for  the duration of the COVID-19 declaration under Section 564(b)(1) of the Act, 21 U.S.C. section 360bbb-3(b)(1), unless the authorization is terminated or revoked.  Performed at [facility], [place] [address], [place], [place] [address]          Radiology Studies: No results found.      Scheduled Meds:  acetaminophen  1,000 mg Oral Q6H   enoxaparin (LOVENOX) injection  40 mg Subcutaneous Q24H   escitalopram  20 mg Oral Daily   gabapentin  800 mg Oral TID   LORazepam  0.5 mg Oral q morning   LORazepam  1 mg Oral QPM   metoprolol tartrate  12.5 mg Oral Daily   predniSONE  10 mg Oral Q breakfast   Continuous Infusions:  lactated ringers 50 mL/hr at 06/07/21 0104   piperacillin-tazobactam (ZOSYN)  IV 3.375 g (06/07/21 0411)     LOS: 4 days    Time spent: 25 mins.More than 50% of that time was spent in counseling and/or coordination of care.      Shelly Coss, MD [facility] P9/20/2022, 8:00 AM

## 2021-06-07 NOTE — Progress Notes (Signed)
   Progress Note     Subjective: Patient reports pain is significantly improved this AM. She tolerated FLD and continued to have some loose BMs.   Objective: Vital signs in last 24 hours: Temp:  [97.6 F (36.4 C)-98.4 F (36.9 C)] 98.4 F (36.9 C) (09/20 0504) Pulse Rate:  [70-85] 76 (09/20 0504) Resp:  [14-16] 16 (09/20 0504) BP: (126-143)/(69-74) 126/73 (09/20 0504) SpO2:  [92 %-100 %] 99 % (09/20 0504) Last BM Date: 06/03/21  Intake/Output from previous day: 09/19 0701 - 09/20 0700 In: 3207.8 [P.O.:1560; I.V.:1510.6; IV Piggyback:137.2] Out: 1200 [Urine:1200] Intake/Output this shift: No intake/output data recorded.  PE: General: pleasant, WD, overweight female who is laying in bed in NAD Heart: regular, rate, and rhythm.   Lungs: CTAB, no wheezes, rhonchi, or rales noted.  Respiratory effort nonlabored Abd: soft, less ttp in LLQ with no voluntary guarding, no peritonitis, mild distention, +BS Psych: A&Ox3 with an appropriate affect.    Lab Results:  Recent Labs    06/06/21 0339 06/07/21 0419  WBC 13.6* 11.6*  HGB 12.3 11.9*  HCT 38.4 37.7  PLT 334 305   BMET Recent Labs    06/06/21 0339  NA 140  K 3.8  CL 103  CO2 27  GLUCOSE 97  BUN 19  CREATININE 0.64  CALCIUM 8.7*   PT/INR No results for input(s): LABPROT, INR in the last 72 hours. CMP     Component Value Date/Time   NA 140 06/06/2021 0339   K 3.8 06/06/2021 0339   CL 103 06/06/2021 0339   CO2 27 06/06/2021 0339   GLUCOSE 97 06/06/2021 0339   BUN 19 06/06/2021 0339   CREATININE 0.64 06/06/2021 0339   CALCIUM 8.7 (L) 06/06/2021 0339   PROT 6.6 06/04/2021 0435   ALBUMIN 3.1 (L) 06/04/2021 0435   AST 16 06/04/2021 0435   ALT 11 06/04/2021 0435   ALKPHOS 97 06/04/2021 0435   BILITOT 1.0 06/04/2021 0435   GFRNONAA >60 06/06/2021 0339   GFRAA >60 10/02/2019 0445   Lipase     Component Value Date/Time   LIPASE 22 06/03/2021 0921       Studies/Results: No results  found.  Anti-infectives: Anti-infectives (From admission, onward)    Start     Dose/Rate Route Frequency Ordered Stop   06/03/21 2000  piperacillin-tazobactam (ZOSYN) IVPB 3.375 g        3.375 g 12.5 mL/hr over 240 Minutes Intravenous Every 8 hours 06/03/21 1528     06/03/21 1330  piperacillin-tazobactam (ZOSYN) IVPB 3.375 g        3.375 g 100 mL/hr over 30 Minutes Intravenous  Once 06/03/21 1315 06/03/21 1527        Assessment/Plan Diverticulitis with contained perforation - WBC down to 11, VSS - patient tolerating FLD and having bowel function - advance to soft diet today and see how she tolerates - repeat CBC in AM - no indication for emergent surgical intervention at this time - may be ready for discharge tomorrow if tolerating soft diet    FEN: low fiber/ IVF per TR[facility] VTE: LMWH ID: Zosyn 9/16>>   Arthritis Lupus HLD Panic disorder   LOS: 4 days    Norm Parcel, [facility] 06/07/2021, 8:48 AM Please see Amion for pager number during day hours 7:00am-4:30pm

## 2021-06-08 ENCOUNTER — Inpatient Hospital Stay (HOSPITAL_COMMUNITY): Payer: BC Managed Care – PPO

## 2021-06-08 ENCOUNTER — Encounter (HOSPITAL_COMMUNITY): Payer: Self-pay | Admitting: Internal Medicine

## 2021-06-08 ENCOUNTER — Encounter: Payer: Self-pay | Admitting: Orthopaedic Surgery

## 2021-06-08 DIAGNOSIS — K5792 Diverticulitis of intestine, part unspecified, without perforation or abscess without bleeding: Secondary | ICD-10-CM | POA: Diagnosis not present

## 2021-06-08 LAB — CBC
HCT: 37.3 % (ref 36.0–46.0)
Hemoglobin: 11.8 g/dL — ABNORMAL LOW (ref 12.0–15.0)
MCH: 28.1 pg (ref 26.0–34.0)
MCHC: 31.6 g/dL (ref 30.0–36.0)
MCV: 88.8 fL (ref 80.0–100.0)
Platelets: 325 10*3/uL (ref 150–400)
RBC: 4.2 MIL/uL (ref 3.87–5.11)
RDW: 16.4 % — ABNORMAL HIGH (ref 11.5–15.5)
WBC: 15.5 10*3/uL — ABNORMAL HIGH (ref 4.0–10.5)
nRBC: 0 % (ref 0.0–0.2)

## 2021-06-08 IMAGING — CT CT ABD-PELV W/O CM
2 of 4 series · 16 of 46 positions shown, 18 images · non-contrast
Comparison: [DATE]

CLINICAL DATA: Lower abdominal pain, history of diverticulitis,
question complication

EXAM:
CT ABDOMEN AND PELVIS WITHOUT CONTRAST
TECHNIQUE: Multidetector CT imaging of the abdomen and pelvis was performed
following the standard protocol without IV contrast. Sagittal and
coronal MPR images reconstructed from axial data set. No oral
contrast administered.

[Series 2: axial st · axial · 0.74mm/px · z∈[-413,-38]mm · 13 of 85 slices shown, 15 images]
[im 5/85  soft-tissue]
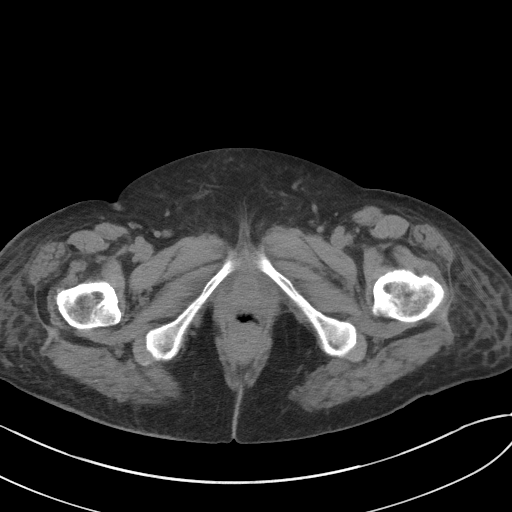
[im 5/85  bone]
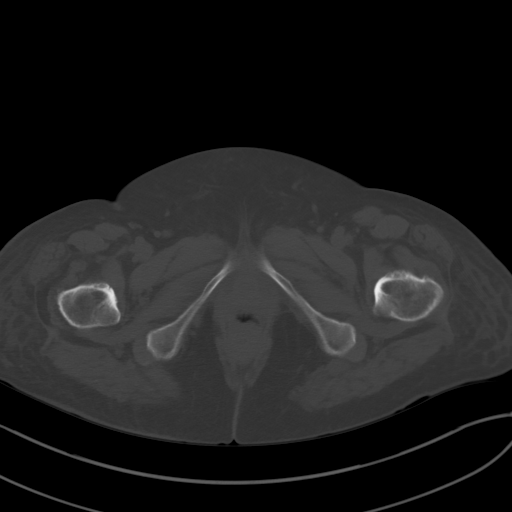
[im 10/85  soft-tissue]
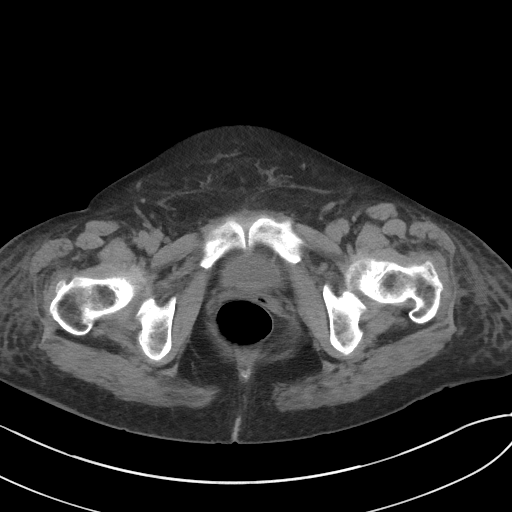
[im 19/85  soft-tissue]
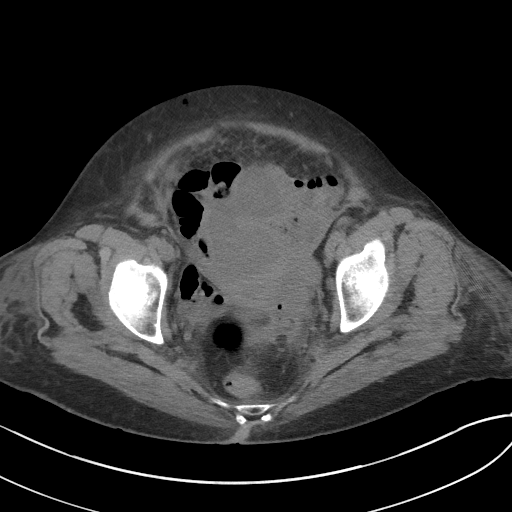
[im 24/85  soft-tissue]
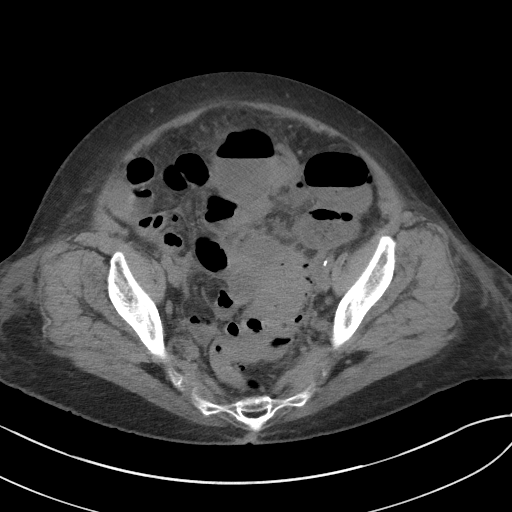
[im 29/85  soft-tissue]
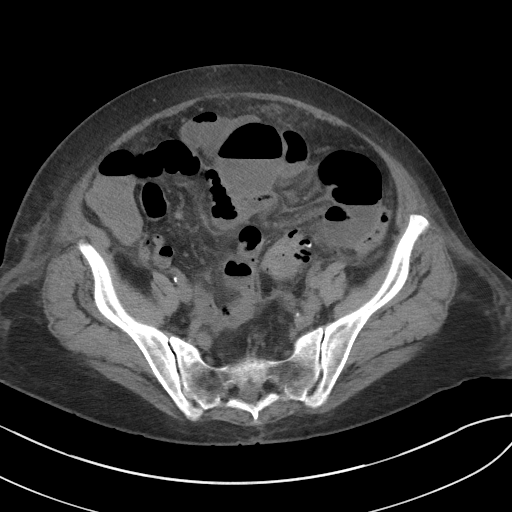
[im 38/85  soft-tissue]
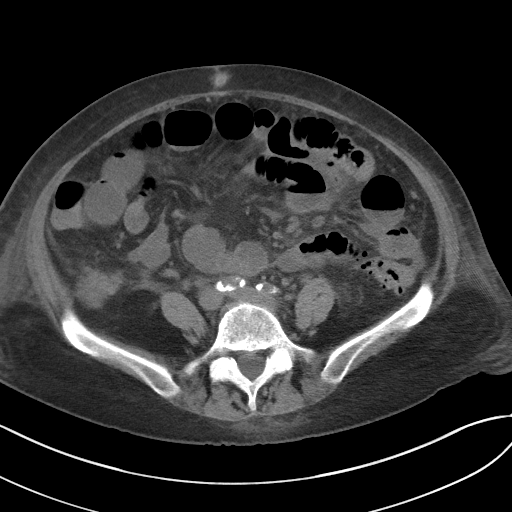
[im 43/85  soft-tissue]
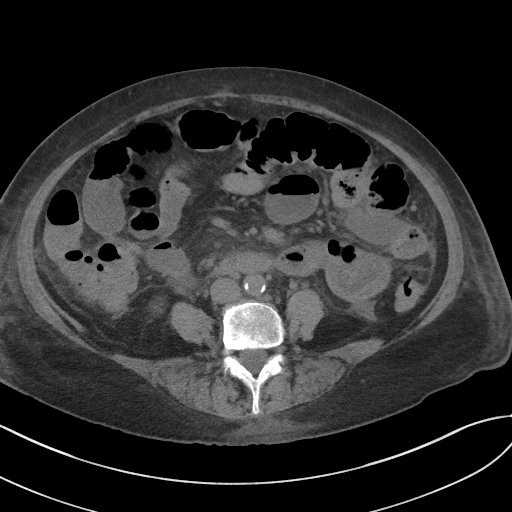
[im 47/85  soft-tissue]
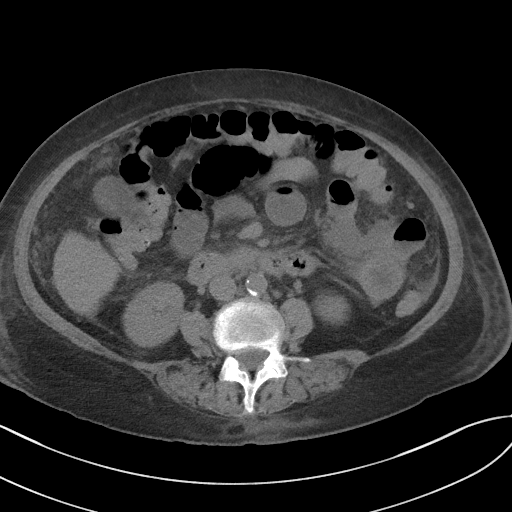
[im 57/85  soft-tissue]
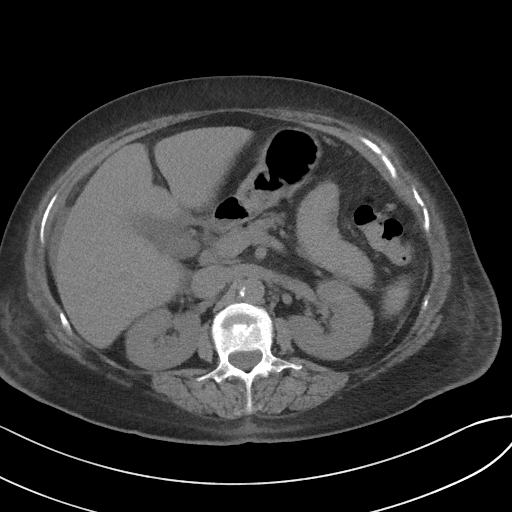
[im 57/85  bone]
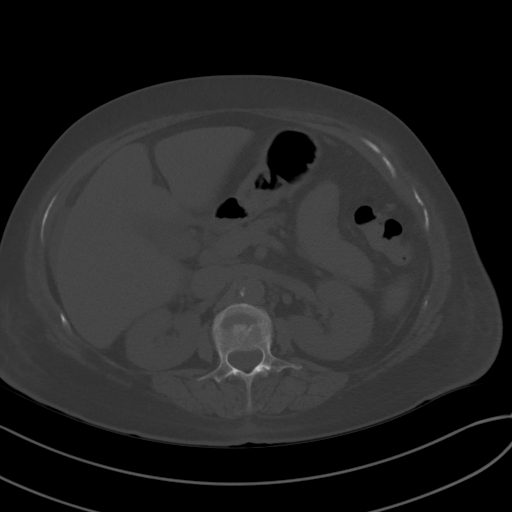
[im 61/85  soft-tissue]
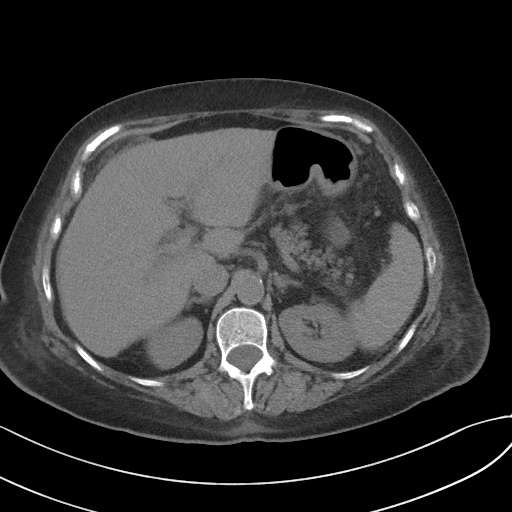
[im 66/85  soft-tissue]
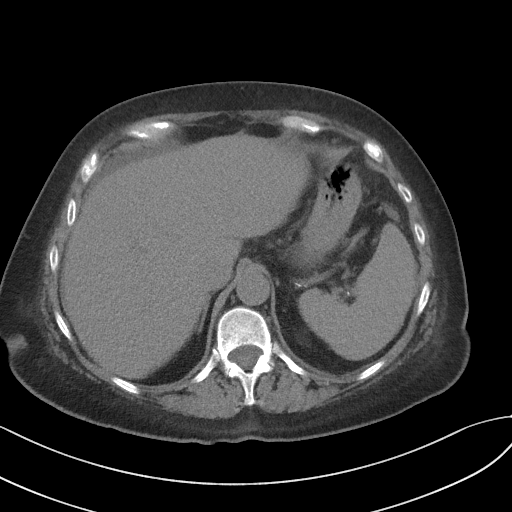
[im 75/85  soft-tissue]
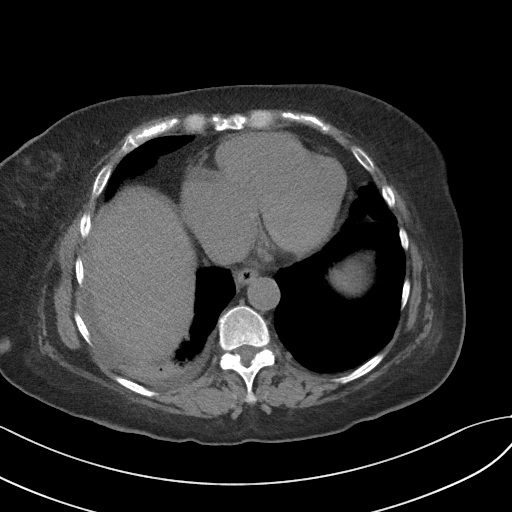
[im 80/85  soft-tissue]
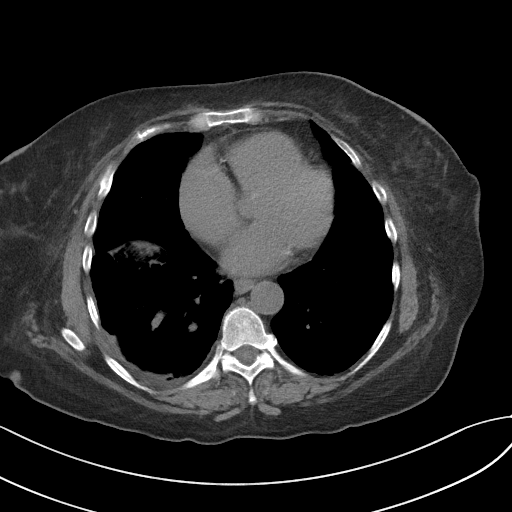

[Series 4: coronal st · coronal · 0.86mm/px · 3 of 101 slices shown]
[im 34/101  soft-tissue]
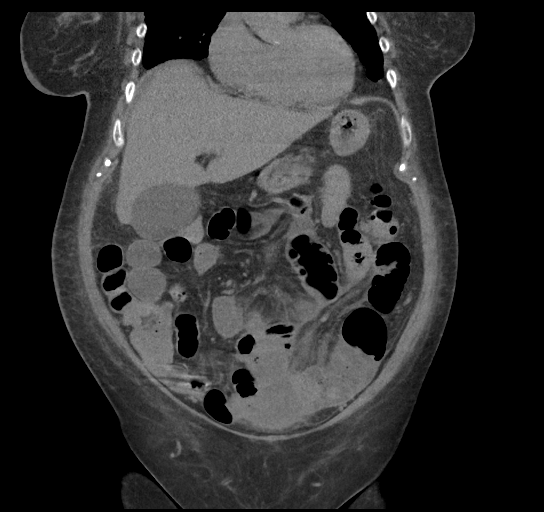
[im 45/101  soft-tissue]
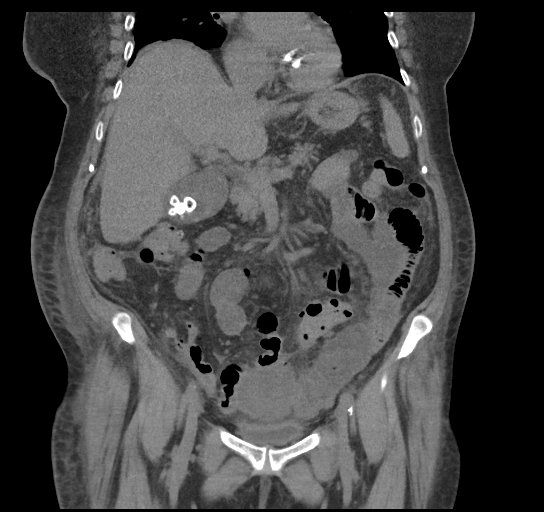
[im 56/101  soft-tissue]
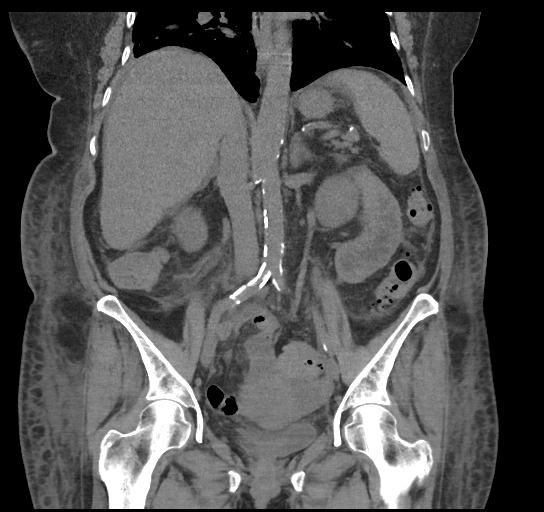

[16 of 46 positions shown; findings below may reference images not displayed]

FINDINGS: Lower chest: Small RIGHT pleural effusion and subsegmental
atelectasis RIGHT lower lobe. Peripheral chronic interstitial lung
disease changes at both lung bases.

Hepatobiliary: Multiple calcified gallstones within a mildly
distended gallbladder. Liver unremarkable.

Pancreas: Normal appearance

Spleen: Normal appearance

Adrenals/Urinary Tract: Adrenal glands, kidneys, ureters, and
bladder normal appearance

Stomach/Bowel: Normal appendix. Stomach normal appearance. Some of
the small bowel loops are dilated favoring ileus. Normal appearance
of colon from cecum through splenic flexure. Diverticulosis of
descending and sigmoid colon with again identified wall thickening
and pericolic inflammatory changes at the sigmoid colon consistent
with acute diverticulitis. Fluid collections in the central pelvis
may represent fluid-filled bowel loops though due to lack of IV and
oral contrast, it is difficult to completely exclude interloop
collections.

Vascular/Lymphatic: Atherosclerotic calcifications of aorta and
iliac arteries. Aorta normal caliber. No adenopathy.

Reproductive: Unremarkable uterus.  Ovaries obscured.

Other: Small amount of free fluid perihepatic, LEFT gutter, and
suspect interloop in the LEFT mid abdomen. No hernia. No free
intraperitoneal air.

Musculoskeletal: Superior endplate compression deformities of T12,
L2, L3, and L4. Degenerative disc disease changes L5-S1.
IMPRESSION: Acute sigmoid diverticulitis without definite evidence of
perforation or abscess collection.

Fluid collections in the central pelvis may represent distended
fluid-filled small bowel loops though it is difficult to completely
exclude interloop collections/abscess; may consider follow-up CT
imaging with IV contrast to better assess, if patient's renal
function permits.

Small amount of nonspecific free intraperitoneal fluid.

Mild dilatation of small bowel loops question ileus with some of the
small bowel loops in the LEFT abdomen showing mild wall thickening,
which could reflect enteritis or secondary peritonitis.

Cholelithiasis.

Small RIGHT pleural effusion and subsegmental atelectasis RIGHT
lower lobe.

Chronic interstitial lung disease changes at both lung bases.

Aortic Atherosclerosis ([04]-[04]).

## 2021-06-08 NOTE — Progress Notes (Addendum)
PROGRESS NOTE    Brittany Hobbs  ZOX:[id number] DOB: [birth year] DOA: 06/03/2021 PCP: Berkley Harvey, NP   Chief Complain: Abdominal pain  Brief Narrative:  Patient is a 60 year old female with history of anxiety, lupus who presented with abdominal pain.  She has recent history of total knee replacement.  Imagings of the abdomen done on presentation showed diverticulitis with pneumoperitoneum suggesting perforation.  General surgery consulted and following.  Currently on conservative management with IV antibiotics.  Overall condition has improved.  But she complained of bloating today.  General surgery ordered for CT abdomen for follow-up examination   Assessment & Plan:   Active Problems:   Acute diverticulitis   Acute diverticulitis with perforation: Started on Zosyn, continue the same for now.  General surgery following.   Her abdominal pain has improved and she was having bowel movements, passing gas.  Diet advanced to soft .But she complained of bloating today.  General surgery ordered for CT abdomen for follow-up examination.Kept NPO  Leukocytosis: Mild elevation.  Patient is afebrile. Continue to monitor  Right lower extremity edema: Recent history of TKA.  Has stitches.Follow-up with orthopedics recommended as an outpatient.  Doppler study did not show DVT.  Anxiety: On anxiety medications as needed  Lupus: Resumed home medication  prednisone 10 mg daily          DVT prophylaxis:Lovenox Code Status: Full Family Communication: None at bedside Status is: Inpatient  Remains inpatient appropriate because:Inpatient level of care appropriate due to severity of illness  Dispo: The patient is from: Home              Anticipated d/c is to: Home              Patient currently is not medically stable to d/c.   Difficult to place patient No      Consultants: General surgery  Procedures:None  Antimicrobials:  Anti-infectives (From admission, onward)    Start      Dose/Rate Route Frequency Ordered Stop   06/03/21 2000  piperacillin-tazobactam (ZOSYN) IVPB 3.375 g        3.375 g 12.5 mL/hr over 240 Minutes Intravenous Every 8 hours 06/03/21 1528     06/03/21 1330  piperacillin-tazobactam (ZOSYN) IVPB 3.375 g        3.375 g 100 mL/hr over 30 Minutes Intravenous  Once 06/03/21 1315 06/03/21 1527       Subjective:  Patient seen and examined the bedside this morning.  Hemodynamically stable.  Complains of some bloating, uncomfortable feeling today.  No nausea or vomiting.  No worsening of abdominal pain.  Objective: Vitals:   06/07/21 0504 06/07/21 1308 06/07/21 2121 06/08/21 0501  BP: 126/73 117/66 137/82 122/70  Pulse: 76 (!) 101 93 (!) 102  Resp: 16 18 16 16   Temp: 98.4 F (36.9 C) 98.4 F (36.9 C) (!) 97.5 F (36.4 C) 100 F (37.8 C)  TempSrc: Oral  Oral Oral  SpO2: 99% 96% (!) 84% (!) 84%  Weight:      Height:        Intake/Output Summary (Last 24 hours) at 06/08/2021 1041 Last data filed at 06/08/2021 1000 Gross per 24 hour  Intake 1688.04 ml  Output 750 ml  Net 938.04 ml   Filed Weights   06/03/21 1525  Weight: 64 kg    Examination:  General exam: Overall comfortable, not in distress HEENT: PERRL Respiratory system:  no wheezes or crackles  Cardiovascular system: S1 & S2 heard, RRR.  Gastrointestinal  system: Abdomen is mildly distended, soft, nontender, bowel sounds present Central nervous system: Alert and oriented Extremities: No edema, no clubbing ,no cyanosis, stitches on the right knee Skin: No rashes, no ulcers,no icterus       Data Reviewed: I have personally reviewed following labs and imaging studies  CBC: Recent Labs  Lab 06/03/21 0921 06/04/21 0435 06/05/21 0422 06/06/21 0339 06/07/21 0419 06/08/21 0400  WBC 10.1 14.7* 16.3* 13.6* 11.6* 15.5*  NEUTROABS 8.9*  --   --  11.5*  --   --   HGB 13.7 13.1 12.0 12.3 11.9* 11.8*  HCT 42.3 41.4 37.0 38.4 37.7 37.3  MCV 88.9 89.4 88.9 88.9 89.1 88.8   PLT 357 327 336 334 305 485   Basic Metabolic Panel: Recent Labs  Lab 06/03/21 0921 06/04/21 0435 06/06/21 0339  NA 143 143 140  K 4.1 4.8 3.8  CL 109 104 103  CO2 25 28 27   GLUCOSE 107* 87 97  BUN 28* 23* 19  CREATININE 0.62 0.66 0.64  CALCIUM 9.6 9.1 8.7*   GFR: Estimated Creatinine Clearance: 68.1 mL/min (by C-G formula based on SCr of 0.64 mg/dL). Liver Function Tests: Recent Labs  Lab 06/03/21 0921 06/04/21 0435  AST 24 16  ALT 14 11  ALKPHOS 120 97  BILITOT 1.4* 1.0  PROT 7.2 6.6  ALBUMIN 3.8 3.1*   Recent Labs  Lab 06/03/21 0921  LIPASE 22   No results for input(s): AMMONIA in the last 168 hours. Coagulation Profile: No results for input(s): INR, PROTIME in the last 168 hours. Cardiac Enzymes: No results for input(s): CKTOTAL, CKMB, CKMBINDEX, TROPONINI in the last 168 hours. BNP (last 3 results) No results for input(s): PROBNP in the last 8760 hours. HbA1C: No results for input(s): HGBA1C in the last 72 hours. CBG: No results for input(s): GLUCAP in the last 168 hours. Lipid Profile: No results for input(s): CHOL, HDL, LDLCALC, TRIG, CHOLHDL, LDLDIRECT in the last 72 hours. Thyroid Function Tests: No results for input(s): TSH, T4TOTAL, FREET4, T3FREE, THYROIDAB in the last 72 hours. Anemia Panel: No results for input(s): VITAMINB12, FOLATE, FERRITIN, TIBC, IRON, RETICCTPCT in the last 72 hours. Sepsis Labs: No results for input(s): PROCALCITON, LATICACIDVEN in the last 168 hours.  Recent Results (from the past 240 hour(s))  Resp Panel by RT-PCR (Flu A&B, Covid) Nasopharyngeal Swab     Status: None   Collection Time: 06/03/21  9:51 AM   Specimen: Nasopharyngeal Swab; Nasopharyngeal(NP) swabs in vial transport medium  Result Value Ref Range Status   SARS Coronavirus 2 by RT PCR NEGATIVE NEGATIVE Final    Comment: (NOTE) SARS-CoV-2 target nucleic acids are NOT DETECTED.  The SARS-CoV-2 RNA is generally detectable in upper respiratory specimens  during the acute phase of infection. The lowest concentration of SARS-CoV-2 viral copies this assay can detect is 138 copies/mL. A negative result does not preclude SARS-Cov-2 infection and should not be used as the sole basis for treatment or other patient management decisions. A negative result may occur with  improper specimen collection/handling, submission of specimen other than nasopharyngeal swab, presence of viral mutation(s) within the areas targeted by this assay, and inadequate number of viral copies(<138 copies/mL). A negative result must be combined with clinical observations, patient history, and epidemiological information. The expected result is Negative.  Fact Sheet for Patients:  EntrepreneurPulse.com.au  Fact Sheet for Healthcare Providers:  IncredibleEmployment.be  This test is no t yet approved or cleared by the [place] FDA and  has been  authorized for detection and/or diagnosis of SARS-CoV-2 by FDA under an Emergency Use Authorization (EUA). This EUA will remain  in effect (meaning this test can be used) for the duration of the COVID-19 declaration under Section 564(b)(1) of the Act, 21 U.S.C.section 360bbb-3(b)(1), unless the authorization is terminated  or revoked sooner.       Influenza A by PCR NEGATIVE NEGATIVE Final   Influenza B by PCR NEGATIVE NEGATIVE Final    Comment: (NOTE) The Xpert Xpress SARS-CoV-2/FLU/RSV plus assay is intended as an aid in the diagnosis of influenza from Nasopharyngeal swab specimens and should not be used as a sole basis for treatment. Nasal washings and aspirates are unacceptable for Xpert Xpress SARS-CoV-2/FLU/RSV testing.  Fact Sheet for Patients: EntrepreneurPulse.com.au  Fact Sheet for Healthcare Providers: IncredibleEmployment.be  This test is not yet approved or cleared by the [place] FDA and has been authorized for detection  and/or diagnosis of SARS-CoV-2 by FDA under an Emergency Use Authorization (EUA). This EUA will remain in effect (meaning this test can be used) for the duration of the COVID-19 declaration under Section 564(b)(1) of the Act, 21 U.S.C. section 360bbb-3(b)(1), unless the authorization is terminated or revoked.  Performed at [facility], [place] [address], [place], [place] [address]          Radiology Studies: No results found.      Scheduled Meds:  acetaminophen  1,000 mg Oral Q6H   enoxaparin (LOVENOX) injection  40 mg Subcutaneous Q24H   escitalopram  20 mg Oral Daily   gabapentin  800 mg Oral TID   LORazepam  0.5 mg Oral q morning   LORazepam  1 mg Oral QPM   metoprolol tartrate  12.5 mg Oral Daily   predniSONE  10 mg Oral Q breakfast   Continuous Infusions:  lactated ringers 50 mL/hr at 06/07/21 1704   piperacillin-tazobactam (ZOSYN)  IV 3.375 g (06/08/21 0408)     LOS: 5 days    Time spent: 25 mins.More than 50% of that time was spent in counseling and/or coordination of care.      Shelly Coss, MD [facility] P9/21/2022, 10:41 AM

## 2021-06-08 NOTE — Progress Notes (Signed)
   Progress Note     Subjective: Patient reports some pain this AM but not worsening. She denies nausea and actually just ordered breakfast. She does report increased bloating and less bowel function overnight.   Objective: Vital signs in last 24 hours: Temp:  [97.5 F (36.4 C)-100 F (37.8 C)] 100 F (37.8 C) (09/21 0501) Pulse Rate:  [93-102] 102 (09/21 0501) Resp:  [16-18] 16 (09/21 0501) BP: (117-137)/(66-82) 122/70 (09/21 0501) SpO2:  [84 %-96 %] 84 % (09/21 0501) Last BM Date: 06/08/21  Intake/Output from previous day: 09/20 0701 - 09/21 0700 In: 2161.7 [P.O.:840; I.V.:1175.3; IV Piggyback:146.4] Out: 750 [Urine:750] Intake/Output this shift: No intake/output data recorded.  PE: General: pleasant, WD, overweight female who is laying in bed in NAD Heart: regular, rate, and rhythm.   Lungs: CTAB, no wheezes, rhonchi, or rales noted.  Respiratory effort nonlabored Abd: soft, mild ttp in LLQ with no voluntary guarding, no peritonitis, more distention, BS hypoactive Psych: A&Ox3 with an appropriate affect.    Lab Results:  Recent Labs    06/07/21 0419 06/08/21 0400  WBC 11.6* 15.5*  HGB 11.9* 11.8*  HCT 37.7 37.3  PLT 305 325   BMET Recent Labs    06/06/21 0339  NA 140  K 3.8  CL 103  CO2 27  GLUCOSE 97  BUN 19  CREATININE 0.64  CALCIUM 8.7*   PT/INR No results for input(s): LABPROT, INR in the last 72 hours. CMP     Component Value Date/Time   NA 140 06/06/2021 0339   K 3.8 06/06/2021 0339   CL 103 06/06/2021 0339   CO2 27 06/06/2021 0339   GLUCOSE 97 06/06/2021 0339   BUN 19 06/06/2021 0339   CREATININE 0.64 06/06/2021 0339   CALCIUM 8.7 (L) 06/06/2021 0339   PROT 6.6 06/04/2021 0435   ALBUMIN 3.1 (L) 06/04/2021 0435   AST 16 06/04/2021 0435   ALT 11 06/04/2021 0435   ALKPHOS 97 06/04/2021 0435   BILITOT 1.0 06/04/2021 0435   GFRNONAA >60 06/06/2021 0339   GFRAA >60 10/02/2019 0445   Lipase     Component Value Date/Time   LIPASE 22  06/03/2021 0921       Studies/Results: No results found.  Anti-infectives: Anti-infectives (From admission, onward)    Start     Dose/Rate Route Frequency Ordered Stop   06/03/21 2000  piperacillin-tazobactam (ZOSYN) IVPB 3.375 g        3.375 g 12.5 mL/hr over 240 Minutes Intravenous Every 8 hours 06/03/21 1528     06/03/21 1330  piperacillin-tazobactam (ZOSYN) IVPB 3.375 g        3.375 g 100 mL/hr over 30 Minutes Intravenous  Once 06/03/21 1315 06/03/21 1527        Assessment/Plan Diverticulitis with contained perforation - WBC back up to 15 and low grade temp of 100  - patient without nausea and vomiting on low fiber diet but feels more bloated and less bowel function  - no indication for emergent surgical intervention at this time - CT AP today - non-contrasted scan with iodine allergy of anaphylaxis    FEN: NPO/ IVF per [facility] VTE: LMWH ID: [email or web address] 9/16>>   Arthritis Lupus HLD Panic disorder   LOS: 5 days    Norm Parcel, [facility] Surgery 06/08/2021, 9:56 AM Please see Amion for pager number during day hours 7:00am-4:30pm

## 2021-06-09 DIAGNOSIS — K5792 Diverticulitis of intestine, part unspecified, without perforation or abscess without bleeding: Secondary | ICD-10-CM | POA: Diagnosis not present

## 2021-06-09 LAB — CBC WITH DIFFERENTIAL/PLATELET
Abs Immature Granulocytes: 0.13 10*3/uL — ABNORMAL HIGH (ref 0.00–0.07)
Basophils Absolute: 0 10*3/uL (ref 0.0–0.1)
Basophils Relative: 0 %
Eosinophils Absolute: 0 10*3/uL (ref 0.0–0.5)
Eosinophils Relative: 0 %
HCT: 36.9 % (ref 36.0–46.0)
Hemoglobin: 12 g/dL (ref 12.0–15.0)
Immature Granulocytes: 1 %
Lymphocytes Relative: 9 %
Lymphs Abs: 1.3 10*3/uL (ref 0.7–4.0)
MCH: 27.8 pg (ref 26.0–34.0)
MCHC: 32.5 g/dL (ref 30.0–36.0)
MCV: 85.6 fL (ref 80.0–100.0)
Monocytes Absolute: 0.8 10*3/uL (ref 0.1–1.0)
Monocytes Relative: 6 %
Neutro Abs: 12.1 10*3/uL — ABNORMAL HIGH (ref 1.7–7.7)
Neutrophils Relative %: 84 %
Platelets: 356 10*3/uL (ref 150–400)
RBC: 4.31 MIL/uL (ref 3.87–5.11)
RDW: 16.1 % — ABNORMAL HIGH (ref 11.5–15.5)
WBC: 14.5 10*3/uL — ABNORMAL HIGH (ref 4.0–10.5)
nRBC: 0 % (ref 0.0–0.2)

## 2021-06-09 LAB — BASIC METABOLIC PANEL
Anion gap: 9 (ref 5–15)
BUN: 12 mg/dL (ref 6–20)
CO2: 30 mmol/L (ref 22–32)
Calcium: 8.3 mg/dL — ABNORMAL LOW (ref 8.9–10.3)
Chloride: 99 mmol/L (ref 98–111)
Creatinine, Ser: 0.52 mg/dL (ref 0.44–1.00)
GFR, Estimated: >60 mL/min (ref >60)
Glucose, Bld: 96 mg/dL (ref 70–99)
Potassium: 3.4 mmol/L — ABNORMAL LOW (ref 3.5–5.1)
Sodium: 138 mmol/L (ref 135–145)

## 2021-06-09 LAB — SURGICAL PCR SCREEN
MRSA, PCR: NEGATIVE
Staphylococcus aureus: NEGATIVE

## 2021-06-09 MED ORDER — POTASSIUM CHLORIDE 10 MEQ/100ML IV SOLN
10.0000 meq | INTRAVENOUS | Status: AC
  Administered 2021-06-09 (×4): 10 meq via INTRAVENOUS
  Filled 2021-06-09 (×4): qty 100

## 2021-06-09 MED ORDER — CHLORHEXIDINE GLUCONATE CLOTH 2 % EX PADS
6.0000 | MEDICATED_PAD | Freq: Once | CUTANEOUS | Status: AC
  Administered 2021-06-09: 6 via TOPICAL

## 2021-06-09 NOTE — Progress Notes (Signed)
Paged Dr. Michaelle Birks to contact pt's sister.

## 2021-06-09 NOTE — Progress Notes (Signed)
Progress Note     Subjective: Patient reports that her pain is still very severe this morning.  Her white blood cell count remains elevated at 14.  She appears very uncomfortable this morning.  CT scan yesterday is very limited by the lack of IV contrast due to her anaphylactic allergy, and is unclear if she has a discrete abscess.  However there is significant ongoing inflammation and mild small bowel dilation.  Objective: Vital signs in last 24 hours: Temp:  [98.1 F (36.7 C)-99.2 F (37.3 C)] 99.2 F (37.3 C) (09/22 0529) Pulse Rate:  [76-105] 105 (09/22 0529) Resp:  [18] 18 (09/22 0529) BP: (124-162)/(73-86) 160/84 (09/22 0529) SpO2:  [91 %-93 %] 93 % (09/22 0529) Last BM Date: 06/08/21  Intake/Output from previous day: 09/21 0701 - 09/22 0700 In: 1867.9 [P.O.:480; I.V.:1251.4; IV Piggyback:136.6] Out: 1100 [Urine:1100] Intake/Output this shift: No intake/output data recorded.  PE: General: pleasant, WD, overweight female who is laying in bed in NAD Heart: regular, rate, and rhythm.   Lungs: CTAB, no wheezes, rhonchi, or rales noted.  Respiratory effort nonlabored Abd: soft, mild ttp in LLQ with no voluntary guarding, no peritonitis, more distention, BS hypoactive Psych: A&Ox3 with an appropriate affect.    Lab Results:  Recent Labs    06/08/21 0400 06/09/21 0423  WBC 15.5* 14.5*  HGB 11.8* 12.0  HCT 37.3 36.9  PLT 325 356   BMET Recent Labs    06/09/21 0423  NA 138  K 3.4*  CL 99  CO2 30  GLUCOSE 96  BUN 12  CREATININE 0.52  CALCIUM 8.3*   PT/INR No results for input(s): LABPROT, INR in the last 72 hours. CMP     Component Value Date/Time   NA 138 06/09/2021 0423   K 3.4 (L) 06/09/2021 0423   CL 99 06/09/2021 0423   CO2 30 06/09/2021 0423   GLUCOSE 96 06/09/2021 0423   BUN 12 06/09/2021 0423   CREATININE 0.52 06/09/2021 0423   CALCIUM 8.3 (L) 06/09/2021 0423   PROT 6.6 06/04/2021 0435   ALBUMIN 3.1 (L) 06/04/2021 0435   AST 16 06/04/2021  0435   ALT 11 06/04/2021 0435   ALKPHOS 97 06/04/2021 0435   BILITOT 1.0 06/04/2021 0435   GFRNONAA >60 06/09/2021 0423   GFRAA >60 10/02/2019 0445   Lipase     Component Value Date/Time   LIPASE 22 06/03/2021 0921       Studies/Results: CT ABDOMEN PELVIS WO CONTRAST  Result Date: 06/08/2021 CLINICAL DATA:  Lower abdominal pain, history of diverticulitis, question complication EXAM: CT ABDOMEN AND PELVIS WITHOUT CONTRAST TECHNIQUE: Multidetector CT imaging of the abdomen and pelvis was performed following the standard protocol without IV contrast. Sagittal and coronal MPR images reconstructed from axial data set. No oral contrast administered. COMPARISON:  06/03/2021 FINDINGS: Lower chest: Small RIGHT pleural effusion and subsegmental atelectasis RIGHT lower lobe. Peripheral chronic interstitial lung disease changes at both lung bases. Hepatobiliary: Multiple calcified gallstones within a mildly distended gallbladder. Liver unremarkable. Pancreas: Normal appearance Spleen: Normal appearance Adrenals/Urinary Tract: Adrenal glands, kidneys, ureters, and bladder normal appearance Stomach/Bowel: Normal appendix. Stomach normal appearance. Some of the small bowel loops are dilated favoring ileus. Normal appearance of colon from cecum through splenic flexure. Diverticulosis of descending and sigmoid colon with again identified wall thickening and pericolic inflammatory changes at the sigmoid colon consistent with acute diverticulitis. Fluid collections in the central pelvis may represent fluid-filled bowel loops though due to lack of IV and oral contrast,  it is difficult to completely exclude interloop collections. Vascular/Lymphatic: Atherosclerotic calcifications of aorta and iliac arteries. Aorta normal caliber. No adenopathy. Reproductive: Unremarkable uterus.  Ovaries obscured. Other: Small amount of free fluid perihepatic, LEFT gutter, and suspect interloop in the LEFT mid abdomen. No hernia. No  free intraperitoneal air. Musculoskeletal: Superior endplate compression deformities of T12, L2, L3, and L4. Degenerative disc disease changes L5-S1. IMPRESSION: Acute sigmoid diverticulitis without definite evidence of perforation or abscess collection. Fluid collections in the central pelvis may represent distended fluid-filled small bowel loops though it is difficult to completely exclude interloop collections/abscess; may consider follow-up CT imaging with IV contrast to better assess, if patient's renal function permits. Small amount of nonspecific free intraperitoneal fluid. Mild dilatation of small bowel loops question ileus with some of the small bowel loops in the LEFT abdomen showing mild wall thickening, which could reflect enteritis or secondary peritonitis. Cholelithiasis. Small RIGHT pleural effusion and subsegmental atelectasis RIGHT lower lobe. Chronic interstitial lung disease changes at both lung bases. Aortic Atherosclerosis (ICD10-I70.0). Electronically Signed   By: Lavonia Dana M.D.   On: 06/08/2021 16:27    Anti-infectives: Anti-infectives (From admission, onward)    Start     Dose/Rate Route Frequency Ordered Stop   06/03/21 2000  piperacillin-tazobactam (ZOSYN) IVPB 3.375 g        3.375 g 12.5 mL/hr over 240 Minutes Intravenous Every 8 hours 06/03/21 1528     06/03/21 1330  piperacillin-tazobactam (ZOSYN) IVPB 3.375 g        3.375 g 100 mL/hr over 30 Minutes Intravenous  Once 06/03/21 1315 06/03/21 1527        Assessment/Plan Diverticulitis with contained perforation Patient has failed to make significant progress clinically and has not been on IV antibiotics for nearly a week.  Her leukocytosis has not resolved and she continues to have severe left lower quadrant pain and tenderness.  There is not a clear abscess on her most recent imaging.  I discussed with her this morning that since this is her third episode of diverticulitis within the last 2 years, and she has failed  to improve on IV antibiotics, I think the best course for her at this point is to proceed with surgery.  I discussed the details of a sigmoid colectomy and end colostomy with her.  She agrees to proceed with surgery.  Wound ostomy consult placed for stoma marking today.  We will proceed with surgery either later today or tomorrow morning.  Please keep NPO.   FEN: NPO/ IVF  VTE: LMWH ID: Zosyn 9/16>>   Arthritis Lupus HLD Panic disorder   LOS: 6 days    Dwan Bolt, MD [facility] Surgery 06/09/2021, 8:55 AM Please see Amion for pager number during day hours 7:00am-4:30pm

## 2021-06-09 NOTE — Consult Note (Signed)
Urology Consult   Physician requesting consult: Dr Zenia Resides  Reason for consult: Need for preoperative placement of  Left ureteral stent aid  with ureteral identification for sigmoid diverticulitis surgery  History of Present Illness: Brittany Hobbs is a 60 y.o. white female admitted on 06/03/2021 with diverticulitis.  She is felt to have possible abscess and continues to have significant abdominal pain.  Plan for surgical exploration tomorrow with general surgery.  They have asked urology to place preoperative ureteral stents for intraoperative ureteral identification.  She denies a history of voiding or storage urinary symptoms, hematuria, UTIs, STDs, urolithiasis, GU malignancy/trauma/surgery.  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   Current Outpatient Medications on File Prior to Encounter  Medication Sig Dispense Refill   albuterol (VENTOLIN HFA) 108 (90 Base) MCG/ACT inhaler Inhale 2 puffs into the lungs every 6 (six) hours as needed for wheezing or shortness of breath.     aspirin 81 MG chewable tablet Chew 1 tablet (81 mg total) by mouth 2 (two) times daily. 30 tablet 0   carboxymethylcellulose (REFRESH PLUS) 0.5 % SOLN Place 1 drop into both eyes 3 (three) times daily as needed (dry eyes).      EC-NAPROXEN 500 MG EC tablet Take 500 mg by mouth 2 (two) times daily.     escitalopram (LEXAPRO) 20 MG tablet Take 20 mg by mouth daily.     gabapentin (NEURONTIN) 400 MG capsule Take 800 mg by mouth 3 (three) times daily.     HYDROcodone-acetaminophen (NORCO/VICODIN) 5-325 MG tablet Take 1-2 tablets by mouth every 6 (six) hours as needed for moderate pain. 30 tablet 0   LORazepam (ATIVAN) 0.5 MG tablet Take 0.5-1 mg by mouth See admin instructions. 0.5mg  in AM and 1mg  in evening     metoprolol tartrate (LOPRESSOR) 25 MG tablet Take 12.5 mg by mouth daily.     nystatin (MYCOSTATIN) 100000 UNIT/ML suspension Take 5 mLs by mouth 4 (four) times daily as needed (dry mouth).     ondansetron (ZOFRAN ODT) 4 MG disintegrating tablet Take 1 tablet (4 mg total) by mouth every 8 (eight) hours as needed for nausea or vomiting. 20 tablet 0   predniSONE (DELTASONE) 10 MG tablet Take 10 mg by mouth daily with breakfast.     tiZANidine (ZANAFLEX) 4 MG tablet Take 1 tablet (4 mg total) by mouth every 8 (eight) hours as needed for muscle spasms. 30 tablet 0   linaclotide (LINZESS) 145 MCG CAPS capsule Take 1 capsule (145 mcg total) by mouth daily before breakfast. (Patient not taking: No sig reported) 10 capsule 1   oxyCODONE (OXY IR/ROXICODONE) 5 MG immediate release tablet Take 1-2 tablets (5-10 mg total)  by mouth every 4 (four) hours as needed for moderate pain (pain score 4-6). (Patient not taking: Reported on 06/03/2021) 30 tablet 0     Scheduled Meds:  acetaminophen  1,000 mg Oral Q6H   enoxaparin (LOVENOX) injection  40 mg Subcutaneous Q24H   escitalopram  20 mg Oral Daily   gabapentin  800 mg Oral TID   LORazepam  0.5 mg Oral q morning   LORazepam  1 mg Oral QPM   metoprolol tartrate  12.5 mg Oral Daily   predniSONE  10 mg Oral Q breakfast   Continuous Infusions:  lactated ringers 75 mL/hr at 06/09/21 1213   piperacillin-tazobactam (ZOSYN)  IV 3.375 g (06/09/21 1212)   potassium chloride 10 mEq  (06/09/21 1428)   PRN Meds:.HYDROmorphone (DILAUDID) injection, morphine injection, ondansetron **OR** ondansetron (ZOFRAN) IV  Allergies:  Allergies  Allergen Reactions   Iodine Anaphylaxis    Pt states she is allergic to INTERNAL IODINE   Betadine [Povidone Iodine] Hives   Methocarbamol Nausea Only    Felt nauseated and sick   Sulfa Antibiotics     N/V    Family History  Problem Relation Age of Onset   Colon cancer Neg Hx    Stomach cancer Neg Hx    Esophageal cancer Neg Hx    Rectal cancer Neg Hx     Social History:  reports that she has been smoking cigarettes. She has a 9.00 pack-year smoking history. She has never used smokeless tobacco. She reports current drug use. Frequency: 7.00 times per week. Drug: Marijuana. She reports that she does not drink alcohol.  ROS: A complete review of systems was performed.  All systems are negative except for pertinent findings as noted.  Physical Exam:  Vital signs in last 24 hours: Temp:  [98.2 F (36.8 C)-99.2 F (37.3 C)] 98.2 F (36.8 C) (09/22 1426) Pulse Rate:  [76-105] 79 (09/22 1426) Resp:  [17-18] 17 (09/22 1426) BP: (152-162)/(79-86) 152/79 (09/22 1426) SpO2:  [92 %-93 %] 92 % (09/22 1426) Constitutional:  Alert and oriented, No acute distress  Laboratory Data:  Recent Labs    06/07/21 0419 06/08/21 0400 06/09/21 0423  WBC 11.6* 15.5* 14.5*  HGB 11.9* 11.8* 12.0  HCT 37.7 37.3 36.9  PLT 305 325 356    Recent Labs    06/09/21 0423  NA 138  K 3.4*  CL 99  GLUCOSE 96  BUN 12  CALCIUM 8.3*  CREATININE 0.52     Results for orders placed or performed during the hospital encounter of 06/03/21 (from the past 24 hour(s))  CBC with Differential/Platelet     Status: Abnormal   Collection Time: 06/09/21  4:23 AM  Result Value Ref Range   WBC 14.5 (H) 4.0 - 10.5 K/uL   RBC 4.31 3.87 - 5.11 MIL/uL   Hemoglobin 12.0 12.0 - 15.0 g/dL   HCT 36.9 36.0 - 46.0 %   MCV 85.6 80.0 - 100.0 fL   MCH 27.8 26.0 -  34.0 pg   MCHC 32.5 30.0 - 36.0 g/dL   RDW 16.1 (H) 11.5 - 15.5 %   Platelets 356 150 - 400 K/uL   nRBC 0.0 0.0 - 0.2 %   Neutrophils Relative % 84 %   Neutro Abs 12.1 (H) 1.7 - 7.7 K/uL   Lymphocytes Relative 9 %   Lymphs Abs 1.3 0.7 - 4.0 K/uL   Monocytes Relative 6 %   Monocytes Absolute 0.8 0.1 - 1.0 K/uL   Eosinophils Relative 0 %  Eosinophils Absolute 0.0 0.0 - 0.5 K/uL   Basophils Relative 0 %   Basophils Absolute 0.0 0.0 - 0.1 K/uL   Immature Granulocytes 1 %   Abs Immature Granulocytes 0.13 (H) 0.00 - 0.07 K/uL  Basic metabolic panel     Status: Abnormal   Collection Time: 06/09/21  4:23 AM  Result Value Ref Range   Sodium 138 135 - 145 mmol/L   Potassium 3.4 (L) 3.5 - 5.1 mmol/L   Chloride 99 98 - 111 mmol/L   CO2 30 22 - 32 mmol/L   Glucose, Bld 96 70 - 99 mg/dL   BUN 12 6 - 20 mg/dL   Creatinine, Ser 0.52 0.44 - 1.00 mg/dL   Calcium 8.3 (L) 8.9 - 10.3 mg/dL   GFR, Estimated >60 >60 mL/min   Anion gap 9 5 - 15   Recent Results (from the past 240 hour(s))  Resp Panel by RT-PCR (Flu A&B, Covid) Nasopharyngeal Swab     Status: None   Collection Time: 06/03/21  9:51 AM   Specimen: Nasopharyngeal Swab; Nasopharyngeal(NP) swabs in vial transport medium  Result Value Ref Range Status   SARS Coronavirus 2 by RT PCR NEGATIVE NEGATIVE Final    Comment: (NOTE) SARS-CoV-2 target nucleic acids are NOT DETECTED.  The SARS-CoV-2 RNA is generally detectable in upper respiratory specimens during the acute phase of infection. The lowest concentration of SARS-CoV-2 viral copies this assay can detect is 138 copies/mL. A negative result does not preclude SARS-Cov-2 infection and should not be used as the sole basis for treatment or other patient management decisions. A negative result may occur with  improper specimen collection/handling, submission of specimen other than nasopharyngeal swab, presence of viral mutation(s) within the areas targeted by this assay, and inadequate  number of viral copies(<138 copies/mL). A negative result must be combined with clinical observations, patient history, and epidemiological information. The expected result is Negative.  Fact Sheet for Patients:  EntrepreneurPulse.com.au  Fact Sheet for Healthcare Providers:  IncredibleEmployment.be  This test is no t yet approved or cleared by the [place] FDA and  has been authorized for detection and/or diagnosis of SARS-CoV-2 by FDA under an Emergency Use Authorization (EUA). This EUA will remain  in effect (meaning this test can be used) for the duration of the COVID-19 declaration under Section 564(b)(1) of the Act, 21 U.S.C.section 360bbb-3(b)(1), unless the authorization is terminated  or revoked sooner.       Influenza A by PCR NEGATIVE NEGATIVE Final   Influenza B by PCR NEGATIVE NEGATIVE Final    Comment: (NOTE) The Xpert Xpress SARS-CoV-2/FLU/RSV plus assay is intended as an aid in the diagnosis of influenza from Nasopharyngeal swab specimens and should not be used as a sole basis for treatment. Nasal washings and aspirates are unacceptable for Xpert Xpress SARS-CoV-2/FLU/RSV testing.  Fact Sheet for Patients: EntrepreneurPulse.com.au  Fact Sheet for Healthcare Providers: IncredibleEmployment.be  This test is not yet approved or cleared by the [place] FDA and has been authorized for detection and/or diagnosis of SARS-CoV-2 by FDA under an Emergency Use Authorization (EUA). This EUA will remain in effect (meaning this test can be used) for the duration of the COVID-19 declaration under Section 564(b)(1) of the Act, 21 U.S.C. section 360bbb-3(b)(1), unless the authorization is terminated or revoked.  Performed at [facility], [place] [address] [place], [place] [address]     Renal Function: Recent Labs    06/03/21 [phone] 06/04/21 0435 06/06/21 [phone]  06/09/21 0423  CREATININE 0.62 0.66 0.64 0.52   Estimated Creatinine Clearance: 68.1 mL/min (by C-G formula based on SCr of 0.52 mg/dL).  Radiologic Imaging: CT ABDOMEN PELVIS WO CONTRAST  Result Date: 06/08/2021 CLINICAL DATA:  Lower abdominal pain, history of diverticulitis, question complication EXAM: CT ABDOMEN AND PELVIS WITHOUT CONTRAST TECHNIQUE: Multidetector CT imaging of the abdomen and pelvis was performed following the standard protocol without IV contrast. Sagittal and coronal MPR images reconstructed from axial data set. No oral contrast administered. COMPARISON:  06/03/2021 FINDINGS: Lower chest: Small RIGHT pleural effusion and subsegmental atelectasis RIGHT lower lobe. Peripheral chronic interstitial lung disease changes at both lung bases. Hepatobiliary: Multiple calcified gallstones within a mildly distended gallbladder. Liver unremarkable. Pancreas: Normal appearance Spleen: Normal appearance Adrenals/Urinary Tract: Adrenal glands, kidneys, ureters, and bladder normal appearance Stomach/Bowel: Normal appendix. Stomach normal appearance. Some of the small bowel loops are dilated favoring ileus. Normal appearance of colon from cecum through splenic flexure. Diverticulosis of descending and sigmoid colon with again identified wall thickening and pericolic inflammatory changes at the sigmoid colon consistent with acute diverticulitis. Fluid collections in the central pelvis may represent fluid-filled bowel loops though due to lack of IV and oral contrast, it is difficult to completely exclude interloop collections. Vascular/Lymphatic: Atherosclerotic calcifications of aorta and iliac arteries. Aorta normal caliber. No adenopathy. Reproductive: Unremarkable uterus.  Ovaries obscured. Other: Small amount of free fluid perihepatic, LEFT gutter, and suspect interloop in the LEFT mid abdomen. No hernia. No free intraperitoneal air. Musculoskeletal: Superior endplate compression deformities of  T12, L2, L3, and L4. Degenerative disc disease changes L5-S1. IMPRESSION: Acute sigmoid diverticulitis without definite evidence of perforation or abscess collection. Fluid collections in the central pelvis may represent distended fluid-filled small bowel loops though it is difficult to completely exclude interloop collections/abscess; may consider follow-up CT imaging with IV contrast to better assess, if patient's renal function permits. Small amount of nonspecific free intraperitoneal fluid. Mild dilatation of small bowel loops question ileus with some of the small bowel loops in the LEFT abdomen showing mild wall thickening, which could reflect enteritis or secondary peritonitis. Cholelithiasis. Small RIGHT pleural effusion and subsegmental atelectasis RIGHT lower lobe. Chronic interstitial lung disease changes at both lung bases. Aortic Atherosclerosis (ICD10-I70.0). Electronically Signed   By: Lavonia Dana M.D.   On: 06/08/2021 16:27    I independently reviewed the above imaging studies.  Impression/Recommendation: Sigmoid diverticulitis-scheduled for abdominal exploration probable colostomy tomorrow Plan: Schedule for cystoscopy insertion of Left JJ stents for intraoperative identification prior to surgical exploration.  Risks and benefits procedure were discussed in detail with the patient today.  Remi Haggard 06/09/2021, 3:26 PM     CC:

## 2021-06-09 NOTE — Consult Note (Addendum)
[facility] Nurse requested for preoperative stoma site marking  Discussed surgical procedure and stoma creation with patient.  Explained role of the [facility] nurse team.  Provided the patient with educational booklet and provided samples of pouching options. Answered patient's questions.   Examined patient lying and sitting, in order to place the marking in the patient's visual field, away from any creases or abdominal contour issues and within the rectus muscle.  Attempted to mark below the patient's belt line. Pt's abd is very tight, distended, and painful and there may be folds which appear later when this is resolved which are not present at this time.  Marked for colostomy in the LLQ  __7__ cm to the left of the umbilicus and _1___JD below the umbilicus.  Marked for ileostomy in the RLQ  __6__cm to the right of the umbilicus and  __5__ cm below the umbilicus.  Patient's abdomen cleansed with CHG wipes at site markings, allowed to air dry prior to marking. Pt plans for surgery later today.  [facility] Nurse team will follow up with patient after surgery for continued ostomy care and teaching.  Julien Girt MSN, RN, [place], [place], [place]

## 2021-06-09 NOTE — Progress Notes (Signed)
PROGRESS NOTE    Brittany Hobbs  JGG:[id number] DOB: 1961/01/02 DOA: 06/03/2021 PCP: Berkley Harvey, NP   Chief Complain: Abdominal pain  Brief Narrative:  Patient is a 60 year old female with history of anxiety, lupus who presented with abdominal pain.  She has recent history of total knee replacement.  Imagings of the abdomen done on presentation showed diverticulitis with pneumoperitoneum suggesting perforation.  General surgery consulted and following. She was started  on conservative management with IV antibiotics.  She was improving but again developed abdominal pain and distention.  General surgery now planning for sigmoid colectomy and end colostomy  Assessment & Plan:   Active Problems:   Acute diverticulitis   Acute diverticulitis with perforation: Started on Zosyn, continue the same for now.  General surgery following.   She was overall improving but she complained of bloating on 06/08/21 .CT-scan of the abdomen and showed acute sigmoid diverticulitis without definite evidence of perforation or abscess collection,possible distended fluid-filled small bowel loops .  Her abdomen remains distended.  General surgery now planning for sigmoid colectomy and end colostomy.  Currently n.p.o.  Leukocytosis: Mild elevation.  Patient is afebrile. Continue to monitor  Right lower extremity edema: Recent history of TKA.  Has stitches.Follow-up with orthopedics recommended as an outpatient.  Doppler study did not show DVT.  Anxiety: On anxiety medications as needed  Lupus: Resumed home medication  prednisone 10 mg daily  Hypokalemia: Supplemented potassium          DVT prophylaxis:Lovenox Code Status: Full Family Communication: None at bedside Status is: Inpatient  Remains inpatient appropriate because:Inpatient level of care appropriate due to severity of illness  Dispo: The patient is from: Home              Anticipated d/c is to: Home              Patient currently is  not medically stable to d/c.   Difficult to place patient No      Consultants: General surgery  Procedures:None  Antimicrobials:  Anti-infectives (From admission, onward)    Start     Dose/Rate Route Frequency Ordered Stop   06/03/21 2000  piperacillin-tazobactam (ZOSYN) IVPB 3.375 g        3.375 g 12.5 mL/hr over 240 Minutes Intravenous Every 8 hours 06/03/21 1528     06/03/21 1330  piperacillin-tazobactam (ZOSYN) IVPB 3.375 g        3.375 g 100 mL/hr over 30 Minutes Intravenous  Once 06/03/21 1315 06/03/21 1527       Subjective:  Patient seen and examined the bedside this morning.  Hemodynamically stable.  Complains of increased abdominal discomfort, distention and pain  Objective: Vitals:   06/08/21 0501 06/08/21 1342 06/08/21 2108 06/09/21 0529  BP: 122/70 124/73 (!) 162/86 (!) 160/84  Pulse: (!) 102 78 76 (!) 105  Resp: 16 18 18 18   Temp: 100 F (37.8 C) 98.1 F (36.7 C) 98.7 F (37.1 C) 99.2 F (37.3 C)  TempSrc: Oral Oral Oral Oral  SpO2: (!) 84% 91% 93% 93%  Weight:      Height:        Intake/Output Summary (Last 24 hours) at 06/09/2021 0758 Last data filed at 06/09/2021 5465 Gross per 24 hour  Intake 1867.93 ml  Output 1100 ml  Net 767.93 ml   Filed Weights   06/03/21 1525  Weight: 64 kg    Examination:  General exam: Uncomfortable due to abdominal discomfort HEENT: PERRL Respiratory system:  no  wheezes or crackles  Cardiovascular system: S1 & S2 heard, RRR.  Gastrointestinal system: Abdomen is distended, generalized tenderness, no bowel sounds heard today Central nervous system: Alert and oriented Extremities: No edema, no clubbing ,no cyanosis Skin: No rashes, no ulcers,no icterus    Data Reviewed: I have personally reviewed following labs and imaging studies  CBC: Recent Labs  Lab 06/03/21 0921 06/04/21 0435 06/05/21 0422 06/06/21 0339 06/07/21 0419 06/08/21 0400 06/09/21 0423  WBC 10.1   < > 16.3* 13.6* 11.6* 15.5* 14.5*   NEUTROABS 8.9*  --   --  11.5*  --   --  12.1*  HGB 13.7   < > 12.0 12.3 11.9* 11.8* 12.0  HCT 42.3   < > 37.0 38.4 37.7 37.3 36.9  MCV 88.9   < > 88.9 88.9 89.1 88.8 85.6  PLT 357   < > 336 334 305 325 356   < > = values in this interval not displayed.   Basic Metabolic Panel: Recent Labs  Lab 06/03/21 0921 06/04/21 0435 06/06/21 0339 06/09/21 0423  NA 143 143 140 138  K 4.1 4.8 3.8 3.4*  CL 109 104 103 99  CO2 25 28 27 30   GLUCOSE 107* 87 97 96  BUN 28* 23* 19 12  CREATININE 0.62 0.66 0.64 0.52  CALCIUM 9.6 9.1 8.7* 8.3*   GFR: Estimated Creatinine Clearance: 68.1 mL/min (by C-G formula based on SCr of 0.52 mg/dL). Liver Function Tests: Recent Labs  Lab 06/03/21 0921 06/04/21 0435  AST 24 16  ALT 14 11  ALKPHOS 120 97  BILITOT 1.4* 1.0  PROT 7.2 6.6  ALBUMIN 3.8 3.1*   Recent Labs  Lab 06/03/21 0921  LIPASE 22   No results for input(s): AMMONIA in the last 168 hours. Coagulation Profile: No results for input(s): INR, PROTIME in the last 168 hours. Cardiac Enzymes: No results for input(s): CKTOTAL, CKMB, CKMBINDEX, TROPONINI in the last 168 hours. BNP (last 3 results) No results for input(s): PROBNP in the last 8760 hours. HbA1C: No results for input(s): HGBA1C in the last 72 hours. CBG: No results for input(s): GLUCAP in the last 168 hours. Lipid Profile: No results for input(s): CHOL, HDL, LDLCALC, TRIG, CHOLHDL, LDLDIRECT in the last 72 hours. Thyroid Function Tests: No results for input(s): TSH, T4TOTAL, FREET4, T3FREE, THYROIDAB in the last 72 hours. Anemia Panel: No results for input(s): VITAMINB12, FOLATE, FERRITIN, TIBC, IRON, RETICCTPCT in the last 72 hours. Sepsis Labs: No results for input(s): PROCALCITON, LATICACIDVEN in the last 168 hours.  Recent Results (from the past 240 hour(s))  Resp Panel by RT-PCR (Flu A&B, Covid) Nasopharyngeal Swab     Status: None   Collection Time: 06/03/21  9:51 AM   Specimen: Nasopharyngeal Swab;  Nasopharyngeal(NP) swabs in vial transport medium  Result Value Ref Range Status   SARS Coronavirus 2 by RT PCR NEGATIVE NEGATIVE Final    Comment: (NOTE) SARS-CoV-2 target nucleic acids are NOT DETECTED.  The SARS-CoV-2 RNA is generally detectable in upper respiratory specimens during the acute phase of infection. The lowest concentration of SARS-CoV-2 viral copies this assay can detect is 138 copies/mL. A negative result does not preclude SARS-Cov-2 infection and should not be used as the sole basis for treatment or other patient management decisions. A negative result may occur with  improper specimen collection/handling, submission of specimen other than nasopharyngeal swab, presence of viral mutation(s) within the areas targeted by this assay, and inadequate number of viral copies(<138 copies/mL). A negative result  must be combined with clinical observations, patient history, and epidemiological information. The expected result is Negative.  Fact Sheet for Patients:  EntrepreneurPulse.com.au  Fact Sheet for Healthcare Providers:  IncredibleEmployment.be  This test is no t yet approved or cleared by the [place] FDA and  has been authorized for detection and/or diagnosis of SARS-CoV-2 by FDA under an Emergency Use Authorization (EUA). This EUA will remain  in effect (meaning this test can be used) for the duration of the COVID-19 declaration under Section 564(b)(1) of the Act, 21 U.S.C.section 360bbb-3(b)(1), unless the authorization is terminated  or revoked sooner.       Influenza A by PCR NEGATIVE NEGATIVE Final   Influenza B by PCR NEGATIVE NEGATIVE Final    Comment: (NOTE) The Xpert Xpress SARS-CoV-2/FLU/RSV plus assay is intended as an aid in the diagnosis of influenza from Nasopharyngeal swab specimens and should not be used as a sole basis for treatment. Nasal washings and aspirates are unacceptable for Xpert Xpress  SARS-CoV-2/FLU/RSV testing.  Fact Sheet for Patients: EntrepreneurPulse.com.au  Fact Sheet for Healthcare Providers: IncredibleEmployment.be  This test is not yet approved or cleared by the [place] FDA and has been authorized for detection and/or diagnosis of SARS-CoV-2 by FDA under an Emergency Use Authorization (EUA). This EUA will remain in effect (meaning this test can be used) for the duration of the COVID-19 declaration under Section 564(b)(1) of the Act, 21 U.S.C. section 360bbb-3(b)(1), unless the authorization is terminated or revoked.  Performed at [facility], [place] [address], [place], [place] [address]          Radiology Studies: CT ABDOMEN PELVIS WO CONTRAST  Result Date: 06/08/2021 CLINICAL DATA:  Lower abdominal pain, history of diverticulitis, question complication EXAM: CT ABDOMEN AND PELVIS WITHOUT CONTRAST TECHNIQUE: Multidetector CT imaging of the abdomen and pelvis was performed following the standard protocol without IV contrast. Sagittal and coronal MPR images reconstructed from axial data set. No oral contrast administered. COMPARISON:  06/03/2021 FINDINGS: Lower chest: Small RIGHT pleural effusion and subsegmental atelectasis RIGHT lower lobe. Peripheral chronic interstitial lung disease changes at both lung bases. Hepatobiliary: Multiple calcified gallstones within a mildly distended gallbladder. Liver unremarkable. Pancreas: Normal appearance Spleen: Normal appearance Adrenals/Urinary Tract: Adrenal glands, kidneys, ureters, and bladder normal appearance Stomach/Bowel: Normal appendix. Stomach normal appearance. Some of the small bowel loops are dilated favoring ileus. Normal appearance of colon from cecum through splenic flexure. Diverticulosis of descending and sigmoid colon with again identified wall thickening and pericolic inflammatory changes at the sigmoid colon consistent with acute  diverticulitis. Fluid collections in the central pelvis may represent fluid-filled bowel loops though due to lack of IV and oral contrast, it is difficult to completely exclude interloop collections. Vascular/Lymphatic: Atherosclerotic calcifications of aorta and iliac arteries. Aorta normal caliber. No adenopathy. Reproductive: Unremarkable uterus.  Ovaries obscured. Other: Small amount of free fluid perihepatic, LEFT gutter, and suspect interloop in the LEFT mid abdomen. No hernia. No free intraperitoneal air. Musculoskeletal: Superior endplate compression deformities of T12, L2, L3, and L4. Degenerative disc disease changes L5-S1. IMPRESSION: Acute sigmoid diverticulitis without definite evidence of perforation or abscess collection. Fluid collections in the central pelvis may represent distended fluid-filled small bowel loops though it is difficult to completely exclude interloop collections/abscess; may consider follow-up CT imaging with IV contrast to better assess, if patient's renal function permits. Small amount of nonspecific free intraperitoneal fluid. Mild dilatation of small bowel loops question ileus with some of the small bowel loops in the  LEFT abdomen showing mild wall thickening, which could reflect enteritis or secondary peritonitis. Cholelithiasis. Small RIGHT pleural effusion and subsegmental atelectasis RIGHT lower lobe. Chronic interstitial lung disease changes at both lung bases. Aortic Atherosclerosis (ICD10-I70.0). Electronically Signed   By: Lavonia Dana M.D.   On: 06/08/2021 16:27        Scheduled Meds:  acetaminophen  1,000 mg Oral Q6H   enoxaparin (LOVENOX) injection  40 mg Subcutaneous Q24H   escitalopram  20 mg Oral Daily   gabapentin  800 mg Oral TID   LORazepam  0.5 mg Oral q morning   LORazepam  1 mg Oral QPM   metoprolol tartrate  12.5 mg Oral Daily   predniSONE  10 mg Oral Q breakfast   Continuous Infusions:  lactated ringers 50 mL/hr at 06/07/21 1704    piperacillin-tazobactam (ZOSYN)  IV 3.375 g (06/09/21 0237)     LOS: 6 days    Time spent: 25 mins.More than 50% of that time was spent in counseling and/or coordination of care.      Shelly Coss, MD [facility] P9/22/2022, 7:58 AM

## 2021-06-10 ENCOUNTER — Inpatient Hospital Stay (HOSPITAL_COMMUNITY): Payer: BC Managed Care – PPO | Admitting: Certified Registered Nurse Anesthetist

## 2021-06-10 ENCOUNTER — Encounter (HOSPITAL_COMMUNITY)
Admission: EM | Disposition: A | Discharge status: Home Health | Payer: Self-pay | Source: Home / Self Care | Attending: Internal Medicine

## 2021-06-10 DIAGNOSIS — K5792 Diverticulitis of intestine, part unspecified, without perforation or abscess without bleeding: Secondary | ICD-10-CM | POA: Diagnosis not present

## 2021-06-10 HISTORY — PX: CYSTOSCOPY WITH STENT PLACEMENT: SHX5790

## 2021-06-10 HISTORY — PX: COLON RESECTION SIGMOID: SHX6737

## 2021-06-10 LAB — RAPID URINE DRUG SCREEN, HOSP PERFORMED
Amphetamines: NOT DETECTED
Barbiturates: NOT DETECTED
Benzodiazepines: NOT DETECTED
Cocaine: NOT DETECTED
Opiates: POSITIVE — AB
Tetrahydrocannabinol: POSITIVE — AB

## 2021-06-10 LAB — CBC WITH DIFFERENTIAL/PLATELET
Abs Immature Granulocytes: 0.18 10*3/uL — ABNORMAL HIGH (ref 0.00–0.07)
Basophils Absolute: 0 10*3/uL (ref 0.0–0.1)
Basophils Relative: 0 %
Eosinophils Absolute: 0 10*3/uL (ref 0.0–0.5)
Eosinophils Relative: 0 %
HCT: 36.9 % (ref 36.0–46.0)
Hemoglobin: 12.1 g/dL (ref 12.0–15.0)
Immature Granulocytes: 1 %
Lymphocytes Relative: 9 %
Lymphs Abs: 1.2 10*3/uL (ref 0.7–4.0)
MCH: 28.2 pg (ref 26.0–34.0)
MCHC: 32.8 g/dL (ref 30.0–36.0)
MCV: 86 fL (ref 80.0–100.0)
Monocytes Absolute: 0.8 10*3/uL (ref 0.1–1.0)
Monocytes Relative: 6 %
Neutro Abs: 11.8 10*3/uL — ABNORMAL HIGH (ref 1.7–7.7)
Neutrophils Relative %: 84 %
Platelets: 353 10*3/uL (ref 150–400)
RBC: 4.29 MIL/uL (ref 3.87–5.11)
RDW: 16 % — ABNORMAL HIGH (ref 11.5–15.5)
WBC: 14 10*3/uL — ABNORMAL HIGH (ref 4.0–10.5)
nRBC: 0.1 % (ref 0.0–0.2)

## 2021-06-10 LAB — BASIC METABOLIC PANEL
Anion gap: 12 (ref 5–15)
BUN: 10 mg/dL (ref 6–20)
CO2: 29 mmol/L (ref 22–32)
Calcium: 8.7 mg/dL — ABNORMAL LOW (ref 8.9–10.3)
Chloride: 100 mmol/L (ref 98–111)
Creatinine, Ser: 0.54 mg/dL (ref 0.44–1.00)
GFR, Estimated: >60 mL/min (ref >60)
Glucose, Bld: 84 mg/dL (ref 70–99)
Potassium: 3.8 mmol/L (ref 3.5–5.1)
Sodium: 141 mmol/L (ref 135–145)

## 2021-06-10 SURGERY — COLECTOMY, SIGMOID, OPEN
Anesthesia: General

## 2021-06-10 SURGERY — COLECTOMY, SIGMOID, OPEN
Anesthesia: General | Site: Ureter

## 2021-06-10 MED ORDER — HYDROMORPHONE HCL 1 MG/ML IJ SOLN
INTRAMUSCULAR | Status: DC | PRN
  Administered 2021-06-10 (×2): 1 mg via INTRAVENOUS

## 2021-06-10 MED ORDER — DEXAMETHASONE SODIUM PHOSPHATE 10 MG/ML IJ SOLN
INTRAMUSCULAR | Status: DC | PRN
  Administered 2021-06-10: 6 mg via INTRAVENOUS

## 2021-06-10 MED ORDER — ACETAMINOPHEN 10 MG/ML IV SOLN
INTRAVENOUS | Status: DC | PRN
  Administered 2021-06-10: 1000 mg via INTRAVENOUS

## 2021-06-10 MED ORDER — SUFENTANIL CITRATE 50 MCG/ML IV SOLN
INTRAVENOUS | Status: AC
  Filled 2021-06-10: qty 1

## 2021-06-10 MED ORDER — OXYCODONE HCL 5 MG/5ML PO SOLN: 5.0000 mg | Freq: Once | ORAL | Status: DC | PRN

## 2021-06-10 MED ORDER — ROCURONIUM BROMIDE 10 MG/ML (PF) SYRINGE
PREFILLED_SYRINGE | INTRAVENOUS | Status: DC | PRN
  Administered 2021-06-10: 40 mg via INTRAVENOUS
  Administered 2021-06-10: 50 mg via INTRAVENOUS

## 2021-06-10 MED ORDER — PROPOFOL 10 MG/ML IV BOLUS
INTRAVENOUS | Status: DC | PRN
  Administered 2021-06-10: 130 mg via INTRAVENOUS

## 2021-06-10 MED ORDER — HYDROCORTISONE SOD SUC (PF) 100 MG IJ SOLR
INTRAMUSCULAR | Status: DC | PRN
  Administered 2021-06-10: 100 mg via INTRAVENOUS

## 2021-06-10 MED ORDER — SUGAMMADEX SODIUM 200 MG/2ML IV SOLN
INTRAVENOUS | Status: DC | PRN
  Administered 2021-06-10: 200 mg via INTRAVENOUS

## 2021-06-10 MED ORDER — ONDANSETRON HCL 4 MG/2ML IJ SOLN
INTRAMUSCULAR | Status: AC
  Filled 2021-06-10: qty 2

## 2021-06-10 MED ORDER — LIDOCAINE 2% (20 MG/ML) 5 ML SYRINGE
INTRAMUSCULAR | Status: DC | PRN
  Administered 2021-06-10: 75 mg via INTRAVENOUS

## 2021-06-10 MED ORDER — SODIUM CHLORIDE 0.9% FLUSH: 9.0000 mL | INTRAVENOUS | Status: DC | PRN

## 2021-06-10 MED ORDER — FENTANYL CITRATE PF 50 MCG/ML IJ SOSY
25.0000 ug | PREFILLED_SYRINGE | INTRAMUSCULAR | Status: DC | PRN
  Administered 2021-06-10: 50 ug via INTRAVENOUS

## 2021-06-10 MED ORDER — HYDROCORTISONE SOD SUC (PF) 100 MG IJ SOLR
INTRAMUSCULAR | Status: AC
  Filled 2021-06-10: qty 2

## 2021-06-10 MED ORDER — DIPHENHYDRAMINE HCL 50 MG/ML IJ SOLN: 12.5000 mg | Freq: Four times a day (QID) | INTRAMUSCULAR | Status: DC | PRN

## 2021-06-10 MED ORDER — FENTANYL CITRATE PF 50 MCG/ML IJ SOSY
PREFILLED_SYRINGE | INTRAMUSCULAR | Status: AC
  Administered 2021-06-10: 50 ug via INTRAVENOUS
  Filled 2021-06-10: qty 1

## 2021-06-10 MED ORDER — DEXMEDETOMIDINE (PRECEDEX) IN NS 20 MCG/5ML (4 MCG/ML) IV SYRINGE
PREFILLED_SYRINGE | INTRAVENOUS | Status: DC | PRN
  Administered 2021-06-10: 4 ug via INTRAVENOUS
  Administered 2021-06-10: 8 ug via INTRAVENOUS

## 2021-06-10 MED ORDER — ACETAMINOPHEN 10 MG/ML IV SOLN
INTRAVENOUS | Status: AC
  Filled 2021-06-10: qty 100

## 2021-06-10 MED ORDER — HYDROMORPHONE HCL 2 MG/ML IJ SOLN
INTRAMUSCULAR | Status: AC
  Filled 2021-06-10: qty 1

## 2021-06-10 MED ORDER — ONDANSETRON HCL 4 MG/2ML IJ SOLN
INTRAMUSCULAR | Status: DC | PRN
  Administered 2021-06-10: 4 mg via INTRAVENOUS

## 2021-06-10 MED ORDER — ONDANSETRON HCL 4 MG/2ML IJ SOLN: 4.0000 mg | Freq: Four times a day (QID) | INTRAMUSCULAR | Status: AC | PRN

## 2021-06-10 MED ORDER — PROPOFOL 10 MG/ML IV BOLUS
INTRAVENOUS | Status: AC
  Filled 2021-06-10: qty 20

## 2021-06-10 MED ORDER — LIDOCAINE HCL (PF) 2 % IJ SOLN
INTRAMUSCULAR | Status: AC
  Filled 2021-06-10: qty 5

## 2021-06-10 MED ORDER — ONDANSETRON HCL 4 MG/2ML IJ SOLN
INTRAMUSCULAR | Status: AC
  Administered 2021-06-10: 4 mg via INTRAVENOUS
  Filled 2021-06-10: qty 2

## 2021-06-10 MED ORDER — LABETALOL HCL 5 MG/ML IV SOLN
INTRAVENOUS | Status: AC
  Filled 2021-06-10: qty 4

## 2021-06-10 MED ORDER — MIDAZOLAM HCL 5 MG/5ML IJ SOLN
INTRAMUSCULAR | Status: DC | PRN
  Administered 2021-06-10: 1 mg via INTRAVENOUS
  Administered 2021-06-10: 2 mg via INTRAVENOUS

## 2021-06-10 MED ORDER — PHENYLEPHRINE 40 MCG/ML (10ML) SYRINGE FOR IV PUSH (FOR BLOOD PRESSURE SUPPORT)
PREFILLED_SYRINGE | INTRAVENOUS | Status: AC
  Filled 2021-06-10: qty 10

## 2021-06-10 MED ORDER — NALOXONE HCL 0.4 MG/ML IJ SOLN: 0.4000 mg | INTRAMUSCULAR | Status: DC | PRN

## 2021-06-10 MED ORDER — CHLORHEXIDINE GLUCONATE CLOTH 2 % EX PADS
6.0000 | MEDICATED_PAD | Freq: Every day | CUTANEOUS | Status: DC
  Administered 2021-06-11: 6 via TOPICAL

## 2021-06-10 MED ORDER — 0.9 % SODIUM CHLORIDE (POUR BTL) OPTIME
TOPICAL | Status: DC | PRN
  Administered 2021-06-10: 5000 mL

## 2021-06-10 MED ORDER — DEXAMETHASONE SODIUM PHOSPHATE 10 MG/ML IJ SOLN
INTRAMUSCULAR | Status: AC
  Filled 2021-06-10: qty 1

## 2021-06-10 MED ORDER — LIP MEDEX EX OINT
TOPICAL_OINTMENT | CUTANEOUS | Status: AC
  Filled 2021-06-10: qty 7

## 2021-06-10 MED ORDER — LABETALOL HCL 5 MG/ML IV SOLN
INTRAVENOUS | Status: DC | PRN
  Administered 2021-06-10: 5 mg via INTRAVENOUS

## 2021-06-10 MED ORDER — LACTATED RINGERS IV SOLN: INTRAVENOUS | Status: DC | PRN

## 2021-06-10 MED ORDER — HYDROMORPHONE 1 MG/ML IV SOLN
INTRAVENOUS | Status: DC
  Administered 2021-06-11: 2.7 mg via INTRAVENOUS
  Administered 2021-06-11: 2.2 mg via INTRAVENOUS
  Administered 2021-06-11: 4.2 mg via INTRAVENOUS
  Administered 2021-06-11: 3.3 mg via INTRAVENOUS
  Administered 2021-06-11: 1.5 mg via INTRAVENOUS
  Administered 2021-06-12: 2.1 mg via INTRAVENOUS
  Administered 2021-06-12: 3.3 mg via INTRAVENOUS
  Administered 2021-06-12: 1.8 mg via INTRAVENOUS
  Administered 2021-06-12: 2.1 mg via INTRAVENOUS
  Administered 2021-06-13: 2.5 mg via INTRAVENOUS
  Administered 2021-06-13: 5.4 mg via INTRAVENOUS
  Administered 2021-06-13: 1.8 mg via INTRAVENOUS
  Administered 2021-06-13: 2.5 mg via INTRAVENOUS
  Filled 2021-06-10 (×2): qty 30

## 2021-06-10 MED ORDER — DIPHENHYDRAMINE HCL 12.5 MG/5ML PO ELIX
12.5000 mg | ORAL_SOLUTION | Freq: Four times a day (QID) | ORAL | Status: DC | PRN
  Filled 2021-06-10: qty 5

## 2021-06-10 MED ORDER — ROCURONIUM BROMIDE 10 MG/ML (PF) SYRINGE
PREFILLED_SYRINGE | INTRAVENOUS | Status: AC
  Filled 2021-06-10: qty 10

## 2021-06-10 MED ORDER — FENTANYL CITRATE PF 50 MCG/ML IJ SOSY
PREFILLED_SYRINGE | INTRAMUSCULAR | Status: AC
  Filled 2021-06-10: qty 1

## 2021-06-10 MED ORDER — MIDAZOLAM HCL 2 MG/2ML IJ SOLN
INTRAMUSCULAR | Status: AC
  Filled 2021-06-10: qty 4

## 2021-06-10 MED ORDER — ARTIFICIAL TEARS OPHTHALMIC OINT
TOPICAL_OINTMENT | OPHTHALMIC | Status: AC
  Filled 2021-06-10: qty 3.5

## 2021-06-10 MED ORDER — OXYCODONE HCL 5 MG PO TABS: 5.0000 mg | ORAL_TABLET | Freq: Once | ORAL | Status: DC | PRN

## 2021-06-10 MED ORDER — SUFENTANIL CITRATE 50 MCG/ML IV SOLN
INTRAVENOUS | Status: DC | PRN
  Administered 2021-06-10: 15 ug via INTRAVENOUS
  Administered 2021-06-10: 5 ug via INTRAVENOUS
  Administered 2021-06-10: 10 ug via INTRAVENOUS
  Administered 2021-06-10 (×4): 5 ug via INTRAVENOUS
  Administered 2021-06-10: 10 ug via INTRAVENOUS
  Administered 2021-06-10: 15 ug via INTRAVENOUS
  Administered 2021-06-10: 10 ug via INTRAVENOUS

## 2021-06-10 SURGICAL SUPPLY — 64 items
ADAPTER GOLDBERG URETERAL (ADAPTER) ×1 IMPLANT
ADPR CATH 15X14FR FL DRN BG (ADAPTER) ×2
APL PRP STRL LF DISP 70% ISPRP (MISCELLANEOUS) ×2
BAG COUNTER SPONGE SURGICOUNT (BAG) IMPLANT
BAG SPNG CNTER NS LX DISP (BAG)
BARRIER SKIN 2 1/4 (WOUND CARE) ×3 IMPLANT
BARRIER SKIN OD1.75 2 1/4 FLNG (WOUND CARE) IMPLANT
BNDG GAUZE ELAST 4 BULKY (GAUZE/BANDAGES/DRESSINGS) ×1 IMPLANT
BRR ADH 6X5 SEPRAFILM 1 SHT (MISCELLANEOUS) ×2
BRR SKN FLT 1.75X2.25 2 PC (WOUND CARE) ×2
CATH URET 5FR 28IN OPEN ENDED (CATHETERS) ×1 IMPLANT
CHLORAPREP W/TINT 26 (MISCELLANEOUS) ×1 IMPLANT
COVER MAYO STAND STRL (DRAPES) ×2 IMPLANT
COVER SURGICAL LIGHT HANDLE (MISCELLANEOUS) ×3 IMPLANT
DRAIN CHANNEL 19F RND (DRAIN) ×1 IMPLANT
DRAPE LAPAROSCOPIC ABDOMINAL (DRAPES) ×3 IMPLANT
DRAPE WARM FLUID 44X44 (DRAPES) ×1 IMPLANT
DRSG OPSITE POSTOP 4X10 (GAUZE/BANDAGES/DRESSINGS) IMPLANT
DRSG OPSITE POSTOP 4X6 (GAUZE/BANDAGES/DRESSINGS) IMPLANT
DRSG OPSITE POSTOP 4X8 (GAUZE/BANDAGES/DRESSINGS) IMPLANT
DRSG PAD ABDOMINAL 8X10 ST (GAUZE/BANDAGES/DRESSINGS) ×1 IMPLANT
ELECT REM PT RETURN 15FT ADLT (MISCELLANEOUS) ×3 IMPLANT
EVACUATOR SILICONE 100CC (DRAIN) ×1 IMPLANT
GLOVE SURG ENC MOIS LTX SZ6 (GLOVE) ×6 IMPLANT
GLOVE SURG POLYISO LF SZ5.5 (GLOVE) ×4 IMPLANT
GLOVE SURG UNDER LTX SZ6.5 (GLOVE) ×6 IMPLANT
GLOVE SURG UNDER POLY LF SZ6 (GLOVE) ×6 IMPLANT
GOWN STRL REUS W/TWL LRG LVL3 (GOWN DISPOSABLE) ×5 IMPLANT
GOWN STRL REUS W/TWL XL LVL3 (GOWN DISPOSABLE) ×3 IMPLANT
GUIDEWIRE SENSOR ANG DUAL FLEX (WIRE) ×1 IMPLANT
KIT TURNOVER KIT A (KITS) ×3 IMPLANT
LIGASURE IMPACT 36 18CM CVD LR (INSTRUMENTS) ×1 IMPLANT
PACK CYSTO (CUSTOM PROCEDURE TRAY) ×1 IMPLANT
PACK GENERAL/GYN (CUSTOM PROCEDURE TRAY) ×3 IMPLANT
POUCH OSTOMY 2 PC DRNBL 2.25 (WOUND CARE) IMPLANT
POUCH OSTOMY DRNBL 2 1/4 (WOUND CARE) ×3
RETRACTOR WND ALEXIS 25 LRG (MISCELLANEOUS) IMPLANT
RTRCTR WOUND ALEXIS 25CM LRG (MISCELLANEOUS) ×3
SEPRAFILM MEMBRANE 5X6 (MISCELLANEOUS) ×1 IMPLANT
SPONGE T-LAP 18X18 ~~LOC~~+RFID (SPONGE) ×2 IMPLANT
STAPLER CVD CUT BL 40 RELOAD (ENDOMECHANICALS) ×3 IMPLANT
STAPLER CVD CUT BLU 40 RELOAD (ENDOMECHANICALS) IMPLANT
STAPLER PROXIMATE 75MM BLUE (STAPLE) ×1 IMPLANT
STAPLER VISISTAT 35W (STAPLE) IMPLANT
SUT ETHILON 2 0 PS N (SUTURE) ×1 IMPLANT
SUT ETHILON 3 0 PS 1 (SUTURE) IMPLANT
SUT MNCRL AB 4-0 PS2 18 (SUTURE) IMPLANT
SUT NOVA T20/GS 25 (SUTURE) IMPLANT
SUT PDS AB 1 TP1 96 (SUTURE) ×2 IMPLANT
SUT PROLENE 2 0 CT 1 (SUTURE) ×1 IMPLANT
SUT SILK 2 0 (SUTURE) ×3
SUT SILK 2 0 SH CR/8 (SUTURE) ×4 IMPLANT
SUT SILK 2-0 18XBRD TIE 12 (SUTURE) ×2 IMPLANT
SUT SILK 2-0 30XBRD TIE 12 (SUTURE) IMPLANT
SUT SILK 3 0 (SUTURE)
SUT SILK 3 0 12 30 (SUTURE) ×1 IMPLANT
SUT SILK 3 0 SH CR/8 (SUTURE) ×4 IMPLANT
SUT SILK 3-0 18XBRD TIE 12 (SUTURE) ×2 IMPLANT
SUT VIC AB 3-0 SH 8-18 (SUTURE) ×2 IMPLANT
TAPE CLOTH SURG 6X10 WHT LF (GAUZE/BANDAGES/DRESSINGS) ×1 IMPLANT
TOWEL OR 17X26 10 PK STRL BLUE (TOWEL DISPOSABLE) ×1 IMPLANT
TOWEL OR NON WOVEN STRL DISP B (DISPOSABLE) ×3 IMPLANT
TRAY FOLEY MTR SLVR 14FR STAT (SET/KITS/TRAYS/PACK) ×3 IMPLANT
TRAY FOLEY MTR SLVR 16FR STAT (SET/KITS/TRAYS/PACK) ×2 IMPLANT

## 2021-06-10 NOTE — Progress Notes (Addendum)
Pt remains NPO, found wandering, staggering in rm, crying and talking to herself. Pt had stripped off her gown and was squatting over the trashcan peeing/defecating in it. Pt talking nonsense and gibberish, continued crying but is A & O X 4. Assisted pt back to bed, assisted w pericare and new gown, bed alarm on. MD notified of above.

## 2021-06-10 NOTE — Anesthesia Preprocedure Evaluation (Signed)
Anesthesia Evaluation  Patient identified by MRN, date of birth, ID band Patient awake    Reviewed: Allergy & Precautions, H&P , NPO status , Patient's Chart, lab work & pertinent test results  History of Anesthesia Complications (+) PONV and history of anesthetic complications  Airway Mallampati: II   Neck ROM: full    Dental   Pulmonary asthma , Current Smoker,    breath sounds clear to auscultation       Cardiovascular hypertension,  Rhythm:regular Rate:Normal     Neuro/Psych PSYCHIATRIC DISORDERS Anxiety    GI/Hepatic   Endo/Other    Renal/GU      Musculoskeletal  (+) Arthritis ,   Abdominal   Peds  Hematology lupus   Anesthesia Other Findings   Reproductive/Obstetrics                             Anesthesia Physical Anesthesia Plan  ASA: 2  Anesthesia Plan: General   Post-op Pain Management:    Induction: Intravenous  PONV Risk Score and Plan: 3 and Ondansetron, Dexamethasone, Midazolam and Treatment may vary due to age or medical condition  Airway Management Planned: Oral ETT  Additional Equipment:   Intra-op Plan:   Post-operative Plan: Extubation in OR  Informed Consent: I have reviewed the patients History and Physical, chart, labs and discussed the procedure including the risks, benefits and alternatives for the proposed anesthesia with the patient or authorized representative who has indicated his/her understanding and acceptance.     Dental advisory given  Plan Discussed with: Anesthesiologist, Surgeon and CRNA  Anesthesia Plan Comments:         Anesthesia Quick Evaluation

## 2021-06-10 NOTE — Op Note (Addendum)
Date: 06/10/21  Patient: Brittany Hobbs MRN: [id number]  Preoperative Diagnosis: Perforated sigmoid diverticulitis Postoperative Diagnosis: Perforated sigmoid diverticulitis with pelvic abscess  Procedure: Exploratory laparotomy with washout of intra-abdominal abscess, sigmoid colectomy, end colostomy, placement of intra-abdominal drain  Surgeon: Michaelle Birks, MD Assistant: Lorine Bears, MD  EBL: 200 mL  Anesthesia: Enteral endotracheal  Specimens: Sigmoid colon, stitch marks distal margin  Indications: Ms. Brittany Hobbs is a 60 year old female who was admitted 1 week ago with sigmoid diverticulitis with a contained perforation.  This is her third episode of diverticulitis in the last 2 years.  She was started on IV antibiotics and managed nonoperatively, but has had worsening abdominal pain and distention, with persistent leukocytosis.  Because of her failure to improve with antibiotic therapy the decision was made to proceed with operative intervention.  Findings: Large diverticular abscess, with significant inflammation of the sigmoid colon.  Sigmoid colon resection with end colostomy performed.  49 [place] JP drain left next of the rectal stump.  Procedure details: Informed consent was obtained in the preoperative area prior to the procedure.  Patient was marked for a stoma preoperatively by the wound ostomy team.  The patient was brought to the operating room, general anesthesia was induced and appropriate lines and drains were placed for intraoperative monitoring. Perioperative antibiotics were administered per SCIP guidelines.   Prior to my portion of the procedure, a cystoscopy with placement of left ureteral stent was performed by Dr. Abner Greenspan.  Please see his separately dictated operative note for details regarding that portion of the procedure.   The patient was then placed in the supine position and the abdomen was prepped and draped in the usual sterile fashion. A pre-procedure timeout was  taken verifying patient identity, surgical site and procedure to be performed.  A lower midline skin incision was made and extended above the umbilicus superiorly, and inferiorly to the pubic symphysis.  The subcutaneous tissue was divided with cautery and the fascia was opened at the linea alba.  Peritoneum was opened and the peritoneal cavity was entered.  The small bowel was distended and mildly inflamed.  The omentum was adherent to the abdominal wall in the pelvis, and this was taken down using blunt dissection.  Large walled off abscess within the pelvis was entered during this dissection and drained.  Cultures of the abscess were sent.  There were some interloop inflammatory adhesions on the small bowel which were gently broken up using blunt dissection.  Bookwalter fixed retractor was placed, and the small bowel was partially eviscerated and retracted to the right side to expose the descending colon.  The sigmoid colon was markedly inflamed.  The sigmoid colon was mobilized in a lateral to medial fashion along the white line of Toldt using blunt dissection and cautery.  The descending colon was soft and healthy in appearance, and was transected distal to the splenic flexure using a GIA 75 mm blue load stapler.  The mesentery of the sigmoid colon was then divided very close to the colon using LigaSure.  We stayed very close to the colon so as to avoid injury to the left ureter, which was palpated in the retroperitoneum by palpating the stent.  The sigmoid colon had been separated from the mesentery, it was divided at the rectosigmoid junction using a contour stapler.  The tissue at this point was healthy and normal in appearance.  The specimen was oriented and passed off the field and sent for routine pathology.  The surgical site was  then copiously irrigated with warm saline and appeared hemostatic.  The rectal stump was examined and the staple line was intact with no bleeding or leakage.  The staple line  was marked with a 2-0 Prolene suture.  A 19 [place] JP drain was placed in the pelvis adjacent to the rectal stump and brought out through the right lower quadrant abdominal wall.  It was secured to the skin using 2-0 nylon suture.  Small bowel was then placed back into the abdomen in proper orientation.  The distal end of the descending colon was identified and was further mobilized up to the splenic flexure to allow it to reach the abdominal wall without tension.  The marked stoma site on the left lateral abdominal wall was identified and a circular skin incision was made.  The subcutaneous tissue was divided to expose the underlying fascia.  A cruciate incision was made in the anterior rectus sheath, the muscle was spread, and the peritoneum was opened.  The resulting opening in the abdominal wall was gently stretched to accommodate 3 finger breaths.  The distal end of the descending colon was brought out through this defect taking care to keep it in its proper orientation without twisting the mesentery.  A single sheet of Seprafilm was then placed in the abdomen.  The fascia was closed at midline using a running looped 1 PDS.  The skin was left open and packed with saline-moistened Kerlix.  The colostomy was then matured in [place] fashion using 3-0 Vicryl sutures, and an ostomy appliance was placed.  Upon entering the abdomen (organ space), I encountered an abscess in the pelvis adjacent to the sigmoid colon .  CASE DATA:  Type of patient?: LDOW CASE (Surgical Hospitalist [facility] Inpatient)  Status of Case? URGENT Add On  Infection Present At Time Of Surgery (PATOS)?  ABSCESS in the pelvis    The patient tolerated the procedure with no apparent complications.  All counts were correct x2 at the end of the procedure. The patient was extubated and taken to PACU in stable condition.  Michaelle Birks, MD 06/10/21 5:37 PM

## 2021-06-10 NOTE — Progress Notes (Signed)
PROGRESS NOTE    Brittany Hobbs  PIR:[id number] DOB: 03-27-1961 DOA: 06/03/2021 PCP: Berkley Harvey, NP   Chief Complain: Abdominal pain  Brief Narrative:  Patient is a 60 year old female with history of anxiety, lupus who presented with abdominal pain.  She has recent history of total knee replacement.  Imagings of the abdomen done on presentation showed diverticulitis with pneumoperitoneum suggesting perforation.  General surgery consulted and following. She was started  on conservative management with IV antibiotics.  She was improving but again developed abdominal pain and distention.  General surgery now planning for sigmoid colectomy and end colostomy  Assessment & Plan:   Active Problems:   Acute diverticulitis   Acute diverticulitis with perforation: Started on Zosyn, continue the same for now.  General surgery following.   She was overall improving but she complained of bloating on 06/08/21 .CT-scan of the abdomen and showed acute sigmoid diverticulitis without definite evidence of perforation or abscess collection,possible distended fluid-filled small bowel loops .  Her abdomen remains distended.  General surgery now planning for sigmoid colectomy and end colostomy.  Currently n.p.o.  Leukocytosis: Mild elevation.  Patient is afebrile. Continue to monitor  Right lower extremity edema: Recent history of TKA.  Has stitches.Follow-up with orthopedics recommended as an outpatient.  Doppler study did not show DVT.  Anxiety: On anxiety medications as needed  Lupus: Resumed home medication  prednisone 10 mg daily  Hypokalemia: Being monitored and supplemented          DVT prophylaxis:Lovenox Code Status: Full Family Communication: None at bedside Status is: Inpatient  Remains inpatient appropriate because:Inpatient level of care appropriate due to severity of illness  Dispo: The patient is from: Home              Anticipated d/c is to: Home              Patient  currently is not medically stable to d/c.   Difficult to place patient No      Consultants: General surgery  Procedures:None  Antimicrobials:  Anti-infectives (From admission, onward)    Start     Dose/Rate Route Frequency Ordered Stop   06/03/21 2000  piperacillin-tazobactam (ZOSYN) IVPB 3.375 g        3.375 g 12.5 mL/hr over 240 Minutes Intravenous Every 8 hours 06/03/21 1528     06/03/21 1330  piperacillin-tazobactam (ZOSYN) IVPB 3.375 g        3.375 g 100 mL/hr over 30 Minutes Intravenous  Once 06/03/21 1315 06/03/21 1527       Subjective:  Patient seen and examined at the bedside today.  RN notified me that they found multiple substances on room including marijuana.  Surgery has been delayed.  She complains of abdominal pain, distention  Objective: Vitals:   06/09/21 0529 06/09/21 1426 06/09/21 2124 06/10/21 0559  BP: (!) 160/84 (!) 152/79 (!) 153/84 (!) 143/101  Pulse: (!) 105 79 81 (!) 103  Resp: 18 17 16 16   Temp: 99.2 F (37.3 C) 98.2 F (36.8 C) 98.3 F (36.8 C) 98 F (36.7 C)  TempSrc: Oral Oral Oral Oral  SpO2: 93% 92% 95% 96%  Weight:      Height:        Intake/Output Summary (Last 24 hours) at 06/10/2021 0754 Last data filed at 06/10/2021 6301 Gross per 24 hour  Intake 1944.86 ml  Output 200 ml  Net 1744.86 ml   Filed Weights   06/03/21 1525  Weight: 64 kg  Examination:  General exam: In moderate distress due to abdominal discomfort  HEENT: PERRL Respiratory system:  no wheezes or crackles  Cardiovascular system: S1 & S2 heard, RRR.  Gastrointestinal system: Abdomen is distended, generalized tenderness present, slow bowel sounds Central nervous system: Alert and oriented Extremities: No edema, no clubbing ,no cyanosis Skin: No rashes, no ulcers,no icterus    Data Reviewed: I have personally reviewed following labs and imaging studies  CBC: Recent Labs  Lab 06/03/21 0921 06/04/21 0435 06/06/21 0339 06/07/21 0419 06/08/21 0400  06/09/21 0423 06/10/21 0411  WBC 10.1   < > 13.6* 11.6* 15.5* 14.5* 14.0*  NEUTROABS 8.9*  --  11.5*  --   --  12.1* 11.8*  HGB 13.7   < > 12.3 11.9* 11.8* 12.0 12.1  HCT 42.3   < > 38.4 37.7 37.3 36.9 36.9  MCV 88.9   < > 88.9 89.1 88.8 85.6 86.0  PLT 357   < > 334 305 325 356 353   < > = values in this interval not displayed.   Basic Metabolic Panel: Recent Labs  Lab 06/03/21 0921 06/04/21 0435 06/06/21 0339 06/09/21 0423 06/10/21 0411  NA 143 143 140 138 141  K 4.1 4.8 3.8 3.4* 3.8  CL 109 104 103 99 100  CO2 25 28 27 30 29   GLUCOSE 107* 87 97 96 84  BUN 28* 23* 19 12 10   CREATININE 0.62 0.66 0.64 0.52 0.54  CALCIUM 9.6 9.1 8.7* 8.3* 8.7*   GFR: Estimated Creatinine Clearance: 68.1 mL/min (by C-G formula based on SCr of 0.54 mg/dL). Liver Function Tests: Recent Labs  Lab 06/03/21 0921 06/04/21 0435  AST 24 16  ALT 14 11  ALKPHOS 120 97  BILITOT 1.4* 1.0  PROT 7.2 6.6  ALBUMIN 3.8 3.1*   Recent Labs  Lab 06/03/21 0921  LIPASE 22   No results for input(s): AMMONIA in the last 168 hours. Coagulation Profile: No results for input(s): INR, PROTIME in the last 168 hours. Cardiac Enzymes: No results for input(s): CKTOTAL, CKMB, CKMBINDEX, TROPONINI in the last 168 hours. BNP (last 3 results) No results for input(s): PROBNP in the last 8760 hours. HbA1C: No results for input(s): HGBA1C in the last 72 hours. CBG: No results for input(s): GLUCAP in the last 168 hours. Lipid Profile: No results for input(s): CHOL, HDL, LDLCALC, TRIG, CHOLHDL, LDLDIRECT in the last 72 hours. Thyroid Function Tests: No results for input(s): TSH, T4TOTAL, FREET4, T3FREE, THYROIDAB in the last 72 hours. Anemia Panel: No results for input(s): VITAMINB12, FOLATE, FERRITIN, TIBC, IRON, RETICCTPCT in the last 72 hours. Sepsis Labs: No results for input(s): PROCALCITON, LATICACIDVEN in the last 168 hours.  Recent Results (from the past 240 hour(s))  Resp Panel by RT-PCR (Flu A&B,  Covid) Nasopharyngeal Swab     Status: None   Collection Time: 06/03/21  9:51 AM   Specimen: Nasopharyngeal Swab; Nasopharyngeal(NP) swabs in vial transport medium  Result Value Ref Range Status   SARS Coronavirus 2 by RT PCR NEGATIVE NEGATIVE Final    Comment: (NOTE) SARS-CoV-2 target nucleic acids are NOT DETECTED.  The SARS-CoV-2 RNA is generally detectable in upper respiratory specimens during the acute phase of infection. The lowest concentration of SARS-CoV-2 viral copies this assay can detect is 138 copies/mL. A negative result does not preclude SARS-Cov-2 infection and should not be used as the sole basis for treatment or other patient management decisions. A negative result may occur with  improper specimen collection/handling, submission of specimen  other than nasopharyngeal swab, presence of viral mutation(s) within the areas targeted by this assay, and inadequate number of viral copies(<138 copies/mL). A negative result must be combined with clinical observations, patient history, and epidemiological information. The expected result is Negative.  Fact Sheet for Patients:  EntrepreneurPulse.com.au  Fact Sheet for Healthcare Providers:  IncredibleEmployment.be  This test is no t yet approved or cleared by the [place] FDA and  has been authorized for detection and/or diagnosis of SARS-CoV-2 by FDA under an Emergency Use Authorization (EUA). This EUA will remain  in effect (meaning this test can be used) for the duration of the COVID-19 declaration under Section 564(b)(1) of the Act, 21 U.S.C.section 360bbb-3(b)(1), unless the authorization is terminated  or revoked sooner.       Influenza A by PCR NEGATIVE NEGATIVE Final   Influenza B by PCR NEGATIVE NEGATIVE Final    Comment: (NOTE) The Xpert Xpress SARS-CoV-2/FLU/RSV plus assay is intended as an aid in the diagnosis of influenza from Nasopharyngeal swab specimens and should  not be used as a sole basis for treatment. Nasal washings and aspirates are unacceptable for Xpert Xpress SARS-CoV-2/FLU/RSV testing.  Fact Sheet for Patients: EntrepreneurPulse.com.au  Fact Sheet for Healthcare Providers: IncredibleEmployment.be  This test is not yet approved or cleared by the [place] FDA and has been authorized for detection and/or diagnosis of SARS-CoV-2 by FDA under an Emergency Use Authorization (EUA). This EUA will remain in effect (meaning this test can be used) for the duration of the COVID-19 declaration under Section 564(b)(1) of the Act, 21 U.S.C. section 360bbb-3(b)(1), unless the authorization is terminated or revoked.  Performed at [facility], [place] [address] [place], [place] [address]   Surgical pcr screen     Status: None   Collection Time: 06/09/21  2:12 PM   Specimen: Nasal Mucosa; Nasal Swab  Result Value Ref Range Status   MRSA, PCR NEGATIVE NEGATIVE Final   Staphylococcus aureus NEGATIVE NEGATIVE Final    Comment: (NOTE) The Xpert SA Assay (FDA approved for NASAL specimens in patients 50 years of age and older), is one component of a comprehensive surveillance program. It is not intended to diagnose infection nor to guide or monitor treatment. Performed at [facility], [place] [address] [place], [place] [address]          Radiology Studies: CT ABDOMEN PELVIS WO CONTRAST  Result Date: 06/08/2021 CLINICAL DATA:  Lower abdominal pain, history of diverticulitis, question complication EXAM: CT ABDOMEN AND PELVIS WITHOUT CONTRAST TECHNIQUE: Multidetector CT imaging of the abdomen and pelvis was performed following the standard protocol without IV contrast. Sagittal and coronal MPR images reconstructed from axial data set. No oral contrast administered. COMPARISON:  06/03/2021 FINDINGS: Lower chest: Small RIGHT pleural effusion and subsegmental atelectasis RIGHT  lower lobe. Peripheral chronic interstitial lung disease changes at both lung bases. Hepatobiliary: Multiple calcified gallstones within a mildly distended gallbladder. Liver unremarkable. Pancreas: Normal appearance Spleen: Normal appearance Adrenals/Urinary Tract: Adrenal glands, kidneys, ureters, and bladder normal appearance Stomach/Bowel: Normal appendix. Stomach normal appearance. Some of the small bowel loops are dilated favoring ileus. Normal appearance of colon from cecum through splenic flexure. Diverticulosis of descending and sigmoid colon with again identified wall thickening and pericolic inflammatory changes at the sigmoid colon consistent with acute diverticulitis. Fluid collections in the central pelvis may represent fluid-filled bowel loops though due to lack of IV and oral contrast, it is difficult to completely exclude interloop collections. Vascular/Lymphatic: Atherosclerotic calcifications of aorta  and iliac arteries. Aorta normal caliber. No adenopathy. Reproductive: Unremarkable uterus.  Ovaries obscured. Other: Small amount of free fluid perihepatic, LEFT gutter, and suspect interloop in the LEFT mid abdomen. No hernia. No free intraperitoneal air. Musculoskeletal: Superior endplate compression deformities of T12, L2, L3, and L4. Degenerative disc disease changes L5-S1. IMPRESSION: Acute sigmoid diverticulitis without definite evidence of perforation or abscess collection. Fluid collections in the central pelvis may represent distended fluid-filled small bowel loops though it is difficult to completely exclude interloop collections/abscess; may consider follow-up CT imaging with IV contrast to better assess, if patient's renal function permits. Small amount of nonspecific free intraperitoneal fluid. Mild dilatation of small bowel loops question ileus with some of the small bowel loops in the LEFT abdomen showing mild wall thickening, which could reflect enteritis or secondary peritonitis.  Cholelithiasis. Small RIGHT pleural effusion and subsegmental atelectasis RIGHT lower lobe. Chronic interstitial lung disease changes at both lung bases. Aortic Atherosclerosis (ICD10-I70.0). Electronically Signed   By: Lavonia Dana M.D.   On: 06/08/2021 16:27        Scheduled Meds:  acetaminophen  1,000 mg Oral Q6H   enoxaparin (LOVENOX) injection  40 mg Subcutaneous Q24H   escitalopram  20 mg Oral Daily   gabapentin  800 mg Oral TID   LORazepam  0.5 mg Oral q morning   LORazepam  1 mg Oral QPM   metoprolol tartrate  12.5 mg Oral Daily   predniSONE  10 mg Oral Q breakfast   Continuous Infusions:  lactated ringers 75 mL/hr at 06/09/21 2154   piperacillin-tazobactam (ZOSYN)  IV 3.375 g (06/10/21 0424)     LOS: 7 days    Time spent: 25 mins.More than 50% of that time was spent in counseling and/or coordination of care.      Shelly Coss, MD [facility] P9/23/2022, 7:54 AM

## 2021-06-10 NOTE — Anesthesia Procedure Notes (Signed)
Procedure Name: Intubation Date/Time: 06/10/2021 3:36 PM Performed by: Lissa Morales, CRNA Pre-anesthesia Checklist: Patient identified, Emergency Drugs available, Suction available and Patient being monitored Patient Re-evaluated:Patient Re-evaluated prior to induction Oxygen Delivery Method: Circle system utilized Preoxygenation: Pre-oxygenation with 100% oxygen Induction Type: IV induction Ventilation: Mask ventilation without difficulty Laryngoscope Size: Mac and 4 Grade View: Grade II Tube type: Oral Tube size: 7.0 mm Number of attempts: 1 Airway Equipment and Method: Stylet and Oral airway Placement Confirmation: ETT inserted through vocal cords under direct vision, positive ETCO2 and breath sounds checked- equal and bilateral Secured at: 21 cm Tube secured with: Tape Dental Injury: Teeth and Oropharynx as per pre-operative assessment

## 2021-06-10 NOTE — Op Note (Signed)
Operative Note  Preoperative diagnosis:  1.  Sigmoid diverticulitis  Postoperative diagnosis: 1.  Sigmoid diverticulitis  Procedure(s): 1.  Cystoscopy 2. LEFT open ended externalized ureteral stent placement  Surgeon: Rexene Alberts, MD  Assistants:  None  Anesthesia:  General  Complications:  None  EBL:  Minimal for my portion  Specimens: 1. none  Drains/Catheters: 1.  Left 5 French externalized ureteral stent  Intraoperative findings:   Cystoscopy demonstrated no suspicious lesions, masses, stones or other pathology. Successful left externalized open ended ureteral stent placement   Indication:  Brittany Hobbs is a 60 y.o. female with sigmoid diverticulitis. General surgery requested left externalized ureteral stent to aid in identification of ureter intraoperatively. After reviewing the management options for treatment, she elected to proceed with the above surgical procedure(s). We have discussed the potential benefits and risks of the procedure, side effects of the proposed treatment, the likelihood of the patient achieving the goals of the procedure, and any potential problems that might occur during the procedure or recuperation. Informed consent has been obtained.  Description of procedure: The patient was taken to the operating room and general anesthesia was induced.  The patient was placed in the dorsal lithotomy position, prepped and draped in the usual sterile fashion, and preoperative antibiotics were administered. A preoperative time-out was performed.   Cystourethroscopy was performed.  The patient's urethra was examined and was normal. The bladder was then systematically examined in its entirety. There was no evidence for any bladder tumors, stones, or other mucosal pathology.    Attention then turned to the left ureteral orifice. A 0.038 zip wire was passed through the left orifice and over the wire a 5 Fr open ended catheter was inserted and passed up to  the level of the renal pelvis to the level of 25cm. The wire was then removed. The left ureteral stent was tied to a 16 Fr foley catheter. The stent and catheter were connected to a [employer].   The bladder was then emptied and the procedure ended.  The patient appeared to tolerate the procedure well and without complications.  The patient was turned over to Dr. Bess Harvest team for her portion of the case  Plan:  Ok to remove externalized ureteral stent at the end of the case per the general surgery team.  [facility]  Pager: [phone]

## 2021-06-10 NOTE — Progress Notes (Signed)
Progress Note  Day of Surgery  Subjective: Earlier this morning per RN report patient was very agitated and upset claims she did not want an ostomy.  No medications were reportedly found in her bed at the time.  Surgery was delayed and I had a discussion with the patient at bedside this afternoon.  She is at her baseline mental status and she is agreeable to proceeding with surgery.  She says that she is still in a lot of abdominal pain and having bloating and she would like to go ahead with surgery and stated that she understands this will be in a colostomy and that she would like to proceed.  Objective: Vital signs in last 24 hours: Temp:  [98 F (36.7 C)-99.9 F (37.7 C)] 99.9 F (37.7 C) (09/23 0840) Pulse Rate:  [79-107] 107 (09/23 0840) Resp:  [16-18] 18 (09/23 0840) BP: (143-156)/(79-101) 156/80 (09/23 0840) SpO2:  [92 %-96 %] 92 % (09/23 0840) Last BM Date: 06/09/21  Intake/Output from previous day: 09/22 0701 - 09/23 0700 In: 1944.9 [I.V.:1476.7; IV Piggyback:468.2] Out: 200 [Urine:200] Intake/Output this shift: No intake/output data recorded.  PE: General: pleasant, oriented, no acute distress Heart: regular, rate, and rhythm.   Lungs: Work of breathing on room air Abd: soft, distended, tender to palpation in the left lower quadrant. Psych: A&Ox3 with an appropriate affect.    Lab Results:  Recent Labs    06/09/21 0423 06/10/21 0411  WBC 14.5* 14.0*  HGB 12.0 12.1  HCT 36.9 36.9  PLT 356 353   BMET Recent Labs    06/09/21 0423 06/10/21 0411  NA 138 141  K 3.4* 3.8  CL 99 100  CO2 30 29  GLUCOSE 96 84  BUN 12 10  CREATININE 0.52 0.54  CALCIUM 8.3* 8.7*   PT/INR No results for input(s): LABPROT, INR in the last 72 hours. CMP     Component Value Date/Time   NA 141 06/10/2021 0411   K 3.8 06/10/2021 0411   CL 100 06/10/2021 0411   CO2 29 06/10/2021 0411   GLUCOSE 84 06/10/2021 0411   BUN 10 06/10/2021 0411   CREATININE 0.54 06/10/2021 0411    CALCIUM 8.7 (L) 06/10/2021 0411   PROT 6.6 06/04/2021 0435   ALBUMIN 3.1 (L) 06/04/2021 0435   AST 16 06/04/2021 0435   ALT 11 06/04/2021 0435   ALKPHOS 97 06/04/2021 0435   BILITOT 1.0 06/04/2021 0435   GFRNONAA >60 06/10/2021 0411   GFRAA >60 10/02/2019 0445   Lipase     Component Value Date/Time   LIPASE 22 06/03/2021 0921       Studies/Results: CT ABDOMEN PELVIS WO CONTRAST  Result Date: 06/08/2021 CLINICAL DATA:  Lower abdominal pain, history of diverticulitis, question complication EXAM: CT ABDOMEN AND PELVIS WITHOUT CONTRAST TECHNIQUE: Multidetector CT imaging of the abdomen and pelvis was performed following the standard protocol without IV contrast. Sagittal and coronal MPR images reconstructed from axial data set. No oral contrast administered. COMPARISON:  06/03/2021 FINDINGS: Lower chest: Small RIGHT pleural effusion and subsegmental atelectasis RIGHT lower lobe. Peripheral chronic interstitial lung disease changes at both lung bases. Hepatobiliary: Multiple calcified gallstones within a mildly distended gallbladder. Liver unremarkable. Pancreas: Normal appearance Spleen: Normal appearance Adrenals/Urinary Tract: Adrenal glands, kidneys, ureters, and bladder normal appearance Stomach/Bowel: Normal appendix. Stomach normal appearance. Some of the small bowel loops are dilated favoring ileus. Normal appearance of colon from cecum through splenic flexure. Diverticulosis of descending and sigmoid colon with again identified wall thickening  and pericolic inflammatory changes at the sigmoid colon consistent with acute diverticulitis. Fluid collections in the central pelvis may represent fluid-filled bowel loops though due to lack of IV and oral contrast, it is difficult to completely exclude interloop collections. Vascular/Lymphatic: Atherosclerotic calcifications of aorta and iliac arteries. Aorta normal caliber. No adenopathy. Reproductive: Unremarkable uterus.  Ovaries obscured.  Other: Small amount of free fluid perihepatic, LEFT gutter, and suspect interloop in the LEFT mid abdomen. No hernia. No free intraperitoneal air. Musculoskeletal: Superior endplate compression deformities of T12, L2, L3, and L4. Degenerative disc disease changes L5-S1. IMPRESSION: Acute sigmoid diverticulitis without definite evidence of perforation or abscess collection. Fluid collections in the central pelvis may represent distended fluid-filled small bowel loops though it is difficult to completely exclude interloop collections/abscess; may consider follow-up CT imaging with IV contrast to better assess, if patient's renal function permits. Small amount of nonspecific free intraperitoneal fluid. Mild dilatation of small bowel loops question ileus with some of the small bowel loops in the LEFT abdomen showing mild wall thickening, which could reflect enteritis or secondary peritonitis. Cholelithiasis. Small RIGHT pleural effusion and subsegmental atelectasis RIGHT lower lobe. Chronic interstitial lung disease changes at both lung bases. Aortic Atherosclerosis (ICD10-I70.0). Electronically Signed   By: Lavonia Dana M.D.   On: 06/08/2021 16:27    Anti-infectives: Anti-infectives (From admission, onward)    Start     Dose/Rate Route Frequency Ordered Stop   06/03/21 2000  piperacillin-tazobactam (ZOSYN) IVPB 3.375 g        3.375 g 12.5 mL/hr over 240 Minutes Intravenous Every 8 hours 06/03/21 1528     06/03/21 1330  piperacillin-tazobactam (ZOSYN) IVPB 3.375 g        3.375 g 100 mL/hr over 30 Minutes Intravenous  Once 06/03/21 1315 06/03/21 1527        Assessment/Plan Diverticulitis with contained perforation Patient is failed to improve with a week of IV antibiotics.  She is still distended with abdominal pain and tenderness.  White count remains elevated at 14.  She had an episode of agitation this morning.  Is not back at her baseline mental status and seems to have decision-making capacity.   She clearly stated to me that she that she wants to proceed with surgery and she understands this will mean a colostomy.  Urine drug screen was negative for amphetamines and cocaine so I think it is safe to proceed with surgery today.  We will proceed with an open sigmoid colectomy.  Will also discuss with urology to see if there is still availability to place a left ureteral stent prior to surgery.   FEN: NPO/ IVF  VTE: LMWH ID: Zosyn 9/16>>   Arthritis Lupus HLD Panic disorder   LOS: 7 days    Dwan Bolt, MD [facility] 06/10/2021, 12:18 PM Please see Amion for pager number during day hours 7:00am-4:30pm

## 2021-06-10 NOTE — Transfer of Care (Signed)
Immediate Anesthesia Transfer of Care Note  Patient: Brittany Hobbs  Procedure(s) Performed: OPEN SIGMOID COLON RESECTION WITH END COLOSTOMY AND ABDOMINAL WASHOUT (Abdomen) CYSTOSCOPY WITH LEFT STENT PLACEMENT (Ureter)  Patient Location: PACU  Anesthesia Type:General  Level of Consciousness: awake, alert , oriented and patient cooperative  Airway & Oxygen Therapy: Patient Spontanous Breathing and Patient connected to face mask oxygen  Post-op Assessment: Report given to RN, Post -op Vital signs reviewed and stable and Patient moving all extremities X 4  Post vital signs: stable  Last Vitals:  Vitals Value Taken Time  BP 180/90 06/10/21 1737  Temp    Pulse 84 06/10/21 1745  Resp 31 06/10/21 1745  SpO2 100 % 06/10/21 1745  Vitals shown include unvalidated device data.  Last Pain:  Vitals:   06/10/21 1340  TempSrc: Oral  PainSc:       Patients Stated Pain Goal: 0 (40/81/44 8185)  Complications: No notable events documented.

## 2021-06-10 NOTE — Progress Notes (Signed)
   06/10/21 1100  Mobility  Activity Contraindicated/medical hold   Per nurse, pt not appropriate for mobility at this time.    [place] Specialist Acute Rehab Services Office: [phone]

## 2021-06-10 NOTE — Progress Notes (Signed)
Day of Surgery Subjective: Pain controlled.  Objective: Vital signs in last 24 hours: Temp:  [98 F (36.7 C)-99.9 F (37.7 C)] 98.5 F (36.9 C) (09/23 1340) Pulse Rate:  [81-107] 89 (09/23 1340) Resp:  [16-18] 18 (09/23 1340) BP: (143-162)/(80-101) 162/83 (09/23 1340) SpO2:  [92 %-96 %] 94 % (09/23 1340)  Intake/Output from previous day: 09/22 0701 - 09/23 0700 In: 1944.9 [I.V.:1476.7; IV Piggyback:468.2] Out: 200 [Urine:200] Intake/Output this shift: No intake/output data recorded.  Physical Exam:  General: Alert and oriented CV: RRR Lungs: Clear Abdomen: Soft, ND Ext: NT, No erythema  Lab Results: Recent Labs    06/08/21 0400 06/09/21 0423 06/10/21 0411  HGB 11.8* 12.0 12.1  HCT 37.3 36.9 36.9   BMET Recent Labs    06/09/21 0423 06/10/21 0411  NA 138 141  K 3.4* 3.8  CL 99 100  CO2 30 29  GLUCOSE 96 84  BUN 12 10  CREATININE 0.52 0.54  CALCIUM 8.3* 8.7*     Studies/Results: CT ABDOMEN PELVIS WO CONTRAST  Result Date: 06/08/2021 CLINICAL DATA:  Lower abdominal pain, history of diverticulitis, question complication EXAM: CT ABDOMEN AND PELVIS WITHOUT CONTRAST TECHNIQUE: Multidetector CT imaging of the abdomen and pelvis was performed following the standard protocol without IV contrast. Sagittal and coronal MPR images reconstructed from axial data set. No oral contrast administered. COMPARISON:  06/03/2021 FINDINGS: Lower chest: Small RIGHT pleural effusion and subsegmental atelectasis RIGHT lower lobe. Peripheral chronic interstitial lung disease changes at both lung bases. Hepatobiliary: Multiple calcified gallstones within a mildly distended gallbladder. Liver unremarkable. Pancreas: Normal appearance Spleen: Normal appearance Adrenals/Urinary Tract: Adrenal glands, kidneys, ureters, and bladder normal appearance Stomach/Bowel: Normal appendix. Stomach normal appearance. Some of the small bowel loops are dilated favoring ileus. Normal appearance of colon  from cecum through splenic flexure. Diverticulosis of descending and sigmoid colon with again identified wall thickening and pericolic inflammatory changes at the sigmoid colon consistent with acute diverticulitis. Fluid collections in the central pelvis may represent fluid-filled bowel loops though due to lack of IV and oral contrast, it is difficult to completely exclude interloop collections. Vascular/Lymphatic: Atherosclerotic calcifications of aorta and iliac arteries. Aorta normal caliber. No adenopathy. Reproductive: Unremarkable uterus.  Ovaries obscured. Other: Small amount of free fluid perihepatic, LEFT gutter, and suspect interloop in the LEFT mid abdomen. No hernia. No free intraperitoneal air. Musculoskeletal: Superior endplate compression deformities of T12, L2, L3, and L4. Degenerative disc disease changes L5-S1. IMPRESSION: Acute sigmoid diverticulitis without definite evidence of perforation or abscess collection. Fluid collections in the central pelvis may represent distended fluid-filled small bowel loops though it is difficult to completely exclude interloop collections/abscess; may consider follow-up CT imaging with IV contrast to better assess, if patient's renal function permits. Small amount of nonspecific free intraperitoneal fluid. Mild dilatation of small bowel loops question ileus with some of the small bowel loops in the LEFT abdomen showing mild wall thickening, which could reflect enteritis or secondary peritonitis. Cholelithiasis. Small RIGHT pleural effusion and subsegmental atelectasis RIGHT lower lobe. Chronic interstitial lung disease changes at both lung bases. Aortic Atherosclerosis (ICD10-I70.0). Electronically Signed   By: Lavonia Dana M.D.   On: 06/08/2021 16:27    Assessment/Plan: Sigmoid diverticulitis-scheduled for abdominal exploration probable colostomy today  -Will plan for LEFT open ended catheter placement  -The risks, benefits and alternatives of cystoscopy  with Left open ended catheter placement was discussed with the patient.  Risks include, but are not limited to: bleeding, urinary tract infection, ureteral injury, ureteral  stricture disease, chronic pain, urinary symptoms, bladder injury, stent migration, the need for nephrostomy tube placement, MI, CVA, DVT, PE and the inherent risks with general anesthesia.  The patient voices understanding and wishes to proceed.     LOS: 7 days   Matt R. Tabathia Knoche MD 06/10/2021, 2:29 PM [facility]  Pager: [phone]

## 2021-06-10 NOTE — Progress Notes (Signed)
Responded to consult for IV. RN reports pt will be of the floor shortly and consult no longer needed.

## 2021-06-11 ENCOUNTER — Inpatient Hospital Stay (HOSPITAL_COMMUNITY): Payer: BC Managed Care – PPO

## 2021-06-11 DIAGNOSIS — K5792 Diverticulitis of intestine, part unspecified, without perforation or abscess without bleeding: Secondary | ICD-10-CM | POA: Diagnosis not present

## 2021-06-11 LAB — BASIC METABOLIC PANEL
Anion gap: 13 (ref 5–15)
BUN: 12 mg/dL (ref 6–20)
CO2: 28 mmol/L (ref 22–32)
Calcium: 8.2 mg/dL — ABNORMAL LOW (ref 8.9–10.3)
Chloride: 96 mmol/L — ABNORMAL LOW (ref 98–111)
Creatinine, Ser: 0.49 mg/dL (ref 0.44–1.00)
GFR, Estimated: >60 mL/min (ref >60)
Glucose, Bld: 132 mg/dL — ABNORMAL HIGH (ref 70–99)
Potassium: 4.7 mmol/L (ref 3.5–5.1)
Sodium: 137 mmol/L (ref 135–145)

## 2021-06-11 LAB — CBC WITH DIFFERENTIAL/PLATELET
Abs Immature Granulocytes: 0.07 10*3/uL (ref 0.00–0.07)
Basophils Absolute: 0 10*3/uL (ref 0.0–0.1)
Basophils Relative: 0 %
Eosinophils Absolute: 0 10*3/uL (ref 0.0–0.5)
Eosinophils Relative: 0 %
HCT: 41.2 % (ref 36.0–46.0)
Hemoglobin: 13.1 g/dL (ref 12.0–15.0)
Immature Granulocytes: 1 %
Lymphocytes Relative: 8 %
Lymphs Abs: 0.9 10*3/uL (ref 0.7–4.0)
MCH: 27.6 pg (ref 26.0–34.0)
MCHC: 31.8 g/dL (ref 30.0–36.0)
MCV: 86.9 fL (ref 80.0–100.0)
Monocytes Absolute: 0.5 10*3/uL (ref 0.1–1.0)
Monocytes Relative: 5 %
Neutro Abs: 10.3 10*3/uL — ABNORMAL HIGH (ref 1.7–7.7)
Neutrophils Relative %: 86 %
Platelets: 444 10*3/uL — ABNORMAL HIGH (ref 150–400)
RBC: 4.74 MIL/uL (ref 3.87–5.11)
RDW: 16 % — ABNORMAL HIGH (ref 11.5–15.5)
WBC: 11.8 10*3/uL — ABNORMAL HIGH (ref 4.0–10.5)
nRBC: 0 % (ref 0.0–0.2)

## 2021-06-11 IMAGING — DX DG ABDOMEN 1V
1 series · 1 of 1 positions shown · non-contrast
Comparison: CT [DATE]

CLINICAL DATA: NG tube

EXAM:
ABDOMEN - 1 VIEW

[abdomen kub]
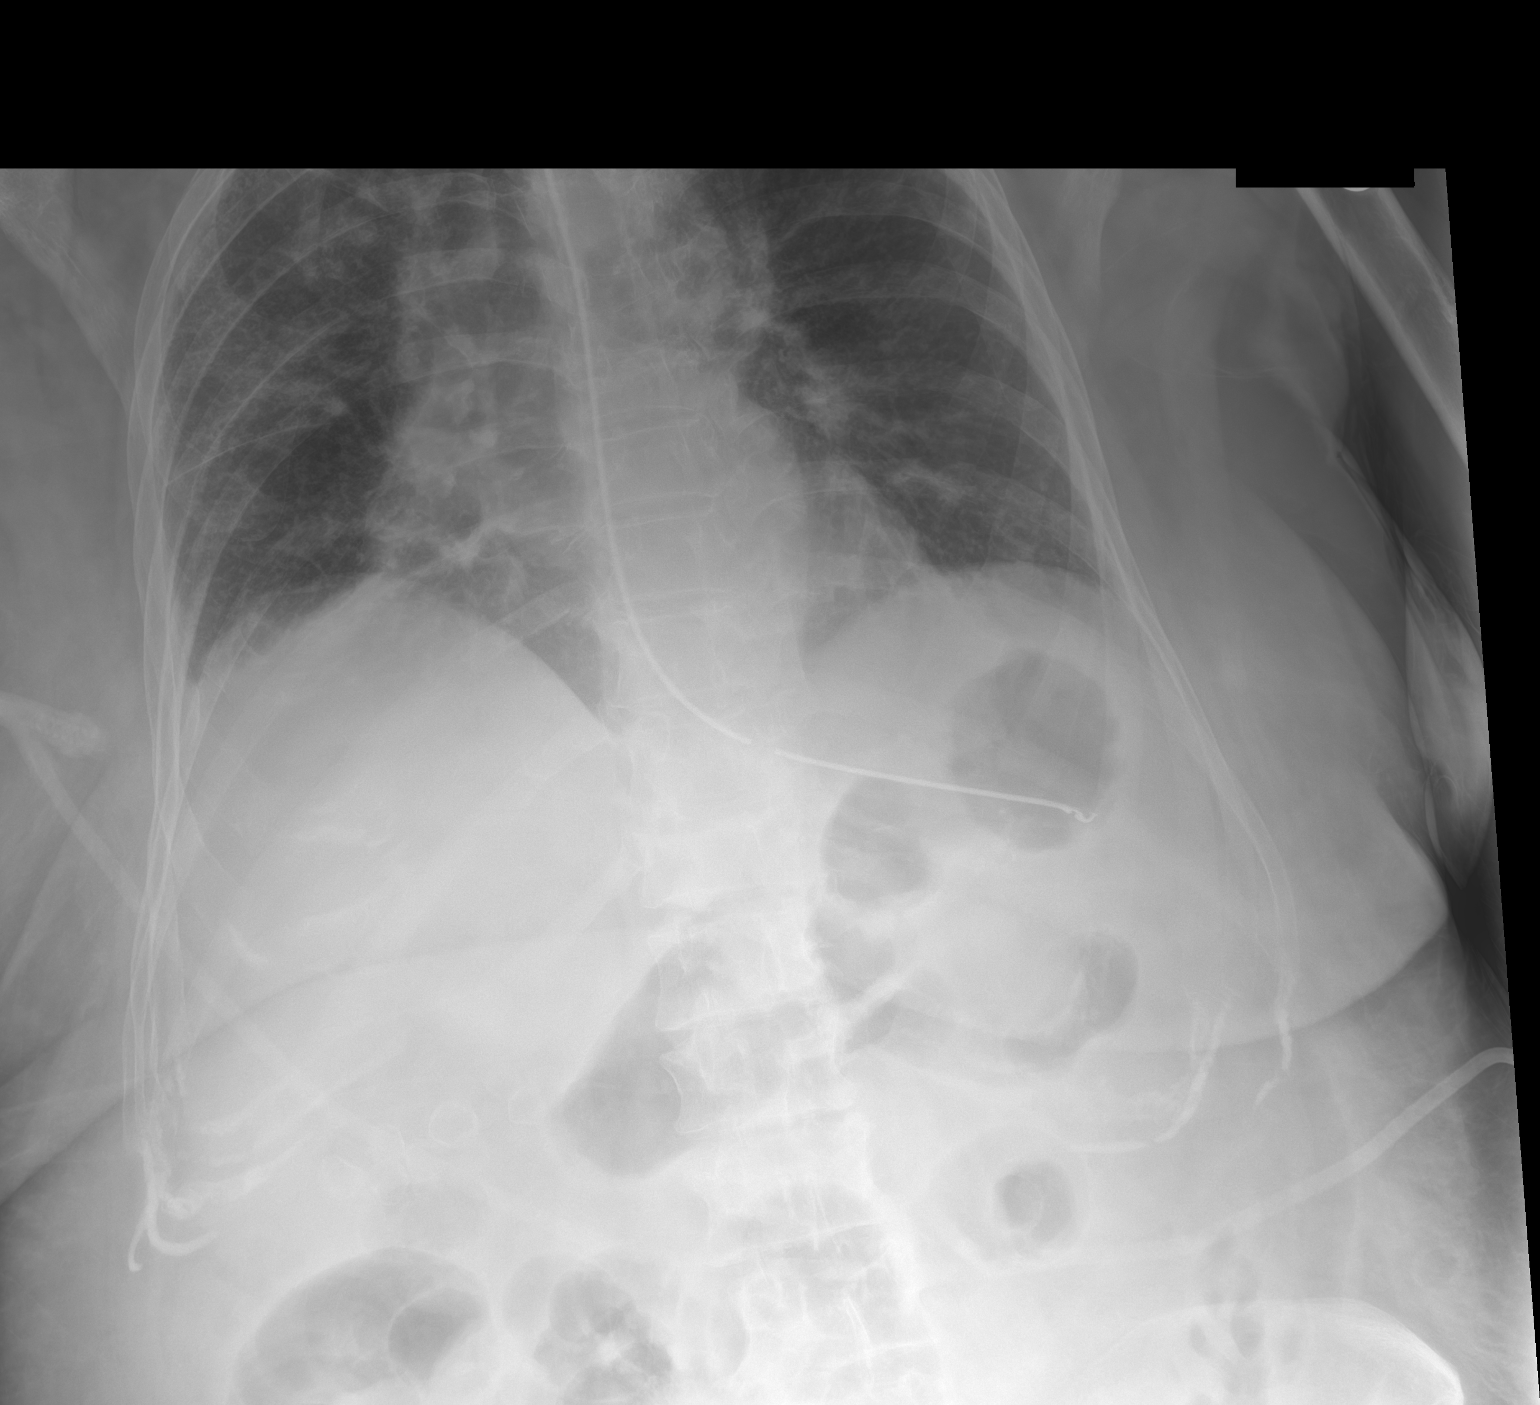

[1 of 1 positions shown; findings below may reference images not displayed]

FINDINGS: Esophageal tube tip overlies the proximal stomach, side-port in the
region of GE junction. Further advancement could be considered for
more optimal position. Mild air distension of bowel in the abdomen
IMPRESSION: Esophageal tube side-port in the region of GE junction, further
advancement could be considered for more optimal positioning

## 2021-06-11 NOTE — Progress Notes (Signed)
1 Day Post-Op   Subjective/Chief Complaint: Complains of soreness but otherwise seems ok   Objective: Vital signs in last 24 hours: Temp:  [97.5 F (36.4 C)-98.5 F (36.9 C)] 97.7 F (36.5 C) (09/24 0928) Pulse Rate:  [67-89] 67 (09/24 0928) Resp:  [11-21] 15 (09/24 0745) BP: (109-177)/(60-112) 125/60 (09/24 0928) SpO2:  [93 %-100 %] 98 % (09/24 0928) FiO2 (%):  [97 %] 97 % (09/24 0742) Last BM Date: 06/09/21  Intake/Output from previous day: 09/23 0701 - 09/24 0700 In: 3166.4 [I.V.:2852.6; IV Piggyback:313.8] Out: 2685 [Urine:1150; Emesis/NG output:340; Drains:995; Blood:200] Intake/Output this shift: No intake/output data recorded.  General appearance: alert and cooperative Resp: clear to auscultation bilaterally Cardio: regular rate and rhythm GI: soft, appropriately tender. Ostomy pink with no output  Lab Results:  Recent Labs    06/10/21 0411 06/11/21 0814  WBC 14.0* 11.8*  HGB 12.1 13.1  HCT 36.9 41.2  PLT 353 444*   BMET Recent Labs    06/10/21 0411 06/11/21 0814  NA 141 137  K 3.8 4.7  CL 100 96*  CO2 29 28  GLUCOSE 84 132*  BUN 10 12  CREATININE 0.54 0.49  CALCIUM 8.7* 8.2*   PT/INR No results for input(s): LABPROT, INR in the last 72 hours. ABG No results for input(s): PHART, HCO3 in the last 72 hours.  Invalid input(s): PCO2, PO2  Studies/Results: No results found.  Anti-infectives: Anti-infectives (From admission, onward)    Start     Dose/Rate Route Frequency Ordered Stop   06/03/21 2000  piperacillin-tazobactam (ZOSYN) IVPB 3.375 g        3.375 g 12.5 mL/hr over 240 Minutes Intravenous Every 8 hours 06/03/21 1528     06/03/21 1330  piperacillin-tazobactam (ZOSYN) IVPB 3.375 g        3.375 g 100 mL/hr over 30 Minutes Intravenous  Once 06/03/21 1315 06/03/21 1527       Assessment/Plan: s/p Procedure(s): OPEN SIGMOID COLON RESECTION WITH END COLOSTOMY AND ABDOMINAL WASHOUT (N/A) CYSTOSCOPY WITH LEFT STENT PLACEMENT Continue  ng and bowel rest Continue IV zosyn for perforated diverticulitis Start dressing changes today ambulate  LOS: 8 days    [name] Messing III 06/11/2021

## 2021-06-11 NOTE — Progress Notes (Signed)
PROGRESS NOTE    Brittany Herandez  UXN:[id number] DOB: 07-17-1961 DOA: 06/03/2021 PCP: Berkley Harvey, NP   Chief Complain: Abdominal pain  Brief Narrative:  Patient is a 60 year old female with history of anxiety, lupus who presented with abdominal pain.  She has recent history of total knee replacement.  Imagings of the abdomen done on presentation showed diverticulitis with pneumoperitoneum suggesting perforation.  General surgery consulted and following. She was started  on conservative management with IV antibiotics.  She was improving but again developed abdominal pain and distention. 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  S/P  sigmoid colectomy ,end colostomy, placement of intra-abdominal drain on 9.23.22.  Currently n.p.o.On zosyn On Dilaudid PCA  Leukocytosis: Mild elevation.  Patient is afebrile. Continue to monitor  Right lower extremity edema: Recent history of TKA.  Has stitches.Follow-up with orthopedics recommended as an outpatient.  Doppler study did not show DVT.  Anxiety: On anxiety medications as needed  Lupus: Resumed home medication  prednisone 10 mg daily  Hypokalemia: Being monitored           DVT prophylaxis:Lovenox Code Status: Full Family Communication: None at bedside Status is: Inpatient  Remains inpatient appropriate because:Inpatient level of care appropriate due to severity of illness  Dispo: The patient is from: Home              Anticipated d/c is to: Home              Patient currently is not medically stable to d/c.   Difficult to place patient No      Consultants:  General surgery  Procedures:None  Antimicrobials:  Anti-infectives (From admission, onward)    Start     Dose/Rate Route Frequency Ordered Stop   06/03/21 2000  piperacillin-tazobactam (ZOSYN) IVPB 3.375 g        3.375 g 12.5 mL/hr over 240 Minutes Intravenous Every 8 hours 06/03/21 1528     06/03/21 1330  piperacillin-tazobactam (ZOSYN) IVPB 3.375 g        3.375 g 100 mL/hr over 30 Minutes Intravenous  Once 06/03/21 1315 06/03/21 1527       Subjective: Patient seen and examined at the bedside this morning.  Overall looks comfortable.  Complains of abdominal pain.  Objective: Vitals:   06/11/21 0131 06/11/21 0301 06/11/21 0514 06/11/21 0742  BP: (!) 109/94  (!) 169/84   Pulse: 73  84   Resp: 15 11 12 14   Temp: (!) 97.5 F (36.4 C)  97.6 F (36.4 C)   TempSrc: Oral  Oral   SpO2: 97% 96% 97% 94%  Weight:      Height:        Intake/Output Summary (Last 24 hours) at 06/11/2021 0748 Last data filed at 06/11/2021 0600 Gross per 24 hour  Intake 3166.36 ml  Output 2685 ml  Net 481.36 ml   Filed Weights   06/03/21 1525  Weight: 64 kg    Examination:  General exam: Overall comfortable, not in distress HEENT: PERRL Respiratory system:  no wheezes or crackles  Cardiovascular system: S1 & S2 heard, RRR.  Gastrointestinal system: Surgical wound, empty colostomy bag, appropriately tender Central nervous system: Alert and  oriented Extremities: No edema, no clubbing ,no cyanosis Skin: No rashes, no ulcers,no icterus     Data Reviewed: I have personally reviewed following labs and imaging studies  CBC: Recent Labs  Lab 06/06/21 0339 06/07/21 0419 06/08/21 0400 06/09/21 0423 06/10/21 0411  WBC 13.6* 11.6* 15.5* 14.5* 14.0*  NEUTROABS 11.5*  --   --  12.1* 11.8*  HGB 12.3 11.9* 11.8* 12.0 12.1  HCT 38.4 37.7 37.3 36.9 36.9  MCV 88.9 89.1 88.8 85.6 86.0  PLT 334 305 325 356 379   Basic Metabolic Panel: Recent Labs  Lab 06/06/21 0339 06/09/21 0423  06/10/21 0411  NA 140 138 141  K 3.8 3.4* 3.8  CL 103 99 100  CO2 27 30 29   GLUCOSE 97 96 84  BUN 19 12 10   CREATININE 0.64 0.52 0.54  CALCIUM 8.7* 8.3* 8.7*   GFR: Estimated Creatinine Clearance: 68.1 mL/min (by C-G formula based on SCr of 0.54 mg/dL). Liver Function Tests: No results for input(s): AST, ALT, ALKPHOS, BILITOT, PROT, ALBUMIN in the last 168 hours.  No results for input(s): LIPASE, AMYLASE in the last 168 hours.  No results for input(s): AMMONIA in the last 168 hours. Coagulation Profile: No results for input(s): INR, PROTIME in the last 168 hours. Cardiac Enzymes: No results for input(s): CKTOTAL, CKMB, CKMBINDEX, TROPONINI in the last 168 hours. BNP (last 3 results) No results for input(s): PROBNP in the last 8760 hours. HbA1C: No results for input(s): HGBA1C in the last 72 hours. CBG: No results for input(s): GLUCAP in the last 168 hours. Lipid Profile: No results for input(s): CHOL, HDL, LDLCALC, TRIG, CHOLHDL, LDLDIRECT in the last 72 hours. Thyroid Function Tests: No results for input(s): TSH, T4TOTAL, FREET4, T3FREE, THYROIDAB in the last 72 hours. Anemia Panel: No results for input(s): VITAMINB12, FOLATE, FERRITIN, TIBC, IRON, RETICCTPCT in the last 72 hours. Sepsis Labs: No results for input(s): PROCALCITON, LATICACIDVEN in the last 168 hours.  Recent Results (from the past 240 hour(s))  Resp Panel by RT-PCR (Flu A&B, Covid) Nasopharyngeal Swab     Status: None   Collection Time: 06/03/21  9:51 AM   Specimen: Nasopharyngeal Swab; Nasopharyngeal(NP) swabs in vial transport medium  Result Value Ref Range Status   SARS Coronavirus 2 by RT PCR NEGATIVE NEGATIVE Final    Comment: (NOTE) SARS-CoV-2 target nucleic acids are NOT DETECTED.  The SARS-CoV-2 RNA is generally detectable in upper respiratory specimens during the acute phase of infection. The lowest concentration of SARS-CoV-2 viral copies this assay can detect is 138 copies/mL. A negative  result does not preclude SARS-Cov-2 infection and should not be used as the sole basis for treatment or other patient management decisions. A negative result may occur with  improper specimen collection/handling, submission of specimen other than nasopharyngeal swab, presence of viral mutation(s) within the areas targeted by this assay, and inadequate number of viral copies(<138 copies/mL). A negative result must be combined with clinical observations, patient history, and epidemiological information. The expected result is Negative.  Fact Sheet for Patients:  EntrepreneurPulse.com.au  Fact Sheet for Healthcare Providers:  IncredibleEmployment.be  This test is no t yet approved or cleared by the [place] FDA and  has been authorized for detection and/or diagnosis of SARS-CoV-2 by FDA under an Emergency Use Authorization (EUA). This EUA will remain  in effect (meaning this test can be used) for the duration of the COVID-19 declaration under Section 564(b)(1) of the Act, 21 U.S.C.section 360bbb-3(b)(1), unless the authorization is terminated  or  revoked sooner.       Influenza A by PCR NEGATIVE NEGATIVE Final   Influenza B by PCR NEGATIVE NEGATIVE Final    Comment: (NOTE) The Xpert Xpress SARS-CoV-2/FLU/RSV plus assay is intended as an aid in the diagnosis of influenza from Nasopharyngeal swab specimens and should not be used as a sole basis for treatment. Nasal washings and aspirates are unacceptable for Xpert Xpress SARS-CoV-2/FLU/RSV testing.  Fact Sheet for Patients: EntrepreneurPulse.com.au  Fact Sheet for Healthcare Providers: IncredibleEmployment.be  This test is not yet approved or cleared by the [place] FDA and has been authorized for detection and/or diagnosis of SARS-CoV-2 by FDA under an Emergency Use Authorization (EUA). This EUA will remain in effect (meaning this test can be used)  for the duration of the COVID-19 declaration under Section 564(b)(1) of the Act, 21 U.S.C. section 360bbb-3(b)(1), unless the authorization is terminated or revoked.  Performed at [facility], [place] [address] [place], [place] [address]   Surgical pcr screen     Status: None   Collection Time: 06/09/21  2:12 PM   Specimen: Nasal Mucosa; Nasal Swab  Result Value Ref Range Status   MRSA, PCR NEGATIVE NEGATIVE Final   Staphylococcus aureus NEGATIVE NEGATIVE Final    Comment: (NOTE) The Xpert SA Assay (FDA approved for NASAL specimens in patients 41 years of age and older), is one component of a comprehensive surveillance program. It is not intended to diagnose infection nor to guide or monitor treatment. Performed at [facility], [place] [address], [place], [place] [address]   Aerobic/Anaerobic Culture w Gram Stain (surgical/deep wound)     Status: None (Preliminary result)   Collection Time: 06/10/21  4:07 PM   Specimen: PATH Other; Tissue  Result Value Ref Range Status   Specimen Description   Final    ABSCESS PERITONEAL Performed at [facility] [address] [place], [place] [address]    Special Requests   Final    NONE Performed at [facility], [place] [address], [place], [place] [address]    Gram Stain   Final    FEW SQUAMOUS EPITHELIAL CELLS PRESENT FEW WBC SEEN FEW GRAM NEGATIVE RODS Performed at [facility], [place] [address] [place], [place] [address]    Culture PENDING  Incomplete   Report Status PENDING  Incomplete         Radiology Studies: No results found.      Scheduled Meds:  acetaminophen  1,000 mg Oral Q6H   Chlorhexidine Gluconate Cloth  6 each Topical Daily   enoxaparin (LOVENOX) injection  40 mg Subcutaneous Q24H   escitalopram  20 mg Oral Daily   gabapentin  800 mg Oral TID   HYDROmorphone   Intravenous Q4H   LORazepam  0.5 mg Oral q morning   LORazepam  1  mg Oral QPM   metoprolol tartrate  12.5 mg Oral Daily   predniSONE  10 mg Oral Q breakfast   Continuous Infusions:  lactated ringers 100 mL/hr at 06/11/21 0527   piperacillin-tazobactam (ZOSYN)  IV 12.5 mL/hr at 06/11/21 0527     LOS: 8 days    Time spent: 25 mins.More than 50% of that time was spent in counseling and/or coordination of care.      Shelly Coss, MD [facility] P9/24/2022, 7:48 AM

## 2021-06-12 DIAGNOSIS — K5792 Diverticulitis of intestine, part unspecified, without perforation or abscess without bleeding: Secondary | ICD-10-CM | POA: Diagnosis not present

## 2021-06-12 MED ORDER — SILVER NITRATE-POT NITRATE 75-25 % EX MISC
1.0000 "application " | CUTANEOUS | Status: AC
  Filled 2021-06-12: qty 1

## 2021-06-12 MED ORDER — METHYLPREDNISOLONE SODIUM SUCC 40 MG IJ SOLR
10.0000 mg | Freq: Every day | INTRAMUSCULAR | Status: DC
  Administered 2021-06-12 – 2021-06-14 (×3): 10 mg via INTRAVENOUS
  Filled 2021-06-12 (×3): qty 1

## 2021-06-12 MED ORDER — LORAZEPAM 2 MG/ML IJ SOLN: 0.5000 mg | Freq: Four times a day (QID) | INTRAMUSCULAR | Status: DC | PRN

## 2021-06-12 MED ORDER — METOPROLOL TARTRATE 5 MG/5ML IV SOLN
5.0000 mg | Freq: Four times a day (QID) | INTRAVENOUS | Status: DC
  Administered 2021-06-12 – 2021-06-15 (×12): 5 mg via INTRAVENOUS
  Filled 2021-06-12 (×12): qty 5

## 2021-06-12 MED ORDER — SILVER NITRATE-POT NITRATE 75-25 % EX MISC
1.0000 "application " | Freq: Once | CUTANEOUS | Status: DC
  Filled 2021-06-12: qty 1

## 2021-06-12 NOTE — Anesthesia Postprocedure Evaluation (Signed)
Anesthesia Post Note  Patient: Brittany Hobbs  Procedure(s) Performed: OPEN SIGMOID COLON RESECTION WITH END COLOSTOMY AND ABDOMINAL WASHOUT (Abdomen) CYSTOSCOPY WITH LEFT STENT PLACEMENT ([place])     Patient location during evaluation: PACU Anesthesia Type: General Level of consciousness: awake and alert Pain management: pain level controlled Vital Signs Assessment: post-procedure vital signs reviewed and stable Respiratory status: spontaneous breathing, nonlabored ventilation, respiratory function stable and patient connected to nasal cannula oxygen Cardiovascular status: blood pressure returned to baseline and stable Postop Assessment: no apparent nausea or vomiting Anesthetic complications: no   No notable events documented.  Last Vitals:  Vitals:   06/12/21 0420 06/12/21 0540  BP:  (!) 165/75  Pulse:  83  Resp: 14 16  Temp:  36.6 C  SpO2: 97% 98%    Last Pain:  Vitals:   06/12/21 0540  TempSrc: Oral  PainSc:                  [place] S

## 2021-06-12 NOTE — Progress Notes (Signed)
PROGRESS NOTE    Brittany Hobbs  GEX:[id number] DOB: Oct 03, 1960 DOA: 06/03/2021 PCP: Berkley Harvey, NP   Chief Complain: Abdominal pain  Brief Narrative:  Patient is a 60 year old female with history of anxiety, lupus who presented with abdominal pain.  She has recent history of total knee replacement.  Imagings of the abdomen done on presentation showed diverticulitis with pneumoperitoneum suggesting perforation.  General surgery consulted and following. She was started  on conservative management with IV antibiotics.  She was improving but again developed abdominal pain and distention. 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  Assessment & Plan:   Active Problems:   Acute diverticulitis   Acute diverticulitis with perforation: She was overall improving but she complained of bloating on 06/08/21 ,CT-scan of the abdomen and showed acute sigmoid diverticulitis without definite evidence of perforation or abscess collection,possible distended fluid-filled small bowel loops .  Now S/P  sigmoid colectomy ,end colostomy, placement of intra-abdominal drain on 9.23.22.  Currently n.p.o.On zosyn On Dilaudid PCA  Leukocytosis: Mild elevation.  Patient is afebrile. Continue to monitor  Right lower extremity edema: Recent history of TKA.  Has stitches.Follow-up with orthopedics recommended as an outpatient.  Doppler study did not show DVT.  Anxiety: On anxiety medications as needed  Lupus: Takes  prednisone 10 mg daily at home, now substituted with Solu-Medrol 10 mg IV  Hypokalemia: Stable now          DVT prophylaxis:Lovenox Code Status: Full Family Communication: None at bedside Status is: Inpatient  Remains inpatient appropriate because:Inpatient level of care appropriate due to severity of illness  Dispo: The patient is from: Home              Anticipated d/c is to: Home              Patient currently is not medically stable to d/c.   Difficult to place  patient No      Consultants: General surgery  Procedures:None  Antimicrobials:  Anti-infectives (From admission, onward)    Start     Dose/Rate Route Frequency Ordered Stop   06/03/21 2000  piperacillin-tazobactam (ZOSYN) IVPB 3.375 g        3.375 g 12.5 mL/hr over 240 Minutes Intravenous Every 8 hours 06/03/21 1528     06/03/21 1330  piperacillin-tazobactam (ZOSYN) IVPB 3.375 g        3.375 g 100 mL/hr over 30 Minutes Intravenous  Once 06/03/21 1315 06/03/21 1527       Subjective:  Patient seen and examined at the bedside this morning.  Hemodynamically stable.  Complains of abdominal discomfort on the surgical site.  NG tube draining dark.  No stool in the colostomy bag  Objective: Vitals:   06/11/21 2317 06/12/21 0125 06/12/21 0420 06/12/21 0540  BP:  138/62  (!) 165/75  Pulse:  79  83  Resp: 14 16 14 16   Temp:  (!) 97.5 F (36.4 C)  97.8 F (36.6 C)  TempSrc:  Oral  Oral  SpO2: 95% 92% 97% 98%  Weight:      Height:        Intake/Output Summary (Last 24 hours) at 06/12/2021 0738 Last data filed at 06/12/2021 0600 Gross per 24 hour  Intake 2533.22 ml  Output 1960 ml  Net 573.22 ml   Filed Weights   06/03/21 1525  Weight: 64 kg    Examination:  General exam: in mild to moderate distress due to abd pain HEENT: NG tube Respiratory system:  no wheezes or crackles  Cardiovascular system: S1 & S2 heard, RRR.  Gastrointestinal system: Abdomen is distended, appropriately tender, surgical wound, colostomy.  No bowel sounds heard Central nervous system: Alert and oriented Extremities: No edema, no clubbing ,no cyanosis Skin: No rashes, no ulcers,no icterus       Data Reviewed: I have personally reviewed following labs and imaging studies  CBC: Recent Labs  Lab 06/06/21 0339 06/07/21 0419 06/08/21 0400 06/09/21 0423 06/10/21 0411 06/11/21 0814  WBC 13.6* 11.6* 15.5* 14.5* 14.0* 11.8*  NEUTROABS 11.5*  --   --  12.1* 11.8* 10.3*  HGB 12.3 11.9* 11.8*  12.0 12.1 13.1  HCT 38.4 37.7 37.3 36.9 36.9 41.2  MCV 88.9 89.1 88.8 85.6 86.0 86.9  PLT 334 305 325 356 353 638*   Basic Metabolic Panel: Recent Labs  Lab 06/06/21 0339 06/09/21 0423 06/10/21 0411 06/11/21 0814  NA 140 138 141 137  K 3.8 3.4* 3.8 4.7  CL 103 99 100 96*  CO2 27 30 29 28   GLUCOSE 97 96 84 132*  BUN 19 12 10 12   CREATININE 0.64 0.52 0.54 0.49  CALCIUM 8.7* 8.3* 8.7* 8.2*   GFR: Estimated Creatinine Clearance: 68.1 mL/min (by C-G formula based on SCr of 0.49 mg/dL). Liver Function Tests: No results for input(s): AST, ALT, ALKPHOS, BILITOT, PROT, ALBUMIN in the last 168 hours.  No results for input(s): LIPASE, AMYLASE in the last 168 hours.  No results for input(s): AMMONIA in the last 168 hours. Coagulation Profile: No results for input(s): INR, PROTIME in the last 168 hours. Cardiac Enzymes: No results for input(s): CKTOTAL, CKMB, CKMBINDEX, TROPONINI in the last 168 hours. BNP (last 3 results) No results for input(s): PROBNP in the last 8760 hours. HbA1C: No results for input(s): HGBA1C in the last 72 hours. CBG: No results for input(s): GLUCAP in the last 168 hours. Lipid Profile: No results for input(s): CHOL, HDL, LDLCALC, TRIG, CHOLHDL, LDLDIRECT in the last 72 hours. Thyroid Function Tests: No results for input(s): TSH, T4TOTAL, FREET4, T3FREE, THYROIDAB in the last 72 hours. Anemia Panel: No results for input(s): VITAMINB12, FOLATE, FERRITIN, TIBC, IRON, RETICCTPCT in the last 72 hours. Sepsis Labs: No results for input(s): PROCALCITON, LATICACIDVEN in the last 168 hours.  Recent Results (from the past 240 hour(s))  Resp Panel by RT-PCR (Flu A&B, Covid) Nasopharyngeal Swab     Status: None   Collection Time: 06/03/21  9:51 AM   Specimen: Nasopharyngeal Swab; Nasopharyngeal(NP) swabs in vial transport medium  Result Value Ref Range Status   SARS Coronavirus 2 by RT PCR NEGATIVE NEGATIVE Final    Comment: (NOTE) SARS-CoV-2 target nucleic  acids are NOT DETECTED.  The SARS-CoV-2 RNA is generally detectable in upper respiratory specimens during the acute phase of infection. The lowest concentration of SARS-CoV-2 viral copies this assay can detect is 138 copies/mL. A negative result does not preclude SARS-Cov-2 infection and should not be used as the sole basis for treatment or other patient management decisions. A negative result may occur with  improper specimen collection/handling, submission of specimen other than nasopharyngeal swab, presence of viral mutation(s) within the areas targeted by this assay, and inadequate number of viral copies(<138 copies/mL). A negative result must be combined with clinical observations, patient history, and epidemiological information. The expected result is Negative.  Fact Sheet for Patients:  EntrepreneurPulse.com.au  Fact Sheet for Healthcare Providers:  IncredibleEmployment.be  This test is no t yet approved or cleared by the [place] and  has been authorized for detection and/or diagnosis of SARS-CoV-2 by FDA under an Emergency Use Authorization (EUA). This EUA will remain  in effect (meaning this test can be used) for the duration of the COVID-19 declaration under Section 564(b)(1) of the Act, 21 U.S.C.section 360bbb-3(b)(1), unless the authorization is terminated  or revoked sooner.       Influenza A by PCR NEGATIVE NEGATIVE Final   Influenza B by PCR NEGATIVE NEGATIVE Final    Comment: (NOTE) The Xpert Xpress SARS-CoV-2/FLU/RSV plus assay is intended as an aid in the diagnosis of influenza from Nasopharyngeal swab specimens and should not be used as a sole basis for treatment. Nasal washings and aspirates are unacceptable for Xpert Xpress SARS-CoV-2/FLU/RSV testing.  Fact Sheet for Patients: EntrepreneurPulse.com.au  Fact Sheet for Healthcare Providers: IncredibleEmployment.be  This  test is not yet approved or cleared by the [place] FDA and has been authorized for detection and/or diagnosis of SARS-CoV-2 by FDA under an Emergency Use Authorization (EUA). This EUA will remain in effect (meaning this test can be used) for the duration of the COVID-19 declaration under Section 564(b)(1) of the Act, 21 U.S.C. section 360bbb-3(b)(1), unless the authorization is terminated or revoked.  Performed at [facility], [place] [address], [place], [place] [address]   Surgical pcr screen     Status: None   Collection Time: 06/09/21  2:12 PM   Specimen: Nasal Mucosa; Nasal Swab  Result Value Ref Range Status   MRSA, PCR NEGATIVE NEGATIVE Final   Staphylococcus aureus NEGATIVE NEGATIVE Final    Comment: (NOTE) The Xpert SA Assay (FDA approved for NASAL specimens in patients 5 years of age and older), is one component of a comprehensive surveillance program. It is not intended to diagnose infection nor to guide or monitor treatment. Performed at [facility], [place] [address] [place], [place] [address]   Aerobic/Anaerobic Culture w Gram Stain (surgical/deep wound)     Status: None (Preliminary result)   Collection Time: 06/10/21  4:07 PM   Specimen: PATH Other; Tissue  Result Value Ref Range Status   Specimen Description   Final    ABSCESS PERITONEAL Performed at [place] [address], [place], [place] [address]    Special Requests   Final    NONE Performed at [facility], [place] [address] [place], [place] [address]    Gram Stain   Final    FEW SQUAMOUS EPITHELIAL CELLS PRESENT FEW WBC SEEN FEW GRAM NEGATIVE RODS    Culture   Final    TOO YOUNG TO READ Performed at [facility], [place] [address] [place], [place] [address]    Report Status PENDING  Incomplete         Radiology Studies: DG Abd 1 View  Result Date: 06/11/2021 CLINICAL DATA:  NG tube EXAM: ABDOMEN - 1  VIEW COMPARISON:  CT 06/08/2021 FINDINGS: Esophageal tube tip overlies the proximal stomach, side-port in the region of GE junction. Further advancement could be considered for more optimal position. Mild air distension of bowel in the abdomen IMPRESSION: Esophageal tube side-port in the region of GE junction, further advancement could be considered for more optimal positioning Electronically Signed   By: Donavan Foil M.D.   On: 06/11/2021 22:47        Scheduled Meds:  acetaminophen  1,000 mg Oral Q6H   Chlorhexidine Gluconate Cloth  6 each Topical Daily   enoxaparin (LOVENOX) injection  40 mg Subcutaneous  Q24H   escitalopram  20 mg Oral Daily   gabapentin  800 mg Oral TID   HYDROmorphone   Intravenous Q4H   LORazepam  0.5 mg Oral q morning   LORazepam  1 mg Oral QPM   metoprolol tartrate  12.5 mg Oral Daily   predniSONE  10 mg Oral Q breakfast   Continuous Infusions:  lactated ringers 100 mL/hr at 06/12/21 0211   piperacillin-tazobactam (ZOSYN)  IV 3.375 g (06/12/21 0418)     LOS: 9 days    Time spent: 25 mins.More than 50% of that time was spent in counseling and/or coordination of care.      Shelly Coss, MD [facility] P9/25/2022, 7:38 AM

## 2021-06-12 NOTE — Progress Notes (Signed)
Pt's NG tube came out, again, it came out last night also. We attempted to put it back and she refused at this time. Will revisit it after she calms down.

## 2021-06-12 NOTE — Progress Notes (Signed)
2 Days Post-Op   Subjective/Chief Complaint: Complains of pain, unchanged   Objective: Vital signs in last 24 hours: Temp:  [97.4 F (36.3 C)-98.9 F (37.2 C)] 97.8 F (36.6 C) (09/25 0540) Pulse Rate:  [63-83] 83 (09/25 0540) Resp:  [10-18] 16 (09/25 0540) BP: (125-165)/(60-98) 165/75 (09/25 0540) SpO2:  [90 %-98 %] 98 % (09/25 0540) FiO2 (%):  [90 %-99 %] 90 % (09/24 1520) Last BM Date: 06/09/21  Intake/Output from previous day: 09/24 0701 - 09/25 0700 In: 1324.4 [I.V.:2284.9; NG/GT:100; IV Piggyback:148.3] Out: 1960 [Urine:1000; Emesis/NG output:750; Drains:210] Intake/Output this shift: No intake/output data recorded.  General appearance: alert and cooperative Resp: clear to auscultation bilaterally Cardio: regular rate and rhythm GI: soft, appropriately tender. Wound clean. Ostomy pink with no output yet  Lab Results:  Recent Labs    06/10/21 0411 06/11/21 0814  WBC 14.0* 11.8*  HGB 12.1 13.1  HCT 36.9 41.2  PLT 353 444*   BMET Recent Labs    06/10/21 0411 06/11/21 0814  NA 141 137  K 3.8 4.7  CL 100 96*  CO2 29 28  GLUCOSE 84 132*  BUN 10 12  CREATININE 0.54 0.49  CALCIUM 8.7* 8.2*   PT/INR No results for input(s): LABPROT, INR in the last 72 hours. ABG No results for input(s): PHART, HCO3 in the last 72 hours.  Invalid input(s): PCO2, PO2  Studies/Results: DG Abd 1 View  Result Date: 06/11/2021 CLINICAL DATA:  NG tube EXAM: ABDOMEN - 1 VIEW COMPARISON:  CT 06/08/2021 FINDINGS: Esophageal tube tip overlies the proximal stomach, side-port in the region of GE junction. Further advancement could be considered for more optimal position. Mild air distension of bowel in the abdomen IMPRESSION: Esophageal tube side-port in the region of GE junction, further advancement could be considered for more optimal positioning Electronically Signed   By: Donavan Foil M.D.   On: 06/11/2021 22:47    Anti-infectives: Anti-infectives (From admission, onward)     Start     Dose/Rate Route Frequency Ordered Stop   06/03/21 2000  piperacillin-tazobactam (ZOSYN) IVPB 3.375 g        3.375 g 12.5 mL/hr over 240 Minutes Intravenous Every 8 hours 06/03/21 1528     06/03/21 1330  piperacillin-tazobactam (ZOSYN) IVPB 3.375 g        3.375 g 100 mL/hr over 30 Minutes Intravenous  Once 06/03/21 1315 06/03/21 1527       Assessment/Plan: s/p Procedure(s): OPEN SIGMOID COLON RESECTION WITH END COLOSTOMY AND ABDOMINAL WASHOUT (N/A) CYSTOSCOPY WITH LEFT STENT PLACEMENT Continue ng and bowel rest until bowel function returns Continue IV zosyn day 2 POD 2 OOB to chair  LOS: 9 days    [name] Messing III 06/12/2021

## 2021-06-12 NOTE — Progress Notes (Signed)
Patient ID: Brittany Hobbs, female   DOB: [birth year], 60 y.o.   MRN: [id number] The patient developed some bleeding from the skin edge. This was stopped with silver nitrate and pressure. Wound clean. She tolerated this well

## 2021-06-13 ENCOUNTER — Ambulatory Visit: Payer: BC Managed Care – PPO | Admitting: Family Medicine

## 2021-06-13 ENCOUNTER — Encounter (HOSPITAL_COMMUNITY): Payer: Self-pay | Admitting: Surgery

## 2021-06-13 DIAGNOSIS — K5792 Diverticulitis of intestine, part unspecified, without perforation or abscess without bleeding: Secondary | ICD-10-CM | POA: Diagnosis not present

## 2021-06-13 LAB — BASIC METABOLIC PANEL
Anion gap: 9 (ref 5–15)
BUN: 16 mg/dL (ref 6–20)
CO2: 33 mmol/L — ABNORMAL HIGH (ref 22–32)
Calcium: 8.1 mg/dL — ABNORMAL LOW (ref 8.9–10.3)
Chloride: 97 mmol/L — ABNORMAL LOW (ref 98–111)
Creatinine, Ser: 0.37 mg/dL — ABNORMAL LOW (ref 0.44–1.00)
GFR, Estimated: >60 mL/min (ref >60)
Glucose, Bld: 79 mg/dL (ref 70–99)
Potassium: 3.8 mmol/L (ref 3.5–5.1)
Sodium: 139 mmol/L (ref 135–145)

## 2021-06-13 LAB — CBC
HCT: 32.6 % — ABNORMAL LOW (ref 36.0–46.0)
Hemoglobin: 10.3 g/dL — ABNORMAL LOW (ref 12.0–15.0)
MCH: 27.8 pg (ref 26.0–34.0)
MCHC: 31.6 g/dL (ref 30.0–36.0)
MCV: 88.1 fL (ref 80.0–100.0)
Platelets: 457 10*3/uL — ABNORMAL HIGH (ref 150–400)
RBC: 3.7 MIL/uL — ABNORMAL LOW (ref 3.87–5.11)
RDW: 16.4 % — ABNORMAL HIGH (ref 11.5–15.5)
WBC: 10.8 10*3/uL — ABNORMAL HIGH (ref 4.0–10.5)
nRBC: 0 % (ref 0.0–0.2)

## 2021-06-13 MED ORDER — MORPHINE SULFATE (PF) 2 MG/ML IV SOLN
2.0000 mg | INTRAVENOUS | Status: DC | PRN
  Administered 2021-06-13 – 2021-06-16 (×5): 2 mg via INTRAVENOUS
  Filled 2021-06-13 (×5): qty 1

## 2021-06-13 MED ORDER — OXYCODONE HCL 5 MG PO TABS
5.0000 mg | ORAL_TABLET | ORAL | Status: DC | PRN
Start: 2021-06-13 — End: 2021-06-18
  Administered 2021-06-13 – 2021-06-18 (×19): 10 mg via ORAL
  Filled 2021-06-13 (×20): qty 2

## 2021-06-13 MED ORDER — ACETAMINOPHEN 500 MG PO TABS
1000.0000 mg | ORAL_TABLET | Freq: Four times a day (QID) | ORAL | Status: DC | PRN
  Administered 2021-06-14 – 2021-06-18 (×7): 1000 mg via ORAL
  Filled 2021-06-13 (×7): qty 2

## 2021-06-13 NOTE — Progress Notes (Signed)
3 Days Post-Op  Subjective: CC: Patient initially thought she was at a baseball game with her brother but then became easily orientated x 3. She reports some left sided abdominal pain. NGT out yesterday on accident. No n/v since. Passing flatus from ostomy. No further bleeding from midline wound since Dr. Marlou Starks silver nitrated the area. Foley still in place. Unclear if she has been mobilizing.   Objective: Vital signs in last 24 hours: Temp:  [97.5 F (36.4 C)-98.2 F (36.8 C)] 97.6 F (36.4 C) (09/26 9735) Pulse Rate:  [62-80] 76 (09/26 0611) Resp:  [12-18] 18 (09/26 0611) BP: (127-149)/(55-92) 144/64 (09/26 0611) SpO2:  [95 %-100 %] 97 % (09/26 0611) FiO2 (%):  [90 %-93 %] 93 % (09/26 0418) Last BM Date: 06/09/21  Intake/Output from previous day: 09/25 0701 - 09/26 0700 In: 2499.6 [I.V.:2305; IV Piggyback:194.6] Out: 1900 [Urine:1450; Emesis/NG output:300; Drains:150] Intake/Output this shift: No intake/output data recorded.  PE: Gen:  Alert, NAD, pleasant HEENT: EOM's intact, pupils equal and round Card:  RRR Pulm:  CTAB, no W/R/R, effort normal Abd: Soft, mild distension, left sided tenderness without peritonitis, +BS. Ostomy bag w/ air and sweat. Stoma flat and viable (passing some flatus in the room). Midline wound clean as below. Drain SS Psych: A&Ox3 Skin: no rashes noted, warm and dry      Lab Results:  Recent Labs    06/11/21 0814  WBC 11.8*  HGB 13.1  HCT 41.2  PLT 444*   BMET Recent Labs    06/11/21 0814  NA 137  K 4.7  CL 96*  CO2 28  GLUCOSE 132*  BUN 12  CREATININE 0.49  CALCIUM 8.2*   PT/INR No results for input(s): LABPROT, INR in the last 72 hours. CMP     Component Value Date/Time   NA 137 06/11/2021 0814   K 4.7 06/11/2021 0814   CL 96 (L) 06/11/2021 0814   CO2 28 06/11/2021 0814   GLUCOSE 132 (H) 06/11/2021 0814   BUN 12 06/11/2021 0814   CREATININE 0.49 06/11/2021 0814   CALCIUM 8.2 (L) 06/11/2021 0814   PROT 6.6  06/04/2021 0435   ALBUMIN 3.1 (L) 06/04/2021 0435   AST 16 06/04/2021 0435   ALT 11 06/04/2021 0435   ALKPHOS 97 06/04/2021 0435   BILITOT 1.0 06/04/2021 0435   GFRNONAA >60 06/11/2021 0814   GFRAA >60 10/02/2019 0445   Lipase     Component Value Date/Time   LIPASE 22 06/03/2021 0921    Studies/Results: DG Abd 1 View  Result Date: 06/11/2021 CLINICAL DATA:  NG tube EXAM: ABDOMEN - 1 VIEW COMPARISON:  CT 06/08/2021 FINDINGS: Esophageal tube tip overlies the proximal stomach, side-port in the region of GE junction. Further advancement could be considered for more optimal position. Mild air distension of bowel in the abdomen IMPRESSION: Esophageal tube side-port in the region of GE junction, further advancement could be considered for more optimal positioning Electronically Signed   By: Donavan Foil M.D.   On: 06/11/2021 22:47    Anti-infectives: Anti-infectives (From admission, onward)    Start     Dose/Rate Route Frequency Ordered Stop   06/03/21 2000  piperacillin-tazobactam (ZOSYN) IVPB 3.375 g        3.375 g 12.5 mL/hr over 240 Minutes Intravenous Every 8 hours 06/03/21 1528     06/03/21 1330  piperacillin-tazobactam (ZOSYN) IVPB 3.375 g        3.375 g 100 mL/hr over 30 Minutes Intravenous  Once 06/03/21  1315 06/03/21 1527        Assessment/Plan POD 3 s/p Exploratory laparotomy with washout of intra-abdominal abscess, sigmoid colectomy, end colostomy, placement of intra-abdominal drain for Perforated sigmoid diverticulitis with pelvic abscess by Dr. Zenia Resides on 9/23 - Start CLD - Cont abx. Cx's w/ Kleb PNA, sensitives pending - On PCA. If tolerates cld, consider d/c later today vs tomorrow and transitioning to oral medications w/ IV for breakthrough - Cont drain, SS - D/c Foley after mobilizes with PT - BID WTD - Mobilize, PT - Pulm toilet - Path pending - Check labs   FEN - CLD VTE - SCDs, Lovenox ID - Zosyn Foley - d/c today  Hx Lupus on steroids   LOS: 10  days    Jillyn Ledger , [facility] 06/13/2021, 9:39 AM Please see Amion for pager number during day hours 7:00am-4:30pm

## 2021-06-13 NOTE — Evaluation (Signed)
Physical Therapy Evaluation Patient Details Name: Brittany Hobbs MRN: [id number] DOB: 05-01-1961 Today's Date: 06/13/2021  History of Present Illness  60 yo female admitted with diverticulitis with perforation. S/P exl lap, washout, sigmoid colectomy, end colostomy, L stent placement 06/10/21. Recent R TKA 05/2021. H xof lupus, wrist fx, patella fx, COVID, panic attacks  Clinical Impression  Limited eval 2* pt refusing to mobilize. Pt found in recliner at start of session. She adamantly refused to mobilize with PT and pt began sobbing. Explained to pt that it is important for her to start ambulating and working with therapies. She acknowledged this but still refused to mobilize. She did allow me to assess her UE strength and she participated in a few R knee ROM exercises. Will follow and progress activity as tolerated/pt allows. D/C     Recommendations for follow up therapy are one component of a multi-disciplinary discharge planning process, led by the attending physician.  Recommendations may be updated based on patient status, additional functional criteria and insurance authorization.  Follow Up Recommendations SNF;Home health PT;Supervision/Assistance - 24 hour (depending on progress)    Equipment Recommendations  None recommended by PT    Recommendations for Other Services       Precautions / Restrictions Precautions Precautions: Fall Precaution Comments: abd sg: colostomy, jp drain; recent R TKA Restrictions Weight Bearing Restrictions: No Other Position/Activity Restrictions: WBAT      Mobility  Bed Mobility               General bed mobility comments: NT-pt refused, began sobbing with just discussing mobilizing. "I just go down.Marland KitchenMarland KitchenIm not getting up again"    Transfers                    Ambulation/Gait                Stairs            Wheelchair Mobility    Modified Rankin (Stroke Patients Only)       Balance                                              Pertinent Vitals/Pain Pain Assessment: Faces Faces Pain Scale: Hurts whole lot Pain Location: abdomen Pain Descriptors / Indicators: Crying Pain Intervention(s): Limited activity within patient's tolerance;Monitored during session;PCA encouraged    Home Living Family/patient expects to be discharged to:: Unsure Living Arrangements: Alone Available Help at Discharge: Family Type of Home: House Home Access: Stairs to enter Entrance Stairs-Rails: Can reach both Entrance Stairs-Number of Steps: 4+1   Home Equipment: Walker - standard;Cane - single point Additional Comments: information taken from previous admission    Prior Function Level of Independence: Independent with assistive device(s)         Comments: using RW     Hand Dominance        Extremity/Trunk Assessment   Upper Extremity Assessment Upper Extremity Assessment: Defer to OT evaluation    Lower Extremity Assessment Lower Extremity Assessment: Generalized weakness    Cervical / Trunk Assessment Cervical / Trunk Assessment: Normal  Communication   Communication: No difficulties  Cognition Arousal/Alertness: Awake/alert Behavior During Therapy: Anxious (when asked to mobilize) Overall Cognitive Status: No family/caregiver present to determine baseline cognitive functioning Area of Impairment: Memory;Problem solving  Memory: Decreased short-term memory       Problem Solving: Requires verbal cues General Comments: generally confused intermittently      General Comments      Exercises General Exercises - Lower Extremity Ankle Circles/Pumps: AROM;Both;10 reps Quad Sets: AROM;Both;10 reps Long Arc Quad: AROM;Right;10 reps;Seated Hip ABduction/ADduction: AROM;AAROM;Right;10 reps;Seated   Assessment/Plan    PT Assessment Patient needs continued PT services  PT Problem List Decreased strength;Decreased mobility;Decreased  range of motion;Decreased activity tolerance;Decreased balance;Decreased knowledge of use of DME;Pain;Decreased skin integrity       PT Treatment Interventions DME instruction;Gait training;Therapeutic exercise;Balance training;Functional mobility training;Therapeutic activities;Patient/family education    PT Goals (Current goals can be found in the Care Plan section)  Acute Rehab PT Goals Patient Stated Goal: less pain PT Goal Formulation: With patient Time For Goal Achievement: 06/27/21 Potential to Achieve Goals: Good    Frequency Min 3X/week   Barriers to discharge        Co-evaluation               AM-PAC PT "6 Clicks" Mobility  Outcome Measure Help needed turning from your back to your side while in a flat bed without using bedrails?: Total Help needed moving from lying on your back to sitting on the side of a flat bed without using bedrails?: Total Help needed moving to and from a bed to a chair (including a wheelchair)?: Total Help needed standing up from a chair using your arms (e.g., wheelchair or bedside chair)?: Total Help needed to walk in hospital room?: Total Help needed climbing 3-5 steps with a railing? : Total 6 Click Score: 6    End of Session   Activity Tolerance: Patient limited by pain Patient left: in chair;with call bell/phone within reach   PT Visit Diagnosis: Pain;Difficulty in walking, not elsewhere classified (R26.2) Pain - part of body:  (abdomen)    Time: 0981-1914 PT Time Calculation (min) (ACUTE ONLY): 13 min   Charges:   PT Evaluation $PT Eval Moderate Complexity: 1 Mod             Doreatha Massed, PT Acute Rehabilitation  Office: [phone] Pager: [phone]

## 2021-06-13 NOTE — Progress Notes (Signed)
PROGRESS NOTE    Brittany Hobbs  VQQ:[id number] DOB: [birth year] DOA: 06/03/2021 PCP: Berkley Harvey, NP   Chief Complain: Abdominal pain  Brief Narrative:  Patient is a 60 year old female with history of anxiety, lupus who presented with abdominal pain.  She has recent history of total knee replacement.  Imagings of the abdomen done on presentation showed diverticulitis with pneumoperitoneum suggesting perforation.  General surgery consulted and following. She was started  on conservative management with IV antibiotics.  She was improving but again developed abdominal pain and distention. 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  Assessment & Plan:   Active Problems:   Acute diverticulitis   Acute diverticulitis with perforation: She was overall improving but she complained of bloating on 06/08/21 ,CT-scan of the abdomen and showed acute sigmoid diverticulitis without definite evidence of perforation or abscess collection,possible distended fluid-filled small bowel loops .  Now S/P  sigmoid colectomy ,end colostomy, placement of intra-abdominal drain on 9.23.22.  Currently n.p.o.On zosyn On Dilaudid PCA.  Plan to start on clear liquid diet as per surgery  Leukocytosis: Mild elevation.  Patient is afebrile. Continue to monitor  Right lower extremity edema: Recent history of TKA.  Has stitches.Follow-up with orthopedics recommended as an outpatient.  Doppler study did not show DVT.  Anxiety: On anxiety medications as needed  Lupus: Takes  prednisone 10 mg daily at home, now substituted with Solu-Medrol 10 mg IV  Hypokalemia: Stable now          DVT prophylaxis:Lovenox Code Status: Full Family Communication: None at bedside Status is: Inpatient  Remains inpatient appropriate because:Inpatient level of care appropriate due to severity of illness  Dispo: The patient is from: Home              Anticipated d/c is to: Home              Patient currently is  not medically stable to d/c.   Difficult to place patient No      Consultants: General surgery  Procedures:None  Antimicrobials:  Anti-infectives (From admission, onward)    Start     Dose/Rate Route Frequency Ordered Stop   06/03/21 2000  piperacillin-tazobactam (ZOSYN) IVPB 3.375 g        3.375 g 12.5 mL/hr over 240 Minutes Intravenous Every 8 hours 06/03/21 1528     06/03/21 1330  piperacillin-tazobactam (ZOSYN) IVPB 3.375 g        3.375 g 100 mL/hr over 30 Minutes Intravenous  Once 06/03/21 1315 06/03/21 1527       Subjective:  Patient seen and examined at the bedside this morning.  hemodynamically stable.  Overall comfortable.  Denies any new complaints today.  There was scant sanguinous fluid in the colostomy.  Bloody drainage from the right lower quadrant drain   Objective: Vitals:   06/13/21 0005 06/13/21 0051 06/13/21 0418 06/13/21 0611  BP:  137/68  (!) 144/64  Pulse:  71  76  Resp: 15 18 12 18   Temp:  97.8 F (36.6 C)  97.6 F (36.4 C)  TempSrc:  Oral  Oral  SpO2: 97% 98% 97% 97%  Weight:      Height:        Intake/Output Summary (Last 24 hours) at 06/13/2021 0734 Last data filed at 06/13/2021 0600 Gross per 24 hour  Intake 1646.19 ml  Output 1870 ml  Net -223.81 ml   Filed Weights   06/03/21 1525  Weight: 64 kg    Examination:  General exam: Overall comfortable, not in distress HEENT: PERRL Respiratory system:  no wheezes or crackles  Cardiovascular system: S1 & S2 heard, RRR.  Gastrointestinal system: Abdomen is nondistended, midline surgical wound, colostomy, right lower quadrant drain Central nervous system: Alert and oriented Extremities: No edema, no clubbing ,no cyanosis Skin: No rashes, no ulcers,no icterus     Data Reviewed: I have personally reviewed following labs and imaging studies  CBC: Recent Labs  Lab 06/07/21 0419 06/08/21 0400 06/09/21 0423 06/10/21 0411 06/11/21 0814  WBC 11.6* 15.5* 14.5* 14.0* 11.8*   NEUTROABS  --   --  12.1* 11.8* 10.3*  HGB 11.9* 11.8* 12.0 12.1 13.1  HCT 37.7 37.3 36.9 36.9 41.2  MCV 89.1 88.8 85.6 86.0 86.9  PLT 305 325 356 353 675*   Basic Metabolic Panel: Recent Labs  Lab 06/09/21 0423 06/10/21 0411 06/11/21 0814  NA 138 141 137  K 3.4* 3.8 4.7  CL 99 100 96*  CO2 30 29 28   GLUCOSE 96 84 132*  BUN 12 10 12   CREATININE 0.52 0.54 0.49  CALCIUM 8.3* 8.7* 8.2*   GFR: Estimated Creatinine Clearance: 68.1 mL/min (by C-G formula based on SCr of 0.49 mg/dL). Liver Function Tests: No results for input(s): AST, ALT, ALKPHOS, BILITOT, PROT, ALBUMIN in the last 168 hours.  No results for input(s): LIPASE, AMYLASE in the last 168 hours.  No results for input(s): AMMONIA in the last 168 hours. Coagulation Profile: No results for input(s): INR, PROTIME in the last 168 hours. Cardiac Enzymes: No results for input(s): CKTOTAL, CKMB, CKMBINDEX, TROPONINI in the last 168 hours. BNP (last 3 results) No results for input(s): PROBNP in the last 8760 hours. HbA1C: No results for input(s): HGBA1C in the last 72 hours. CBG: No results for input(s): GLUCAP in the last 168 hours. Lipid Profile: No results for input(s): CHOL, HDL, LDLCALC, TRIG, CHOLHDL, LDLDIRECT in the last 72 hours. Thyroid Function Tests: No results for input(s): TSH, T4TOTAL, FREET4, T3FREE, THYROIDAB in the last 72 hours. Anemia Panel: No results for input(s): VITAMINB12, FOLATE, FERRITIN, TIBC, IRON, RETICCTPCT in the last 72 hours. Sepsis Labs: No results for input(s): PROCALCITON, LATICACIDVEN in the last 168 hours.  Recent Results (from the past 240 hour(s))  Resp Panel by RT-PCR (Flu A&B, Covid) Nasopharyngeal Swab     Status: None   Collection Time: 06/03/21  9:51 AM   Specimen: Nasopharyngeal Swab; Nasopharyngeal(NP) swabs in vial transport medium  Result Value Ref Range Status   SARS Coronavirus 2 by RT PCR NEGATIVE NEGATIVE Final    Comment: (NOTE) SARS-CoV-2 target nucleic acids  are NOT DETECTED.  The SARS-CoV-2 RNA is generally detectable in upper respiratory specimens during the acute phase of infection. The lowest concentration of SARS-CoV-2 viral copies this assay can detect is 138 copies/mL. A negative result does not preclude SARS-Cov-2 infection and should not be used as the sole basis for treatment or other patient management decisions. A negative result may occur with  improper specimen collection/handling, submission of specimen other than nasopharyngeal swab, presence of viral mutation(s) within the areas targeted by this assay, and inadequate number of viral copies(<138 copies/mL). A negative result must be combined with clinical observations, patient history, and epidemiological information. The expected result is Negative.  Fact Sheet for Patients:  EntrepreneurPulse.com.au  Fact Sheet for Healthcare Providers:  IncredibleEmployment.be  This test is no t yet approved or cleared by the [place] FDA and  has been authorized for detection and/or diagnosis of SARS-CoV-2 by  FDA under an Emergency Use Authorization (EUA). This EUA will remain  in effect (meaning this test can be used) for the duration of the COVID-19 declaration under Section 564(b)(1) of the Act, 21 U.S.C.section 360bbb-3(b)(1), unless the authorization is terminated  or revoked sooner.       Influenza A by PCR NEGATIVE NEGATIVE Final   Influenza B by PCR NEGATIVE NEGATIVE Final    Comment: (NOTE) The Xpert Xpress SARS-CoV-2/FLU/RSV plus assay is intended as an aid in the diagnosis of influenza from Nasopharyngeal swab specimens and should not be used as a sole basis for treatment. Nasal washings and aspirates are unacceptable for Xpert Xpress SARS-CoV-2/FLU/RSV testing.  Fact Sheet for Patients: EntrepreneurPulse.com.au  Fact Sheet for Healthcare Providers: IncredibleEmployment.be  This test is  not yet approved or cleared by the [place] FDA and has been authorized for detection and/or diagnosis of SARS-CoV-2 by FDA under an Emergency Use Authorization (EUA). This EUA will remain in effect (meaning this test can be used) for the duration of the COVID-19 declaration under Section 564(b)(1) of the Act, 21 U.S.C. section 360bbb-3(b)(1), unless the authorization is terminated or revoked.  Performed at [facility], [place] [address], [place], [place] [address]   Surgical pcr screen     Status: None   Collection Time: 06/09/21  2:12 PM   Specimen: Nasal Mucosa; Nasal Swab  Result Value Ref Range Status   MRSA, PCR NEGATIVE NEGATIVE Final   Staphylococcus aureus NEGATIVE NEGATIVE Final    Comment: (NOTE) The Xpert SA Assay (FDA approved for NASAL specimens in patients 23 years of age and older), is one component of a comprehensive surveillance program. It is not intended to diagnose infection nor to guide or monitor treatment. Performed at [facility], [place] [address] [place], [place] [address]   Aerobic/Anaerobic Culture w Gram Stain (surgical/deep wound)     Status: None (Preliminary result)   Collection Time: 06/10/21  4:07 PM   Specimen: PATH Other; Tissue  Result Value Ref Range Status   Specimen Description   Final    ABSCESS PERITONEAL Performed at [facility] [place] [address] [place], [place] [address]    Special Requests   Final    NONE Performed at [facility], [place] [address], [place], [place] [address]    Gram Stain   Final    FEW SQUAMOUS EPITHELIAL CELLS PRESENT FEW WBC SEEN FEW GRAM NEGATIVE RODS Performed at [facility], [place] [address] [place], [place] [address]    Culture   Final    FEW KLEBSIELLA PNEUMONIAE CULTURE REINCUBATED FOR BETTER GROWTH SUSCEPTIBILITIES TO FOLLOW NO ANAEROBES ISOLATED; CULTURE IN PROGRESS FOR 5 DAYS    Report Status PENDING  Incomplete          Radiology Studies: DG Abd 1 View  Result Date: 06/11/2021 CLINICAL DATA:  NG tube EXAM: ABDOMEN - 1 VIEW COMPARISON:  CT 06/08/2021 FINDINGS: Esophageal tube tip overlies the proximal stomach, side-port in the region of GE junction. Further advancement could be considered for more optimal position. Mild air distension of bowel in the abdomen IMPRESSION: Esophageal tube side-port in the region of GE junction, further advancement could be considered for more optimal positioning Electronically Signed   By: Donavan Foil M.D.   On: 06/11/2021 22:47        Scheduled Meds:  Chlorhexidine Gluconate Cloth  6 each Topical Daily   enoxaparin (LOVENOX) injection  40 mg Subcutaneous Q24H  HYDROmorphone   Intravenous Q4H   methylPREDNISolone (SOLU-MEDROL) injection  10 mg Intravenous Daily   metoprolol tartrate  5 mg Intravenous Q6H   silver nitrate applicators  1 application Topical STAT   Continuous Infusions:  lactated ringers 100 mL/hr at 06/13/21 0638   piperacillin-tazobactam (ZOSYN)  IV 3.375 g (06/13/21 0447)     LOS: 10 days    Time spent: 25 mins.More than 50% of that time was spent in counseling and/or coordination of care.      Shelly Coss, MD [facility] P9/26/2022, 7:34 AM

## 2021-06-13 NOTE — Progress Notes (Signed)
Pt confused this evening. Pulled out 2 iv's. Foley was removed but it was stretched earlier when the pt attempted to get out of the chair and when the balloon was deflated, it practically came right out, so not sure if the patient had dragged it down her urethra. She was complaining of pain and asked [place] to remove it. There was an order for it to be removed once the patient ambulated so she was walked in the a hall and then to the bathroom where she voided 100 mls of clear yellow urine, no blood noted.

## 2021-06-13 NOTE — Consult Note (Signed)
[facility] Nurse ostomy follow up Patient receiving care in [facility]. Stoma type/location: LUQ colostomy Stomal assessment/size: approximately 1 3/8 inches, round, edematous, sutures intact, pink Peristomal assessment: intact Treatment options for stomal/peristomal skin: barrier ring. Output: gas only Ostomy pouching: 1pc. flat Education provided: Patient was VERY confused today. Very delayed in responding to questions. Due to her confusion, I was unable to do any teaching today. Patient states she lives alone. Enrolled patient in [facility] Discharge program: No  Use flat one piece ostomy pouch, Pattricia Boss; barrier ring, Kellie Simmering [phone].  Val Riles, RN, MSN, CWOCN, CNS-BC, pager [phone]

## 2021-06-14 ENCOUNTER — Inpatient Hospital Stay (HOSPITAL_COMMUNITY): Payer: BC Managed Care – PPO

## 2021-06-14 DIAGNOSIS — K5792 Diverticulitis of intestine, part unspecified, without perforation or abscess without bleeding: Secondary | ICD-10-CM | POA: Diagnosis not present

## 2021-06-14 LAB — SURGICAL PATHOLOGY

## 2021-06-14 IMAGING — DX DG CHEST 1V PORT
1 series · 1 of 1 positions shown · non-contrast
Comparison: [DATE]

CLINICAL DATA: Acute respiratory failure with hypoxia

EXAM:
PORTABLE CHEST 1 VIEW

[chest ap]
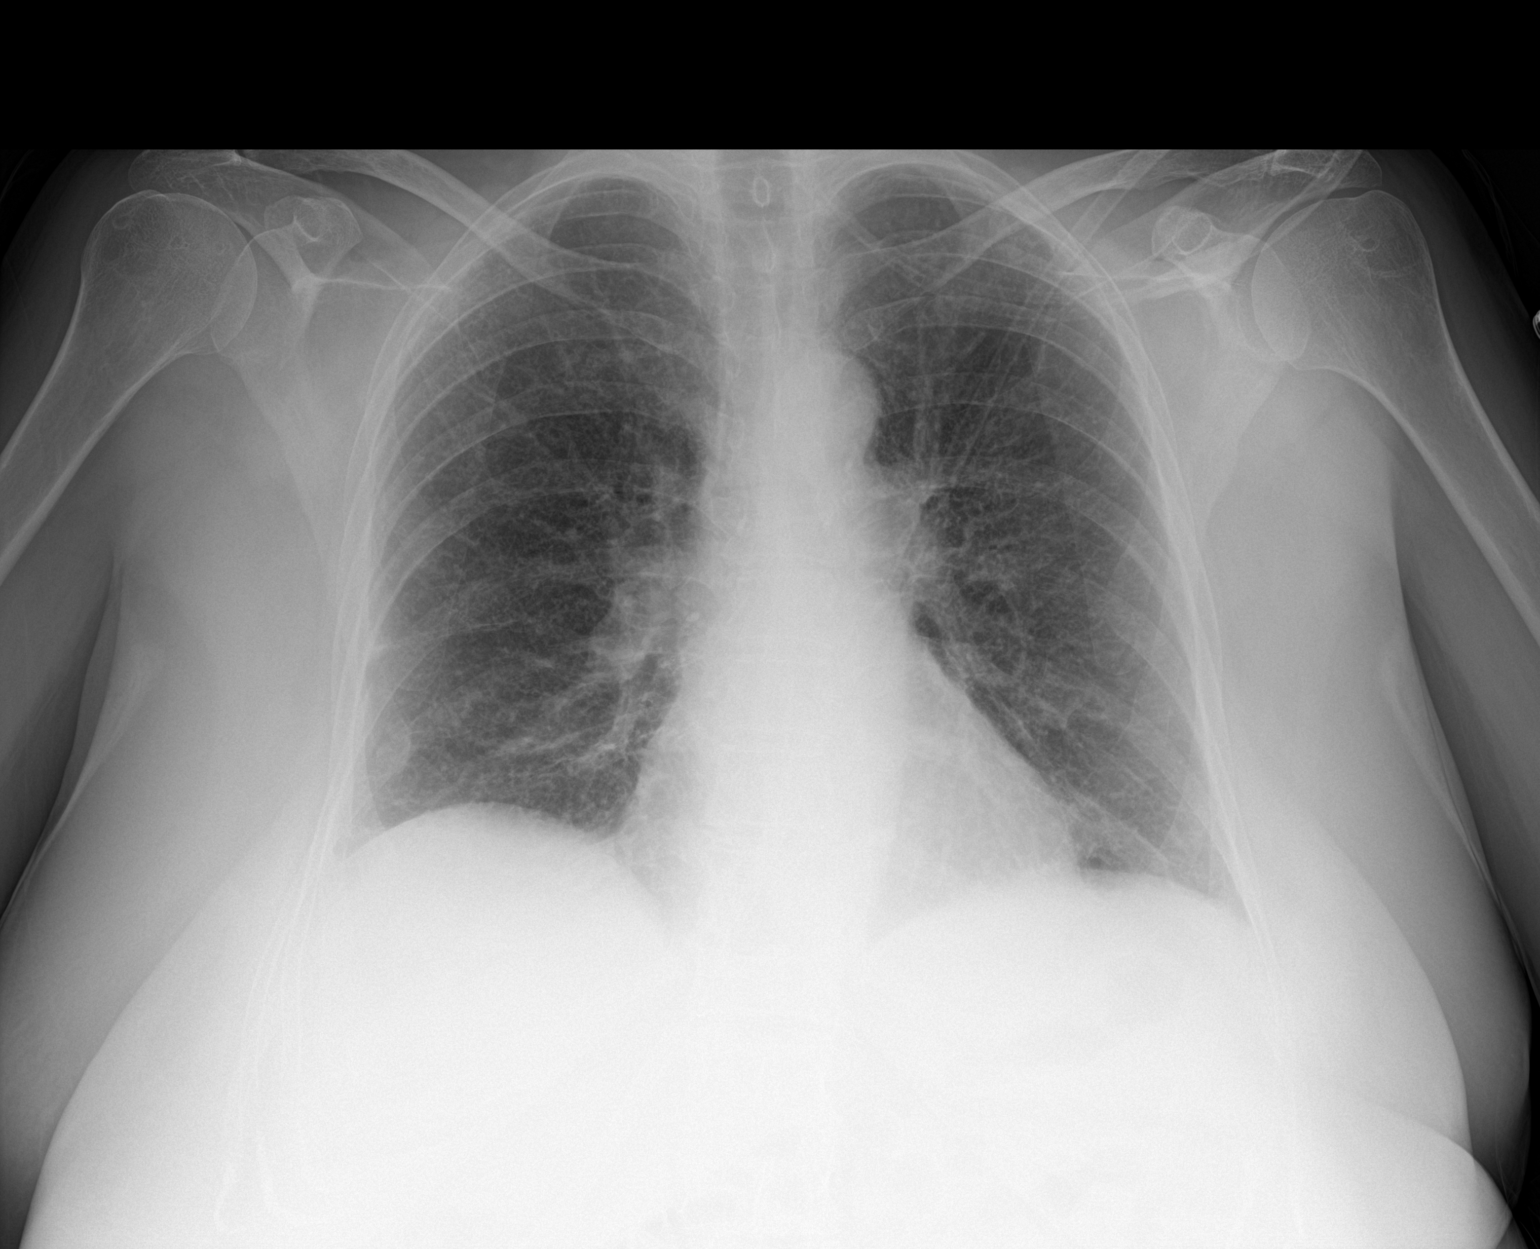

[1 of 1 positions shown; findings below may reference images not displayed]

FINDINGS: Heart is normal size. No confluent airspace opacities or effusions.
No acute bony abnormality.
IMPRESSION: No active disease.

## 2021-06-14 MED ORDER — METRONIDAZOLE 500 MG/100ML IV SOLN
500.0000 mg | Freq: Two times a day (BID) | INTRAVENOUS | Status: DC
  Administered 2021-06-14 – 2021-06-18 (×9): 500 mg via INTRAVENOUS
  Filled 2021-06-14 (×9): qty 100

## 2021-06-14 MED ORDER — SODIUM CHLORIDE 0.9 % IV SOLN
2.0000 g | INTRAVENOUS | Status: DC
  Administered 2021-06-14 – 2021-06-17 (×4): 2 g via INTRAVENOUS
  Filled 2021-06-14 (×5): qty 20

## 2021-06-14 MED ORDER — LABETALOL HCL 5 MG/ML IV SOLN
10.0000 mg | INTRAVENOUS | Status: DC | PRN
  Filled 2021-06-14: qty 4

## 2021-06-14 MED ORDER — ROPINIROLE HCL 0.5 MG PO TABS
0.5000 mg | ORAL_TABLET | Freq: Three times a day (TID) | ORAL | Status: DC | PRN
  Administered 2021-06-14 – 2021-06-15 (×4): 0.5 mg via ORAL
  Filled 2021-06-14 (×4): qty 1

## 2021-06-14 NOTE — Progress Notes (Signed)
PROGRESS NOTE    Brittany Hobbs  ZYS:[id number] DOB: 12/23/1960 DOA: 06/03/2021 PCP: Berkley Harvey, NP   Chief Complain: Abdominal pain  Brief Narrative:  Patient is a 60 year old female with history of anxiety, lupus who presented with abdominal pain.  She has recent history of total knee replacement.  Imagings of the abdomen done on presentation showed diverticulitis with pneumoperitoneum suggesting perforation.  General surgery consulted and following. She was started  on conservative management with IV antibiotics.  She was improving but again developed abdominal pain and distention. 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  We are consulting  Assessment & Plan:   Active Problems:   Acute diverticulitis   Acute diverticulitis with perforation: She was overall improving but she complained of bloating on 06/08/21 ,CT-scan of the abdomen and showed acute sigmoid diverticulitis without definite evidence of perforation or abscess collection,possible distended fluid-filled small bowel loops .  Now S/P  sigmoid colectomy ,end colostomy, placement of intra-abdominal drain on 9.23.22.  Currently on clear liquid diet.  On zosyn Colostomy had liquid brown stool today.  Leukocytosis: Mild elevation.  Patient is afebrile. Continue to monitor  Right lower extremity edema: Recent history of TKA.  Has stitches.Follow-up with orthopedics recommended as an outpatient.  Doppler study did not show DVT.  Anxiety: On anxiety medications as needed  Lupus: Takes  prednisone 10 mg daily at home, now substituted with Solu-Medrol 10 mg IV  Hypokalemia: Stable now  Transient hypoxia: She is requiring some oxygen at sleep.  She might have undiagnosed sleep apnea.  She has history of asthma.  During my evaluation this morning, she was on room air.  Chest x-ray did not show any acute findings.  Transient hypertension: Blood pressure stable since last several days.  Noted to be hypertensive  since yesterday.  Most likely secondary to pain.  Continue PRN Medications for severe hypertension  Restless legs: Started on ropinirole          DVT prophylaxis:Lovenox Code Status: Full Family Communication: None at bedside Status is: Inpatient  Remains inpatient appropriate because:Inpatient level of care appropriate due to severity of illness  Dispo: The patient is from: Home              Anticipated d/c is to: Home              Patient currently is not medically stable to d/c.   Difficult to place patient No      Consultants: General surgery  Procedures:None  Antimicrobials:  Anti-infectives (From admission, onward)    Start     Dose/Rate Route Frequency Ordered Stop   06/03/21 2000  piperacillin-tazobactam (ZOSYN) IVPB 3.375 g        3.375 g 12.5 mL/hr over 240 Minutes Intravenous Every 8 hours 06/03/21 1528     06/03/21 1330  piperacillin-tazobactam (ZOSYN) IVPB 3.375 g        3.375 g 100 mL/hr over 30 Minutes Intravenous  Once 06/03/21 1315 06/03/21 1527       Subjective:  Patient seen and examined at the bedside this morning.  She was sitting on the chair.  She was bothered by her restless legs.  She was on room air.  Colostomy had brown liquid stool.  Denies any worsening abdominal pain.   Objective: Vitals:   06/13/21 1813 06/13/21 2027 06/14/21 0022 06/14/21 0537  BP: (!) 183/77 (!) 171/69 (!) 190/78 (!) 185/81  Pulse: 65 63 76 60  Resp: 20 17  (!)  21  Temp: (!) 97.5 F (36.4 C) 97.6 F (36.4 C)  97.6 F (36.4 C)  TempSrc: Oral Oral  Oral  SpO2: 96% 100%  100%  Weight:      Height:        Intake/Output Summary (Last 24 hours) at 06/14/2021 0746 Last data filed at 06/14/2021 0600 Gross per 24 hour  Intake 3077.23 ml  Output 1225 ml  Net 1852.23 ml   Filed Weights   06/03/21 1525  Weight: 64 kg    Examination:  General exam: Overall comfortable, not in distress HEENT: PERRL Respiratory system:  no wheezes or crackles   Cardiovascular system: S1 & S2 heard, RRR.  Gastrointestinal system: Abdomen is mildly distended, midline surgical wound covered with dressing, right lower quadrant abdominal drain, colostomy with brown liquid stool Central nervous system: Alert and oriented Extremities: No edema, no clubbing ,no cyanosis, recent knee surgery scar on the right Skin: No rashes, no ulcers,no icterus    Data Reviewed: I have personally reviewed following labs and imaging studies  CBC: Recent Labs  Lab 06/08/21 0400 06/09/21 0423 06/10/21 0411 06/11/21 0814 06/13/21 0941  WBC 15.5* 14.5* 14.0* 11.8* 10.8*  NEUTROABS  --  12.1* 11.8* 10.3*  --   HGB 11.8* 12.0 12.1 13.1 10.3*  HCT 37.3 36.9 36.9 41.2 32.6*  MCV 88.8 85.6 86.0 86.9 88.1  PLT 325 356 353 444* 275*   Basic Metabolic Panel: Recent Labs  Lab 06/09/21 0423 06/10/21 0411 06/11/21 0814 06/13/21 0941  NA 138 141 137 139  K 3.4* 3.8 4.7 3.8  CL 99 100 96* 97*  CO2 30 29 28  33*  GLUCOSE 96 84 132* 79  BUN 12 10 12 16   CREATININE 0.52 0.54 0.49 0.37*  CALCIUM 8.3* 8.7* 8.2* 8.1*   GFR: Estimated Creatinine Clearance: 68.1 mL/min (A) (by C-G formula based on SCr of 0.37 mg/dL (L)). Liver Function Tests: No results for input(s): AST, ALT, ALKPHOS, BILITOT, PROT, ALBUMIN in the last 168 hours.  No results for input(s): LIPASE, AMYLASE in the last 168 hours.  No results for input(s): AMMONIA in the last 168 hours. Coagulation Profile: No results for input(s): INR, PROTIME in the last 168 hours. Cardiac Enzymes: No results for input(s): CKTOTAL, CKMB, CKMBINDEX, TROPONINI in the last 168 hours. BNP (last 3 results) No results for input(s): PROBNP in the last 8760 hours. HbA1C: No results for input(s): HGBA1C in the last 72 hours. CBG: No results for input(s): GLUCAP in the last 168 hours. Lipid Profile: No results for input(s): CHOL, HDL, LDLCALC, TRIG, CHOLHDL, LDLDIRECT in the last 72 hours. Thyroid Function Tests: No  results for input(s): TSH, T4TOTAL, FREET4, T3FREE, THYROIDAB in the last 72 hours. Anemia Panel: No results for input(s): VITAMINB12, FOLATE, FERRITIN, TIBC, IRON, RETICCTPCT in the last 72 hours. Sepsis Labs: No results for input(s): PROCALCITON, LATICACIDVEN in the last 168 hours.  Recent Results (from the past 240 hour(s))  Surgical pcr screen     Status: None   Collection Time: 06/09/21  2:12 PM   Specimen: Nasal Mucosa; Nasal Swab  Result Value Ref Range Status   MRSA, PCR NEGATIVE NEGATIVE Final   Staphylococcus aureus NEGATIVE NEGATIVE Final    Comment: (NOTE) The Xpert SA Assay (FDA approved for NASAL specimens in patients 74 years of age and older), is one component of a comprehensive surveillance program. It is not intended to diagnose infection nor to guide or monitor treatment. Performed at [facility], [place] [address] [place], [place] [address]   Aerobic/Anaerobic Culture w Gram Stain (surgical/deep wound)     Status: None (Preliminary result)   Collection Time: 06/10/21  4:07 PM   Specimen: PATH Other; Tissue  Result Value Ref Range Status   Specimen Description   Final    ABSCESS PERITONEAL Performed at [facility] [address], [place], [place] [address]    Special Requests   Final    NONE Performed at [facility], [place] [address], [place], [place] [address]    Gram Stain   Final    FEW SQUAMOUS EPITHELIAL CELLS PRESENT FEW WBC SEEN FEW GRAM NEGATIVE RODS Performed at [facility], [place] [address] [place], [place] [address]    Culture   Final    FEW KLEBSIELLA PNEUMONIAE SUSCEPTIBILITIES TO FOLLOW NO ANAEROBES ISOLATED; CULTURE IN PROGRESS FOR 5 DAYS    Report Status PENDING  Incomplete         Radiology Studies: No results found.      Scheduled Meds:  Chlorhexidine Gluconate Cloth  6 each Topical Daily   enoxaparin (LOVENOX) injection  40 mg Subcutaneous Q24H    methylPREDNISolone (SOLU-MEDROL) injection  10 mg Intravenous Daily   metoprolol tartrate  5 mg Intravenous Q6H   Continuous Infusions:  lactated ringers 100 mL/hr at 06/13/21 1605   piperacillin-tazobactam (ZOSYN)  IV 3.375 g (06/14/21 0321)     LOS: 11 days    Time spent: 25 mins.More than 50% of that time was spent in counseling and/or coordination of care.      Shelly Coss, MD [facility] P9/27/2022, 7:46 AM

## 2021-06-14 NOTE — Progress Notes (Signed)
4 Days Post-Op  Subjective: CC: Some nausea with NGT out 2 days ago but no emesis. Still with abdominal pain greatest over incision and left side. Liquid stool and flatus in ostomy. She states she is voiding well following foley out yesterday. She denies having had any liquids PO yesterday. She states she only ambulated once yesterday. She denies respiratory complaints  Discussed with RN and patient had some clears yesterday but low intake due to nausea  Objective: Vital signs in last 24 hours: Temp:  [97.4 F (36.3 C)-97.6 F (36.4 C)] 97.6 F (36.4 C) (09/27 0537) Pulse Rate:  [60-76] 60 (09/27 0537) Resp:  [7-21] 21 (09/27 0537) BP: (162-190)/(69-81) 185/81 (09/27 0537) SpO2:  [96 %-100 %] 100 % (09/27 0537) Last BM Date: 06/09/21  Intake/Output from previous day: 09/26 0701 - 09/27 0700 In: 3077.2 [P.O.:750; I.V.:2150.3; IV Piggyback:176.9] Out: 1225 [Urine:900; Drains:325] Intake/Output this shift: No intake/output data recorded.  PE: Gen:  Alert, NAD, pleasant HEENT: EOM's intact, pupils equal and round Card:  RRR Pulm:  CTAB, no W/R/R, effort normal Abd: Soft, mild distension, left sided tenderness without peritonitis, +BS. Ostomy bag with liquid brown stool. Midline wound with bandage c/d.i. Niel Hummer SS MSK: no LE edema bilaterally. No calf tenderness to palpation bilaterally Psych: A&Ox3 Skin: no rashes noted, warm and dry    Lab Results:  Recent Labs    06/11/21 0814 06/13/21 0941  WBC 11.8* 10.8*  HGB 13.1 10.3*  HCT 41.2 32.6*  PLT 444* 457*    BMET Recent Labs    06/11/21 0814 06/13/21 0941  NA 137 139  K 4.7 3.8  CL 96* 97*  CO2 28 33*  GLUCOSE 132* 79  BUN 12 16  CREATININE 0.49 0.37*  CALCIUM 8.2* 8.1*    PT/INR No results for input(s): LABPROT, INR in the last 72 hours. CMP     Component Value Date/Time   NA 139 06/13/2021 0941   K 3.8 06/13/2021 0941   CL 97 (L) 06/13/2021 0941   CO2 33 (H) 06/13/2021 0941   GLUCOSE 79  06/13/2021 0941   BUN 16 06/13/2021 0941   CREATININE 0.37 (L) 06/13/2021 0941   CALCIUM 8.1 (L) 06/13/2021 0941   PROT 6.6 06/04/2021 0435   ALBUMIN 3.1 (L) 06/04/2021 0435   AST 16 06/04/2021 0435   ALT 11 06/04/2021 0435   ALKPHOS 97 06/04/2021 0435   BILITOT 1.0 06/04/2021 0435   GFRNONAA >60 06/13/2021 0941   GFRAA >60 10/02/2019 0445   Lipase     Component Value Date/Time   LIPASE 22 06/03/2021 0921    Studies/Results: No results found.  Anti-infectives: Anti-infectives (From admission, onward)    Start     Dose/Rate Route Frequency Ordered Stop   06/03/21 2000  piperacillin-tazobactam (ZOSYN) IVPB 3.375 g        3.375 g 12.5 mL/hr over 240 Minutes Intravenous Every 8 hours 06/03/21 1528     06/03/21 1330  piperacillin-tazobactam (ZOSYN) IVPB 3.375 g        3.375 g 100 mL/hr over 30 Minutes Intravenous  Once 06/03/21 1315 06/03/21 1527        Assessment/Plan POD 4 s/p Exploratory laparotomy with washout of intra-abdominal abscess, sigmoid colectomy, end colostomy, placement of intra-abdominal drain for Perforated sigmoid diverticulitis with pelvic abscess by Dr. Zenia Resides on 9/23 - CLD started yesterday but very little PO intake due to nausea. Continue clears today. Monitor nausea/emesis - may need NGT replaced - Cont abx. Cx's w/ Kleb  PNA, sensitives pending - PCA stopped yesterday. Okay for PO meds with IV for breakthrough for now and monitor diet tolerance - Cont drain, SS - foley out 9/26, voiding - BID WTD - Mobilize, PT/OT - Pulm toilet, wean O2 as tolerated - Path pending - WBC improving on labs yesterday - 10.8 (11.8), Hgb 10.3 (13.1). Continue to monitor - intermittent confusion - monitor  FEN - CLD, LR @ 100 mL/hr VTE - SCDs, Lovenox ID - Zosyn Foley - d/c 9/26, voiding  Hx Lupus on steroids   LOS: 11 days    Winferd Humphrey , [facility] 06/14/2021, 7:35 AM Please see Amion for pager number during day hours  7:00am-4:30pm

## 2021-06-14 NOTE — Evaluation (Signed)
Occupational Therapy Evaluation Patient Details Name: Brittany Hobbs MRN: [id number] DOB: December 03, 1960 Today's Date: 06/14/2021   History of Present Illness 60 yo female admitted with diverticulitis with perforation. S/P exl lap, washout, sigmoid colectomy, end colostomy, L stent placement 06/10/21. Recent R TKA 05/2021. H xof lupus, wrist fx, patella fx, COVID, panic attacks   Clinical Impression   Patient typically lives alone and reports since her recent knee replacement has been "90% back" with her ADL tasks, still using rolling walker. Currently patient limited by pain impacting overall endurance. Also has safety concerns pulling at IV and did not initially realize what she was pulling at needing redirection. Also letting go of walker during functional ambulation and transfers, needing min A for safety. Patient stating her sister can likely stay with her, if sister is able to provide at least initial 24/7 support can likely progress to D/C home, if not may need short term rehab before going home. Acute OT to follow.      Recommendations for follow up therapy are one component of a multi-disciplinary discharge planning process, led by the attending physician.  Recommendations may be updated based on patient status, additional functional criteria and insurance authorization.   Follow Up Recommendations  Home health OT;Supervision/Assistance - 24 hour;Other (comment) (if patient's sister can provide 24/7 supervision at D/C)    Equipment Recommendations  Other (comment) (possible LH AE)       Precautions / Restrictions Precautions Precautions: Fall Precaution Comments: abd sg: colostomy, jp drain; recent R TKA Restrictions Weight Bearing Restrictions: No      Mobility Bed Mobility               General bed mobility comments: in recliner    Transfers Overall transfer level: Needs assistance Equipment used: Rolling walker (2 wheeled) Transfers: Sit to/from Stand Sit to  Stand: Min assist         General transfer comment: cues for safe hand placement, light min A to power up to standing and for safety    Balance Overall balance assessment: Needs assistance Sitting-balance support: Feet supported Sitting balance-Leahy Scale: Fair     Standing balance support: Bilateral upper extremity supported;Single extremity supported Standing balance-Leahy Scale: Poor Standing balance comment: reliant on at least unilateral support                           ADL either performed or assessed with clinical judgement   ADL Overall ADL's : Needs assistance/impaired     Grooming: Set up;Sitting   Upper Body Bathing: Supervision/ safety;Set up;Sitting   Lower Body Bathing: Moderate assistance;Sitting/lateral leans;Sit to/from stand   Upper Body Dressing : Set up;Supervision/safety;Sitting   Lower Body Dressing: Maximal assistance;Sitting/lateral leans;Sit to/from stand Lower Body Dressing Details (indicate cue type and reason): due to abdominal discomfort, patient reports she was back to "90%" after knee replacement surgery with dressing/bathing/ADLs Toilet Transfer: Minimal assistance;Ambulation;RW;Regular Toilet;Grab bars;Cueing for safety;Cueing for sequencing Toilet Transfer Details (indicate cue type and reason): needing cues to back up to toilet before sitting, cue to pull on grab bar when standing Toileting- Clothing Manipulation and Hygiene: Set up;Supervision/safety;Sitting/lateral lean Toileting - Clothing Manipulation Details (indicate cue type and reason): to wash peri area in sitting     Functional mobility during ADLs: Minimal assistance;Rolling walker;Cueing for sequencing;Cueing for safety General ADL Comments: overall patient min A for functional ambulation for safety as patient does let go of walker at times needing cues to redirect,  also with limited activity tolerance due to pain      Pertinent Vitals/Pain Pain Assessment:  Faces Faces Pain Scale: Hurts little more Pain Location: "hurts when I pee" abdomen Pain Descriptors / Indicators: Grimacing;Discomfort Pain Intervention(s): Monitored during session     Hand Dominance Right      Communication Communication Communication: No difficulties   Cognition Arousal/Alertness: Awake/alert Behavior During Therapy: Impulsive Overall Cognitive Status: No family/caregiver present to determine baseline cognitive functioning Area of Impairment: Following commands;Safety/judgement                       Following Commands: Follows one step commands with increased time Safety/Judgement: Decreased awareness of safety     General Comments: patient pulling at IV stating "I asked for this to be fixed its pulling my skin" had to be directed to let go as patient pulling somewhat hard with OT fixing ace wrap around arm. patient then states "oh that's my IV?" also needing cues for safety with walker management   General Comments  patient saturating at 91-94% on room air after ambulation from bathroom. notified CNA left O2 off            Home Living Family/patient expects to be discharged to:: Private residence Living Arrangements: Alone Available Help at Discharge: Family Type of Home: House Home Access: Stairs to enter Technical brewer of Steps: 4+1 Entrance Stairs-Rails: Can reach both Home Layout: One level     Bathroom Shower/Tub: Walk-in shower;Tub only   Bathroom Toilet: Handicapped height     Home Equipment: Walker - standard;Cane - single point   Additional Comments: information taken from previous admission      Prior Functioning/Environment Level of Independence: Independent with assistive device(s)        Comments: using RW        OT Problem List: Pain;Decreased activity tolerance;Impaired balance (sitting and/or standing);Decreased safety awareness;Decreased cognition;Decreased knowledge of use of DME or AE;Decreased  knowledge of precautions      OT Treatment/Interventions: Self-care/ADL training;Energy conservation;DME and/or AE instruction;Therapeutic activities;Cognitive remediation/compensation;Patient/family education;Balance training    OT Goals(Current goals can be found in the care plan section) Acute Rehab OT Goals Patient Stated Goal: less pain OT Goal Formulation: With patient Time For Goal Achievement: 06/28/21 Potential to Achieve Goals: Good  OT Frequency: Min 2X/week    AM-PAC OT "6 Clicks" Daily Activity     Outcome Measure Help from another person eating meals?: None Help from another person taking care of personal grooming?: A Little Help from another person toileting, which includes using toliet, bedpan, or urinal?: A Little Help from another person bathing (including washing, rinsing, drying)?: A Lot Help from another person to put on and taking off regular upper body clothing?: A Little Help from another person to put on and taking off regular lower body clothing?: A Lot 6 Click Score: 17   End of Session Equipment Utilized During Treatment: Rolling walker Nurse Communication: Mobility status  Activity Tolerance: Patient limited by pain Patient left: in chair;with call bell/phone within reach;with chair alarm set  OT Visit Diagnosis: Unsteadiness on feet (R26.81);Other abnormalities of gait and mobility (R26.89);Pain Pain - part of body:  (abdomen)                Time: 4163-8453 OT Time Calculation (min): 18 min Charges:  OT General Charges $OT Visit: 1 Visit OT Evaluation $OT Eval Low Complexity: [place] OT OT pager: [place] 06/14/2021,  10:11 AM

## 2021-06-15 ENCOUNTER — Encounter (HOSPITAL_COMMUNITY): Payer: Self-pay | Admitting: Internal Medicine

## 2021-06-15 DIAGNOSIS — K5792 Diverticulitis of intestine, part unspecified, without perforation or abscess without bleeding: Secondary | ICD-10-CM | POA: Diagnosis not present

## 2021-06-15 LAB — CBC
HCT: 32.5 % — ABNORMAL LOW (ref 36.0–46.0)
Hemoglobin: 10.5 g/dL — ABNORMAL LOW (ref 12.0–15.0)
MCH: 27.3 pg (ref 26.0–34.0)
MCHC: 32.3 g/dL (ref 30.0–36.0)
MCV: 84.4 fL (ref 80.0–100.0)
Platelets: 527 10*3/uL — ABNORMAL HIGH (ref 150–400)
RBC: 3.85 MIL/uL — ABNORMAL LOW (ref 3.87–5.11)
RDW: 15.6 % — ABNORMAL HIGH (ref 11.5–15.5)
WBC: 10.6 10*3/uL — ABNORMAL HIGH (ref 4.0–10.5)
nRBC: 0 % (ref 0.0–0.2)

## 2021-06-15 MED ORDER — LORAZEPAM 0.5 MG PO TABS
0.5000 mg | ORAL_TABLET | Freq: Four times a day (QID) | ORAL | Status: DC | PRN
  Administered 2021-06-15 – 2021-06-17 (×4): 0.5 mg via ORAL
  Filled 2021-06-15 (×4): qty 1

## 2021-06-15 MED ORDER — ESCITALOPRAM OXALATE 20 MG PO TABS
20.0000 mg | ORAL_TABLET | Freq: Every day | ORAL | Status: DC
  Administered 2021-06-15 – 2021-06-18 (×4): 20 mg via ORAL
  Filled 2021-06-15 (×4): qty 1

## 2021-06-15 MED ORDER — GABAPENTIN 400 MG PO CAPS
800.0000 mg | ORAL_CAPSULE | Freq: Two times a day (BID) | ORAL | Status: DC
  Administered 2021-06-15 – 2021-06-18 (×7): 800 mg via ORAL
  Filled 2021-06-15 (×7): qty 2

## 2021-06-15 MED ORDER — METOPROLOL SUCCINATE ER 25 MG PO TB24
12.5000 mg | ORAL_TABLET | Freq: Every day | ORAL | Status: DC
  Administered 2021-06-15 – 2021-06-18 (×4): 12.5 mg via ORAL
  Filled 2021-06-15 (×4): qty 1

## 2021-06-15 MED ORDER — PREDNISONE 5 MG PO TABS
10.0000 mg | ORAL_TABLET | Freq: Every day | ORAL | Status: DC
  Administered 2021-06-15 – 2021-06-18 (×4): 10 mg via ORAL
  Filled 2021-06-15 (×4): qty 2

## 2021-06-15 NOTE — Progress Notes (Signed)
Physical Therapy Treatment Patient Details Name: Brittany Hobbs MRN: [id number] DOB: Mar 23, 1961 Today's Date: 06/15/2021   History of Present Illness 60 yo female admitted with diverticulitis with perforation. S/P exl lap, washout, sigmoid colectomy, end colostomy, L stent placement 06/10/21. Recent R TKA 05/2021. H xof lupus, wrist fx, patella fx, COVID, panic attacks    PT Comments    General Comments: AxO very pleasant and in a better mind set today. Assisted OOB to amb to bathroom then in hallway. General bed mobility comments: pt self able with increased time General transfer comment: self able with increased time with good use of B UE's to steady self.  Also assisted with a toilet transfer which pt was able to self perform peri care during standing safely. General Gait Details: assisted with amb to bathroom then a functional distance in hallway.  Light need for walker.  Good alternating gait. Assisted back to bed per pt request "more comfortable".   Recommendations for follow up therapy are one component of a multi-disciplinary discharge planning process, led by the attending physician.  Recommendations may be updated based on patient status, additional functional criteria and insurance authorization.  Follow Up Recommendations  Home health PT     Equipment Recommendations  None recommended by PT    Recommendations for Other Services       Precautions / Restrictions Precautions Precaution Comments: recent ABD surgery + colostomy + R JP Drain Restrictions Weight Bearing Restrictions: No Other Position/Activity Restrictions: WBAT     Mobility  Bed Mobility Overal bed mobility: Modified Independent       Supine to sit: Modified independent (Device/Increase time) Sit to supine: Modified independent (Device/Increase time)   General bed mobility comments: pt self able with increased time    Transfers Overall transfer level: Needs assistance Equipment used: Rolling  walker (2 wheeled);None Transfers: Sit to/from [facility] Sit to Stand: Supervision Stand pivot transfers: Supervision       General transfer comment: self able with increased time with good use of B UE's to steady self.  Also assisted with a toilet transfer which pt was able to self perform peri care during standing safely.  Ambulation/Gait Ambulation/Gait assistance: Supervision;Min guard Gait Distance (Feet): 115 Feet Assistive device: Rolling walker (2 wheeled) Gait Pattern/deviations: Step-to pattern;Decreased stance time - right Gait velocity: WNL   General Gait Details: assisted with amb to bathroom then a functional distance in hallway.  Light need for walker.  Good alternating gait.   Stairs             Wheelchair Mobility    Modified Rankin (Stroke Patients Only)       Balance                                            Cognition Arousal/Alertness: Awake/alert Behavior During Therapy: WFL for tasks assessed/performed Overall Cognitive Status: Within Functional Limits for tasks assessed                                 General Comments: AxO very pleasant and in a better mind set today.      Exercises      General Comments        Pertinent Vitals/Pain Pain Assessment: Faces Faces Pain Scale: Hurts little more Pain Location: ABD with activity  Pain Descriptors / Indicators: Grimacing;Discomfort Pain Intervention(s): Monitored during session;Repositioned;Patient requesting pain meds-RN notified    Home Living                      Prior Function            PT Goals (current goals can now be found in the care plan section) Progress towards PT goals: Progressing toward goals    Frequency    Min 3X/week      PT Plan Current plan remains appropriate    Co-evaluation              AM-PAC PT "6 Clicks" Mobility   Outcome Measure  Help needed turning from your back to your  side while in a flat bed without using bedrails?: A Little Help needed moving from lying on your back to sitting on the side of a flat bed without using bedrails?: A Little Help needed moving to and from a bed to a chair (including a wheelchair)?: A Little Help needed standing up from a chair using your arms (e.g., wheelchair or bedside chair)?: A Little Help needed to walk in hospital room?: A Little Help needed climbing 3-5 steps with a railing? : A Little 6 Click Score: 18    End of Session Equipment Utilized During Treatment: Gait belt Activity Tolerance: Patient tolerated treatment well Patient left: in bed;with call bell/phone within reach Nurse Communication: Mobility status;Patient requests pain meds PT Visit Diagnosis: Pain;Difficulty in walking, not elsewhere classified (R26.2)     Time: 4163-8453 PT Time Calculation (min) (ACUTE ONLY): 15 min  Charges:  $Gait Training: 8-22 mins                     {Jermisha Hoffart  PTA Acute  Rehabilitation [employer]      [phone] Office      [phone]

## 2021-06-15 NOTE — Consult Note (Signed)
[place] Nurse ostomy follow up Patient receiving care in [place]. Stoma type/location: LUQ colostomy Stomal assessment/size: dark, moist, productive Peristomal assessment: deferred until Thursday. Treatment options for stomal/peristomal skin: barrier ring. May need a convex pouch. Determination on that to be made tomorrow. Output: small amount of thin brown stool  Ostomy pouching: 1pc. flat Education provided:  Today, the patient emptied and expressed gas from her existing pouch. She also cut the opening for the pouch for tomorrow.  She expressly stated she wants to do all of the ostomy care herself, and not to try to teach her sister.  She has agreed to review the [employer] in the education folder I provided her today, and to perform the pouch change tomorrow with my guidance. Enrolled patient in [place] Discharge program: Yes, today. Val Riles, RN, MSN, CWOCN, CNS-BC, pager [phone]

## 2021-06-15 NOTE — Progress Notes (Signed)
[place]  XTK:[id number] DOB: [birth year] DOA: 06/03/2021 PCP: Berkley Harvey, NP   Brief Narrative: Brittany Hobbs is a 60 y.o. female with a history of SLE on prednisone 10mg , anxiety, and recent right TKA who presented to the ED 9/16 with abdominal pain found to have sigmoid diverticulitis with pneumoperitoneum. IV antibiotics were given though the patient clinically worsened requiring sigmoid colectomy, end colostomy, and drain placement 06/10/2021.   Assessment & Plan: Acute sigmoid diverticulitis with perforation: s/p ex-lap/washout, sigmoid colectomy, end colostomy, drain placement by Dr. Zenia Resides 9/23. Nonmalignant pathology report. - Ceftriaxone, flagyl (started 9/27) continued per surgery based on K. pneumoniae in operative culture that is resistant to zosyn (given 9/16 - 9/27). WBC down overall, afebrile, nontoxic.  - Diet per surgery, to full liquids today.  Acute hypoxic respiratory failure: No documented hypoxia as far as I can tell. Pt denies dyspnea. No known Dx sleep apnea.  - Nocturnal O2 monitoring - Check ambulatory pulse oximetry and wean O2 off if tolerated, SpO2 >90%.  - CXR 9/27 personally reviewed reassuring showing normal pulmonary parenchyma without infiltrate, significant effusions, and normal heart borders/size.  - Continue incentive spirometry, OOB.  Anxiety:  - Continue home lorazepam, SSRI now that taking po  Neuropathy:  - Restart home gabapentin 800mg  BID (as reported by pt to me).   Restless legs:  - Some improvement with ropinirole started 9/27 which we can continue.  - Check iron stores  SLE: No evidence of acute flare.  - Convert back to home prednisone 10mg  daily.   HTN:  - Restart home metoprolol po. - Continue trending. If remains elevated, could consider norvasc 5mg , vs. increase metoprolol, though BP may come down after discharge and wouldn't want to precipitate orthostatic-related syncope/trauma.   s/p  R TKA: No DVT on venous U/S.  - Routine orthopedics follow up recommended.   Subjective: Abdominal pain is manageable. Legs remain somewhat restless but improved with the medications. Denies any cough, dyspnea, chest pain. Ready to advance to full liquids today.  Objective: Vitals:   06/14/21 0537 06/14/21 1305 06/14/21 2059 06/15/21 0616  BP: (!) 185/81 (!) 170/70 (!) 167/80 (!) 154/63  Pulse: 60 67 61 67  Resp: (!) 21 16 18 18   Temp: 97.6 F (36.4 C) 97.9 F (36.6 C) 97.8 F (36.6 C) 98.2 F (36.8 C)  TempSrc: Oral  Oral Oral  SpO2: 100% 92% 98% 98%  Weight:      Height:       Gen: 60 y.o. female in no distress Pulm: Non-labored breathing room air. Clear to auscultation bilaterally.  CV: Regular rate and rhythm. No murmur, rub, or gallop. No JVD, trace RLE pedal edema. GI: Abdomen soft, tender diffusely without guarding. +BS. JP drain in place with serosanguinous output, left colostomy with brown stool and gas in bag. Ext: Warm, no deformities Skin: No rashes, lesions or ulcers elsewhere Neuro: Alert and oriented. No focal neurological deficits. Psych: Judgement and insight appear normal. Mood & affect appropriate.    CBC: Recent Labs  Lab 06/09/21 0423 06/10/21 0411 06/11/21 0814 06/13/21 0941 06/15/21 0446  WBC 14.5* 14.0* 11.8* 10.8* 10.6*  NEUTROABS 12.1* 11.8* 10.3*  --   --   HGB 12.0 12.1 13.1 10.3* 10.5*  HCT 36.9 36.9 41.2 32.6* 32.5*  MCV 85.6 86.0 86.9 88.1 84.4  PLT 356 353 444* 457* 992*   Basic Metabolic Panel: Recent Labs  Lab 06/09/21 0423 06/10/21 0411 06/11/21 0814 06/13/21  0941  NA 138 141 137 139  K 3.4* 3.8 4.7 3.8  CL 99 100 96* 97*  CO2 30 29 28  33*  GLUCOSE 96 84 132* 79  BUN 12 10 12 16   CREATININE 0.52 0.54 0.49 0.37*  CALCIUM 8.3* 8.7* 8.2* 8.1*   Operative culture 9/23: GNR > K. pneumoniae. Resistant to amp, unasyn, zosyn, and ancef.  Covid-19 PCR: Negative 9/16 MRSA PCR: Negative 9/22    Radiology Studies: DG CHEST  PORT 1 VIEW  Result Date: 06/14/2021 CLINICAL DATA:  Acute respiratory failure with hypoxia EXAM: PORTABLE CHEST 1 VIEW COMPARISON:  06/03/2021 FINDINGS: Heart is normal size. No confluent airspace opacities or effusions. No acute bony abnormality. IMPRESSION: No active disease. Electronically Signed   By: Rolm Baptise M.D.   On: 06/14/2021 09:22    Scheduled Meds:  enoxaparin (LOVENOX) injection  40 mg Subcutaneous Q24H   metoprolol succinate  12.5 mg Oral Daily   predniSONE  10 mg Oral Q breakfast   Continuous Infusions:  cefTRIAXone (ROCEPHIN)  IV 2 g (06/14/21 1446)   lactated ringers 50 mL/hr at 06/15/21 1030   metronidazole 500 mg (06/14/21 2109)     LOS: 12 days   Time spent: 25 minutes.  Patrecia Pour, MD [facility] [email or web address] 06/15/2021, 10:56 AM

## 2021-06-15 NOTE — TOC Initial Note (Addendum)
Transition of Care [facility]) - Initial/Assessment Note    Patient Details  Name: Brittany Hobbs MRN: [id number] Date of Birth: 1961-03-04  Transition of Care [facility]) CM/SW Contact:    Lennart Pall, LCSW Phone Number: 06/16/2021, 10:12 AM  Clinical Narrative:                 Arranging hhc through [facility] whom she is already associated with.  Expected Discharge Plan: [place] Barriers to Discharge: No Barriers Identified   Patient Goals and CMS Choice Patient states their goals for this hospitalization and ongoing recovery are:: to go home CMS Medicare.gov Compare Post Acute Care list provided to:: Patient Choice offered to / list presented to : Patient  Expected Discharge Plan and Services Expected Discharge Plan: [place]   Discharge Planning Services: CM Consult Post Acute Care Choice: [place] arrangements for the past 2 months: Single Family Home Expected Discharge Date:  (unknown)                         HH Arranged: RN, PT HH Agency: [facility] [place]: 06/15/21 Time HH Agency Contacted: 1143 Representative spoke with at [place]: [place] Arrangements/Services Living arrangements for the past 2 months: [place] with:: Self Patient language and need for interpreter reviewed:: Yes Do you feel safe going back to the place where you live?: Yes            Criminal Activity/Legal Involvement Pertinent to Current Situation/Hospitalization: No - Comment as needed  Activities of Daily Living Home Assistive Devices/Equipment: Eyeglasses, Dentures (specify type), Walker (specify type) (upper/lower dentures, front wheeled walker) ADL Screening (condition at time of admission) Patient's cognitive ability adequate to safely complete daily activities?: Yes Is the patient deaf or have difficulty hearing?: No Does the patient have difficulty seeing, even when wearing  glasses/contacts?: No Does the patient have difficulty concentrating, remembering, or making decisions?: No Patient able to express need for assistance with ADLs?: Yes Does the patient have difficulty dressing or bathing?: No Independently performs ADLs?: Yes (appropriate for developmental age) Does the patient have difficulty walking or climbing stairs?: Yes (recent right knee replacement) Weakness of Legs: Right Weakness of Arms/Hands: None  Permission Sought/Granted                  Emotional Assessment Appearance:: Appears stated age     Orientation: : Oriented to Self, Oriented to Place, Oriented to  Time, Oriented to Situation Alcohol / Substance Use: Not Applicable Psych Involvement: No (comment)  Admission diagnosis:  Diverticulitis [K57.92] Dyspnea [R06.00] Acute diverticulitis [K57.92] Patient Active Problem List   Diagnosis Date Noted   Status post right knee replacement 05/20/2021   Unilateral primary osteoarthritis, right knee 05/19/2021   Effusion of right knee 02/03/2021   Acquired trigger finger of right ring finger 10/26/2020   Acute diverticulitis 09/30/2019   E coli bacteremia 09/30/2019   Thrombocytopenia ([place]) 09/30/2019   AKI (acute kidney injury) ([place]) 09/30/2019   LFT elevation 09/30/2019   Bacteriuria 08/11/2018   Hydronephrosis, left 08/11/2018   Diverticulitis 08/10/2018   Colonic diverticular abscess 08/10/2018   Lupus ([place]) 08/10/2018   Hyperlipidemia 08/10/2018   HTN (hypertension) 08/10/2018   Anxiety 08/10/2018   PCP:  Berkley Harvey, NP Pharmacy:   [id number] - [place], [place] [address] [place] [address] Phone: [phone]  Fax: [phone]     Social Determinants of Health (SDOH) Interventions    Readmission Risk Interventions Readmission Risk Prevention Plan 06/06/2021  Post Dischage Appt Not Complete  Appt Comments patient will amke follow appointment with her md  Medication Screening  Complete  Transportation Screening Complete  Some recent data might be hidden

## 2021-06-15 NOTE — Progress Notes (Signed)
5 Days Post-Op  Subjective:  No further nausea with clears and pain is improving. She ambulated yesterday and is in good spirits this am. Complains of restless legs improved with medication. No SHOB on supplemental O2  Objective: Vital signs in last 24 hours: Temp:  [97.8 F (36.6 C)-98.2 F (36.8 C)] 98.2 F (36.8 C) (09/28 0616) Pulse Rate:  [61-67] 67 (09/28 0616) Resp:  [16-18] 18 (09/28 0616) BP: (154-170)/(63-80) 154/63 (09/28 0616) SpO2:  [92 %-98 %] 98 % (09/28 0616) Last BM Date: 06/14/21  Intake/Output from previous day: 09/27 0701 - 09/28 0700 In: 2555.3 [P.O.:360; I.V.:1838.2; IV Piggyback:357.1] Out: 5155 [Urine:4550; Drains:605] Intake/Output this shift: No intake/output data recorded.  PE: Gen:  Alert, NAD, pleasant HEENT: EOM's intact, pupils equal and round Card:  RRR Pulm:  CTAB, no W/R/R, effort normal Abd: Soft, mild distension, left sided tenderness without peritonitis, +BS. Ostomy bag with liquid brown stool. Midline wound with bandage c/d.i. Niel Hummer SS MSK: no LE edema bilaterally. No calf tenderness to palpation bilaterally Psych: A&Ox3 Skin: no rashes noted, warm and dry    Lab Results:  Recent Labs    06/13/21 0941 06/15/21 0446  WBC 10.8* 10.6*  HGB 10.3* 10.5*  HCT 32.6* 32.5*  PLT 457* 527*    BMET Recent Labs    06/13/21 0941  NA 139  K 3.8  CL 97*  CO2 33*  GLUCOSE 79  BUN 16  CREATININE 0.37*  CALCIUM 8.1*    PT/INR No results for input(s): LABPROT, INR in the last 72 hours. CMP     Component Value Date/Time   NA 139 06/13/2021 0941   K 3.8 06/13/2021 0941   CL 97 (L) 06/13/2021 0941   CO2 33 (H) 06/13/2021 0941   GLUCOSE 79 06/13/2021 0941   BUN 16 06/13/2021 0941   CREATININE 0.37 (L) 06/13/2021 0941   CALCIUM 8.1 (L) 06/13/2021 0941   PROT 6.6 06/04/2021 0435   ALBUMIN 3.1 (L) 06/04/2021 0435   AST 16 06/04/2021 0435   ALT 11 06/04/2021 0435   ALKPHOS 97 06/04/2021 0435   BILITOT 1.0 06/04/2021 0435    GFRNONAA >60 06/13/2021 0941   GFRAA >60 10/02/2019 0445   Lipase     Component Value Date/Time   LIPASE 22 06/03/2021 0921    Studies/Results: DG CHEST PORT 1 VIEW  Result Date: 06/14/2021 CLINICAL DATA:  Acute respiratory failure with hypoxia EXAM: PORTABLE CHEST 1 VIEW COMPARISON:  06/03/2021 FINDINGS: Heart is normal size. No confluent airspace opacities or effusions. No acute bony abnormality. IMPRESSION: No active disease. Electronically Signed   By: Rolm Baptise M.D.   On: 06/14/2021 09:22    Anti-infectives: Anti-infectives (From admission, onward)    Start     Dose/Rate Route Frequency Ordered Stop   06/14/21 1430  metroNIDAZOLE (FLAGYL) IVPB 500 mg        500 mg 100 mL/hr over 60 Minutes Intravenous Every 12 hours 06/14/21 1307     06/14/21 1400  cefTRIAXone (ROCEPHIN) 2 g in sodium chloride 0.9 % 100 mL IVPB        2 g 200 mL/hr over 30 Minutes Intravenous Every 24 hours 06/14/21 1307     06/03/21 2000  piperacillin-tazobactam (ZOSYN) IVPB 3.375 g  Status:  Discontinued        3.375 g 12.5 mL/hr over 240 Minutes Intravenous Every 8 hours 06/03/21 1528 06/14/21 1307   06/03/21 1330  piperacillin-tazobactam (ZOSYN) IVPB 3.375 g  3.375 g 100 mL/hr over 30 Minutes Intravenous  Once 06/03/21 1315 06/03/21 1527        Assessment/Plan POD 5 s/p Exploratory laparotomy with washout of intra-abdominal abscess, sigmoid colectomy, end colostomy, placement of intra-abdominal drain for Perforated sigmoid diverticulitis with pelvic abscess by Dr. Zenia Resides on 9/23 - tolerated clear liquids. Advance to fulls - Cont abx. Cx's w/ Kleb PNA, resistant to zosyn. Sensitive to ceftriaxone - PCA stopped 9/26. Okay for PO meds with IV for breakthrough - Cont drain, SS. 605 ml output - BID WTD midline wound - Mobilize, PT/OT - Pulm toilet, wean O2 as tolerated - Path with diverticulitis - WBC improving - 10.6 (10.8), Hgb 10.5 (10.3). Continue to monitor - intermittent confusion -  monitor  FEN - FLD, dec IVF VTE - SCDs, Lovenox ID - Zosyn 9/16 >9/27, ceftriaxone/flagyl 9/27>> Foley - d/c 9/26, voiding  Hx Lupus on steroids Possible OSA Restless legs - ropinirole per TRH  Dispo: OT reccs HH/24H supervision. Her sister will be living with her after discharge. Consult to CM for possible HH   LOS: 12 days    Winferd Humphrey , [facility] Surgery 06/15/2021, 7:57 AM Please see Amion for pager number during day hours 7:00am-4:30pm

## 2021-06-16 DIAGNOSIS — K5792 Diverticulitis of intestine, part unspecified, without perforation or abscess without bleeding: Secondary | ICD-10-CM | POA: Diagnosis not present

## 2021-06-16 LAB — CBC
HCT: 31.1 % — ABNORMAL LOW (ref 36.0–46.0)
Hemoglobin: 10.1 g/dL — ABNORMAL LOW (ref 12.0–15.0)
MCH: 27.4 pg (ref 26.0–34.0)
MCHC: 32.5 g/dL (ref 30.0–36.0)
MCV: 84.3 fL (ref 80.0–100.0)
Platelets: 537 10*3/uL — ABNORMAL HIGH (ref 150–400)
RBC: 3.69 MIL/uL — ABNORMAL LOW (ref 3.87–5.11)
RDW: 15.8 % — ABNORMAL HIGH (ref 11.5–15.5)
WBC: 12.8 10*3/uL — ABNORMAL HIGH (ref 4.0–10.5)
nRBC: 0 % (ref 0.0–0.2)

## 2021-06-16 LAB — BASIC METABOLIC PANEL
Anion gap: 8 (ref 5–15)
BUN: 7 mg/dL (ref 6–20)
CO2: 30 mmol/L (ref 22–32)
Calcium: 8.1 mg/dL — ABNORMAL LOW (ref 8.9–10.3)
Chloride: 100 mmol/L (ref 98–111)
Creatinine, Ser: 0.44 mg/dL (ref 0.44–1.00)
GFR, Estimated: >60 mL/min (ref >60)
Glucose, Bld: 101 mg/dL — ABNORMAL HIGH (ref 70–99)
Potassium: 3.3 mmol/L — ABNORMAL LOW (ref 3.5–5.1)
Sodium: 138 mmol/L (ref 135–145)

## 2021-06-16 LAB — FERRITIN: Ferritin: 139 ng/mL (ref 11–307)

## 2021-06-16 MED ORDER — POTASSIUM CHLORIDE CRYS ER 20 MEQ PO TBCR
40.0000 meq | EXTENDED_RELEASE_TABLET | Freq: Once | ORAL | Status: AC
  Administered 2021-06-16: 40 meq via ORAL
  Filled 2021-06-16: qty 2

## 2021-06-16 MED ORDER — SODIUM CHLORIDE 0.9 % IV SOLN: 100.0000 mg | INTRAVENOUS | Status: DC

## 2021-06-16 MED ORDER — FLUCONAZOLE 100 MG PO TABS
800.0000 mg | ORAL_TABLET | Freq: Every day | ORAL | Status: DC
  Administered 2021-06-17 – 2021-06-18 (×2): 800 mg via ORAL
  Filled 2021-06-16 (×2): qty 8

## 2021-06-16 MED ORDER — ANIDULAFUNGIN 100 MG IV SOLR
200.0000 mg | INTRAVENOUS | Status: AC
  Administered 2021-06-16: 200 mg via INTRAVENOUS
  Filled 2021-06-16: qty 200

## 2021-06-16 NOTE — Progress Notes (Signed)
6 Days Post-Op  Subjective: Tolerated fulls yesterday and still without nausea or emesis. Continues to have bowel function. Ambulating well and no shortness of breath on room air. Pain well controlled. In good spirits  Objective: Vital signs in last 24 hours: Temp:  [97.5 F (36.4 C)-99.4 F (37.4 C)] 99.4 F (37.4 C) (09/29 0636) Pulse Rate:  [68-104] 104 (09/29 0636) Resp:  [18] 18 (09/29 0636) BP: (136-153)/(55-92) 136/92 (09/29 0636) SpO2:  [90 %-97 %] 97 % (09/29 0636) Last BM Date: 06/15/21  Intake/Output from previous day: 09/28 0701 - 09/29 0700 In: 1679.4 [P.O.:480; I.V.:899.4; IV Piggyback:300] Out: 2650 [Urine:2500; Drains:150] Intake/Output this shift: No intake/output data recorded.  PE: Gen:  Alert, NAD, pleasant HEENT: EOM's intact, pupils equal and round Card:  RRR Pulm:  CTAB, no W/R/R, effort normal on room air Abd: Soft, mild distension, very mild left sided tenderness without peritonitis, +BS. Ostomy bag with liquid brown stool. Midline wound with bandage c/d.i. Brittany Hobbs SS MSK: no LE edema bilaterally. No calf tenderness to palpation bilaterally Psych: A&Ox3 Skin: no rashes noted, warm and dry    Lab Results:  Recent Labs    06/15/21 0446 06/16/21 0410  WBC 10.6* 12.8*  HGB 10.5* 10.1*  HCT 32.5* 31.1*  PLT 527* 537*    BMET Recent Labs    06/13/21 0941 06/16/21 0410  NA 139 138  K 3.8 3.3*  CL 97* 100  CO2 33* 30  GLUCOSE 79 101*  BUN 16 7  CREATININE 0.37* 0.44  CALCIUM 8.1* 8.1*    PT/INR No results for input(s): LABPROT, INR in the last 72 hours. CMP     Component Value Date/Time   NA 138 06/16/2021 0410   K 3.3 (L) 06/16/2021 0410   CL 100 06/16/2021 0410   CO2 30 06/16/2021 0410   GLUCOSE 101 (H) 06/16/2021 0410   BUN 7 06/16/2021 0410   CREATININE 0.44 06/16/2021 0410   CALCIUM 8.1 (L) 06/16/2021 0410   PROT 6.6 06/04/2021 0435   ALBUMIN 3.1 (L) 06/04/2021 0435   AST 16 06/04/2021 0435   ALT 11 06/04/2021 0435    ALKPHOS 97 06/04/2021 0435   BILITOT 1.0 06/04/2021 0435   GFRNONAA >60 06/16/2021 0410   GFRAA >60 10/02/2019 0445   Lipase     Component Value Date/Time   LIPASE 22 06/03/2021 0921    Studies/Results: No results found.  Anti-infectives: Anti-infectives (From admission, onward)    Start     Dose/Rate Route Frequency Ordered Stop   06/14/21 1430  metroNIDAZOLE (FLAGYL) IVPB 500 mg        500 mg 100 mL/hr over 60 Minutes Intravenous Every 12 hours 06/14/21 1307     06/14/21 1400  cefTRIAXone (ROCEPHIN) 2 g in sodium chloride 0.9 % 100 mL IVPB        2 g 200 mL/hr over 30 Minutes Intravenous Every 24 hours 06/14/21 1307     06/03/21 2000  piperacillin-tazobactam (ZOSYN) IVPB 3.375 g  Status:  Discontinued        3.375 g 12.5 mL/hr over 240 Minutes Intravenous Every 8 hours 06/03/21 1528 06/14/21 1307   06/03/21 1330  piperacillin-tazobactam (ZOSYN) IVPB 3.375 g        3.375 g 100 mL/hr over 30 Minutes Intravenous  Once 06/03/21 1315 06/03/21 1527        Assessment/Plan POD 6 s/p Exploratory laparotomy with washout of intra-abdominal abscess, sigmoid colectomy, end colostomy, placement of intra-abdominal drain for Perforated sigmoid diverticulitis  with pelvic abscess by Dr. Zenia Resides on 9/23 - tolerated clear liquids. Advance to fulls - Cont abx. Cx's w/ Kleb PNA and candida glabrata, resistant to zosyn. Sensitive to ceftriaxone. Start eraxis - PCA stopped 9/26. Okay for PO meds with IV for breakthrough - Cont drain, SS. 150 ml output - BID WTD midline wound - Mobilize, PT/OT - Pulm toilet, now on room air - Path with diverticulitis - WBC improving - 12.8 (10.6), Hgb 10.1 (10.5). Continue to monitor - intermittent confusion - resolved  FEN - soft, IVF, replete K VTE - SCDs, Lovenox ID - Zosyn 9/16 >9/27, ceftriaxone/flagyl 9/27>>, eraxis 9/29> Foley - d/c 9/26, voiding  Hx Lupus on steroids Possible OSA Restless legs - ropinirole per TRH  Dispo: OT reccs HH/24H  supervision. Her sister will be living with her after discharge. Consult to CM for [facility]   LOS: 13 days    Winferd Humphrey , [facility] Surgery 06/16/2021, 9:10 AM Please see Amion for pager number during day hours 7:00am-4:30pm

## 2021-06-16 NOTE — Progress Notes (Addendum)
Brittany Hobbs  SAY:[id number] DOB: September 24, 1960 DOA: 06/03/2021 PCP: Berkley Harvey, NP   Brief Narrative: Brittany Hobbs is a 60 y.o. female with a history of SLE on prednisone 10mg , anxiety, and recent right TKA who presented to the ED 9/16 with abdominal pain found to have sigmoid diverticulitis with pneumoperitoneum. IV antibiotics were given though the patient clinically worsened requiring sigmoid colectomy, end colostomy, and drain placement 06/10/2021.   Assessment & Plan: Acute sigmoid diverticulitis with perforation: s/p ex-lap/washout, sigmoid colectomy, end colostomy, drain placement by Dr. Zenia Resides 9/23. Nonmalignant pathology report. - Ceftriaxone, flagyl (started 9/27) continued per surgery based on K. pneumoniae in operative culture that is resistant to zosyn (given 9/16 - 9/27). Pt appears clinically stabilizing though leukocytosis remains and Tmax this AM 99.66F. No evidence of autoimmune flare at this time. This is <36 hours from effective abx therapy and pt again appears stable clinically without symptoms of infectious nidus, so will not pursue repeat cultures or further work up at this time.   - Diet per surgery, tolerated full liquids.   Acute hypoxic respiratory failure: No documented hypoxia as far as I can tell. Pt denies dyspnea. No known Dx sleep apnea. CXR 9/27 personally reviewed reassuring showing normal pulmonary parenchyma without infiltrate, significant effusions, and normal heart borders/size.  - Weaned from oxygen without directed therapy. Suggest sleep study as outpatient. - Continue incentive spirometry, OOB.  Hypokalemia:  - Supplement  Anxiety:  - Continue home lorazepam, SSRI   Neuropathy:  - Restarted home gabapentin 800mg  BID    Restless legs: Ferritin wnl. - Some improvement with ropinirole started 9/27 which we can continue.   SLE: No evidence of acute flare.  - Continue home prednisone 10mg  daily.   HTN:  -  Restart home metoprolol po. - Continue trending. If remains elevated, could consider norvasc 5mg , vs. increase metoprolol, though BP may come down after discharge and wouldn't want to precipitate orthostatic-related syncope/trauma.   s/p R TKA: No DVT on venous U/S.  - Routine orthopedics follow up recommended.   Subjective: Tolerated full liquids without increased abdominal pain, nausea or vomiting. Still having brown stool out of bag, eager to learn and perform tasks regarding ostomy. No subjective fever, chills or feeling ill. Denied cough, dyspnea, dysuria.  Objective: Vitals:   06/15/21 0616 06/15/21 1258 06/15/21 2028 06/16/21 0636  BP: (!) 154/63 (!) 137/55 (!) 153/71 (!) 136/92  Pulse: 67 68 70 (!) 104  Resp: 18 18 18 18   Temp: 98.2 F (36.8 C) (!) 97.5 F (36.4 C) 98.4 F (36.9 C) 99.4 F (37.4 C)  TempSrc: Oral  Oral Oral  SpO2: 98% 95% 90% 97%  Weight:      Height:       Gen: 60 y.o. female in no distress Pulm: Nonlabored breathing room air. Clear. CV: Regular rate and rhythm. No murmur, rub, or gallop. No JVD, no dependent edema. GI: Abdomen soft, appropriately tender, drain still w/serosanguinous output, ostomy appears without exudate or increased tenderness or induration. Normoactive bowel sounds.  Ext: Warm, no deformities Skin: No new rashes, lesions or ulcers on visualized skin. Neuro: Alert and oriented. No focal neurological deficits. Psych: Judgement and insight appear fair. Mood euthymic & affect congruent. Behavior is appropriate.    CBC: Recent Labs  Lab 06/10/21 0411 06/11/21 0814 06/13/21 0941 06/15/21 0446 06/16/21 0410  WBC 14.0* 11.8* 10.8* 10.6* 12.8*  NEUTROABS 11.8* 10.3*  --   --   --  HGB 12.1 13.1 10.3* 10.5* 10.1*  HCT 36.9 41.2 32.6* 32.5* 31.1*  MCV 86.0 86.9 88.1 84.4 84.3  PLT 353 444* 457* 527* 264*   Basic Metabolic Panel: Recent Labs  Lab 06/10/21 0411 06/11/21 0814 06/13/21 0941 06/16/21 0410  NA 141 137 139 138  K  3.8 4.7 3.8 3.3*  CL 100 96* 97* 100  CO2 29 28 33* 30  GLUCOSE 84 132* 79 101*  BUN 10 12 16 7   CREATININE 0.54 0.49 0.37* 0.44  CALCIUM 8.7* 8.2* 8.1* 8.1*   Operative culture 9/23: GNR > K. pneumoniae. Resistant to amp, unasyn, zosyn, and ancef.  Covid-19 PCR: Negative 9/16 MRSA PCR: Negative 9/22    Radiology Studies: DG CHEST PORT 1 VIEW  Result Date: 06/14/2021 CLINICAL DATA:  Acute respiratory failure with hypoxia EXAM: PORTABLE CHEST 1 VIEW COMPARISON:  06/03/2021 FINDINGS: Heart is normal size. No confluent airspace opacities or effusions. No acute bony abnormality. IMPRESSION: No active disease. Electronically Signed   By: Rolm Baptise M.D.   On: 06/14/2021 09:22    Scheduled Meds:  enoxaparin (LOVENOX) injection  40 mg Subcutaneous Q24H   escitalopram  20 mg Oral Daily   gabapentin  800 mg Oral BID   metoprolol succinate  12.5 mg Oral Daily   potassium chloride  40 mEq Oral Once   predniSONE  10 mg Oral Q breakfast   Continuous Infusions:  cefTRIAXone (ROCEPHIN)  IV 2 g (06/15/21 1312)   lactated ringers 50 mL/hr at 06/16/21 0615   metronidazole Stopped (06/15/21 2318)     LOS: 13 days   Time spent: 25 minutes.  Patrecia Pour, MD [facility] [email or web address] 06/16/2021, 8:17 AM

## 2021-06-16 NOTE — Progress Notes (Signed)
Mobility Specialist - Progress Note    06/16/21 1020  Mobility  Activity Ambulated in hall  Level of Assistance Contact guard assist, steadying assist  Assistive Device Front wheel walker  Distance Ambulated (ft) 210 ft  Mobility Ambulated with assistance in hallway  Mobility Response Tolerated well  Mobility performed by Mobility specialist  $Mobility charge 1 Mobility   Upon entry pt required steadying assist to transfer from bed to [facility]. Once finished, pt was agreeable to ambulate and used RW to walk ~210 ft in hallway. Pt did not c/o of pain, SOB, or dizziness. She returned to room after session and was left in bed with call bell at side and RN in room.  [place] Specialist Acute Rehabilitation Services Phone: [phone] 06/16/21, 10:23 AM

## 2021-06-16 NOTE — Consult Note (Signed)
[place] Nurse ostomy follow up Patient receiving care in [place]. Stoma type/location: LUQ colostomy Stomal assessment/size: slightly oval 1 3/8 inches, moist, sutures intact, producing thin brown effluent Peristomal assessment: intact Treatment options for stomal/peristomal skin: barrier ring Output: emptied approximately 10 ml out of existing pouch Ostomy pouching: 1pc. Flat with barrier ring. Education provided:  Patient performed entire emptying, removal, peristomal cleaning, application of barrier ring and pouch with guidance only.  It was all "hands on" with her performing the steps.  Additional pouches and barrier rings in room.  At conclusion of the process, I explained she has "graduated" from the teaching. She had reviewed the [facility] education materials I provided her yesterday and was ready to go when I arrived this morning.  The patient is a pleasure to work with.  Enrolled patient in [place] Start Discharge program: Yes, 9/28. Val Riles, RN, MSN, CWOCN, CNS-BC, pager [phone]

## 2021-06-17 ENCOUNTER — Inpatient Hospital Stay (HOSPITAL_COMMUNITY): Payer: BC Managed Care – PPO

## 2021-06-17 ENCOUNTER — Other Ambulatory Visit (HOSPITAL_COMMUNITY): Payer: Self-pay

## 2021-06-17 DIAGNOSIS — K651 Peritoneal abscess: Secondary | ICD-10-CM | POA: Diagnosis not present

## 2021-06-17 DIAGNOSIS — M329 Systemic lupus erythematosus, unspecified: Secondary | ICD-10-CM

## 2021-06-17 DIAGNOSIS — K5792 Diverticulitis of intestine, part unspecified, without perforation or abscess without bleeding: Secondary | ICD-10-CM | POA: Diagnosis not present

## 2021-06-17 LAB — CBC
HCT: 31.5 % — ABNORMAL LOW (ref 36.0–46.0)
Hemoglobin: 10.2 g/dL — ABNORMAL LOW (ref 12.0–15.0)
MCH: 27.9 pg (ref 26.0–34.0)
MCHC: 32.4 g/dL (ref 30.0–36.0)
MCV: 86.1 fL (ref 80.0–100.0)
Platelets: 476 10*3/uL — ABNORMAL HIGH (ref 150–400)
RBC: 3.66 MIL/uL — ABNORMAL LOW (ref 3.87–5.11)
RDW: 16.3 % — ABNORMAL HIGH (ref 11.5–15.5)
WBC: 10.4 10*3/uL (ref 4.0–10.5)
nRBC: 0 % (ref 0.0–0.2)

## 2021-06-17 LAB — BASIC METABOLIC PANEL
Anion gap: 9 (ref 5–15)
BUN: 7 mg/dL (ref 6–20)
CO2: 28 mmol/L (ref 22–32)
Calcium: 8.5 mg/dL — ABNORMAL LOW (ref 8.9–10.3)
Chloride: 101 mmol/L (ref 98–111)
Creatinine, Ser: 0.53 mg/dL (ref 0.44–1.00)
GFR, Estimated: >60 mL/min (ref >60)
Glucose, Bld: 104 mg/dL — ABNORMAL HIGH (ref 70–99)
Potassium: 3.4 mmol/L — ABNORMAL LOW (ref 3.5–5.1)
Sodium: 138 mmol/L (ref 135–145)

## 2021-06-17 IMAGING — CT CT ABD-PELV W/O CM
2 of 4 series · 16 of 46 positions shown, 18 images · non-contrast
Comparison: CT abdomen and pelvis [DATE].

CLINICAL DATA: Evaluate for intra-abdominal abscess. Status post
diverticulitis with perforation. Status post exploratory laparotomy
with washout in sigmoid colectomy with end colostomy. Stent placed
[DATE].

EXAM:
CT ABDOMEN AND PELVIS WITHOUT CONTRAST
TECHNIQUE: Multidetector CT imaging of the abdomen and pelvis was performed
following the standard protocol without IV contrast.

[Series 3: axial st · axial · 0.71mm/px · z∈[+1098,+1463]mm · 13 of 83 slices shown, 15 images]
[im 5/83  soft-tissue]
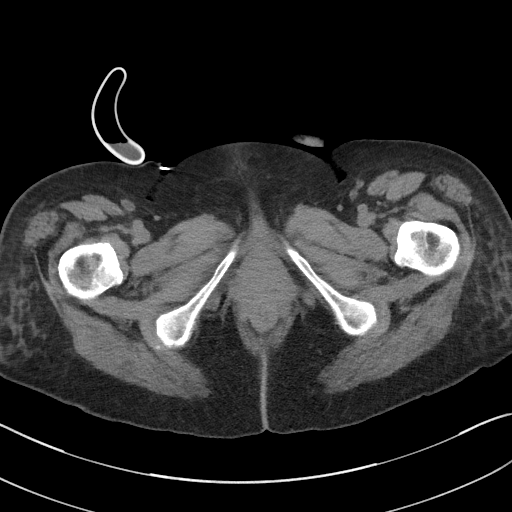
[im 5/83  bone]
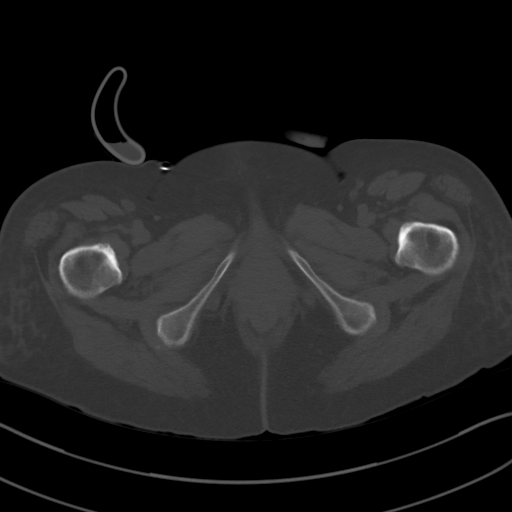
[im 13/83  soft-tissue]
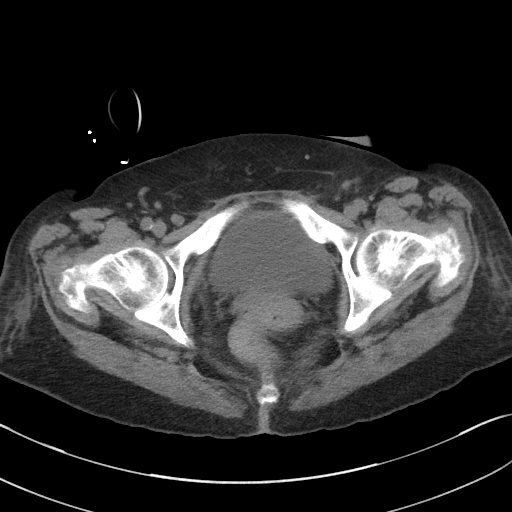
[im 18/83  soft-tissue]
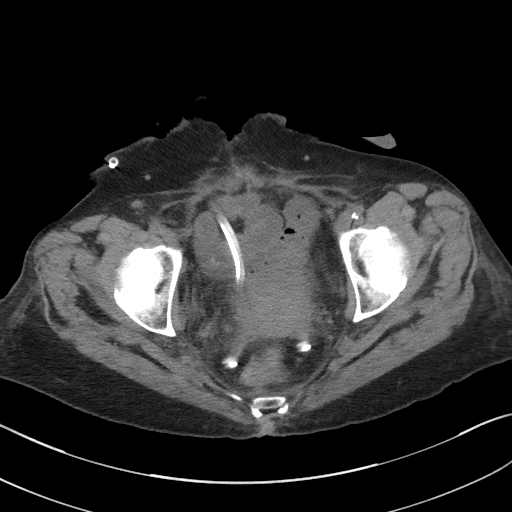
[im 22/83  soft-tissue]
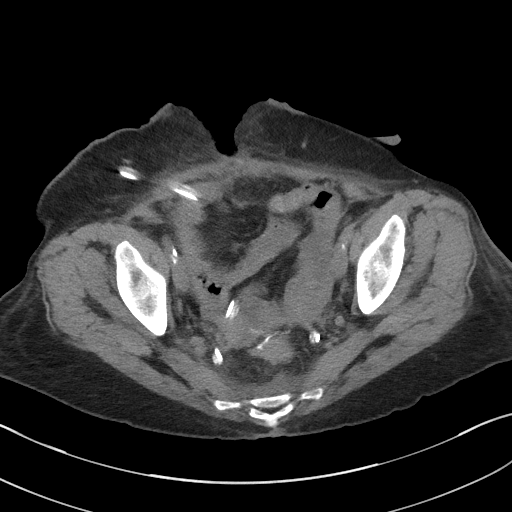
[im 31/83  soft-tissue]
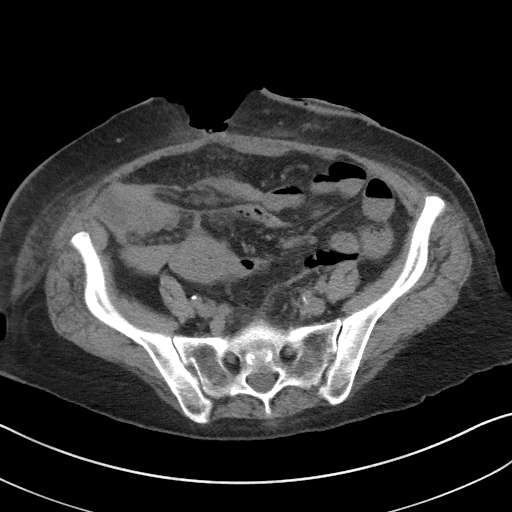
[im 35/83  soft-tissue]
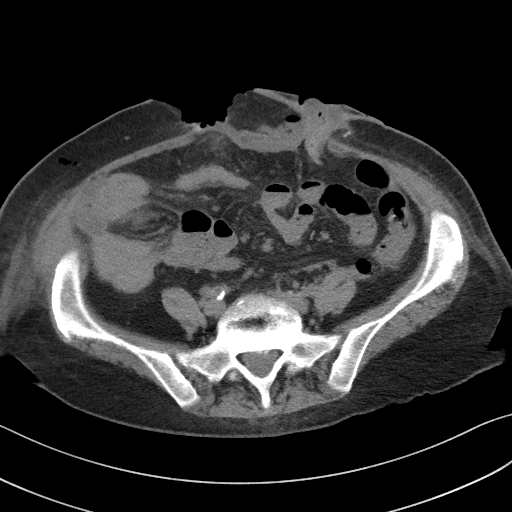
[im 44/83  soft-tissue]
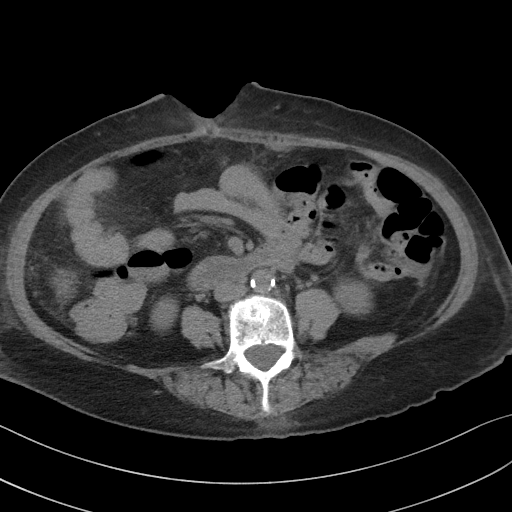
[im 48/83  soft-tissue]
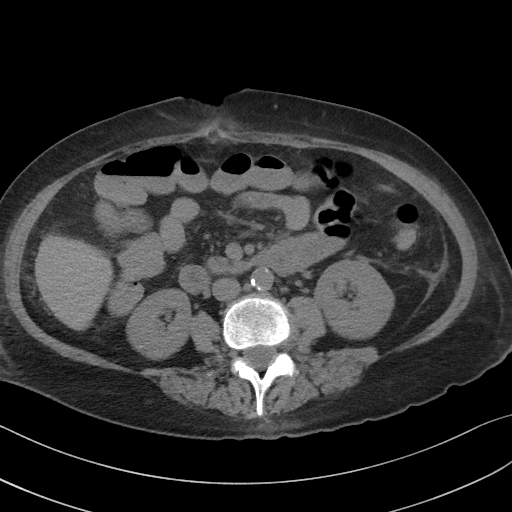
[im 52/83  soft-tissue]
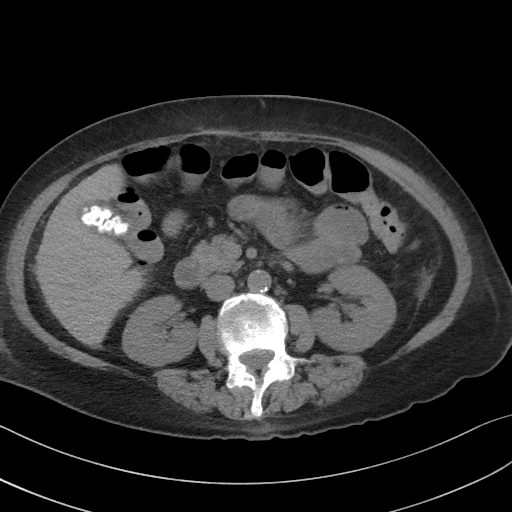
[im 52/83  bone]
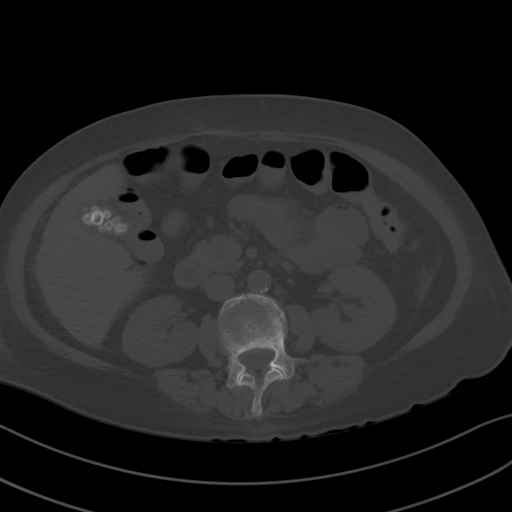
[im 61/83  soft-tissue]
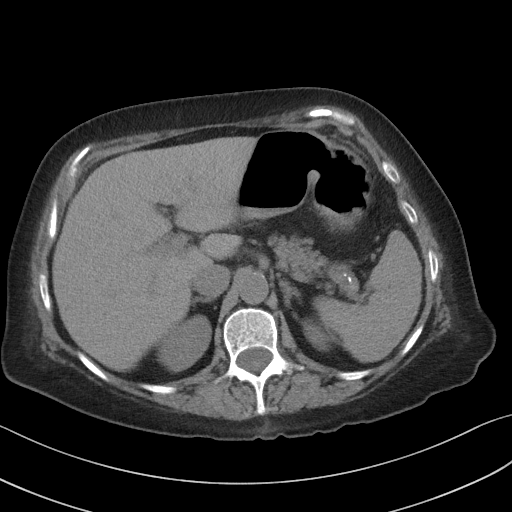
[im 65/83  soft-tissue]
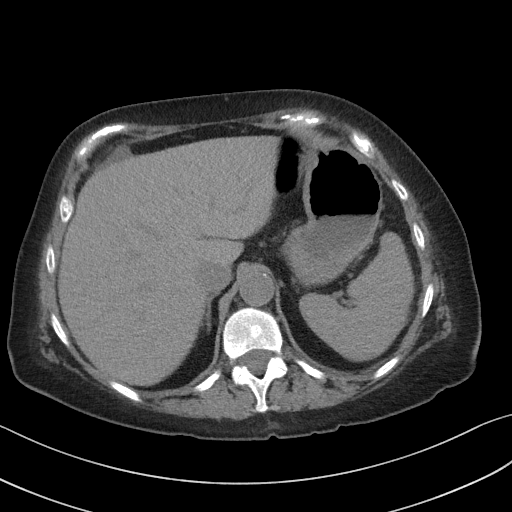
[im 70/83  soft-tissue]
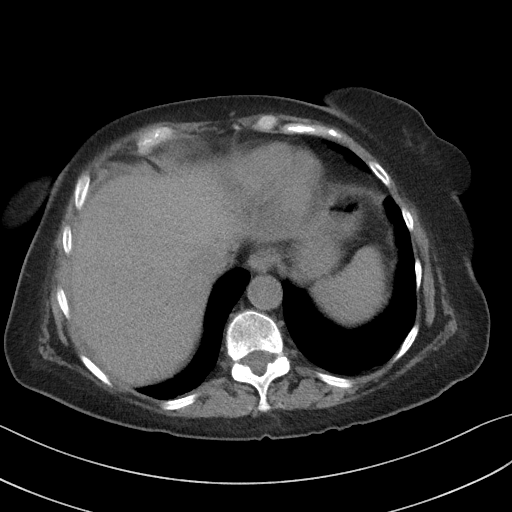
[im 78/83  soft-tissue]
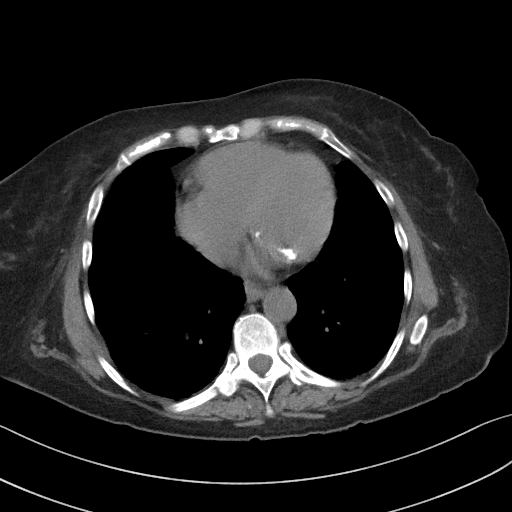

[Series 6: coronal st · coronal · 0.75mm/px · 3 of 86 slices shown]
[im 29/86  soft-tissue]
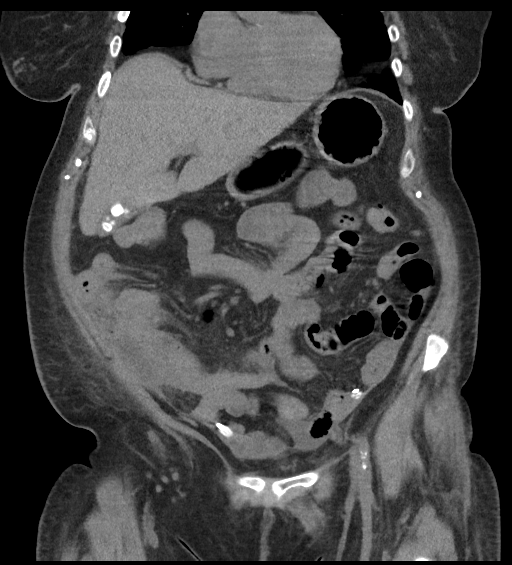
[im 38/86  soft-tissue]
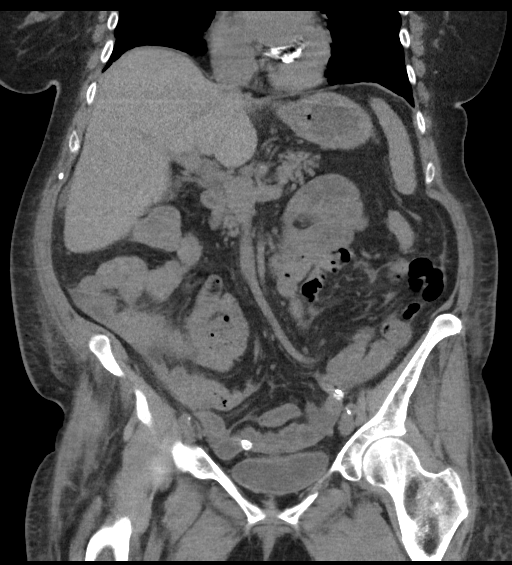
[im 48/86  soft-tissue]
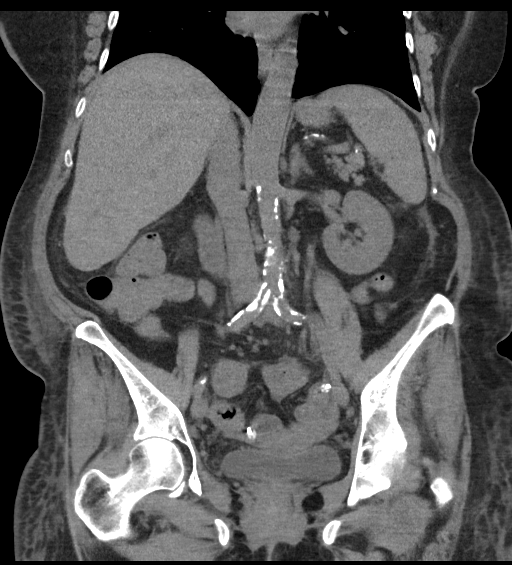

[16 of 46 positions shown; findings below may reference images not displayed]

FINDINGS: Lower chest: There is minimal atelectasis or scarring in the lung
bases.

Hepatobiliary: Multiple gallstones are again seen. There is no
biliary ductal dilatation. Bile ducts are within normal limits.

Pancreas: Unremarkable. No pancreatic ductal dilatation or
surrounding inflammatory changes.

Spleen: Normal in size without focal abnormality.

Adrenals/Urinary Tract: Adrenal glands are unremarkable. Kidneys are
normal, without renal calculi, focal lesion, or hydronephrosis.
Bladder is unremarkable.

Stomach/Bowel: Patient is status post sigmoidectomy with left lower
quadrant colostomy. There is no evidence for bowel obstruction.
There is no free intraperitoneal air. The appendix is within normal
limits. There are small bowel loops in the right abdomen with
thickening with associated mesenteric edema. Stomach is within
normal limits.

Vascular/Lymphatic: Aortic atherosclerosis. No enlarged abdominal or
pelvic lymph nodes.

Reproductive: Uterus and bilateral adnexa are unremarkable.

Other: There is mild presacral edema. There is no ascites. There are
no obvious fluid collections allowing for lack of intravenous and
oral contrast. There is a new midline abdominal wall wound
compatible with recent surgery. Percutaneous drainage catheter ends
in the left lower quadrant. There is a small air-fluid collection in
the subcutaneous tissues just medial to the ostomy. This collection
measures 1.4 x 1.2 by 1.0 cm. There is no peristomal hernia.

Musculoskeletal: Compression deformities of T12, L2, L3 and L4 are
stable. Multilevel degenerative changes affect the spine.
IMPRESSION: 1. Status post sigmoid colectomy with left lower quadrant colostomy.
No bowel obstruction.
2. Small bowel loops in the right abdomen with wall thickening and
mesenteric edema worrisome for nonspecific enteritis.
3. Small subcutaneous air-fluid collection adjacent to the ostomy.
Correlate for infection.
4. No definite intra-abdominal abscess identified allowing for lack
of oral and intravenous contrast.
5. Cholelithiasis.
6.  Aortic Atherosclerosis ([IR]-[IR]).

## 2021-06-17 MED ORDER — POTASSIUM CHLORIDE CRYS ER 20 MEQ PO TBCR
40.0000 meq | EXTENDED_RELEASE_TABLET | Freq: Once | ORAL | Status: AC
  Administered 2021-06-17: 40 meq via ORAL
  Filled 2021-06-17: qty 2

## 2021-06-17 MED ORDER — AMLODIPINE BESYLATE 5 MG PO TABS
5.0000 mg | ORAL_TABLET | Freq: Every day | ORAL | Status: DC
  Administered 2021-06-17 – 2021-06-18 (×2): 5 mg via ORAL
  Filled 2021-06-17 (×2): qty 1

## 2021-06-17 NOTE — Progress Notes (Signed)
7 Days Post-Op  Subjective:  Did well with ostomy teaching yesterday and participated in her midline dressing change. Tolerating soft diet without nausea or emesis. Good bowel function. ambulating  Objective: Vital signs in last 24 hours: Temp:  [98 F (36.7 C)-98.5 F (36.9 C)] 98.1 F (36.7 C) (09/30 0529) Pulse Rate:  [63-70] 70 (09/30 0529) Resp:  [18] 18 (09/30 0529) BP: (128-178)/(58-73) 175/61 (09/30 0529) SpO2:  [87 %-98 %] 87 % (09/30 0529) Last BM Date: 06/17/21  Intake/Output from previous day: 09/29 0701 - 09/30 0700 In: 2076.7 [P.O.:960; I.V.:816.7; IV Piggyback:300] Out: 510 [Urine:400; Drains:110] Intake/Output this shift: No intake/output data recorded.  PE: Gen:  Alert, NAD, pleasant HEENT: EOM's intact, pupils equal and round Card:  RRR Pulm:  CTAB, no W/R/R, effort normal on room air Abd: Soft, mild distension, very mild left sided tenderness without peritonitis, +BS. Ostomy bag with liquid brown stool. Midline wound with bandage c/d.I - removed and replaced for exam. Drain SS MSK: no LE edema bilaterally. No calf tenderness to palpation bilaterally Psych: A&Ox3 Skin: no rashes noted, warm and dry     Lab Results:  Recent Labs    06/16/21 0410 06/17/21 0419  WBC 12.8* 10.4  HGB 10.1* 10.2*  HCT 31.1* 31.5*  PLT 537* 476*    BMET Recent Labs    06/16/21 0410 06/17/21 0419  NA 138 138  K 3.3* 3.4*  CL 100 101  CO2 30 28  GLUCOSE 101* 104*  BUN 7 7  CREATININE 0.44 0.53  CALCIUM 8.1* 8.5*    PT/INR No results for input(s): LABPROT, INR in the last 72 hours. CMP     Component Value Date/Time   NA 138 06/17/2021 0419   K 3.4 (L) 06/17/2021 0419   CL 101 06/17/2021 0419   CO2 28 06/17/2021 0419   GLUCOSE 104 (H) 06/17/2021 0419   BUN 7 06/17/2021 0419   CREATININE 0.53 06/17/2021 0419   CALCIUM 8.5 (L) 06/17/2021 0419   PROT 6.6 06/04/2021 0435   ALBUMIN 3.1 (L) 06/04/2021 0435   AST 16 06/04/2021 0435   ALT 11  06/04/2021 0435   ALKPHOS 97 06/04/2021 0435   BILITOT 1.0 06/04/2021 0435   GFRNONAA >60 06/17/2021 0419   GFRAA >60 10/02/2019 0445   Lipase     Component Value Date/Time   LIPASE 22 06/03/2021 0921    Studies/Results: No results found.  Anti-infectives: Anti-infectives (From admission, onward)    Start     Dose/Rate Route Frequency Ordered Stop   06/17/21 1200  anidulafungin (ERAXIS) 100 mg in sodium chloride 0.9 % 100 mL IVPB  Status:  Discontinued       See Hyperspace for full Linked Orders Report.   100 mg 78 mL/hr over 100 Minutes Intravenous Every 24 hours 06/16/21 1242 06/16/21 1439   06/17/21 1000  fluconazole (DIFLUCAN) tablet 800 mg        800 mg Oral Daily 06/16/21 1439     06/16/21 1330  anidulafungin (ERAXIS) 200 mg in sodium chloride 0.9 % 200 mL IVPB       See Hyperspace for full Linked Orders Report.   200 mg 78 mL/hr over 200 Minutes Intravenous Every 24 hours 06/16/21 1242 06/16/21 1727   06/14/21 1430  metroNIDAZOLE (FLAGYL) IVPB 500 mg        500 mg 100 mL/hr over 60 Minutes Intravenous Every 12 hours 06/14/21 1307     06/14/21 1400  cefTRIAXone (ROCEPHIN) 2 g in sodium  chloride 0.9 % 100 mL IVPB        2 g 200 mL/hr over 30 Minutes Intravenous Every 24 hours 06/14/21 1307     06/03/21 2000  piperacillin-tazobactam (ZOSYN) IVPB 3.375 g  Status:  Discontinued        3.375 g 12.5 mL/hr over 240 Minutes Intravenous Every 8 hours 06/03/21 1528 06/14/21 1307   06/03/21 1330  piperacillin-tazobactam (ZOSYN) IVPB 3.375 g        3.375 g 100 mL/hr over 30 Minutes Intravenous  Once 06/03/21 1315 06/03/21 1527        Assessment/Plan POD 7 s/p Exploratory laparotomy with washout of intra-abdominal abscess, sigmoid colectomy, end colostomy, placement of intra-abdominal drain for Perforated sigmoid diverticulitis with pelvic abscess by Dr. Zenia Resides on 9/23 - Cont abx. Cx's w/ Kleb PNA and candida glabrata, resistant to zosyn. Sensitive to ceftriaxone. Received  dose of eraxis 9/29 and will continue treatment with high dose diflucan - PCA stopped 9/26. Okay for PO meds with IV for breakthrough - Cont drain, SS. 110 ml output - BID WTD midline wound - Mobilize, PT/OT - Pulm toilet, now on room air - Path with diverticulitis - WBC improving - 10.4 (12.8), Hgb 10.2 (10.1). Continue to monitor - intermittent confusion - resolved - tolerating soft diet  FEN - soft, IVF, replete K VTE - SCDs, Lovenox ID - Zosyn 9/16 >9/27, ceftriaxone/flagyl 9/27>>, eraxis 9/29, diflucan 9/29>> Foley - d/c 9/26, voiding  Hx Lupus on steroids Possible OSA - sleep study outpatient Restless legs - ropinirole per TRH  Dispo: nearing readiness for discharge soon. OT reccs HH/24H supervision. Her sister will be living with her after discharge. Consult to CM for [facility]   LOS: 14 days    Winferd Humphrey , [facility] Surgery 06/17/2021, 8:27 AM Please see Amion for pager number during day hours 7:00am-4:30pm

## 2021-06-17 NOTE — Progress Notes (Signed)
Occupational Therapy Treatment Patient Details Name: Brittany Hobbs MRN: [id number] DOB: 1960-12-17 Today's Date: 06/17/2021   History of present illness 60 yo female admitted with diverticulitis with perforation. S/P exl lap, washout, sigmoid colectomy, end colostomy, L stent placement 06/10/21. Recent R TKA 05/2021. H xof lupus, wrist fx, patella fx, COVID, panic attacks   OT comments  Patient reports she is independent with toileting and mobility in room. Rn reports she just needs assistance with unplugging IV pole. Patient demonstrated ability to don socks and perform toileting. Patient reports she is independent with colostomy care as well. Patient reports abdomen is sore and wanting some kind of support and asked therapist about an abdominal binder. Therapist did not recommend abdominal binder due to colostomy but recommended high rise legging or biker short to provide some compression for the lower abdomen. Therapist showed patient colostomy in mirror and point out that any item over the colostomy could impede it's function or pop the seal. Patient verbalized understanding. Patient will have sister at home to assist her for a few days. Patient did not exhibit any unsafe behaviors - she is quirky but cognitively sound. Patient has met OT goals and has no further OT needs.   Recommendations for follow up therapy are one component of a multi-disciplinary discharge planning process, led by the attending physician.  Recommendations may be updated based on patient status, additional functional criteria and insurance authorization.    Follow Up Recommendations  No OT follow up    Equipment Recommendations  None recommended by OT    Recommendations for Other Services      Precautions / Restrictions Precautions Precautions: Fall Precaution Comments: recent ABD surgery + colostomy + R JP Drain Restrictions Weight Bearing Restrictions: No       Mobility Bed Mobility Overal bed mobility:  Modified Independent                  Transfers Overall transfer level: Modified independent                    Balance Overall balance assessment: Mild deficits observed, not formally tested                                         ADL either performed or assessed with clinical judgement   ADL Overall ADL's : Modified independent                                             Vision Patient Visual Report: No change from baseline            Cognition Arousal/Alertness: Awake/alert Behavior During Therapy: WFL for tasks assessed/performed Overall Cognitive Status: Within Functional Limits for tasks assessed                                                General Comments      Pertinent Vitals/ Pain       Pain Assessment: Faces Faces Pain Scale: Hurts a little bit Pain Location: ABD with activity Pain Descriptors / Indicators: Sore Pain Intervention(s): Monitored during session     Prior Functioning/Environment  Frequency           Progress Toward Goals  OT Goals(current goals can now be found in the care plan section)  Progress towards OT goals: Goals met and updated - see care plan     Plan Discharge plan needs to be updated;All goals met and education completed, patient discharged from OT services    Co-evaluation                 AM-PAC OT "6 Clicks" Daily Activity     Outcome Measure   Help from another person eating meals?: None Help from another person taking care of personal grooming?: None Help from another person toileting, which includes using toliet, bedpan, or urinal?: None Help from another person bathing (including washing, rinsing, drying)?: None Help from another person to put on and taking off regular upper body clothing?: None Help from another person to put on and taking off regular lower body clothing?: None 6 Click Score: 24    End of  Session    OT Visit Diagnosis: Unsteadiness on feet (R26.81);Other abnormalities of gait and mobility (R26.89);Pain   Activity Tolerance Patient tolerated treatment well   Patient Left in bed;with call bell/phone within reach   Nurse Communication Mobility status        Time: 1201-1216 OT Time Calculation (min): 15 min  Charges: OT General Charges $OT Visit: 1 Visit OT Treatments $Self Care/Home Management : 8-22 mins  Derl Barrow, OTR/L [place]  Office [phone] Pager: [place] 06/17/2021, 1:10 PM

## 2021-06-17 NOTE — TOC Benefit Eligibility Note (Signed)
Patient Teacher, English as a foreign language completed.    The patient is currently admitted and upon discharge could be taking Fluconazole 800 mg tab.  The current 14 day co-pay is, $0.00.   The patient is insured through [place], [place] Patient Advocate Specialist [place] Team Direct Number: [phone]  Fax: [phone]

## 2021-06-17 NOTE — TOC Transition Note (Signed)
Transition of Care [facility]) - CM/SW Discharge Note   Patient Details  Name: Brittany Hobbs MRN: [id number] Date of Birth: Jun 14, 1961  [facility] CM/SW Contact:  Lennart Pall, LCSW Phone Number: 06/17/2021, 2:37 PM   Clinical Narrative:    Pt anticipating possible dc today if medically cleared.  She is agreeable with [place] services restarted with [place] and addition of RN services (already active with PT).  No DME needs.  No further TOC needs.   Final next level of care: [place] Barriers to Discharge: No Barriers Identified   Patient Goals and CMS Choice Patient states their goals for this hospitalization and ongoing recovery are:: to go home CMS Medicare.gov Compare Post Acute Care list provided to:: Patient Choice offered to / list presented to : Patient  Discharge Placement                       Discharge Plan and Services   Discharge Planning Services: CM Consult Post Acute Care Choice: Home Health                    HH Arranged: RN, PT [facility] Agency: [place] Date [place]: 06/15/21 Time [place]: 1143 Representative spoke with at [place]: [place] ([place]) Interventions     Readmission Risk Interventions Readmission Risk Prevention Plan 06/06/2021  Post Dischage Appt Not Complete  Appt Comments patient will amke follow appointment with her md  Medication Screening Complete  Transportation Screening Complete  Some recent data might be hidden

## 2021-06-17 NOTE — Progress Notes (Signed)
[place]  INO:[id number] DOB: 05-31-1961 DOA: 06/03/2021 PCP: Berkley Harvey, NP   Brief Narrative: Brittany Hobbs is a 60 y.o. female with a history of SLE on prednisone 10mg , anxiety, and recent right TKA who presented to the ED 9/16 with abdominal pain found to have sigmoid diverticulitis with pneumoperitoneum. IV antibiotics were given though the patient clinically worsened requiring sigmoid colectomy, end colostomy, and drain placement 06/10/2021.   Assessment & Plan: Acute sigmoid diverticulitis with perforation: s/p ex-lap/washout, sigmoid colectomy, end colostomy, drain placement by Dr. Zenia Resides 9/23. Nonmalignant pathology report. - Ceftriaxone, flagyl (started 9/27) continued per surgery based on K. pneumoniae in operative culture that is resistant to zosyn (given 9/16 - 9/27).  - Eraxis added for C. glabrata as well in culture, transition to high dose fluconazole. I've asked for ID assistance regarding transition to oral options and duration.  - Diet per surgery, tolerated full liquids.   Acute hypoxic respiratory failure: No documented hypoxia as far as I can tell. Pt denies dyspnea. No known Dx sleep apnea. CXR 9/27 personally reviewed reassuring showing normal pulmonary parenchyma without infiltrate, significant effusions, and normal heart borders/size.  - Weaned from oxygen without directed therapy. Suggest sleep study as outpatient. May still require nocturnal oxygen. - Continue incentive spirometry, OOB.  Hypokalemia:  - Supplement again today, overall improving/stable.  Anxiety:  - Continue home lorazepam, SSRI   Neuropathy:  - Restarted home gabapentin 800mg  BID    Restless legs: Ferritin wnl. - Some improvement with ropinirole started 9/27 which we can continue.   SLE: No evidence of acute flare.  - Continue home prednisone 10mg  daily.   HTN:  - Restart home metoprolol po. - Continue trending. Add norvasc 5mg   s/p R TKA: No  DVT on venous U/S.  - Routine orthopedics follow up recommended.   Subjective: In high spirits as she's tolerating diet, having normal output, no fever, abd pain improved, ambulating.   Objective: Vitals:   06/16/21 1328 06/16/21 2117 06/17/21 0527 06/17/21 0529  BP: (!) 128/58 (!) 178/70 (!) 161/73 (!) 175/61  Pulse: 69 63 69 70  Resp: 18 18 18 18   Temp: 98 F (36.7 C) 98.5 F (36.9 C) 98 F (36.7 C) 98.1 F (36.7 C)  TempSrc: Oral Oral Oral Oral  SpO2: 93% 98% (!) 89% (!) 87%  Weight:      Height:       Gen: 60 y.o. female in no distress Pulm: Nonlabored breathing room air. Clear. CV: Regular rate and rhythm. No murmur, rub, or gallop. No JVD, no dependent edema. GI: Abdomen soft, minimally tender without rebound. Wound dressing c/d/I with serous DC from JP drain. Stoma without exudate, normal output noted. Normoactive bowel sounds.  Ext: Warm, no deformities Skin: No new rashes, lesions or ulcers on visualized skin. Neuro: Alert and oriented. No focal neurological deficits. Psych: Judgement and insight appear fair. Mood euthymic & affect congruent. Behavior is appropriate.    CBC: Recent Labs  Lab 06/11/21 0814 06/13/21 0941 06/15/21 0446 06/16/21 0410 06/17/21 0419  WBC 11.8* 10.8* 10.6* 12.8* 10.4  NEUTROABS 10.3*  --   --   --   --   HGB 13.1 10.3* 10.5* 10.1* 10.2*  HCT 41.2 32.6* 32.5* 31.1* 31.5*  MCV 86.9 88.1 84.4 84.3 86.1  PLT 444* 457* 527* 537* 096*   Basic Metabolic Panel: Recent Labs  Lab 06/11/21 0814 06/13/21 0941 06/16/21 0410 06/17/21 0419  NA 137 139 138  138  K 4.7 3.8 3.3* 3.4*  CL 96* 97* 100 101  CO2 28 33* 30 28  GLUCOSE 132* 79 101* 104*  BUN 12 16 7 7   CREATININE 0.49 0.37* 0.44 0.53  CALCIUM 8.2* 8.1* 8.1* 8.5*   Operative culture 9/23: GNR > K. pneumoniae. Resistant to amp, unasyn, zosyn, and ancef.  Covid-19 PCR: Negative 9/16 MRSA PCR: Negative 9/22   LOS: 14 days   Time spent: 25 minutes.  Patrecia Pour, MD [facility] [email or web address] 06/17/2021, 11:41 AM

## 2021-06-17 NOTE — Consult Note (Signed)
[facility] for Infectious Disease    Date of Admission:  06/03/2021     Reason for Consult: complicated diverticulitis/intraabd infection    Referring Provider: Bonner Puna   Lines:  Peripheral iv's  Abx: 9/27-c Ceftriaxone 9/30-c Fluconazole 9/27-c metronidazole   9/16-27 piptazo 9/29 anidulafungin       Assessment: Complicated diverticulitis/abd abscess s/p exlap with hartman's procedure   9/23 exlap abd fluid Cx c glabrata, kleb pna (piptazo resistance, ceftriaxone sensitive)  Clinically improving on ceftriaxone/flagyl/fluconazole   Will repeat abd ct and assess for any residual fluid collection. If no such process present, would presume exlap I&D provided good source control, and will plan for that case 2 week abx  Of note, we do not have candida glabrata susceptibility yet. It is being sent today and will take several days usually at least a week. It is reasonable to send her on oral fluconazole, but she might requires IV anidulafungin if the candida is not sensitive  I will arrange id clinic f/u to discuss with her  Plan: Continue ceftriaxone, metronidazole, and fluconazole  Abd/pelv ct If abd pelv ct shows no fluid collection, can discharge on 2 weeks of ciprofloxacin 500 mg po bid, metronidazole 500 mg po bid, and fluconazole 800 mg po daily until 10/12 If the ct showed fluid collection, please send her on 1 month supply of the above medication Id clinic f/u on 10/12 @ 345 We can f/u on the candida glabrata susceptibility outpatient Discussed with primary team    I spent 60 minute reviewing data/chart, and coordinating care and >50% direct face to face time providing counseling/discussing diagnostics/treatment plan with patient   ------------------------------------------------ Active Problems:   Acute diverticulitis    HPI: Brittany Hobbs is a 60 y.o. female hx lupus admitted 9/16 with abd pain found to have comoplicated diverticulitis,  now s/p hartman procedure  Patient presented with 1 day of sx On admission she had no fever but mild leukocytosis Abd ct shows complicated diverticulitis but no definied fluid collection No blood cx obtained  Due to persistent sx and mild-mod leukocytosis, a repeat ct done 9/21 and showed increased fluid collection. She was taken for exlap/hartman on 9/23. Cx showed kleb pna resistant to piptazo and c glabrata. She was placed on appropriate abx starting 9/27 with clinical improvement  Per primary team she is ready for disposition soon  She has ct contrast allergy  She is feeling well now and ready to go home. But willing to wait on the abd ct  For her lupus which appears per her description to be rash, she is on prednisone low dose  She had a recent right tka 2 weeks prior to admission, and that is fine. She broke her patella left knee distant past and had surgery, and that is fine as well  Family History  Problem Relation Age of Onset   Colon cancer Neg Hx    Stomach cancer Neg Hx    Esophageal cancer Neg Hx    Rectal cancer Neg Hx     Social History   Tobacco Use   Smoking status: Every Day    Packs/day: 0.30    Years: 30.00    Pack years: 9.00    Types: Cigarettes   Smokeless tobacco: Never   Tobacco comments:    Trying to quit  Vaping Use   Vaping Use: Never used  Substance Use Topics   Alcohol use: No   Drug use:  Yes    Frequency: 7.0 times per week    Types: Marijuana    Comment: CBD    Allergies  Allergen Reactions   Iodine Anaphylaxis    Pt states she is allergic to INTERNAL IODINE   Betadine [Povidone Iodine] Hives   Methocarbamol Nausea Only    Felt nauseated and sick   Sulfa Antibiotics     N/V    Review of Systems: ROS All Other ROS was negative, except mentioned above   Past Medical History:  Diagnosis Date   Arthritis    Asthma    Diverticulitis    Hyperlipidemia    Lupus (HCC)    Panic attacks    Pneumonia 09/2020   with covid    PONV (postoperative nausea and vomiting)    Seasonal allergies        Scheduled Meds:  amLODipine  5 mg Oral Daily   enoxaparin (LOVENOX) injection  40 mg Subcutaneous Q24H   escitalopram  20 mg Oral Daily   fluconazole  800 mg Oral Daily   gabapentin  800 mg Oral BID   metoprolol succinate  12.5 mg Oral Daily   predniSONE  10 mg Oral Q breakfast   Continuous Infusions:  cefTRIAXone (ROCEPHIN)  IV 2 g (06/16/21 1839)   lactated ringers 50 mL/hr at 06/16/21 2025   metronidazole 500 mg (06/17/21 1046)   PRN Meds:.acetaminophen, diphenhydrAMINE **OR** diphenhydrAMINE, labetalol, LORazepam, morphine injection, ondansetron **OR** ondansetron (ZOFRAN) IV, oxyCODONE, rOPINIRole   OBJECTIVE: Blood pressure (!) 175/61, pulse 70, temperature 98.1 F (36.7 C), temperature source Oral, resp. rate 18, height 5\' 5"  (1.651 m), weight 64 kg, SpO2 (!) 87 %.  Physical Exam  General/constitutional: no distress, pleasant HEENT: Normocephalic, PER, Conj Clear, EOMI, Oropharynx clear Neck supple CV: rrr no mrg Lungs: clear to auscultation, normal respiratory effort Abd: Soft, Nontender; wound healthy appearing no purulence/tenderness; the osteomy is functioning Ext: no edema Skin: No Rash Neuro: nonfocal MSK: no peripheral joint swelling/tenderness/warmth; back spines nontender; right knee incision healed; no swelling/warmth right knee     Lab Results Lab Results  Component Value Date   WBC 10.4 06/17/2021   HGB 10.2 (L) 06/17/2021   HCT 31.5 (L) 06/17/2021   MCV 86.1 06/17/2021   PLT 476 (H) 06/17/2021    Lab Results  Component Value Date   CREATININE 0.53 06/17/2021   BUN 7 06/17/2021   NA 138 06/17/2021   K 3.4 (L) 06/17/2021   CL 101 06/17/2021   CO2 28 06/17/2021    Lab Results  Component Value Date   ALT 11 06/04/2021   AST 16 06/04/2021   ALKPHOS 97 06/04/2021   BILITOT 1.0 06/04/2021      Microbiology: Recent Results (from the past 240 hour(s))  Surgical pcr  screen     Status: None   Collection Time: 06/09/21  2:12 PM   Specimen: Nasal Mucosa; Nasal Swab  Result Value Ref Range Status   MRSA, PCR NEGATIVE NEGATIVE Final   Staphylococcus aureus NEGATIVE NEGATIVE Final    Comment: (NOTE) The Xpert SA Assay (FDA approved for NASAL specimens in patients 67 years of age and older), is one component of a comprehensive surveillance program. It is not intended to diagnose infection nor to guide or monitor treatment. Performed at [facility], [place] [address] [place], [place] [address]   Aerobic/Anaerobic Culture w Gram Stain (surgical/deep wound)     Status: None   Collection Time: 06/10/21  4:07 PM   Specimen:  PATH Other; Tissue  Result Value Ref Range Status   Specimen Description   Final    ABSCESS PERITONEAL Performed at [facility] [address], [place], [place] [address]    Special Requests   Final    NONE Performed at [facility], [place] [address], [place], [place] [address]    Gram Stain   Final    FEW SQUAMOUS EPITHELIAL CELLS PRESENT FEW WBC SEEN FEW GRAM NEGATIVE RODS    Culture   Final    FEW KLEBSIELLA PNEUMONIAE FEW CANDIDA GLABRATA NO ANAEROBES ISOLATED Performed at [facility], [address], [place], [place] [address]    Report Status 06/15/2021 FINAL  Final   Organism ID, Bacteria KLEBSIELLA PNEUMONIAE  Final      Susceptibility   Klebsiella pneumoniae - MIC*    AMPICILLIN >=32 RESISTANT Resistant     CEFAZOLIN >=64 RESISTANT Resistant     CEFEPIME 1 SENSITIVE Sensitive     CEFTAZIDIME 4 SENSITIVE Sensitive     CEFTRIAXONE <=0.25 SENSITIVE Sensitive     CIPROFLOXACIN <=0.25 SENSITIVE Sensitive     GENTAMICIN <=1 SENSITIVE Sensitive     IMIPENEM <=0.25 SENSITIVE Sensitive     TRIMETH/SULFA <=20 SENSITIVE Sensitive     AMPICILLIN/SULBACTAM >=32 RESISTANT Resistant     PIP/TAZO >=128 RESISTANT Resistant     * FEW KLEBSIELLA PNEUMONIAE      Serology:    Imaging: If present, new imagings (plain films, ct scans, and mri) have been personally visualized and interpreted; radiology reports have been reviewed. Decision making incorporated into the Impression / Recommendations.  9/21 abd ct Acute sigmoid diverticulitis without definite evidence of perforation or abscess collection.   Fluid collections in the central pelvis may represent distended fluid-filled small bowel loops though it is difficult to completely exclude interloop collections/abscess; may consider follow-up CT imaging with IV contrast to better assess, if patient's renal function permits.   Small amount of nonspecific free intraperitoneal fluid.   Mild dilatation of small bowel loops question ileus with some of the small bowel loops in the LEFT abdomen showing mild wall thickening, which could reflect enteritis or secondary peritonitis.   Cholelithiasis.   Small RIGHT pleural effusion and subsegmental atelectasis RIGHT lower lobe.   Chronic interstitial lung disease changes at both lung bases.   Aortic Atherosclerosis    9/16 abd ct Moderate to severe sigmoid diverticulitis is noted with adjacent crescent-shaped air collection concerning for perforation. Also noted is mild pneumoperitoneum in the anterior portion of the central abdomen also suggesting perforation. Critical Value/emergent results were called by telephone at the time of interpretation on 06/03/2021 at 12:33 pm to provider Lacretia Leigh , who verbally acknowledged these results.   Cholelithiasis  Jabier Mutton, MD [facility] for Infectious [place] [phone] pager    06/17/2021, 1:09 PM

## 2021-06-18 DIAGNOSIS — K651 Peritoneal abscess: Secondary | ICD-10-CM

## 2021-06-18 MED ORDER — CIPROFLOXACIN HCL 500 MG PO TABS: 500.0000 mg | ORAL_TABLET | Freq: Two times a day (BID) | ORAL | 0 refills | Status: AC

## 2021-06-18 MED ORDER — FLUCONAZOLE 200 MG PO TABS: 800.0000 mg | ORAL_TABLET | Freq: Every day | ORAL | 0 refills | Status: AC

## 2021-06-18 MED ORDER — OXYCODONE HCL 5 MG PO TABS: 5.0000 mg | ORAL_TABLET | Freq: Four times a day (QID) | ORAL | 0 refills | Status: DC | PRN

## 2021-06-18 MED ORDER — METRONIDAZOLE 500 MG PO TABS: 500.0000 mg | ORAL_TABLET | Freq: Two times a day (BID) | ORAL | 0 refills | Status: AC

## 2021-06-18 NOTE — Progress Notes (Signed)
Nurse reviewed discharge instructions with pt.  Pt verbalized understanding of discharge instructions, follow up appointments and new medications.  JP drain removed and dressing changed prior to discharge.

## 2021-06-18 NOTE — Progress Notes (Signed)
    [facility] for Infectious Disease   Reason for visit: Follow up on intra abdominal abscess  Interval History: CT scan noted with no fluid collection/abscess except for a small subcutatnous air-fluid collection.  Some enteritis  Physical Exam: Constitutional:  Vitals:   06/17/21 2233 06/18/21 0540  BP: (!) 147/75 (!) 147/72  Pulse: 66 70  Resp: 18 16  Temp: 98.8 F (37.1 C) (!) 97.5 F (36.4 C)  SpO2: 94% 90%   patient appears in NAD  Impression: no abscess finding on CT and cultures with C glabrata, E coli, Klebsiella.    Plan: 1. Cipro 500 mg bid, metronidazole 500 mg bid and fluconazole 800 mg daily for 2 weeks Follow up arranged with Dr. Gale Journey on 10/12

## 2021-06-18 NOTE — Progress Notes (Signed)
Physical Therapy Treatment Patient Details Name: Brittany Hobbs MRN: [id number] DOB: 12-23-1960 Today's Date: 06/18/2021   History of Present Illness 60 yo female admitted with diverticulitis with perforation. S/P exl lap, washout, sigmoid colectomy, end colostomy, L stent placement 06/10/21. Recent R TKA 05/2021. H xof lupus, wrist fx, patella fx, COVID, panic attacks    PT Comments    Pt for DC home today. Reviewed log roll for safety with preventing abdominal strain. Reviewed with demo and with pt performing and sister watching. Also provided video to phones for them to view later through [place]. Also reviewed and answered questions about moving and bending, and feeding pets with precautions of lifting and abdominal straining. Reviewed R knee exercises and assessed pt ability to achieve full extension at 0 degrees to greater than 90 degrees in supine. Pt also with good strength in R knee after TKA, looks good. Still recommend HHPT to continue to R TKA and safety around home with new colostomy and navigation with movement.    EXERCISESPATIENT EDUCATIONTEMPLATES  Search exercises  search  [phone] exercises foundPosition: Supine to Standing  Access Code [facility]    Recommendations for follow up therapy are one component of a multi-disciplinary discharge planning process, led by the attending physician.  Recommendations may be updated based on patient status, additional functional criteria and insurance authorization.  Follow Up Recommendations  Home health PT                 PT Goals (current goals can now be found in the care plan section) Acute Rehab PT Goals Patient Stated Goal: less pain Time For Goal Achievement: 06/27/21 Potential to Achieve Goals: Good Progress towards PT goals: Progressing toward goals    Frequency    Min 3X/week      PT Plan Current plan remains appropriate    Co-evaluation              AM-PAC PT "6 Clicks" Mobility   Outcome  Measure  Help needed turning from your back to your side while in a flat bed without using bedrails?: A Little Help needed moving from lying on your back to sitting on the side of a flat bed without using bedrails?: A Little Help needed moving to and from a bed to a chair (including a wheelchair)?: A Little Help needed standing up from a chair using your arms (e.g., wheelchair or bedside chair)?: None Help needed to walk in hospital room?: None Help needed climbing 3-5 steps with a railing? : None 6 Click Score: 21    End of Session     Patient left: in bed;with call bell/phone within reach   PT Visit Diagnosis: Pain;Difficulty in walking, not elsewhere classified (R26.2)     Time: 1020-1045 PT Time Calculation (min) (ACUTE ONLY): 25 min  Charges:  $Therapeutic Activity: 23-37 mins                     Jeily Guthridge, PT, MPT Acute Rehabilitation Services Office: [phone] Pager: [phone] 06/18/2021    Clide Dales 06/18/2021, 11:15 AM

## 2021-06-18 NOTE — Progress Notes (Signed)
8 Days Post-Op   Subjective/Chief Complaint: Doing great, ct overnight no abscess (no contrast), wants to go home   Objective: Vital signs in last 24 hours: Temp:  [97.5 F (36.4 C)-98.8 F (37.1 C)] 97.5 F (36.4 C) (10/01 0540) Pulse Rate:  [66-80] 70 (10/01 0540) Resp:  [16-18] 16 (10/01 0540) BP: (147-158)/(72-80) 147/72 (10/01 0540) SpO2:  [90 %-94 %] 90 % (10/01 0540) Last BM Date: 06/17/21  Intake/Output from previous day: 09/30 0701 - 10/01 0700 In: 1452.2 [P.O.:720; I.V.:532.3; IV Piggyback:199.9] Out: 65 [Drains:65] Intake/Output this shift: No intake/output data recorded.  Gen:  Alert, NAD CV rrr Pulm:  clear bilaterally Abd: Soft, not distended nontender BS present. Ostomy bag with liquid brown stool. Midline wound open with some fascial dehsicence it appears Drain SS   Lab Results:  Recent Labs    06/16/21 0410 06/17/21 0419  WBC 12.8* 10.4  HGB 10.1* 10.2*  HCT 31.1* 31.5*  PLT 537* 476*   BMET Recent Labs    06/16/21 0410 06/17/21 0419  NA 138 138  K 3.3* 3.4*  CL 100 101  CO2 30 28  GLUCOSE 101* 104*  BUN 7 7  CREATININE 0.44 0.53  CALCIUM 8.1* 8.5*   PT/INR No results for input(s): LABPROT, INR in the last 72 hours. ABG No results for input(s): PHART, HCO3 in the last 72 hours.  Invalid input(s): PCO2, PO2  Studies/Results: CT ABDOMEN PELVIS WO CONTRAST  Result Date: 06/17/2021 CLINICAL DATA:  Evaluate for intra-abdominal abscess. Status post diverticulitis with perforation. Status post exploratory laparotomy with washout in sigmoid colectomy with end colostomy. Stent placed 06/10/2021. EXAM: CT ABDOMEN AND PELVIS WITHOUT CONTRAST TECHNIQUE: Multidetector CT imaging of the abdomen and pelvis was performed following the standard protocol without IV contrast. COMPARISON:  CT abdomen and pelvis 06/08/2021. FINDINGS: Lower chest: There is minimal atelectasis or scarring in the lung bases. Hepatobiliary: Multiple gallstones are again seen.  There is no biliary ductal dilatation. Bile ducts are within normal limits. Pancreas: Unremarkable. No pancreatic ductal dilatation or surrounding inflammatory changes. Spleen: Normal in size without focal abnormality. Adrenals/Urinary Tract: Adrenal glands are unremarkable. Kidneys are normal, without renal calculi, focal lesion, or hydronephrosis. Bladder is unremarkable. Stomach/Bowel: Patient is status post sigmoidectomy with left lower quadrant colostomy. There is no evidence for bowel obstruction. There is no free intraperitoneal air. The appendix is within normal limits. There are small bowel loops in the right abdomen with thickening with associated mesenteric edema. Stomach is within normal limits. Vascular/Lymphatic: Aortic atherosclerosis. No enlarged abdominal or pelvic lymph nodes. Reproductive: Uterus and bilateral adnexa are unremarkable. Other: There is mild presacral edema. There is no ascites. There are no obvious fluid collections allowing for lack of intravenous and oral contrast. There is a new midline abdominal wall wound compatible with recent surgery. Percutaneous drainage catheter ends in the left lower quadrant. There is a small air-fluid collection in the subcutaneous tissues just medial to the ostomy. This collection measures 1.4 x 1.2 by 1.0 cm. There is no peristomal hernia. Musculoskeletal: Compression deformities of T12, L2, L3 and L4 are stable. Multilevel degenerative changes affect the spine. IMPRESSION: 1. Status post sigmoid colectomy with left lower quadrant colostomy. No bowel obstruction. 2. Small bowel loops in the right abdomen with wall thickening and mesenteric edema worrisome for nonspecific enteritis. 3. Small subcutaneous air-fluid collection adjacent to the ostomy. Correlate for infection. 4. No definite intra-abdominal abscess identified allowing for lack of oral and intravenous contrast. 5. Cholelithiasis. 6.  Aortic Atherosclerosis (  ICD10-I70.0). Electronically  Signed   By: Ronney Asters M.D.   On: 06/17/2021 19:15    Anti-infectives: Anti-infectives (From admission, onward)    Start     Dose/Rate Route Frequency Ordered Stop   06/17/21 1200  anidulafungin (ERAXIS) 100 mg in sodium chloride 0.9 % 100 mL IVPB  Status:  Discontinued       See Hyperspace for full Linked Orders Report.   100 mg 78 mL/hr over 100 Minutes Intravenous Every 24 hours 06/16/21 1242 06/16/21 1439   06/17/21 1000  fluconazole (DIFLUCAN) tablet 800 mg        800 mg Oral Daily 06/16/21 1439     06/16/21 1330  anidulafungin (ERAXIS) 200 mg in sodium chloride 0.9 % 200 mL IVPB       See Hyperspace for full Linked Orders Report.   200 mg 78 mL/hr over 200 Minutes Intravenous Every 24 hours 06/16/21 1242 06/16/21 1727   06/14/21 1430  metroNIDAZOLE (FLAGYL) IVPB 500 mg        500 mg 100 mL/hr over 60 Minutes Intravenous Every 12 hours 06/14/21 1307     06/14/21 1400  cefTRIAXone (ROCEPHIN) 2 g in sodium chloride 0.9 % 100 mL IVPB        2 g 200 mL/hr over 30 Minutes Intravenous Every 24 hours 06/14/21 1307     06/03/21 2000  piperacillin-tazobactam (ZOSYN) IVPB 3.375 g  Status:  Discontinued        3.375 g 12.5 mL/hr over 240 Minutes Intravenous Every 8 hours 06/03/21 1528 06/14/21 1307   06/03/21 1330  piperacillin-tazobactam (ZOSYN) IVPB 3.375 g        3.375 g 100 mL/hr over 30 Minutes Intravenous  Once 06/03/21 1315 06/03/21 1527       Assessment/Plan: POD 8 s/p Exploratory laparotomy with washout of intra-abdominal abscess, sigmoid colectomy, end colostomy, placement of intra-abdominal drain for Perforated sigmoid diverticulitis with pelvic abscess by Dr. Zenia Resides on 9/23 - Cont abx. Cx's w/ Kleb PNA and candida glabrata, resistant to zosyn. Sensitive to ceftriaxone. Received dose of eraxis 9/29 and will continue treatment with high dose diflucan. Will follow ID recs after ct and dc home today on orals with ID follow up - dc drain today - BID WTD midline wound -  Mobilize, PT/OT - Pulm toilet - Path with diverticulitis - tolerating soft diet   FEN - soft VTE - SCDs, Lovenox ID - Zosyn 9/16 >9/27, ceftriaxone/flagyl 9/27>>, eraxis 9/29, diflucan 9/29>>   Hx Lupus on steroids Possible OSA - sleep study outpatient Restless legs - ropinirole per [facility]   Dispo: dc home    Rolm Bookbinder 06/18/2021

## 2021-06-19 ENCOUNTER — Inpatient Hospital Stay (HOSPITAL_COMMUNITY)
Admission: EM | Admit: 2021-06-19 | Discharge: 2021-06-21 | Disposition: A | Discharge status: Home / Self Care | Payer: BC Managed Care – PPO | Source: Home / Self Care

## 2021-06-19 ENCOUNTER — Encounter (HOSPITAL_COMMUNITY): Admission: EM | Disposition: A | Discharge status: Home / Self Care | Payer: Self-pay | Source: Home / Self Care

## 2021-06-19 ENCOUNTER — Encounter (HOSPITAL_COMMUNITY): Payer: Self-pay

## 2021-06-19 ENCOUNTER — Other Ambulatory Visit: Payer: Self-pay

## 2021-06-19 ENCOUNTER — Emergency Department (HOSPITAL_COMMUNITY): Payer: BC Managed Care – PPO | Admitting: Anesthesiology

## 2021-06-19 ENCOUNTER — Emergency Department (HOSPITAL_COMMUNITY): Payer: BC Managed Care – PPO

## 2021-06-19 DIAGNOSIS — G2581 Restless legs syndrome: Secondary | ICD-10-CM | POA: Diagnosis present

## 2021-06-19 DIAGNOSIS — R1084 Generalized abdominal pain: Secondary | ICD-10-CM

## 2021-06-19 DIAGNOSIS — Z9049 Acquired absence of other specified parts of digestive tract: Secondary | ICD-10-CM

## 2021-06-19 DIAGNOSIS — F17210 Nicotine dependence, cigarettes, uncomplicated: Secondary | ICD-10-CM | POA: Diagnosis present

## 2021-06-19 DIAGNOSIS — T8132XA Disruption of internal operation (surgical) wound, not elsewhere classified, initial encounter: Secondary | ICD-10-CM | POA: Diagnosis present

## 2021-06-19 DIAGNOSIS — Z8616 Personal history of COVID-19: Secondary | ICD-10-CM

## 2021-06-19 DIAGNOSIS — R0902 Hypoxemia: Secondary | ICD-10-CM | POA: Diagnosis not present

## 2021-06-19 DIAGNOSIS — Z91041 Radiographic dye allergy status: Secondary | ICD-10-CM

## 2021-06-19 DIAGNOSIS — Y832 Surgical operation with anastomosis, bypass or graft as the cause of abnormal reaction of the patient, or of later complication, without mention of misadventure at the time of the procedure: Secondary | ICD-10-CM | POA: Diagnosis present

## 2021-06-19 DIAGNOSIS — Z933 Colostomy status: Secondary | ICD-10-CM

## 2021-06-19 DIAGNOSIS — S36498A Other injury of other part of small intestine, initial encounter: Secondary | ICD-10-CM | POA: Diagnosis present

## 2021-06-19 DIAGNOSIS — G4733 Obstructive sleep apnea (adult) (pediatric): Secondary | ICD-10-CM | POA: Diagnosis present

## 2021-06-19 DIAGNOSIS — K660 Peritoneal adhesions (postprocedural) (postinfection): Secondary | ICD-10-CM | POA: Diagnosis present

## 2021-06-19 DIAGNOSIS — T81321A Disruption or dehiscence of closure of internal operation (surgical) wound of abdominal wall muscle or fascia, initial encounter: Secondary | ICD-10-CM | POA: Diagnosis present

## 2021-06-19 DIAGNOSIS — T8131XA Disruption of external operation (surgical) wound, not elsewhere classified, initial encounter: Secondary | ICD-10-CM | POA: Diagnosis present

## 2021-06-19 DIAGNOSIS — Z882 Allergy status to sulfonamides status: Secondary | ICD-10-CM

## 2021-06-19 DIAGNOSIS — Z7952 Long term (current) use of systemic steroids: Secondary | ICD-10-CM

## 2021-06-19 DIAGNOSIS — B961 Klebsiella pneumoniae [K. pneumoniae] as the cause of diseases classified elsewhere: Secondary | ICD-10-CM | POA: Diagnosis present

## 2021-06-19 DIAGNOSIS — M329 Systemic lupus erythematosus, unspecified: Secondary | ICD-10-CM | POA: Diagnosis present

## 2021-06-19 DIAGNOSIS — Z96651 Presence of right artificial knee joint: Secondary | ICD-10-CM | POA: Diagnosis present

## 2021-06-19 DIAGNOSIS — Z888 Allergy status to other drugs, medicaments and biological substances status: Secondary | ICD-10-CM

## 2021-06-19 HISTORY — PX: LAPAROTOMY: SHX154

## 2021-06-19 LAB — CBC WITH DIFFERENTIAL/PLATELET
Abs Immature Granulocytes: 0.08 10*3/uL — ABNORMAL HIGH (ref 0.00–0.07)
Basophils Absolute: 0 10*3/uL (ref 0.0–0.1)
Basophils Relative: 0 %
Eosinophils Absolute: 0 10*3/uL (ref 0.0–0.5)
Eosinophils Relative: 0 %
HCT: 38.1 % (ref 36.0–46.0)
Hemoglobin: 11.6 g/dL — ABNORMAL LOW (ref 12.0–15.0)
Immature Granulocytes: 1 %
Lymphocytes Relative: 6 %
Lymphs Abs: 1 10*3/uL (ref 0.7–4.0)
MCH: 27.2 pg (ref 26.0–34.0)
MCHC: 30.4 g/dL (ref 30.0–36.0)
MCV: 89.4 fL (ref 80.0–100.0)
Monocytes Absolute: 0.9 10*3/uL (ref 0.1–1.0)
Monocytes Relative: 5 %
Neutro Abs: 15.7 10*3/uL — ABNORMAL HIGH (ref 1.7–7.7)
Neutrophils Relative %: 88 %
Platelets: 505 10*3/uL — ABNORMAL HIGH (ref 150–400)
RBC: 4.26 MIL/uL (ref 3.87–5.11)
RDW: 17 % — ABNORMAL HIGH (ref 11.5–15.5)
WBC: 17.7 10*3/uL — ABNORMAL HIGH (ref 4.0–10.5)
nRBC: 0 % (ref 0.0–0.2)

## 2021-06-19 LAB — COMPREHENSIVE METABOLIC PANEL
ALT: 11 U/L (ref 0–44)
AST: 24 U/L (ref 15–41)
Albumin: 2.9 g/dL — ABNORMAL LOW (ref 3.5–5.0)
Alkaline Phosphatase: 71 U/L (ref 38–126)
Anion gap: 8 (ref 5–15)
BUN: 12 mg/dL (ref 6–20)
CO2: 32 mmol/L (ref 22–32)
Calcium: 8.4 mg/dL — ABNORMAL LOW (ref 8.9–10.3)
Chloride: 96 mmol/L — ABNORMAL LOW (ref 98–111)
Creatinine, Ser: 0.76 mg/dL (ref 0.44–1.00)
GFR, Estimated: >60 mL/min (ref >60)
Glucose, Bld: 129 mg/dL — ABNORMAL HIGH (ref 70–99)
Potassium: 4.9 mmol/L (ref 3.5–5.1)
Sodium: 136 mmol/L (ref 135–145)
Total Bilirubin: 0.7 mg/dL (ref 0.3–1.2)
Total Protein: 6.6 g/dL (ref 6.5–8.1)

## 2021-06-19 LAB — BRAIN NATRIURETIC PEPTIDE: B Natriuretic Peptide: 87 pg/mL (ref 0.0–100.0)

## 2021-06-19 LAB — RESP PANEL BY RT-PCR (FLU A&B, COVID) ARPGX2
Influenza A by PCR: NEGATIVE
Influenza B by PCR: NEGATIVE
SARS Coronavirus 2 by RT PCR: NEGATIVE

## 2021-06-19 LAB — LIPASE, BLOOD: Lipase: 23 U/L (ref 11–51)

## 2021-06-19 IMAGING — DX DG CHEST 1V PORT
1 series · 1 of 1 positions shown · non-contrast
Comparison: Chest radiograph [DATE].

CLINICAL DATA: Abdominal pain.  Hypoxia.

EXAM:
PORTABLE CHEST 1 VIEW

[chest ap]
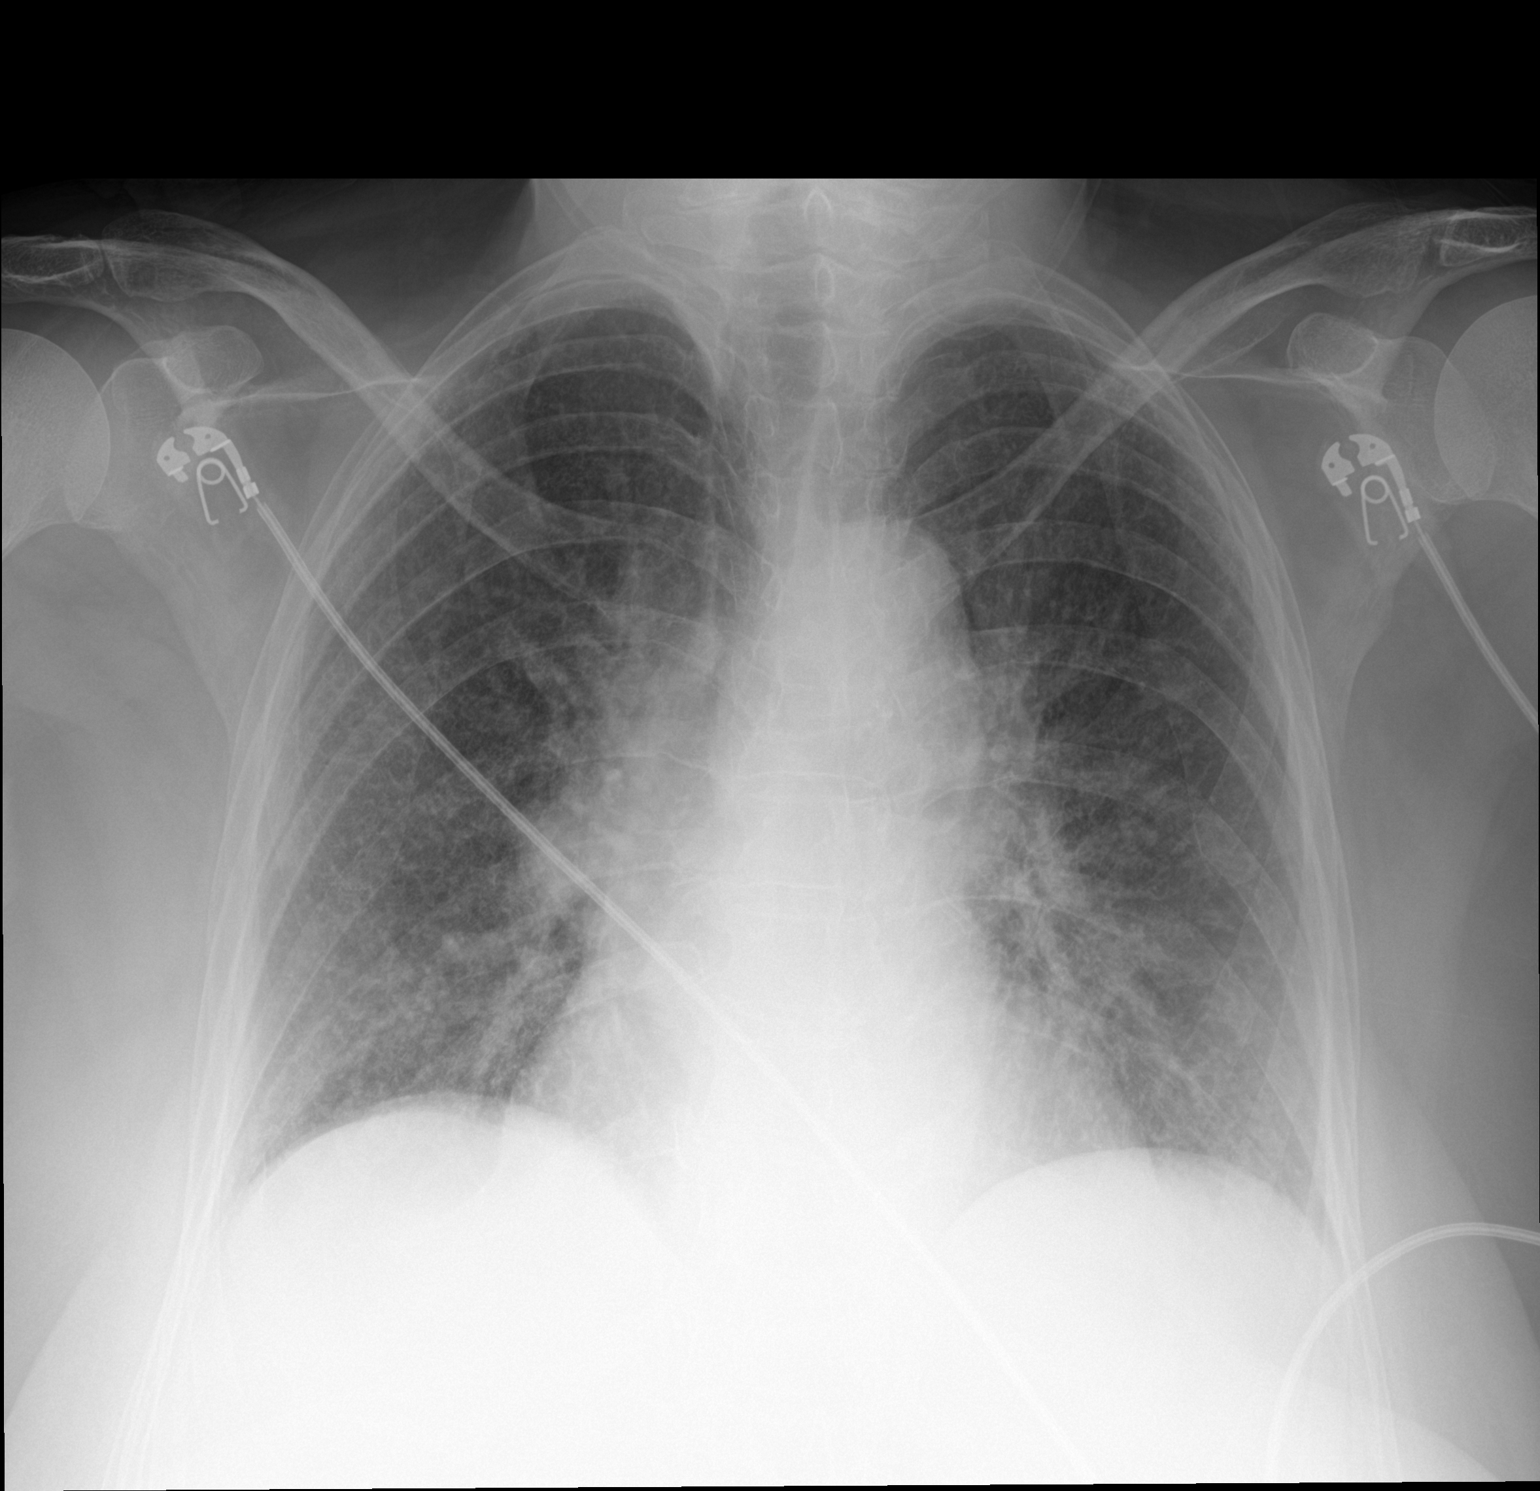

[1 of 1 positions shown; findings below may reference images not displayed]

FINDINGS: Stable cardiac and mediastinal contours. Interval increase in
perihilar interstitial opacities. No large area pulmonary
consolidation. No pleural effusion or pneumothorax.
IMPRESSION: Findings suggestive of mild interstitial edema.

## 2021-06-19 SURGERY — LAPAROTOMY, EXPLORATORY
Anesthesia: General | Site: Abdomen

## 2021-06-19 MED ORDER — SUCCINYLCHOLINE CHLORIDE 200 MG/10ML IV SOSY
PREFILLED_SYRINGE | INTRAVENOUS | Status: AC
  Filled 2021-06-19: qty 10

## 2021-06-19 MED ORDER — EPHEDRINE 5 MG/ML INJ
INTRAVENOUS | Status: AC
  Filled 2021-06-19: qty 5

## 2021-06-19 MED ORDER — KETOROLAC TROMETHAMINE 30 MG/ML IJ SOLN
30.0000 mg | Freq: Four times a day (QID) | INTRAMUSCULAR | Status: DC | PRN
  Administered 2021-06-20 – 2021-06-21 (×3): 30 mg via INTRAVENOUS
  Filled 2021-06-19 (×3): qty 1

## 2021-06-19 MED ORDER — PROPOFOL 10 MG/ML IV BOLUS
INTRAVENOUS | Status: DC | PRN
  Administered 2021-06-19: 80 mg via INTRAVENOUS

## 2021-06-19 MED ORDER — 0.9 % SODIUM CHLORIDE (POUR BTL) OPTIME
TOPICAL | Status: DC | PRN
  Administered 2021-06-19: 2000 mL

## 2021-06-19 MED ORDER — ACETAMINOPHEN 10 MG/ML IV SOLN
1000.0000 mg | Freq: Once | INTRAVENOUS | Status: DC | PRN
  Administered 2021-06-19: 1000 mg via INTRAVENOUS

## 2021-06-19 MED ORDER — LACTATED RINGERS IV SOLN: INTRAVENOUS | Status: DC | PRN

## 2021-06-19 MED ORDER — MIDAZOLAM HCL 2 MG/2ML IJ SOLN
INTRAMUSCULAR | Status: AC
  Filled 2021-06-19: qty 2

## 2021-06-19 MED ORDER — SUGAMMADEX SODIUM 200 MG/2ML IV SOLN
INTRAVENOUS | Status: DC | PRN
  Administered 2021-06-19: 200 mg via INTRAVENOUS

## 2021-06-19 MED ORDER — DEXAMETHASONE SODIUM PHOSPHATE 10 MG/ML IJ SOLN
INTRAMUSCULAR | Status: AC
  Filled 2021-06-19: qty 1

## 2021-06-19 MED ORDER — ROCURONIUM BROMIDE 10 MG/ML (PF) SYRINGE
PREFILLED_SYRINGE | INTRAVENOUS | Status: AC
  Filled 2021-06-19: qty 10

## 2021-06-19 MED ORDER — FENTANYL CITRATE PF 50 MCG/ML IJ SOSY
25.0000 ug | PREFILLED_SYRINGE | INTRAMUSCULAR | Status: DC | PRN
  Administered 2021-06-19: 50 ug via INTRAVENOUS
  Administered 2021-06-19: 25 ug via INTRAVENOUS

## 2021-06-19 MED ORDER — METOPROLOL TARTRATE 5 MG/5ML IV SOLN
5.0000 mg | Freq: Four times a day (QID) | INTRAVENOUS | Status: DC | PRN
Start: 2021-06-19 — End: 2021-06-21

## 2021-06-19 MED ORDER — ONDANSETRON 4 MG PO TBDP: 4.0000 mg | ORAL_TABLET | Freq: Four times a day (QID) | ORAL | Status: DC | PRN

## 2021-06-19 MED ORDER — DEXAMETHASONE SODIUM PHOSPHATE 4 MG/ML IJ SOLN
INTRAMUSCULAR | Status: DC | PRN
  Administered 2021-06-19: 5 mg via INTRAVENOUS

## 2021-06-19 MED ORDER — ONDANSETRON HCL 4 MG/2ML IJ SOLN
INTRAMUSCULAR | Status: AC
  Filled 2021-06-19: qty 2

## 2021-06-19 MED ORDER — DIPHENHYDRAMINE HCL 50 MG/ML IJ SOLN: 25.0000 mg | Freq: Four times a day (QID) | INTRAMUSCULAR | Status: DC | PRN

## 2021-06-19 MED ORDER — DIPHENHYDRAMINE HCL 25 MG PO CAPS: 25.0000 mg | ORAL_CAPSULE | Freq: Four times a day (QID) | ORAL | Status: DC | PRN

## 2021-06-19 MED ORDER — SUCCINYLCHOLINE CHLORIDE 200 MG/10ML IV SOSY
PREFILLED_SYRINGE | INTRAVENOUS | Status: DC | PRN
Start: 2021-06-19 — End: 2021-06-19
  Administered 2021-06-19: 100 mg via INTRAVENOUS

## 2021-06-19 MED ORDER — PIPERACILLIN-TAZOBACTAM 3.375 G IVPB 30 MIN
3.3750 g | Freq: Once | INTRAVENOUS | Status: AC
  Administered 2021-06-19: 3.375 g via INTRAVENOUS
  Filled 2021-06-19: qty 50

## 2021-06-19 MED ORDER — FENTANYL CITRATE PF 50 MCG/ML IJ SOSY
PREFILLED_SYRINGE | INTRAMUSCULAR | Status: AC
  Filled 2021-06-19: qty 3

## 2021-06-19 MED ORDER — ALBUTEROL SULFATE (2.5 MG/3ML) 0.083% IN NEBU: 3.0000 mL | INHALATION_SOLUTION | Freq: Four times a day (QID) | RESPIRATORY_TRACT | Status: DC | PRN

## 2021-06-19 MED ORDER — AMISULPRIDE (ANTIEMETIC) 5 MG/2ML IV SOLN: 10.0000 mg | Freq: Once | INTRAVENOUS | Status: DC | PRN

## 2021-06-19 MED ORDER — PHENYLEPHRINE 40 MCG/ML (10ML) SYRINGE FOR IV PUSH (FOR BLOOD PRESSURE SUPPORT)
PREFILLED_SYRINGE | INTRAVENOUS | Status: DC | PRN
  Administered 2021-06-19: 200 ug via INTRAVENOUS
  Administered 2021-06-19: 120 ug via INTRAVENOUS
  Administered 2021-06-19: 80 ug via INTRAVENOUS
  Administered 2021-06-19: 120 ug via INTRAVENOUS

## 2021-06-19 MED ORDER — HYDROCODONE-ACETAMINOPHEN 5-325 MG PO TABS
1.0000 | ORAL_TABLET | Freq: Four times a day (QID) | ORAL | Status: DC | PRN
  Administered 2021-06-20 – 2021-06-21 (×3): 2 via ORAL
  Filled 2021-06-19 (×3): qty 2

## 2021-06-19 MED ORDER — LORAZEPAM 0.5 MG PO TABS
0.5000 mg | ORAL_TABLET | Freq: Every day | ORAL | Status: DC
  Administered 2021-06-19 – 2021-06-20 (×2): 0.5 mg via ORAL
  Filled 2021-06-19 (×2): qty 1

## 2021-06-19 MED ORDER — PHENYLEPHRINE 40 MCG/ML (10ML) SYRINGE FOR IV PUSH (FOR BLOOD PRESSURE SUPPORT)
PREFILLED_SYRINGE | INTRAVENOUS | Status: AC
  Filled 2021-06-19: qty 10

## 2021-06-19 MED ORDER — POLYVINYL ALCOHOL 1.4 % OP SOLN
1.0000 [drp] | Freq: Three times a day (TID) | OPHTHALMIC | Status: DC | PRN
  Filled 2021-06-19: qty 15

## 2021-06-19 MED ORDER — ESCITALOPRAM OXALATE 20 MG PO TABS
20.0000 mg | ORAL_TABLET | Freq: Every day | ORAL | Status: DC
  Administered 2021-06-19 – 2021-06-20 (×2): 20 mg via ORAL
  Filled 2021-06-19 (×2): qty 1

## 2021-06-19 MED ORDER — LORAZEPAM 1 MG PO TABS
1.0000 mg | ORAL_TABLET | Freq: Every day | ORAL | Status: DC
  Administered 2021-06-20: 1 mg via ORAL
  Filled 2021-06-19: qty 1

## 2021-06-19 MED ORDER — LIDOCAINE 2% (20 MG/ML) 5 ML SYRINGE
INTRAMUSCULAR | Status: DC | PRN
  Administered 2021-06-19: 60 mg via INTRAVENOUS

## 2021-06-19 MED ORDER — KETOROLAC TROMETHAMINE 30 MG/ML IJ SOLN: 30.0000 mg | Freq: Four times a day (QID) | INTRAMUSCULAR | Status: AC

## 2021-06-19 MED ORDER — MIDAZOLAM HCL 5 MG/5ML IJ SOLN
INTRAMUSCULAR | Status: DC | PRN
  Administered 2021-06-19: 2 mg via INTRAVENOUS

## 2021-06-19 MED ORDER — CHLORHEXIDINE GLUCONATE CLOTH 2 % EX PADS: 6.0000 | MEDICATED_PAD | Freq: Once | CUTANEOUS | Status: DC

## 2021-06-19 MED ORDER — ENOXAPARIN SODIUM 40 MG/0.4ML IJ SOSY
40.0000 mg | PREFILLED_SYRINGE | INTRAMUSCULAR | Status: DC
  Administered 2021-06-20 – 2021-06-21 (×2): 40 mg via SUBCUTANEOUS
  Filled 2021-06-19 (×2): qty 0.4

## 2021-06-19 MED ORDER — KETOROLAC TROMETHAMINE 15 MG/ML IJ SOLN
15.0000 mg | Freq: Once | INTRAMUSCULAR | Status: AC
  Administered 2021-06-19: 15 mg via INTRAVENOUS

## 2021-06-19 MED ORDER — PHENYLEPHRINE HCL (PRESSORS) 10 MG/ML IV SOLN
INTRAVENOUS | Status: AC
  Filled 2021-06-19: qty 2

## 2021-06-19 MED ORDER — METOPROLOL TARTRATE 12.5 MG HALF TABLET
12.5000 mg | ORAL_TABLET | Freq: Every day | ORAL | Status: DC
  Administered 2021-06-19 – 2021-06-20 (×2): 12.5 mg via ORAL
  Filled 2021-06-19 (×2): qty 1

## 2021-06-19 MED ORDER — ONDANSETRON HCL 4 MG/2ML IJ SOLN
INTRAMUSCULAR | Status: DC | PRN
  Administered 2021-06-19: 4 mg via INTRAVENOUS

## 2021-06-19 MED ORDER — KETOROLAC TROMETHAMINE 15 MG/ML IJ SOLN
INTRAMUSCULAR | Status: AC
  Filled 2021-06-19: qty 1

## 2021-06-19 MED ORDER — PHENYLEPHRINE HCL (PRESSORS) 10 MG/ML IV SOLN
INTRAVENOUS | Status: DC | PRN
  Administered 2021-06-19: 120 ug via INTRAVENOUS

## 2021-06-19 MED ORDER — FENTANYL CITRATE (PF) 100 MCG/2ML IJ SOLN
INTRAMUSCULAR | Status: DC | PRN
  Administered 2021-06-19 (×2): 50 ug via INTRAVENOUS

## 2021-06-19 MED ORDER — TRAMADOL HCL 50 MG PO TABS
50.0000 mg | ORAL_TABLET | Freq: Four times a day (QID) | ORAL | Status: DC | PRN
  Administered 2021-06-19: 50 mg via ORAL
  Filled 2021-06-19: qty 1

## 2021-06-19 MED ORDER — HYDROMORPHONE HCL 1 MG/ML IJ SOLN
1.0000 mg | INTRAMUSCULAR | Status: DC | PRN
  Administered 2021-06-20 (×3): 1 mg via INTRAVENOUS
  Filled 2021-06-19 (×3): qty 1

## 2021-06-19 MED ORDER — EPHEDRINE SULFATE-NACL 50-0.9 MG/10ML-% IV SOSY
PREFILLED_SYRINGE | INTRAVENOUS | Status: DC | PRN
  Administered 2021-06-19: 5 mg via INTRAVENOUS

## 2021-06-19 MED ORDER — FENTANYL CITRATE (PF) 100 MCG/2ML IJ SOLN
INTRAMUSCULAR | Status: AC
  Filled 2021-06-19: qty 2

## 2021-06-19 MED ORDER — ROCURONIUM BROMIDE 10 MG/ML (PF) SYRINGE
PREFILLED_SYRINGE | INTRAVENOUS | Status: DC | PRN
  Administered 2021-06-19: 40 mg via INTRAVENOUS

## 2021-06-19 MED ORDER — LORAZEPAM 0.5 MG PO TABS: 0.5000 mg | ORAL_TABLET | ORAL | Status: DC

## 2021-06-19 MED ORDER — ACETAMINOPHEN 10 MG/ML IV SOLN
INTRAVENOUS | Status: AC
  Filled 2021-06-19: qty 100

## 2021-06-19 MED ORDER — CEFAZOLIN SODIUM-DEXTROSE 2-4 GM/100ML-% IV SOLN: 2.0000 g | INTRAVENOUS | Status: DC

## 2021-06-19 MED ORDER — PHENYLEPHRINE HCL-NACL 20-0.9 MG/250ML-% IV SOLN
INTRAVENOUS | Status: DC | PRN
  Administered 2021-06-19: 40 ug/min via INTRAVENOUS

## 2021-06-19 MED ORDER — ONDANSETRON HCL 4 MG/2ML IJ SOLN: 4.0000 mg | Freq: Four times a day (QID) | INTRAMUSCULAR | Status: DC | PRN

## 2021-06-19 MED ORDER — CHLORHEXIDINE GLUCONATE CLOTH 2 % EX PADS
6.0000 | MEDICATED_PAD | Freq: Once | CUTANEOUS | Status: AC
  Administered 2021-06-19: 6 via TOPICAL

## 2021-06-19 MED ORDER — DEXTROSE-NACL 5-0.9 % IV SOLN: INTRAVENOUS | Status: DC

## 2021-06-19 MED ORDER — PROPOFOL 10 MG/ML IV BOLUS
INTRAVENOUS | Status: AC
  Filled 2021-06-19: qty 20

## 2021-06-19 SURGICAL SUPPLY — 42 items
APL SWBSTK 6 STRL LF DISP (MISCELLANEOUS) ×1
APPLICATOR COTTON TIP 6 STRL (MISCELLANEOUS) ×1 IMPLANT
APPLICATOR COTTON TIP 6IN STRL (MISCELLANEOUS) ×2 IMPLANT
BAG COUNTER SPONGE SURGICOUNT (BAG) IMPLANT
BAG SPNG CNTER NS LX DISP (BAG)
BLADE EXTENDED COATED 6.5IN (ELECTRODE) IMPLANT
BLADE HEX COATED 2.75 (ELECTRODE) ×2 IMPLANT
BNDG GAUZE ELAST 4 BULKY (GAUZE/BANDAGES/DRESSINGS) ×1 IMPLANT
COVER MAYO STAND STRL (DRAPES) ×1 IMPLANT
DRAPE LAPAROSCOPIC ABDOMINAL (DRAPES) ×2 IMPLANT
DRAPE WARM FLUID 44X44 (DRAPES) ×1 IMPLANT
DRSG PAD ABDOMINAL 8X10 ST (GAUZE/BANDAGES/DRESSINGS) ×1 IMPLANT
ELECT REM PT RETURN 15FT ADLT (MISCELLANEOUS) ×2 IMPLANT
GAUZE SPONGE 4X4 12PLY STRL (GAUZE/BANDAGES/DRESSINGS) ×2 IMPLANT
GLOVE SURG GAMMEX PI TX LF 6.5 (GLOVE) ×1 IMPLANT
GLOVE SURG NEOPR MICRO LF SZ8 (GLOVE) ×2 IMPLANT
GLOVE SURG UNDER LTX SZ8 (GLOVE) ×4 IMPLANT
GLOVE SURG UNDER POLY LF SZ7 (GLOVE) ×2 IMPLANT
GOWN STRL REUS W/TWL LRG LVL3 (GOWN DISPOSABLE) ×2 IMPLANT
GOWN STRL REUS W/TWL XL LVL3 (GOWN DISPOSABLE) ×4 IMPLANT
HANDLE SUCTION POOLE (INSTRUMENTS) IMPLANT
KIT BASIN OR (CUSTOM PROCEDURE TRAY) ×2 IMPLANT
KIT TURNOVER KIT A (KITS) ×2 IMPLANT
NS IRRIG 1000ML POUR BTL (IV SOLUTION) ×2 IMPLANT
PACK GENERAL/GYN (CUSTOM PROCEDURE TRAY) ×2 IMPLANT
RELOAD STAPLE 45 3.5 BLU ETS (ENDOMECHANICALS) IMPLANT
RELOAD STAPLE TA45 3.5 REG BLU (ENDOMECHANICALS) ×2 IMPLANT
SPONGE T-LAP 18X18 ~~LOC~~+RFID (SPONGE) ×2 IMPLANT
STAPLER VISISTAT 35W (STAPLE) ×2 IMPLANT
SUCTION POOLE HANDLE (INSTRUMENTS)
SUT ETHIBOND 2 (SUTURE) ×8 IMPLANT
SUT PDS AB 1 CTX 36 (SUTURE) ×2 IMPLANT
SUT SILK 2 0 (SUTURE)
SUT SILK 2-0 18XBRD TIE 12 (SUTURE) IMPLANT
SUT SILK 3 0 (SUTURE)
SUT SILK 3 0 SH CR/8 (SUTURE) IMPLANT
SUT SILK 3-0 18XBRD TIE 12 (SUTURE) IMPLANT
SUT VIC AB 2-0 SH 18 (SUTURE) ×1 IMPLANT
SUT VIC AB 3-0 SH 18 (SUTURE) IMPLANT
TOWEL OR 17X26 10 PK STRL BLUE (TOWEL DISPOSABLE) ×4 IMPLANT
TRAY FOLEY MTR SLVR 16FR STAT (SET/KITS/TRAYS/PACK) IMPLANT
YANKAUER SUCT BULB TIP NO VENT (SUCTIONS) IMPLANT

## 2021-06-19 NOTE — Anesthesia Preprocedure Evaluation (Addendum)
Anesthesia Evaluation  Patient identified by MRN, date of birth, ID band Patient awake    Reviewed: Allergy & Precautions, NPO status , Patient's Chart, lab work & pertinent test results  History of Anesthesia Complications (+) PONV  Airway Mallampati: I  TM Distance: >3 FB Neck ROM: Full    Dental  (+) Upper Dentures   Pulmonary asthma ,  COPD inhaler, Current Smoker,    Pulmonary exam normal        Cardiovascular hypertension, Pt. on medications and Pt. on home beta blockers  Rhythm:Regular Rate:Normal     Neuro/Psych Anxiety negative neurological ROS     GI/Hepatic Neg liver ROS, Fascial dehiscence s/p ex lap 2/2 diverticulitis 9/23   Endo/Other  negative endocrine ROS  Renal/GU negative Renal ROS  negative genitourinary   Musculoskeletal  (+) Arthritis , Osteoarthritis,    Abdominal (+)  Abdomen: soft.    Peds  Hematology negative hematology ROS (+)   Anesthesia Other Findings   Reproductive/Obstetrics                            Anesthesia Physical Anesthesia Plan  ASA: 2 and emergent  Anesthesia Plan: General   Post-op Pain Management:    Induction: Intravenous and Rapid sequence  PONV Risk Score and Plan: 3  Airway Management Planned: Mask and Oral ETT  Additional Equipment: None  Intra-op Plan:   Post-operative Plan: Extubation in OR  Informed Consent: I have reviewed the patients History and Physical, chart, labs and discussed the procedure including the risks, benefits and alternatives for the proposed anesthesia with the patient or authorized representative who has indicated his/her understanding and acceptance.     Dental advisory given  Plan Discussed with: CRNA  Anesthesia Plan Comments: (Lab Results      Component                Value               Date                      WBC                      10.4                06/17/2021                HGB                       10.2 (L)            06/17/2021                HCT                      31.5 (L)            06/17/2021                MCV                      86.1                06/17/2021                PLT  476 (H)             06/17/2021           Lab Results      Component                Value               Date                      NA                       138                 06/17/2021                K                        3.4 (L)             06/17/2021                CO2                      28                  06/17/2021                GLUCOSE                  104 (H)             06/17/2021                BUN                      7                   06/17/2021                CREATININE               0.53                06/17/2021                CALCIUM                  8.5 (L)             06/17/2021                GFRNONAA                 >60                 06/17/2021          )       Anesthesia Quick Evaluation

## 2021-06-19 NOTE — Anesthesia Postprocedure Evaluation (Signed)
Anesthesia Post Note  Patient: Brittany Hobbs  Procedure(s) Performed: EXPLORATORY LAPAROTOMY (Abdomen)     Patient location during evaluation: PACU Anesthesia Type: General Level of consciousness: awake and alert Pain management: pain level controlled Vital Signs Assessment: post-procedure vital signs reviewed and stable Respiratory status: spontaneous breathing, nonlabored ventilation, respiratory function stable and patient connected to nasal cannula oxygen Cardiovascular status: blood pressure returned to baseline and stable Postop Assessment: no apparent nausea or vomiting Anesthetic complications: no   No notable events documented.  Last Vitals:  Vitals:   06/19/21 1520 06/19/21 1738  BP: (!) 157/77 134/69  Pulse: 67 60  Resp: 12 13  Temp: 37.1 C 36.7 C  SpO2: 97% 97%    Last Pain:  Vitals:   06/19/21 1738  TempSrc: Oral  PainSc:                  March Rummage Deni Berti

## 2021-06-19 NOTE — Op Note (Signed)
Preoperative diagnosis: Dehiscence of midline abdominal wound status post sigmoid colectomy colostomy 9 days ago  Postoperative diagnosis: Same  Procedure: Exploratory laparotomy with closure of fascia using retention sutures Surgeon: Erroll Luna, MD  Anesthesia: General  EBL: Minimal  Specimen: None  Findings: Complete dehiscence of fascia.  Extremely poor fascial tissue.  Area of serosal injury to small bowel secondary to previous suture causing erosion through serosa without full-thickness injury.  IV fluids: Per anesthesia record  Indications for procedure: The patient is a 60 year old female 9 days status post sigmoid colectomy and colostomy by Dr. Michaelle Birks for sigmoid diverticulitis.  She is on chronic steroids due to lupus.  She was doing well was discharged on yesterday.  She did some light vacuuming.  No lifting.  Today she woke up and she had complete dehisced and eviscerated.  She came emergency room and seen immediately.  She was brought emergently back to the operating room for closure.  Risks and benefits of surgery discussed.  Risk of bleeding, infection, bowel injury, bladder injury, recurrence, hernia, cardiovascular issues, pulmonary issues, and the need further treatments and/or procedures.  Discussed higher rate of enterocutaneous fistula, chronic injury, and chronic wound.  She is on steroids and wound healing will definitely be implicated she smokes.  These were explained to be risk factors for postop complication as well.  She understood the above and agreed to proceed.  Description of procedure: The patient was brought to the holding area.  She was seen emergently taken back to operative room.  She is placed upon upon the OR table.  After induction general anesthesia, she was prepped and draped in sterile fashion.  Timeout performed.  She already eviscerated.  Her ostomy appliance was removed well.  Examination revealed complete disruption of the suture line holding  the fascia in place.  There is 1 suture that was eroding into a loop of small bowel.  This was cut and the bowel was released.  There is 1/2 inch serosal tear.  I repaired this with interrupted 2-0 Vicryl.  No other bowel injury was noted.  There is significant matting of the small bowel together but due to her steroids the adhesions to her anterior wall or minimal.  The fascial quality was extremely poor.  Was quite fibrous and weak.  I felt retention sutures would be helpful to close her.  Sutures were placed in the fascia and held.  I ran the fascia closed after inspecting the internal viscera and seeing no other evidence of injury and after irrigating with #1 single-stranded PDS.  2-0 Ethibond suture was used for fascial retention sutures.  This was placed as I closed the fascia with a running PDS suture.  A total of 6 and these were placed.  Red rubber catheter was used as bumpers.  I ran to the #1 Novafil's to close the wound.  These retention sutures were tied down as I went and I used my finger to sweep under the fascia to prevent any obvious entrapment of the small bowel or injury to the bowel.  The wound closed without any undue tension.  Packed with saline soaked Kerlix.  Ostomy appliance placed.  Dry dressings placed.  All counts were found to be correct.  The patient was awoke extubated taken to recovery in satisfactory condition.   CASE DATA:  Type of patient?: LDOW CASE (Surgical Hospitalist [facility])  Status of Case? EMERGENT Add On  Infection Present At Time Of Surgery (PATOS)?  PHLEGMON involving the  abdominal wall

## 2021-06-19 NOTE — ED Triage Notes (Signed)
Pt BIB [facility] from home for abdominal pain. Recently had ostomy bag placed, d/c yesterday. Pt states she was vacuuming yesterday when she felt like something tore and pain started.  BP 140/72 HR90 RR 20 SpO2 92% RA, 99% 3L CBG 222

## 2021-06-19 NOTE — Interval H&P Note (Signed)
History and Physical Interval Note:  06/19/2021 11:27 AM  Zannie Cove  has presented today for surgery, with the diagnosis of FASCIAL DEHISCENCE.  The various methods of treatment have been discussed with the patient and family. After consideration of risks, benefits and other options for treatment, the patient has consented to  Procedure(s): EXPLORATORY LAPAROTOMY (N/A) as a surgical intervention.  The patient's history has been reviewed, patient examined, no change in status, stable for surgery.  I have reviewed the patient's chart and labs.  Questions were answered to the patient's satisfaction.     [place]

## 2021-06-19 NOTE — H&P (Signed)
Brittany Hobbs is an 60 y.o. female.   Chief Complaint: Open abdominal wound HPI: Patient is a 60 year old female discharged yesterday 9 days status post sigmoid colectomy and colostomy by Dr. Wilburn Cornelia for diverticulitis and abscess.  She had an open wound that was managed with wound care.  She was seen yesterday and the wound looked closed with healing nicely.  She went home.  She had a vacuum some kitty litter that was spilled but this did not require an excessive amount of lifting she states.  She she went to bed and woke up with her "insides on her outside".  Denies any history of a popping sensation or pain.  She does have lupus and is on chronic steroids.  She came to the ED was found to have complete dehiscence with complete evisceration of her internal viscera.  She has no hemodynamic instability.  She did have some shortness of breath was being evaluated by the EDP.  She is in no obvious distress or pain.  Past Medical History:  Diagnosis Date   Arthritis    Asthma    Diverticulitis    Hyperlipidemia    Lupus ([place])    Panic attacks    Pneumonia 09/2020   with covid   PONV (postoperative nausea and vomiting)    Seasonal allergies     Past Surgical History:  Procedure Laterality Date   COLON RESECTION SIGMOID N/A 06/10/2021   Procedure: OPEN SIGMOID COLON RESECTION WITH END COLOSTOMY AND ABDOMINAL WASHOUT;  Surgeon: Dwan Bolt, MD;  Location: [facility];  Service: General;  Laterality: N/A;   CYSTOSCOPY WITH STENT PLACEMENT  06/10/2021   Procedure: CYSTOSCOPY WITH LEFT STENT PLACEMENT;  Surgeon: Dwan Bolt, MD;  Location: [facility];  Service: General;;   PATELLA FRACTURE SURGERY Left    scar tissue eye  1983   right   TOTAL KNEE ARTHROPLASTY Right 05/20/2021   Procedure: RIGHT TOTAL KNEE ARTHROPLASTY;  Surgeon: Mcarthur Rossetti, MD;  Location: [facility];  Service: Orthopedics;  Laterality: Right;  Needs RNFA   TUBAL LIGATION  1985   WRIST FRACTURE SURGERY Right      Family History  Problem Relation Age of Onset   Colon cancer Neg Hx    Stomach cancer Neg Hx    Esophageal cancer Neg Hx    Rectal cancer Neg Hx    Social History:  reports that she has been smoking cigarettes. She has a 9.00 pack-year smoking history. She has never used smokeless tobacco. She reports current drug use. Frequency: 7.00 times per week. Drug: Marijuana. She reports that she does not drink alcohol.  Allergies:  Allergies  Allergen Reactions   Contrast Media [Iodinated Diagnostic Agents] Anaphylaxis   Iodine Anaphylaxis    Pt states she is allergic to INTERNAL IODINE   Betadine [Povidone Iodine] Hives   Methocarbamol Nausea Only    Felt nauseated and sick   Sulfa Antibiotics     N/V    (Not in a hospital admission)   Results for orders placed or performed during the hospital encounter of 06/19/21 (from the past 48 hour(s))  CBC with Differential     Status: Abnormal   Collection Time: 06/19/21 10:44 AM  Result Value Ref Range   WBC 17.7 (H) 4.0 - 10.5 K/uL   RBC 4.26 3.87 - 5.11 MIL/uL   Hemoglobin 11.6 (L) 12.0 - 15.0 g/dL   HCT 38.1 36.0 - 46.0 %   MCV 89.4 80.0 - 100.0 fL  MCH 27.2 26.0 - 34.0 pg   MCHC 30.4 30.0 - 36.0 g/dL   RDW 17.0 (H) 11.5 - 15.5 %   Platelets 505 (H) 150 - 400 K/uL   nRBC 0.0 0.0 - 0.2 %   Neutrophils Relative % 88 %   Neutro Abs 15.7 (H) 1.7 - 7.7 K/uL   Lymphocytes Relative 6 %   Lymphs Abs 1.0 0.7 - 4.0 K/uL   Monocytes Relative 5 %   Monocytes Absolute 0.9 0.1 - 1.0 K/uL   Eosinophils Relative 0 %   Eosinophils Absolute 0.0 0.0 - 0.5 K/uL   Basophils Relative 0 %   Basophils Absolute 0.0 0.0 - 0.1 K/uL   Immature Granulocytes 1 %   Abs Immature Granulocytes 0.08 (H) 0.00 - 0.07 K/uL    Comment: Performed at [facility], [place] [address] [place], [place] [address]   CT ABDOMEN PELVIS WO CONTRAST  Result Date: 06/17/2021 CLINICAL DATA:  Evaluate for intra-abdominal abscess. Status post  diverticulitis with perforation. Status post exploratory laparotomy with washout in sigmoid colectomy with end colostomy. Stent placed 06/10/2021. EXAM: CT ABDOMEN AND PELVIS WITHOUT CONTRAST TECHNIQUE: Multidetector CT imaging of the abdomen and pelvis was performed following the standard protocol without IV contrast. COMPARISON:  CT abdomen and pelvis 06/08/2021. FINDINGS: Lower chest: There is minimal atelectasis or scarring in the lung bases. Hepatobiliary: Multiple gallstones are again seen. There is no biliary ductal dilatation. Bile ducts are within normal limits. Pancreas: Unremarkable. No pancreatic ductal dilatation or surrounding inflammatory changes. Spleen: Normal in size without focal abnormality. Adrenals/Urinary Tract: Adrenal glands are unremarkable. Kidneys are normal, without renal calculi, focal lesion, or hydronephrosis. Bladder is unremarkable. Stomach/Bowel: Patient is status post sigmoidectomy with left lower quadrant colostomy. There is no evidence for bowel obstruction. There is no free intraperitoneal air. The appendix is within normal limits. There are small bowel loops in the right abdomen with thickening with associated mesenteric edema. Stomach is within normal limits. Vascular/Lymphatic: Aortic atherosclerosis. No enlarged abdominal or pelvic lymph nodes. Reproductive: Uterus and bilateral adnexa are unremarkable. Other: There is mild presacral edema. There is no ascites. There are no obvious fluid collections allowing for lack of intravenous and oral contrast. There is a new midline abdominal wall wound compatible with recent surgery. Percutaneous drainage catheter ends in the left lower quadrant. There is a small air-fluid collection in the subcutaneous tissues just medial to the ostomy. This collection measures 1.4 x 1.2 by 1.0 cm. There is no peristomal hernia. Musculoskeletal: Compression deformities of T12, L2, L3 and L4 are stable. Multilevel degenerative changes affect the  spine. IMPRESSION: 1. Status post sigmoid colectomy with left lower quadrant colostomy. No bowel obstruction. 2. Small bowel loops in the right abdomen with wall thickening and mesenteric edema worrisome for nonspecific enteritis. 3. Small subcutaneous air-fluid collection adjacent to the ostomy. Correlate for infection. 4. No definite intra-abdominal abscess identified allowing for lack of oral and intravenous contrast. 5. Cholelithiasis. 6.  Aortic Atherosclerosis (ICD10-I70.0). Electronically Signed   By: Ronney Asters M.D.   On: 06/17/2021 19:15   DG Chest Portable 1 View  Result Date: 06/19/2021 CLINICAL DATA:  Abdominal pain.  Hypoxia. EXAM: PORTABLE CHEST 1 VIEW COMPARISON:  Chest radiograph June 14, 2021. FINDINGS: Stable cardiac and mediastinal contours. Interval increase in perihilar interstitial opacities. No large area pulmonary consolidation. No pleural effusion or pneumothorax. IMPRESSION: Findings suggestive of mild interstitial edema. Electronically Signed   By: Lovey Newcomer M.D.   On: 06/19/2021 10:40  Review of Systems  Constitutional:  Negative for chills.  Respiratory:  Positive for shortness of breath.   Cardiovascular:  Negative for chest pain and leg swelling.  Gastrointestinal: Negative.   Genitourinary: Negative.   Musculoskeletal: Negative.    Blood pressure 136/77, pulse 76, temperature 98.8 F (37.1 C), temperature source Oral, resp. rate 17, SpO2 96 %. Physical Exam Constitutional:      Appearance: She is well-developed.  HENT:     Head: Normocephalic.  Cardiovascular:     Rate and Rhythm: Normal rate and regular rhythm.  Pulmonary:     Effort: Pulmonary effort is normal.     Breath sounds: No wheezing.  Abdominal:       Comments: Complete evisceration with visible bowel and omentum in the patient's lap.  No evidence of necrosis.  Musculoskeletal:        General: Normal range of motion.  Skin:    General: Skin is warm.  Neurological:     General:  No focal deficit present.     Mental Status: She is alert.  Psychiatric:        Mood and Affect: Mood normal.        Behavior: Behavior normal.     Assessment/Plan 9 days status post exporter laparotomy with sigmoid colectomy, colostomy for diverticulitis with history of lupus on chronic steroids now with complete abdominal wall dehiscence  She requires emergent return to the operating room for attempted closure.  Explained to her given the amount of time this is happened, closure can be difficult and may require the use of mesh's and/or biological style mesh is to cover this.  She will more than likely require hernia surgery down the road but with a colostomy closure will be more complex and difficult.  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  She is on chronic steroids and I have asked that medicine help manage her chronic conditions.  She requires emergent return due to complete dehiscence.  This will be complex.  She will multiple complications including possible bowel obstruction, hernia, fistula formation, wound complications, cardiovascular events, pulmonary events, death, DVT, and is a high risk of infection from all this given her chronic immunocompromise state to her underlying connective tissue disorders.  Retention sutures might be required.  Will be to the OR soon as possible to better assess her situation and try to cover and/or repair her abdominal wall to the best of her ability but she has a high risk of failure given her underlying problems and the amount of dehiscence.  She is aware of all these issues.  She also smokes and I strongly encouraged her to quit due to her underlying issues and complications.  She agrees to proceed.  Turner Daniels, MD 06/19/2021, 11:05 AM

## 2021-06-19 NOTE — Transfer of Care (Signed)
Immediate Anesthesia Transfer of Care Note  Patient: Jamiah Homeyer  Procedure(s) Performed: EXPLORATORY LAPAROTOMY (Abdomen)  Patient Location: PACU  Anesthesia Type:General  Level of Consciousness: awake and patient cooperative  Airway & Oxygen Therapy: Patient Spontanous Breathing and Patient connected to face mask  Post-op Assessment: Report given to RN and Post -op Vital signs reviewed and stable  Post vital signs: Reviewed and stable  Last Vitals:  Vitals Value Taken Time  BP 169/74 06/19/21 1303  Temp    Pulse    Resp 19 06/19/21 1305  SpO2    Vitals shown include unvalidated device data.  Last Pain:  Vitals:   06/19/21 0929  TempSrc:   PainSc: 8          Complications: No notable events documented.

## 2021-06-19 NOTE — Anesthesia Procedure Notes (Signed)
Procedure Name: Intubation Date/Time: 06/19/2021 11:50 AM Performed by: Claudia Desanctis, CRNA Pre-anesthesia Checklist: Patient identified, Emergency Drugs available, Suction available and Patient being monitored Patient Re-evaluated:Patient Re-evaluated prior to induction Oxygen Delivery Method: Circle system utilized Preoxygenation: Pre-oxygenation with 100% oxygen Induction Type: IV induction Ventilation: Mask ventilation without difficulty Laryngoscope Size: 2 and Miller Grade View: Grade I Tube type: Oral Tube size: 7.0 mm Number of attempts: 1 Airway Equipment and Method: Stylet Placement Confirmation: ETT inserted through vocal cords under direct vision, positive ETCO2 and breath sounds checked- equal and bilateral Secured at: 21 cm Tube secured with: Tape Dental Injury: Teeth and Oropharynx as per pre-operative assessment

## 2021-06-19 NOTE — ED Triage Notes (Signed)
Pt states she took 2 of her prescribed pain medication last night, 5mg  IR oxycodone according to dc paperwork at bedside.

## 2021-06-19 NOTE — Progress Notes (Signed)
A consult was received from an ED physician for Zosyn per pharmacy dosing.  The patient's profile has been reviewed for ht/wt/allergies/indication/available labs.    A one time order has been placed for Zosyn 3.375gm x1.  Further antibiotics/pharmacy consults should be ordered by admitting physician if indicated.                       Thank you,  Minda Ditto PharmD 06/19/2021  10:37 AM

## 2021-06-19 NOTE — ED Provider Notes (Signed)
[facility] DEPT Provider Note   CSN: [id number] Arrival date & time: 06/19/21  0901     History Chief Complaint  Patient presents with  . Abdominal Pain    Brittany Hobbs is a 60 y.o. female with a past medical history significant for hyperlipidemia, Lupus, panic attacks, history of diverticulitis, and arthritis who presents to the ED due to concerns about "organs falling out of her abdomen" that started earlier this morning. Chart reviewed.  Patient was recently admitted to the hospital in 9/16-10/1 due to diverticulitis complicated by perforation.  Patient had a sigmoid colon resection with end colostomy on 9/23 by Dr. Zenia Resides.  She also had a right total knee arthroplasty on 9/2 by Dr. Ninfa Linden. Patient states she was bending over the clean up cat litter when she felt like "everything was falling out of her abdomen". She admits to some "pulling" abdominal pain. She took 2 oxycodone last night, but none this morning. No fever or chills. Good output from colostomy.  Denies urinary symptoms.  Denies associated chest pain and shortness of breath.  No lower extremity edema.  Of note, patient was found to be intermittently hypoxic while sleeping.  No history of sleep apnea.  Patient continuously dozes off during my initial evaluation.  Denies any pain medications earlier today. No treatment prior to arrival.   History obtained from patient and past medical records. No interpreter used during encounter.       Past Medical History:  Diagnosis Date  . Arthritis   . Asthma   . Diverticulitis   . Hyperlipidemia   . Lupus ([place])   . Panic attacks   . Pneumonia 09/2020   with covid  . PONV (postoperative nausea and vomiting)   . Seasonal allergies     Patient Active Problem List   Diagnosis Date Noted  . Dehiscence of postoperative wound of abdomen 06/19/2021  . Intra-abdominal abscess ([place])   . Status post right knee replacement 05/20/2021  . Unilateral  primary osteoarthritis, right knee 05/19/2021  . Effusion of right knee 02/03/2021  . Acquired trigger finger of right ring finger 10/26/2020  . Acute diverticulitis 09/30/2019  . E coli bacteremia 09/30/2019  . Thrombocytopenia ([place]) 09/30/2019  . AKI (acute kidney injury) ([place]) 09/30/2019  . LFT elevation 09/30/2019  . Bacteriuria 08/11/2018  . Hydronephrosis, left 08/11/2018  . Diverticulitis 08/10/2018  . Colonic diverticular abscess 08/10/2018  . Lupus ([place]) 08/10/2018  . Hyperlipidemia 08/10/2018  . HTN (hypertension) 08/10/2018  . Anxiety 08/10/2018    Past Surgical History:  Procedure Laterality Date  . COLON RESECTION SIGMOID N/A 06/10/2021   Procedure: OPEN SIGMOID COLON RESECTION WITH END COLOSTOMY AND ABDOMINAL WASHOUT;  Surgeon: Dwan Bolt, MD;  Location: [facility];  Service: General;  Laterality: N/A;  . CYSTOSCOPY WITH STENT PLACEMENT  06/10/2021   Procedure: CYSTOSCOPY WITH LEFT STENT PLACEMENT;  Surgeon: Dwan Bolt, MD;  Location: [facility];  Service: General;;  . PATELLA FRACTURE SURGERY Left   . scar tissue eye  1983   right  . TOTAL KNEE ARTHROPLASTY Right 05/20/2021   Procedure: RIGHT TOTAL KNEE ARTHROPLASTY;  Surgeon: Mcarthur Rossetti, MD;  Location: [facility];  Service: Orthopedics;  Laterality: Right;  Needs RNFA  . TUBAL LIGATION  1985  . WRIST FRACTURE SURGERY Right      OB History   No obstetric history on file.     Family History  Problem Relation Age of Onset  . Colon cancer  Neg Hx   . Stomach cancer Neg Hx   . Esophageal cancer Neg Hx   . Rectal cancer Neg Hx     Social History   Tobacco Use  . Smoking status: Every Day    Packs/day: 0.30    Years: 30.00    Pack years: 9.00    Types: Cigarettes  . Smokeless tobacco: Never  . Tobacco comments:    Trying to quit  Vaping Use  . Vaping Use: Never used  Substance Use Topics  . Alcohol use: No  . Drug use: Yes    Frequency: 7.0 times per week    Types: Marijuana    Comment:  CBD    Home Medications Prior to Admission medications   Medication Sig Start Date End Date Taking? Authorizing Provider  LORazepam (ATIVAN) 0.5 MG tablet Take 0.5-1 mg by mouth See admin instructions. 0.5mg  in AM and 1mg  in evening 09/03/19  Yes [provider]  albuterol (VENTOLIN HFA) 108 (90 Base) MCG/ACT inhaler Inhale 2 puffs into the lungs every 6 (six) hours as needed for wheezing or shortness of breath.    [provider]  aspirin 81 MG chewable tablet Chew 1 tablet (81 mg total) by mouth 2 (two) times daily. 05/21/21   Mcarthur Rossetti, MD  carboxymethylcellulose (REFRESH PLUS) 0.5 % SOLN Place 1 drop into both eyes 3 (three) times daily as needed (dry eyes).    [provider]  ciprofloxacin (CIPRO) 500 MG tablet Take 1 tablet (500 mg total) by mouth 2 (two) times daily for 14 days. 06/18/21 07/02/21  Rolm Bookbinder, MD  EC-NAPROXEN 500 MG EC tablet Take 500 mg by mouth 2 (two) times daily. 05/03/21   [provider]  escitalopram (LEXAPRO) 20 MG tablet Take 20 mg by mouth daily.    [provider]  fluconazole (DIFLUCAN) 200 MG tablet Take 4 tablets (800 mg total) by mouth daily for 14 days. 06/19/21 07/03/21  Rolm Bookbinder, MD  gabapentin (NEURONTIN) 400 MG capsule Take 800 mg by mouth 3 (three) times daily.    [provider]  HYDROcodone-acetaminophen (NORCO/VICODIN) 5-325 MG tablet Take 1-2 tablets by mouth every 6 (six) hours as needed for moderate pain. 06/01/21   Mcarthur Rossetti, MD  metoprolol tartrate (LOPRESSOR) 25 MG tablet Take 12.5 mg by mouth daily. 12/27/20   [provider]  metroNIDAZOLE (FLAGYL) 500 MG tablet Take 1 tablet (500 mg total) by mouth 2 (two) times daily for 14 days. 06/18/21 07/02/21  Rolm Bookbinder, MD  nystatin (MYCOSTATIN) 100000 UNIT/ML suspension Take 5 mLs by mouth 4 (four) times daily as needed (dry mouth). 02/04/21   [provider]  ondansetron (ZOFRAN ODT)  4 MG disintegrating tablet Take 1 tablet (4 mg total) by mouth every 8 (eight) hours as needed for nausea or vomiting. 05/24/21   Mcarthur Rossetti, MD  oxyCODONE (OXY IR/ROXICODONE) 5 MG immediate release tablet Take 1 tablet (5 mg total) by mouth every 6 (six) hours as needed for moderate pain, severe pain or breakthrough pain. 06/18/21   Rolm Bookbinder, MD  predniSONE (DELTASONE) 10 MG tablet Take 10 mg by mouth daily with breakfast.    [provider]  tiZANidine (ZANAFLEX) 4 MG tablet Take 1 tablet (4 mg total) by mouth every 8 (eight) hours as needed for muscle spasms. 05/21/21   Mcarthur Rossetti, MD    Allergies    Contrast media [iodinated diagnostic agents], Iodine, Betadine [povidone iodine], Methocarbamol, and Sulfa antibiotics  Review of Systems   Review of Systems  Constitutional:  Negative for chills and fever.  Respiratory:  Negative for cough and shortness of breath.   Cardiovascular:  Negative for chest pain.  Gastrointestinal:  Positive for abdominal pain. Negative for nausea and vomiting.  Genitourinary:  Negative for dysuria.  All other systems reviewed and are negative.  Physical Exam Updated Vital Signs BP (!) 186/93   Pulse 76   Temp 98.5 F (36.9 C)   Resp (!) 21   SpO2 100%   Physical Exam Vitals and nursing note reviewed.  Constitutional:      General: She is not in acute distress.    Appearance: She is not ill-appearing.  HENT:     Head: Normocephalic.  Eyes:     Pupils: Pupils are equal, round, and reactive to light.  Cardiovascular:     Rate and Rhythm: Normal rate and regular rhythm.     Pulses: Normal pulses.     Heart sounds: Normal heart sounds. No murmur heard.   No friction rub. No gallop.  Pulmonary:     Effort: Pulmonary effort is normal.     Breath sounds: Normal breath sounds.     Comments: 2L [place] Abdominal:     General: Abdomen is flat. There is no distension.     Palpations: Abdomen is soft.     Tenderness:  There is no abdominal tenderness. There is no guarding or rebound.     Comments: Diffuse tenderness. Omentum appears to be protruding from abdomen. Brown output from colostomy.   Musculoskeletal:        General: Normal range of motion.     Cervical back: Neck supple.  Skin:    General: Skin is warm and dry.  Neurological:     General: No focal deficit present.     Mental Status: She is alert.  Psychiatric:        Mood and Affect: Mood normal.        Behavior: Behavior normal.       ED Results / Procedures / Treatments   Labs (all labs ordered are listed, but only abnormal results are displayed) Labs Reviewed  CBC WITH DIFFERENTIAL/PLATELET - Abnormal; Notable for the following components:      Result Value   WBC 17.7 (*)    Hemoglobin 11.6 (*)    RDW 17.0 (*)    Platelets 505 (*)    Neutro Abs 15.7 (*)    Abs Immature Granulocytes 0.08 (*)    All other components within normal limits  COMPREHENSIVE METABOLIC PANEL - Abnormal; Notable for the following components:   Chloride 96 (*)    Glucose, Bld 129 (*)    Calcium 8.4 (*)    Albumin 2.9 (*)    All other components within normal limits  RESP PANEL BY RT-PCR (FLU A&B, COVID) ARPGX2  LIPASE, BLOOD  BRAIN NATRIURETIC PEPTIDE  URINALYSIS, ROUTINE W REFLEX MICROSCOPIC    EKG None  Radiology CT ABDOMEN PELVIS WO CONTRAST  Result Date: 06/17/2021 CLINICAL DATA:  Evaluate for intra-abdominal abscess. Status post diverticulitis with perforation. Status post exploratory laparotomy with washout in sigmoid colectomy with end colostomy. Stent placed 06/10/2021. EXAM: CT ABDOMEN AND PELVIS WITHOUT CONTRAST TECHNIQUE: Multidetector CT imaging of the abdomen and pelvis was performed following the standard protocol without IV contrast. COMPARISON:  CT abdomen and pelvis 06/08/2021. FINDINGS: Lower chest: There is minimal atelectasis or scarring in the lung bases. Hepatobiliary: Multiple gallstones are again seen. There is no  biliary  ductal dilatation. Bile ducts are within normal limits. Pancreas: Unremarkable. No pancreatic ductal dilatation or surrounding inflammatory changes. Spleen: Normal in size without focal abnormality. Adrenals/Urinary Tract: Adrenal glands are unremarkable. Kidneys are normal, without renal calculi, focal lesion, or hydronephrosis. Bladder is unremarkable. Stomach/Bowel: Patient is status post sigmoidectomy with left lower quadrant colostomy. There is no evidence for bowel obstruction. There is no free intraperitoneal air. The appendix is within normal limits. There are small bowel loops in the right abdomen with thickening with associated mesenteric edema. Stomach is within normal limits. Vascular/Lymphatic: Aortic atherosclerosis. No enlarged abdominal or pelvic lymph nodes. Reproductive: Uterus and bilateral adnexa are unremarkable. Other: There is mild presacral edema. There is no ascites. There are no obvious fluid collections allowing for lack of intravenous and oral contrast. There is a new midline abdominal wall wound compatible with recent surgery. Percutaneous drainage catheter ends in the left lower quadrant. There is a small air-fluid collection in the subcutaneous tissues just medial to the ostomy. This collection measures 1.4 x 1.2 by 1.0 cm. There is no peristomal hernia. Musculoskeletal: Compression deformities of T12, L2, L3 and L4 are stable. Multilevel degenerative changes affect the spine. IMPRESSION: 1. Status post sigmoid colectomy with left lower quadrant colostomy. No bowel obstruction. 2. Small bowel loops in the right abdomen with wall thickening and mesenteric edema worrisome for nonspecific enteritis. 3. Small subcutaneous air-fluid collection adjacent to the ostomy. Correlate for infection. 4. No definite intra-abdominal abscess identified allowing for lack of oral and intravenous contrast. 5. Cholelithiasis. 6.  Aortic Atherosclerosis (ICD10-I70.0). Electronically Signed   By: Ronney Asters M.D.   On: 06/17/2021 19:15   DG Chest Portable 1 View  Result Date: 06/19/2021 CLINICAL DATA:  Abdominal pain.  Hypoxia. EXAM: PORTABLE CHEST 1 VIEW COMPARISON:  Chest radiograph June 14, 2021. FINDINGS: Stable cardiac and mediastinal contours. Interval increase in perihilar interstitial opacities. No large area pulmonary consolidation. No pleural effusion or pneumothorax. IMPRESSION: Findings suggestive of mild interstitial edema. Electronically Signed   By: Lovey Newcomer M.D.   On: 06/19/2021 10:40    Procedures .Critical Care Performed by: Suzy Bouchard, PA-C Authorized by: Suzy Bouchard, PA-C   Critical care provider statement:    Critical care time (minutes):  35   Critical care was time spent personally by me on the following activities:  Discussions with consultants, evaluation of patient's response to treatment, examination of patient, ordering and performing treatments and interventions, ordering and review of laboratory studies, ordering and review of radiographic studies, pulse oximetry, re-evaluation of patient's condition, obtaining history from patient or surrogate and review of old charts   I assumed direction of critical care for this patient from another provider in my specialty: no     Care discussed with: admitting provider     Medications Ordered in ED Medications  fentaNYL (SUBLIMAZE) injection 25-50 mcg (25 mcg Intravenous Given 06/19/21 1308)  acetaminophen (OFIRMEV) IV 1,000 mg (1,000 mg Intravenous New Bag/Given 06/19/21 1305)  amisulpride (BARHEMSYS) injection 10 mg (has no administration in time range)  0.9 % irrigation (POUR BTL) (2,000 mLs Irrigation Given 06/19/21 1239)  fentaNYL (SUBLIMAZE) 50 MCG/ML injection (has no administration in time range)  acetaminophen (OFIRMEV) 10 MG/ML IV (has no administration in time range)  piperacillin-tazobactam (ZOSYN) IVPB 3.375 g (3.375 g Intravenous Given 06/19/21 1203)    ED Course  I have reviewed  the triage vital signs and the nursing notes.  Pertinent labs & imaging results that were available  during my care of the patient were reviewed by me and considered in my medical decision making (see chart for details).    MDM Rules/Calculators/A&P                          60 year old female presents to the ED due to abdominal wall dehiscence. She had a recent colon resection with colostomy on 9/23. Patient was also intermittently hypoxic during evaluation mostly while sleeping. No history of sleep apnea.   10:23 AM Discussed with Dr. Brantley Stage with general surgery who will see patient. He recommends hospitalist consultation to manage chronic issues and further work-up hypoxia. COVID test ordered.   Patient taken emergently to the OR. Labs still pending. Unable to obtain CTA to rule out PE given anaphylactic reaction to IV contrast dye and emergent condition needing immediate surgery.   Discussed with Dr. Marylyn Ishihara with [facility] who recommends re-consulting hospitalist service once patient is finished in the OR. Hospitalist group is happy to consult on patient.  Final Clinical Impression(s) / ED Diagnoses Final diagnoses:  Generalized abdominal pain    Rx / DC Orders ED Discharge Orders     None        Suzy Bouchard, PA-C 06/19/21 1338    Gareth Morgan, MD 06/20/21 0002

## 2021-06-20 ENCOUNTER — Encounter (HOSPITAL_COMMUNITY): Payer: Self-pay | Admitting: Surgery

## 2021-06-20 DIAGNOSIS — K651 Peritoneal abscess: Secondary | ICD-10-CM | POA: Diagnosis not present

## 2021-06-20 DIAGNOSIS — T8131XA Disruption of external operation (surgical) wound, not elsewhere classified, initial encounter: Secondary | ICD-10-CM | POA: Diagnosis not present

## 2021-06-20 LAB — URINALYSIS, ROUTINE W REFLEX MICROSCOPIC
Bilirubin Urine: NEGATIVE
Glucose, UA: NEGATIVE mg/dL
Hgb urine dipstick: NEGATIVE
Ketones, ur: NEGATIVE mg/dL
Leukocytes,Ua: NEGATIVE
Nitrite: NEGATIVE
Protein, ur: NEGATIVE mg/dL
Specific Gravity, Urine: 1.017 (ref 1.005–1.030)
pH: 5 (ref 5.0–8.0)

## 2021-06-20 MED ORDER — SODIUM CHLORIDE 0.9 % IV SOLN
2.0000 g | INTRAVENOUS | Status: DC
Start: 2021-06-20 — End: 2021-06-21
  Administered 2021-06-20: 2 g via INTRAVENOUS
  Filled 2021-06-20 (×3): qty 20

## 2021-06-20 MED ORDER — ANIDULAFUNGIN 100 MG IV SOLR
200.0000 mg | INTRAVENOUS | Status: AC
Start: 2021-06-20 — End: 2021-06-20
  Administered 2021-06-20: 200 mg via INTRAVENOUS
  Filled 2021-06-20: qty 200

## 2021-06-20 MED ORDER — METRONIDAZOLE 500 MG/100ML IV SOLN
500.0000 mg | Freq: Two times a day (BID) | INTRAVENOUS | Status: DC
  Administered 2021-06-20 – 2021-06-21 (×2): 500 mg via INTRAVENOUS
  Filled 2021-06-20 (×2): qty 100

## 2021-06-20 MED ORDER — SODIUM CHLORIDE 0.9 % IV SOLN
100.0000 mg | INTRAVENOUS | Status: DC
Start: 2021-06-21 — End: 2021-06-21

## 2021-06-20 NOTE — Progress Notes (Signed)
[facility] for Infectious Disease  Date of Admission:  06/19/2021      Lines: Peripheral iv's  Abx: 10/03-c ceftriaxone 10/03-c flagyl 10/03-c eraxis  ASSESSMENT: Abd wound dehiscence mechanical s/p repair 26/83 Complicated diverticulitis/abd abscess s/p hartman's 9/23 Ct contrast allergy   Pending glabrata susceptibility from last admission Patient has been receiving appropriate abx starting 9/30.   Repeat abd ct 9/30 without abscess. Planned 2 weeks abx coverage from 9/30  Her dehiscence will not affect abx plan  PLAN: Can transition IV abx back to oral (levofloxacin, metronidazole, and high dose fluconazole) once patient's intake is better  restart ceftriaxone/flagyl/eraxis for now Duration is until 10/13 She has ID f/u as previously arranged, on 10/12 @ 345 at [facility] clinic with me   I spent more than 35 minute reviewing data/chart, and coordinating care and >50% direct face to face time providing counseling/discussing diagnostics/treatment plan with patient  Active Problems:   Dehiscence of postoperative wound of abdomen   Allergies  Allergen Reactions   Contrast Media [Iodinated Diagnostic Agents] Anaphylaxis   Iodine Anaphylaxis    Pt states she is allergic to INTERNAL IODINE   Betadine [Povidone Iodine] Hives   Methocarbamol Nausea Only    Felt nauseated and sick   Sulfa Antibiotics     N/V    Scheduled Meds:  Chlorhexidine Gluconate Cloth  6 each Topical Once   enoxaparin (LOVENOX) injection  40 mg Subcutaneous Q24H   escitalopram  20 mg Oral Daily   LORazepam  0.5 mg Oral Daily   And   LORazepam  1 mg Oral QHS   metoprolol tartrate  12.5 mg Oral Daily   Continuous Infusions:  anidulafungin     Followed by   Derrill Memo ON 06/21/2021] anidulafungin     cefTRIAXone (ROCEPHIN)  IV 2 g (06/20/21 1254)   dextrose 5 % and 0.9% NaCl 100 mL/hr at 06/19/21 1553   metronidazole     PRN Meds:.albuterol, diphenhydrAMINE **OR**  diphenhydrAMINE, HYDROcodone-acetaminophen, HYDROmorphone (DILAUDID) injection, [EXPIRED] ketorolac **FOLLOWED BY** ketorolac, metoprolol tartrate, ondansetron **OR** ondansetron (ZOFRAN) IV, polyvinyl alcohol, traMADol   SUBJECTIVE: Patient readmitted 10/02 after mechanical dehiscence of her wound She is s/p repair Eating some po Feels well  Abx not restarted yet this readmission No n/v/diarrhea No f/c  Review of Systems: ROS All other ROS was negative, except mentioned above     OBJECTIVE: Vitals:   06/20/21 0129 06/20/21 0425 06/20/21 0955 06/20/21 1331  BP: (!) 142/69 (!) 164/80 (!) 142/69 140/79  Pulse: 67 70 67 70  Resp:   18 16  Temp: 97.7 F (36.5 C) 99.1 F (37.3 C) 98.1 F (36.7 C) 98.4 F (36.9 C)  TempSrc: Oral Oral Oral   SpO2: 100% 100% 94% 95%  Weight:      Height:       Body mass index is 23.81 kg/m.  Physical Exam General/constitutional: no distress, pleasant HEENT: Normocephalic, PER, Conj Clear, EOMI, Oropharynx clear Neck supple CV: rrr no mrg Lungs: clear to auscultation, normal respiratory effort Abd: Soft, Nontender; midline wound suture intact; no purulence in wound bed; osteomy functioning soft stool Ext: no edema Skin: No Rash Neuro: nonfocal MSK: no peripheral joint swelling/tenderness/warmth; back spines nontender    Lab Results Lab Results  Component Value Date   WBC 17.7 (H) 06/19/2021   HGB 11.6 (L) 06/19/2021   HCT 38.1 06/19/2021   MCV 89.4 06/19/2021   PLT 505 (H) 06/19/2021    Lab Results  Component Value Date   CREATININE 0.76 06/19/2021   BUN 12 06/19/2021   NA 136 06/19/2021   K 4.9 06/19/2021   CL 96 (L) 06/19/2021   CO2 32 06/19/2021    Lab Results  Component Value Date   ALT 11 06/19/2021   AST 24 06/19/2021   ALKPHOS 71 06/19/2021   BILITOT 0.7 06/19/2021      Microbiology: Recent Results (from the past 240 hour(s))  Aerobic/Anaerobic Culture w Gram Stain (surgical/deep wound)     Status: None  (Preliminary result)   Collection Time: 06/10/21  4:07 PM   Specimen: PATH Other; Tissue  Result Value Ref Range Status   Specimen Description   Final    ABSCESS PERITONEAL Performed at [facility], [place] [address] [place], [place] [address]    Special Requests   Final    NONE Performed at [facility], [place] [address], [place], [place] [address]    Gram Stain   Final    FEW SQUAMOUS EPITHELIAL CELLS PRESENT FEW WBC SEEN FEW GRAM NEGATIVE RODS    Culture   Final    FEW KLEBSIELLA PNEUMONIAE FEW CANDIDA GLABRATA Sent to [place] [facility] for further susceptibility testing. NO ANAEROBES ISOLATED Performed at [facility], [place] [address] [place], [place] [address]    Report Status PENDING  Incomplete   Organism ID, Bacteria KLEBSIELLA PNEUMONIAE  Final      Susceptibility   Klebsiella pneumoniae - MIC*    AMPICILLIN >=32 RESISTANT Resistant     CEFAZOLIN >=64 RESISTANT Resistant     CEFEPIME 1 SENSITIVE Sensitive     CEFTAZIDIME 4 SENSITIVE Sensitive     CEFTRIAXONE <=0.25 SENSITIVE Sensitive     CIPROFLOXACIN <=0.25 SENSITIVE Sensitive     GENTAMICIN <=1 SENSITIVE Sensitive     IMIPENEM <=0.25 SENSITIVE Sensitive     TRIMETH/SULFA <=20 SENSITIVE Sensitive     AMPICILLIN/SULBACTAM >=32 RESISTANT Resistant     PIP/TAZO >=128 RESISTANT Resistant     * FEW KLEBSIELLA PNEUMONIAE  Resp Panel by RT-PCR (Flu A&B, Covid) Nasopharyngeal Swab     Status: None   Collection Time: 06/19/21 11:02 AM   Specimen: Nasopharyngeal Swab; Nasopharyngeal(NP) swabs in vial transport medium  Result Value Ref Range Status   SARS Coronavirus 2 by RT PCR NEGATIVE NEGATIVE Final    Comment: (NOTE) SARS-CoV-2 target nucleic acids are NOT DETECTED.  The SARS-CoV-2 RNA is generally detectable in upper respiratory specimens during the acute phase of infection. The lowest concentration of SARS-CoV-2 viral copies this assay can detect is 138 copies/mL. A negative  result does not preclude SARS-Cov-2 infection and should not be used as the sole basis for treatment or other patient management decisions. A negative result may occur with  improper specimen collection/handling, submission of specimen other than nasopharyngeal swab, presence of viral mutation(s) within the areas targeted by this assay, and inadequate number of viral copies(<138 copies/mL). A negative result must be combined with clinical observations, patient history, and epidemiological information. The expected result is Negative.  Fact Sheet for Patients:  EntrepreneurPulse.com.au  Fact Sheet for Healthcare Providers:  IncredibleEmployment.be  This test is no t yet approved or cleared by the [place] FDA and  has been authorized for detection and/or diagnosis of SARS-CoV-2 by FDA under an Emergency Use Authorization (EUA). This EUA will remain  in effect (meaning this test can be used) for the duration of the COVID-19 declaration under Section 564(b)(1) of the Act, 21 U.S.C.section 360bbb-3(b)(1),  unless the authorization is terminated  or revoked sooner.       Influenza A by PCR NEGATIVE NEGATIVE Final   Influenza B by PCR NEGATIVE NEGATIVE Final    Comment: (NOTE) The Xpert Xpress SARS-CoV-2/FLU/RSV plus assay is intended as an aid in the diagnosis of influenza from Nasopharyngeal swab specimens and should not be used as a sole basis for treatment. Nasal washings and aspirates are unacceptable for Xpert Xpress SARS-CoV-2/FLU/RSV testing.  Fact Sheet for Patients: EntrepreneurPulse.com.au  Fact Sheet for Healthcare Providers: IncredibleEmployment.be  This test is not yet approved or cleared by the [place] FDA and has been authorized for detection and/or diagnosis of SARS-CoV-2 by FDA under an Emergency Use Authorization (EUA). This EUA will remain in effect (meaning this test can be used)  for the duration of the COVID-19 declaration under Section 564(b)(1) of the Act, 21 U.S.C. section 360bbb-3(b)(1), unless the authorization is terminated or revoked.  Performed at [facility], [place] [address], [place], [place] [address]      Serology:   Imaging: If present, new imagings (plain films, ct scans, and mri) have been personally visualized and interpreted; radiology reports have been reviewed. Decision making incorporated into the Impression / Recommendations.  9/30 abd pelv ct noncontrast 1. Status post sigmoid colectomy with left lower quadrant colostomy. No bowel obstruction. 2. Small bowel loops in the right abdomen with wall thickening and mesenteric edema worrisome for nonspecific enteritis. 3. Small subcutaneous air-fluid collection adjacent to the ostomy. Correlate for infection. 4. No definite intra-abdominal abscess identified allowing for lack of oral and intravenous contrast. 5. Cholelithiasis. 6.  Aortic Atherosclerosis  Jabier Mutton, [place] for Infectious [place] [phone] pager    06/20/2021, 2:47 PM

## 2021-06-20 NOTE — Progress Notes (Signed)
Patient did not get IV access until after 1300 today.    I gave 1 antibiotic, because all 3 of these cannot be given at the same time.    Patient also has poor venous access. Requested pharmacy to reschedule doses, but past doses can not be rescheduled.    Clarification received at 1515. Will give medications as scheduled moving forward now that all 3 are not scheduled at the same time.    [place], Therapist, sports

## 2021-06-20 NOTE — Progress Notes (Signed)
Progress Note  1 Day Post-Op  Subjective: CC: no complaints this am Was napping at time of my arrival. She is tolerating clears well and pain well controlled. She has had stool output into ostomy. She denies nausea, emesis, abdominal complaints  Objective: Vital signs in last 24 hours: Temp:  [97.6 F (36.4 C)-99.1 F (37.3 C)] 98.1 F (36.7 C) (10/03 0955) Pulse Rate:  [59-80] 67 (10/03 0955) Resp:  [12-30] 18 (10/03 0955) BP: (114-186)/(69-99) 142/69 (10/03 0955) SpO2:  [91 %-100 %] 94 % (10/03 0955) Weight:  [64.9 kg] 64.9 kg (10/02 1649)    Intake/Output from previous day: 10/02 0701 - 10/03 0700 In: 2820.2 [I.V.:2670.2; IV Piggyback:150] Out: 1000 [Urine:1000] Intake/Output this shift: Total I/O In: 120 [P.O.:120] Out: 800 [Urine:800]  PE: General: pleasant, WD, female who is laying in bed in NAD HEENT: head is normocephalic, atraumatic.  Mouth is pink and moist Heart: regular, rate, and rhythm. Palpable radial and pedal pulses bilaterally Lungs: CTAB.  Respiratory effort nonlabored Abd: soft, ND, +BS, Midline wound with retention sutures intact - no surrounding erythema, no purulent discharge, appropriate bleeding. Ostomy with soft brown stool MSK: all 4 extremities are symmetrical with no cyanosis, clubbing, or edema. Skin: warm and dry with no masses, lesions, or rashes Psych: A&Ox3 with an appropriate affect.    Lab Results:  Recent Labs    06/19/21 1044  WBC 17.7*  HGB 11.6*  HCT 38.1  PLT 505*   BMET Recent Labs    06/19/21 1044  NA 136  K 4.9  CL 96*  CO2 32  GLUCOSE 129*  BUN 12  CREATININE 0.76  CALCIUM 8.4*   PT/INR No results for input(s): LABPROT, INR in the last 72 hours. CMP     Component Value Date/Time   NA 136 06/19/2021 1044   K 4.9 06/19/2021 1044   CL 96 (L) 06/19/2021 1044   CO2 32 06/19/2021 1044   GLUCOSE 129 (H) 06/19/2021 1044   BUN 12 06/19/2021 1044   CREATININE 0.76 06/19/2021 1044   CALCIUM 8.4 (L)  06/19/2021 1044   PROT 6.6 06/19/2021 1044   ALBUMIN 2.9 (L) 06/19/2021 1044   AST 24 06/19/2021 1044   ALT 11 06/19/2021 1044   ALKPHOS 71 06/19/2021 1044   BILITOT 0.7 06/19/2021 1044   GFRNONAA >60 06/19/2021 1044   GFRAA >60 10/02/2019 0445   Lipase     Component Value Date/Time   LIPASE 23 06/19/2021 1044       Studies/Results: DG Chest Portable 1 View  Result Date: 06/19/2021 CLINICAL DATA:  Abdominal pain.  Hypoxia. EXAM: PORTABLE CHEST 1 VIEW COMPARISON:  Chest radiograph June 14, 2021. FINDINGS: Stable cardiac and mediastinal contours. Interval increase in perihilar interstitial opacities. No large area pulmonary consolidation. No pleural effusion or pneumothorax. IMPRESSION: Findings suggestive of mild interstitial edema. Electronically Signed   By: Lovey Newcomer M.D.   On: 06/19/2021 10:40    Anti-infectives: Anti-infectives (From admission, onward)    Start     Dose/Rate Route Frequency Ordered Stop   06/21/21 1000  anidulafungin (ERAXIS) 100 mg in sodium chloride 0.9 % 100 mL IVPB       See Hyperspace for full Linked Orders Report.   100 mg 78 mL/hr over 100 Minutes Intravenous Every 24 hours 06/20/21 1011 07/02/21 0959   06/20/21 1100  cefTRIAXone (ROCEPHIN) 2 g in sodium chloride 0.9 % 100 mL IVPB        2 g 200 mL/hr over 30 Minutes  Intravenous Every 24 hours 06/20/21 1011 07/02/21 1059   06/20/21 1100  metroNIDAZOLE (FLAGYL) IVPB 500 mg        500 mg 100 mL/hr over 60 Minutes Intravenous Every 12 hours 06/20/21 1011 07/02/21 0959   06/20/21 1100  anidulafungin (ERAXIS) 200 mg in sodium chloride 0.9 % 200 mL IVPB       See Hyperspace for full Linked Orders Report.   200 mg 78 mL/hr over 200 Minutes Intravenous Every 24 hours 06/20/21 1011 06/21/21 1059   06/20/21 0600  ceFAZolin (ANCEF) IVPB 2g/100 mL premix  Status:  Discontinued        2 g 200 mL/hr over 30 Minutes Intravenous On call to O.R. 06/19/21 1516 06/20/21 0455   06/19/21 1045   piperacillin-tazobactam (ZOSYN) IVPB 3.375 g        3.375 g 100 mL/hr over 30 Minutes Intravenous  Once 06/19/21 1036 06/19/21 1213        Assessment/Plan  POD 10 s/p Exploratory laparotomy with washout of intra-abdominal abscess, sigmoid colectomy, end colostomy, placement of intra-abdominal drain for Perforated sigmoid diverticulitis with pelvic abscess by Dr. Zenia Resides on 9/23 POD 1 s/p ex lap with closure of fascia using retention sutures  for complete abdominal wall dehiscence by Dr. Brantley Stage 10/2 - Cont abx. Initial cx's w/ Kleb PNA and candida glabrata, resistant to zosyn. Sensitive to ceftriaxone, flagyl. continue eraxis and IV abx while inpatient - ID involved during previous admission with plans at discharge for 2 weeks of ciprofloxacin 500 mg po bid, metronidazole 500 mg po bid, and fluconazole 800 mg po daily until 10/12 - BID WTD midline wound, abdominal binder - Mobilize, PT/OT - Pulm toilet - advance diet to regular  FEN - regular VTE - SCDs, Lovenox ID - Zosyn 9/16 >9/27, ceftriaxone/flagyl 9/27>>, eraxis 9/29, 10/2>   Hx Lupus on steroids Possible OSA - sleep study outpatient Restless legs      LOS: 1 day    Winferd Humphrey, [facility] Surgery 06/20/2021, 10:59 AM Please see Amion for pager number during day hours 7:00am-4:30pm

## 2021-06-21 NOTE — Discharge Summary (Signed)
[facility] Discharge Summary   Patient ID: Brittany Hobbs MRN: [id number] DOB/AGE: 1961-01-24 60 y.o.  Admit date: 06/03/2021 Discharge date: 06/18/2021  Admitting Diagnosis: Acute diverticulitis w/ perforation Lupus  Discharge Diagnosis Diverticulitis with pelvic abscess  s/p exploratory laparotomy with washout of intra-abdominal abscess, sigmoid colectomy, end colostomy, placement of intra-abdominal drain Lupus Possible OSA  Consultants [facility] Infectious Disease - Dr. Gale Journey Urology - Dr. Abner Greenspan  Imaging: No results found.  Procedures Dr. Zenia Resides (06/10/21) - Exploratory laparotomy with washout of intra-abdominal abscess, sigmoid colectomy, end colostomy, placement of intra-abdominal drain Dr. Abner Greenspan (06/10/21) - cystoscopy, Left open ended externalized ureteral stent placement  Hospital Course:  60 year old female who presented to [facility] ED with abdominal pain.  Workup showed diverticulitis with perforation.  Patient was initially admitted to the hospitalist service for management and later transferred to our service. She was initially managed with medical management including bowel rest and IV abx but ultimately did not improve appropriately and went to the OR for procedures as above. Right ureteral stent removed immediately post op. She did have intra-abdominal abscess. Midline wound was left open to heal by secondary intention. Tolerated procedure well and was transferred to the floor.  Diet was advanced as tolerated while monitoring bowel function. She did have slow return of bowel function.  WOC was consulted for wound care and colostomy teaching. Operative cultures grew klebsiella pneumonia and candida glabrata and infectious disease was consulted and directed antibiotic and antifungal management. She had repeat CT scan post operatively on 06/17/21 which showed no definite intra-abdominal abscess. On POD8, the patient was voiding well, tolerating  diet, ambulating well, pain well controlled, vital signs stable, incision c/d/i with appropriate packing and felt stable for discharge home. She was comfortable with colostomy and wound care management and had home health arranged. Patient will follow up in our office in 3 weeks and knows to call with questions or concerns. She will follow up with infectious disease and primary care and gastroenterology.  I or a member of my team have reviewed this patient in the Controlled Substance Database.  I was not directly involved in this patient's care therefore the information in this discharge summary was taken from the chart.    Allergies as of 06/18/2021       Reactions   Contrast Media [iodinated Diagnostic Agents] Anaphylaxis   Iodine Anaphylaxis   Pt states she is allergic to INTERNAL IODINE   Betadine [povidone Iodine] Hives   Methocarbamol Nausea Only   Felt nauseated and sick   Sulfa Antibiotics    N/V        Medication List     STOP taking these medications    linaclotide 145 MCG Caps capsule Commonly known as: Linzess       TAKE these medications    albuterol 108 (90 Base) MCG/ACT inhaler Commonly known as: VENTOLIN HFA Inhale 2 puffs into the lungs every 6 (six) hours as needed for wheezing or shortness of breath.   aspirin 81 MG chewable tablet Chew 1 tablet (81 mg total) by mouth 2 (two) times daily.   carboxymethylcellulose 0.5 % Soln Commonly known as: REFRESH PLUS Place 1 drop into both eyes 3 (three) times daily as needed (dry eyes).   ciprofloxacin 500 MG tablet Commonly known as: Cipro Take 1 tablet (500 mg total) by mouth 2 (two) times daily for 14 days.   EC-Naproxen 500 MG EC tablet Generic drug: naproxen Take 500 mg by  mouth 2 (two) times daily.   escitalopram 20 MG tablet Commonly known as: LEXAPRO Take 20 mg by mouth daily.   fluconazole 200 MG tablet Commonly known as: DIFLUCAN Take 4 tablets (800 mg total) by mouth daily for 14 days.    gabapentin 400 MG capsule Commonly known as: NEURONTIN Take 800 mg by mouth 3 (three) times daily.   HYDROcodone-acetaminophen 5-325 MG tablet Commonly known as: NORCO/VICODIN Take 1-2 tablets by mouth every 6 (six) hours as needed for moderate pain.   LORazepam 0.5 MG tablet Commonly known as: ATIVAN Take 0.5-1 mg by mouth See admin instructions. 0.5mg  in AM and 1mg  in evening   metoprolol tartrate 25 MG tablet Commonly known as: LOPRESSOR Take 12.5 mg by mouth daily.   metroNIDAZOLE 500 MG tablet Commonly known as: Flagyl Take 1 tablet (500 mg total) by mouth 2 (two) times daily for 14 days.   nystatin 100000 UNIT/ML suspension Commonly known as: MYCOSTATIN Take 5 mLs by mouth 4 (four) times daily as needed (dry mouth).   ondansetron 4 MG disintegrating tablet Commonly known as: Zofran ODT Take 1 tablet (4 mg total) by mouth every 8 (eight) hours as needed for nausea or vomiting.   oxyCODONE 5 MG immediate release tablet Commonly known as: Oxy IR/ROXICODONE Take 1 tablet (5 mg total) by mouth every 6 (six) hours as needed for moderate pain, severe pain or breakthrough pain.   predniSONE 10 MG tablet Commonly known as: DELTASONE Take 10 mg by mouth daily with breakfast.   tiZANidine 4 MG tablet Commonly known as: ZANAFLEX Take 1 tablet (4 mg total) by mouth every 8 (eight) hours as needed for muscle spasms.          Follow-up Information     Berkley Harvey, NP Follow up.   Specialty: Nurse Practitioner Why: call to schedule follow up from recent hospitalization and discussion of sleep study Contact information: [place] [address] [phone]         Arta Silence, MD Follow up.   Specialty: Gastroenterology Why: call to schedule follow up of diverticulities in about 6 weeks Contact information: [address] [place] [address] [phone]         Dwan Bolt, MD. Go on 07/12/2021.   Specialty:  General Surgery Why: follow up on 10/25 at 9:45 AM. please arrive 30 minutes prior to your appointment to complete check in process and bring photo ID and insurance card Contact information: [place]. [address] [place] [address] [phone]         [facility] [place] Follow up.   Specialty: [place] Why: to provide home health nursing and physical therapy visits Contact information: [place] [address] [phone]         Jabier Mutton, MD Follow up on 06/29/2021.   Specialty: Infectious Diseases Why: follow up in clinic on 10/12 @ 3:45pm Contact information: [address] [place] [address] [phone]                 Signed: Caroll Rancher [facility] Surgery 06/21/2021, 2:13 PM Please see Amion for pager number during day hours 7:00am-4:30pm

## 2021-06-21 NOTE — Progress Notes (Signed)
Progress Note  2 Days Post-Op  Subjective: Tolerated regular diet. Having good bowel function. Pain well controlled. Ambulating. No other complaints  Objective: Vital signs in last 24 hours: Temp:  [97.8 F (36.6 C)-98.4 F (36.9 C)] 97.8 F (36.6 C) (10/04 0428) Pulse Rate:  [66-71] 66 (10/04 0428) Resp:  [15-18] 16 (10/04 0428) BP: (140-156)/(69-85) 156/77 (10/04 0428) SpO2:  [94 %-95 %] 95 % (10/04 0428) Last BM Date: 06/20/21  Intake/Output from previous day: 10/03 0701 - 10/04 0700 In: 1698 [P.O.:1130; I.V.:316.5; IV Piggyback:251.5] Out: 850 [Urine:800; Stool:50] Intake/Output this shift: No intake/output data recorded.  PE: General: pleasant, WD, female who is laying in bed in NAD HEENT: head is normocephalic, atraumatic.  Mouth is pink and moist Heart: regular, rate, and rhythm. Palpable radial and pedal pulses bilaterally Lungs: CTAB.  Respiratory effort nonlabored Abd: soft, ND, +BS, Midline wound with retention sutures intact - no surrounding erythema, no purulent discharge, appropriate bleeding. Ostomy with soft brown stool MSK: all 4 extremities are symmetrical with no cyanosis, clubbing, or edema. Skin: warm and dry with no masses, lesions, or rashes Psych: A&Ox3 with an appropriate affect.       Lab Results:  Recent Labs    06/19/21 1044  WBC 17.7*  HGB 11.6*  HCT 38.1  PLT 505*    BMET Recent Labs    06/19/21 1044  NA 136  K 4.9  CL 96*  CO2 32  GLUCOSE 129*  BUN 12  CREATININE 0.76  CALCIUM 8.4*    PT/INR No results for input(s): LABPROT, INR in the last 72 hours. CMP     Component Value Date/Time   NA 136 06/19/2021 1044   K 4.9 06/19/2021 1044   CL 96 (L) 06/19/2021 1044   CO2 32 06/19/2021 1044   GLUCOSE 129 (H) 06/19/2021 1044   BUN 12 06/19/2021 1044   CREATININE 0.76 06/19/2021 1044   CALCIUM 8.4 (L) 06/19/2021 1044   PROT 6.6 06/19/2021 1044   ALBUMIN 2.9 (L) 06/19/2021 1044   AST 24 06/19/2021 1044   ALT 11  06/19/2021 1044   ALKPHOS 71 06/19/2021 1044   BILITOT 0.7 06/19/2021 1044   GFRNONAA >60 06/19/2021 1044   GFRAA >60 10/02/2019 0445   Lipase     Component Value Date/Time   LIPASE 23 06/19/2021 1044       Studies/Results: DG Chest Portable 1 View  Result Date: 06/19/2021 CLINICAL DATA:  Abdominal pain.  Hypoxia. EXAM: PORTABLE CHEST 1 VIEW COMPARISON:  Chest radiograph June 14, 2021. FINDINGS: Stable cardiac and mediastinal contours. Interval increase in perihilar interstitial opacities. No large area pulmonary consolidation. No pleural effusion or pneumothorax. IMPRESSION: Findings suggestive of mild interstitial edema. Electronically Signed   By: Lovey Newcomer M.D.   On: 06/19/2021 10:40    Anti-infectives: Anti-infectives (From admission, onward)    Start     Dose/Rate Route Frequency Ordered Stop   06/21/21 1600  anidulafungin (ERAXIS) 100 mg in sodium chloride 0.9 % 100 mL IVPB       See Hyperspace for full Linked Orders Report.   100 mg 78 mL/hr over 100 Minutes Intravenous Every 24 hours 06/20/21 1011 07/02/21 1559   06/20/21 1100  cefTRIAXone (ROCEPHIN) 2 g in sodium chloride 0.9 % 100 mL IVPB        2 g 200 mL/hr over 30 Minutes Intravenous Every 24 hours 06/20/21 1011 07/01/21 1359   06/20/21 1100  metroNIDAZOLE (FLAGYL) IVPB 500 mg  500 mg 100 mL/hr over 60 Minutes Intravenous Every 12 hours 06/20/21 1011 07/02/21 0559   06/20/21 1100  anidulafungin (ERAXIS) 200 mg in sodium chloride 0.9 % 200 mL IVPB       See Hyperspace for full Linked Orders Report.   200 mg 78 mL/hr over 200 Minutes Intravenous Every 24 hours 06/20/21 1011 06/20/21 1852   06/20/21 0600  ceFAZolin (ANCEF) IVPB 2g/100 mL premix  Status:  Discontinued        2 g 200 mL/hr over 30 Minutes Intravenous On call to O.R. 06/19/21 1516 06/20/21 0455   06/19/21 1045  piperacillin-tazobactam (ZOSYN) IVPB 3.375 g        3.375 g 100 mL/hr over 30 Minutes Intravenous  Once 06/19/21 1036 06/19/21  1213        Assessment/Plan  POD 11 s/p Exploratory laparotomy with washout of intra-abdominal abscess, sigmoid colectomy, end colostomy, placement of intra-abdominal drain for Perforated sigmoid diverticulitis with pelvic abscess by Dr. Zenia Resides on 9/23 POD 2 s/p ex lap with closure of fascia using retention sutures  for complete abdominal wall dehiscence by Dr. Brantley Stage 10/2 - Cont abx. Initial cx's w/ Kleb PNA and candida glabrata, resistant to zosyn. Sensitive to ceftriaxone, flagyl. continue eraxis and IV abx while inpatient - ID involved during previous admission with plans at discharge for 2 weeks of ciprofloxacin 500 mg po bid, metronidazole 500 mg po bid, and fluconazole 800 mg po daily until 10/12 - BID WTD midline wound, abdominal binder - Mobilize, PT/OT - Pulm toilet - tolerated regular diet  FEN - regular VTE - SCDs, Lovenox ID - Zosyn 9/16 >9/27, ceftriaxone/flagyl 9/27>>, eraxis 9/29, 10/2>   Hx Lupus on steroids Possible OSA - sleep study outpatient Restless legs     Discharge today on abx - she has these at home already. Follow up had been previously arraigned. Abdominal binder to be provided prior to dc   LOS: 2 days    Winferd Humphrey, [facility] 06/21/2021, 8:50 AM Please see Amion for pager number during day hours 7:00am-4:30pm

## 2021-06-21 NOTE — Discharge Summary (Signed)
[facility] Discharge Summary   Patient ID: Brittany Hobbs MRN: [id number] DOB/AGE: 24-Jun-1961 60 y.o.  Admit date: 06/19/2021 Discharge date: 06/21/2021  Admitting Diagnosis: History of exploratory laparotomy with washout of intra-abdominal abscess, sigmoid colectomy, end colostomy, placement of intra-abdominal drain Dehiscence of postoperative wound of abdomen  Discharge Diagnosis Dehiscence of postoperative wound of abdomen s/p exploratory laparotomy with closure of fascia using retention sutures  s/p exploratory laparotomy with washout of intra-abdominal abscess, sigmoid colectomy, end colostomy, placement of intra-abdominal drain Lupus Possible OSA  Consultants Infectious Disease - Dr. Gale Journey  Imaging: No results found.  Procedures Dr. Brantley Stage (06/19/21) -  Exploratory laparotomy with closure of fascia using retention sutures   Hospital Course:  60 year old female who presented to [facility] ED with fascial wound dehiscence after recent admission from 9/16-10/1. She was discharged day prior when she was 9 days status post sigmoid colectomy and colostomy by Dr. Zenia Resides for diverticulitis and abscess after which her wound was left open to heal by secondary intention with twice daily wet to dry dressing changes.  Patient was admitted and immediately underwent procedure listed above.  Tolerated procedure well and was transferred to the floor.  Diet was advanced as tolerated.  On POD2, the patient was voiding well, tolerating diet, ambulating well, pain well controlled, vital signs stable, incision with retention sutures intact and wound appropriately packed and felt stable for discharge home.  Patient will follow up in our office as previously scheduled and knows to call with questions or concerns. She will discharge with abdominal binder and strict lifting restrictions. Infectious disease saw her during admission and recommended continuation of prior recommendations:  levofloxacin, metronidazole, and high dose fluconazole and follow up with them as planned. She has these prescriptions as well as pain medication at home.  All questions answered and she verbalized understanding of discharge instructions.   Allergies as of 06/21/2021       Reactions   Contrast Media [iodinated Diagnostic Agents] Anaphylaxis   Iodine Anaphylaxis   Pt states she is allergic to INTERNAL IODINE   Betadine [povidone Iodine] Hives   Methocarbamol Nausea Only   Felt nauseated and sick   Sulfa Antibiotics    N/V        Medication List     TAKE these medications    albuterol 108 (90 Base) MCG/ACT inhaler Commonly known as: VENTOLIN HFA Inhale 2 puffs into the lungs every 6 (six) hours as needed for wheezing or shortness of breath.   aspirin 81 MG chewable tablet Chew 1 tablet (81 mg total) by mouth 2 (two) times daily.   carboxymethylcellulose 0.5 % Soln Commonly known as: REFRESH PLUS Place 1 drop into both eyes 3 (three) times daily as needed (dry eyes).   ciprofloxacin 500 MG tablet Commonly known as: Cipro Take 1 tablet (500 mg total) by mouth 2 (two) times daily for 14 days.   EC-Naproxen 500 MG EC tablet Generic drug: naproxen Take 500 mg by mouth 2 (two) times daily.   escitalopram 20 MG tablet Commonly known as: LEXAPRO Take 20 mg by mouth daily.   fluconazole 200 MG tablet Commonly known as: DIFLUCAN Take 4 tablets (800 mg total) by mouth daily for 14 days.   gabapentin 400 MG capsule Commonly known as: NEURONTIN Take 800 mg by mouth 3 (three) times daily.   HYDROcodone-acetaminophen 5-325 MG tablet Commonly known as: NORCO/VICODIN Take 1-2 tablets by mouth every 6 (six) hours as needed for moderate pain.  LORazepam 0.5 MG tablet Commonly known as: ATIVAN Take 0.5-1 mg by mouth See admin instructions. 0.5mg  in AM and 1mg  in evening   metoprolol tartrate 25 MG tablet Commonly known as: LOPRESSOR Take 12.5 mg by mouth daily.    metroNIDAZOLE 500 MG tablet Commonly known as: Flagyl Take 1 tablet (500 mg total) by mouth 2 (two) times daily for 14 days.   nystatin 100000 UNIT/ML suspension Commonly known as: MYCOSTATIN Take 5 mLs by mouth 4 (four) times daily as needed (dry mouth).   ondansetron 4 MG disintegrating tablet Commonly known as: Zofran ODT Take 1 tablet (4 mg total) by mouth every 8 (eight) hours as needed for nausea or vomiting.   oxyCODONE 5 MG immediate release tablet Commonly known as: Oxy IR/ROXICODONE Take 1 tablet (5 mg total) by mouth every 6 (six) hours as needed for moderate pain, severe pain or breakthrough pain.   predniSONE 10 MG tablet Commonly known as: DELTASONE Take 10 mg by mouth daily with breakfast.   tiZANidine 4 MG tablet Commonly known as: ZANAFLEX Take 1 tablet (4 mg total) by mouth every 8 (eight) hours as needed for muscle spasms.          Signed: Winferd Humphrey , [facility] Surgery 06/21/2021, 11:49 AM Please see Amion for pager number during day hours 7:00am-4:30pm

## 2021-06-23 ENCOUNTER — Emergency Department (HOSPITAL_COMMUNITY): Payer: BC Managed Care – PPO

## 2021-06-23 ENCOUNTER — Encounter (HOSPITAL_COMMUNITY): Payer: Self-pay | Admitting: *Deleted

## 2021-06-23 ENCOUNTER — Other Ambulatory Visit: Payer: Self-pay

## 2021-06-23 ENCOUNTER — Emergency Department (HOSPITAL_COMMUNITY)
Admission: EM | Admit: 2021-06-23 | Discharge: 2021-06-23 | Disposition: A | Discharge status: Home / Self Care | Payer: BC Managed Care – PPO | Attending: Emergency Medicine | Admitting: Emergency Medicine

## 2021-06-23 DIAGNOSIS — Z96651 Presence of right artificial knee joint: Secondary | ICD-10-CM | POA: Diagnosis not present

## 2021-06-23 DIAGNOSIS — Z79899 Other long term (current) drug therapy: Secondary | ICD-10-CM | POA: Diagnosis not present

## 2021-06-23 DIAGNOSIS — F17210 Nicotine dependence, cigarettes, uncomplicated: Secondary | ICD-10-CM | POA: Insufficient documentation

## 2021-06-23 DIAGNOSIS — G8918 Other acute postprocedural pain: Secondary | ICD-10-CM

## 2021-06-23 DIAGNOSIS — Z20822 Contact with and (suspected) exposure to covid-19: Secondary | ICD-10-CM | POA: Insufficient documentation

## 2021-06-23 DIAGNOSIS — R0602 Shortness of breath: Secondary | ICD-10-CM | POA: Diagnosis not present

## 2021-06-23 DIAGNOSIS — R531 Weakness: Secondary | ICD-10-CM

## 2021-06-23 DIAGNOSIS — I10 Essential (primary) hypertension: Secondary | ICD-10-CM | POA: Diagnosis not present

## 2021-06-23 DIAGNOSIS — R109 Unspecified abdominal pain: Secondary | ICD-10-CM | POA: Diagnosis present

## 2021-06-23 DIAGNOSIS — R5381 Other malaise: Secondary | ICD-10-CM | POA: Insufficient documentation

## 2021-06-23 DIAGNOSIS — D72829 Elevated white blood cell count, unspecified: Secondary | ICD-10-CM | POA: Diagnosis not present

## 2021-06-23 DIAGNOSIS — Z7982 Long term (current) use of aspirin: Secondary | ICD-10-CM | POA: Diagnosis not present

## 2021-06-23 DIAGNOSIS — J45909 Unspecified asthma, uncomplicated: Secondary | ICD-10-CM | POA: Diagnosis not present

## 2021-06-23 LAB — CBC WITH DIFFERENTIAL/PLATELET
Abs Immature Granulocytes: 0.07 10*3/uL (ref 0.00–0.07)
Basophils Absolute: 0 10*3/uL (ref 0.0–0.1)
Basophils Relative: 0 %
Eosinophils Absolute: 0 10*3/uL (ref 0.0–0.5)
Eosinophils Relative: 0 %
HCT: 38.5 % (ref 36.0–46.0)
Hemoglobin: 12.5 g/dL (ref 12.0–15.0)
Immature Granulocytes: 1 %
Lymphocytes Relative: 7 %
Lymphs Abs: 1.1 10*3/uL (ref 0.7–4.0)
MCH: 27.6 pg (ref 26.0–34.0)
MCHC: 32.5 g/dL (ref 30.0–36.0)
MCV: 85 fL (ref 80.0–100.0)
Monocytes Absolute: 0.7 10*3/uL (ref 0.1–1.0)
Monocytes Relative: 5 %
Neutro Abs: 13.2 10*3/uL — ABNORMAL HIGH (ref 1.7–7.7)
Neutrophils Relative %: 87 %
Platelets: 567 10*3/uL — ABNORMAL HIGH (ref 150–400)
RBC: 4.53 MIL/uL (ref 3.87–5.11)
RDW: 16.9 % — ABNORMAL HIGH (ref 11.5–15.5)
WBC: 15 10*3/uL — ABNORMAL HIGH (ref 4.0–10.5)
nRBC: 0 % (ref 0.0–0.2)

## 2021-06-23 LAB — COMPREHENSIVE METABOLIC PANEL
ALT: 12 U/L (ref 0–44)
AST: 17 U/L (ref 15–41)
Albumin: 3.2 g/dL — ABNORMAL LOW (ref 3.5–5.0)
Alkaline Phosphatase: 70 U/L (ref 38–126)
Anion gap: 9 (ref 5–15)
BUN: 18 mg/dL (ref 6–20)
CO2: 23 mmol/L (ref 22–32)
Calcium: 9.2 mg/dL (ref 8.9–10.3)
Chloride: 107 mmol/L (ref 98–111)
Creatinine, Ser: 0.78 mg/dL (ref 0.44–1.00)
GFR, Estimated: >60 mL/min (ref >60)
Glucose, Bld: 128 mg/dL — ABNORMAL HIGH (ref 70–99)
Potassium: 3.4 mmol/L — ABNORMAL LOW (ref 3.5–5.1)
Sodium: 139 mmol/L (ref 135–145)
Total Bilirubin: 0.8 mg/dL (ref 0.3–1.2)
Total Protein: 6.9 g/dL (ref 6.5–8.1)

## 2021-06-23 LAB — ANTIFUNGAL AST 9 DRUG PANEL
Amphotericin B MIC: 1
Fluconazole Islt MIC: 1
Flucytosine MIC: 0.12
Itraconazole MIC: 0.25
Posaconazole MIC: 0.06
Voriconazole MIC: 0.06

## 2021-06-23 LAB — RESP PANEL BY RT-PCR (FLU A&B, COVID) ARPGX2
Influenza A by PCR: NEGATIVE
Influenza B by PCR: NEGATIVE
SARS Coronavirus 2 by RT PCR: NEGATIVE

## 2021-06-23 LAB — LACTIC ACID, PLASMA: Lactic Acid, Venous: 2.1 mmol/L (ref 0.5–1.9)

## 2021-06-23 LAB — LIPASE, BLOOD: Lipase: 37 U/L (ref 11–51)

## 2021-06-23 IMAGING — CT CT ABD-PELV W/O CM
2 of 4 series · 15 of 46 positions shown, 17 images · non-contrast
Comparison: [DATE].

CLINICAL DATA: Abdominal pain and weakness. Status post exploratory
laparotomy. Abdominal abscess/infection suspected.

EXAM:
CT ABDOMEN AND PELVIS WITHOUT CONTRAST
TECHNIQUE: Multidetector CT imaging of the abdomen and pelvis was performed
following the standard protocol without IV contrast.

[Series 2: axial st · axial · 0.76mm/px · z∈[-373,-23]mm · 12 of 78 slices shown, 14 images]
[im 4/78  soft-tissue]
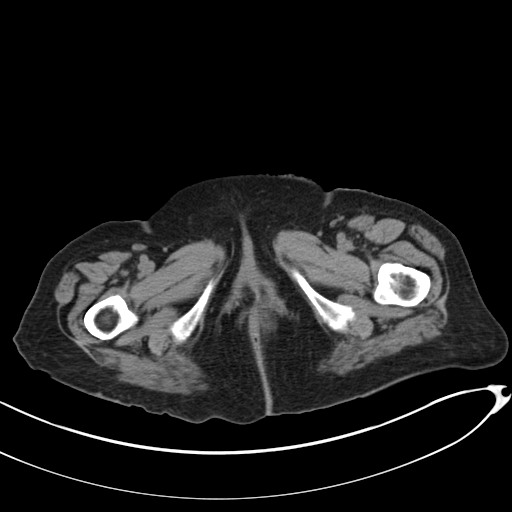
[im 4/78  bone]
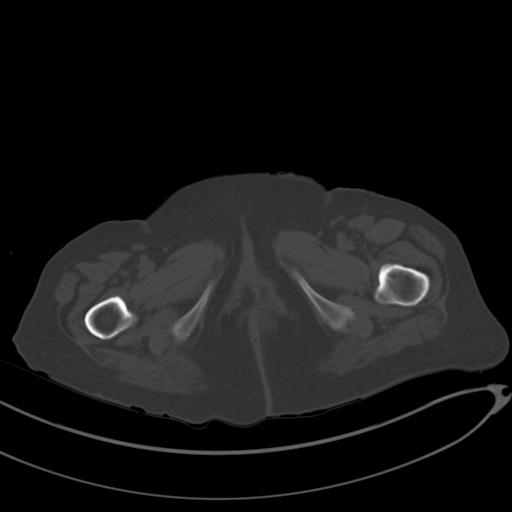
[im 12/78  soft-tissue]
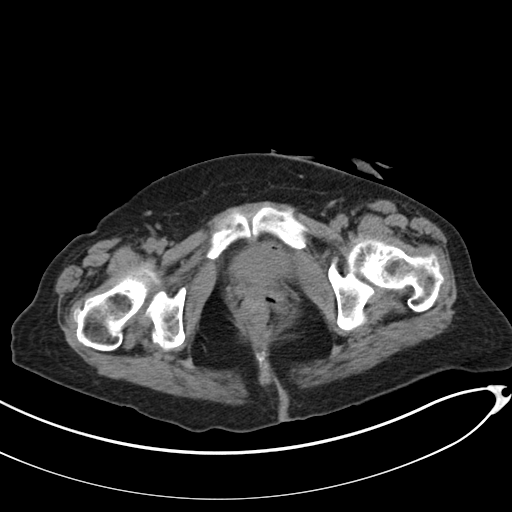
[im 19/78  soft-tissue]
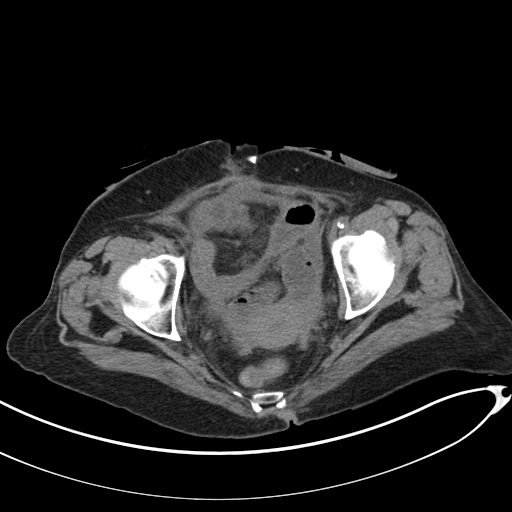
[im 23/78  soft-tissue]
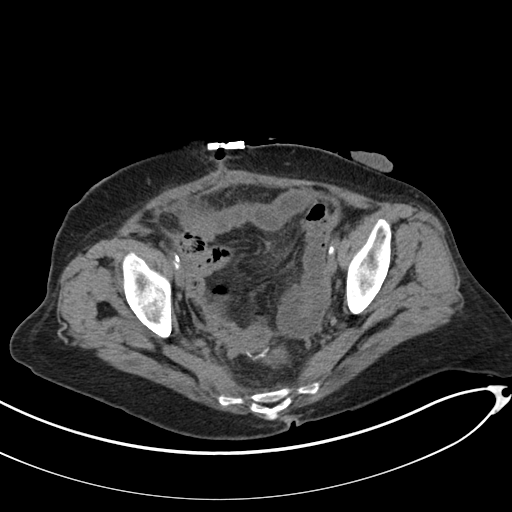
[im 30/78  soft-tissue]
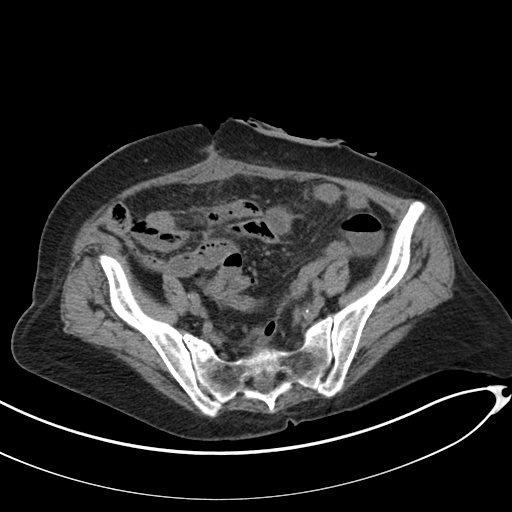
[im 37/78  soft-tissue]
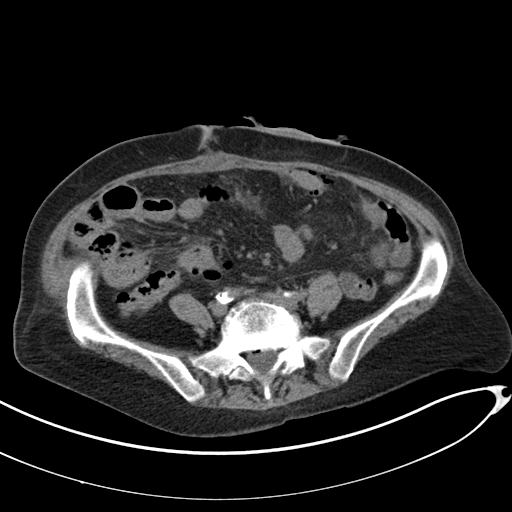
[im 41/78  soft-tissue]
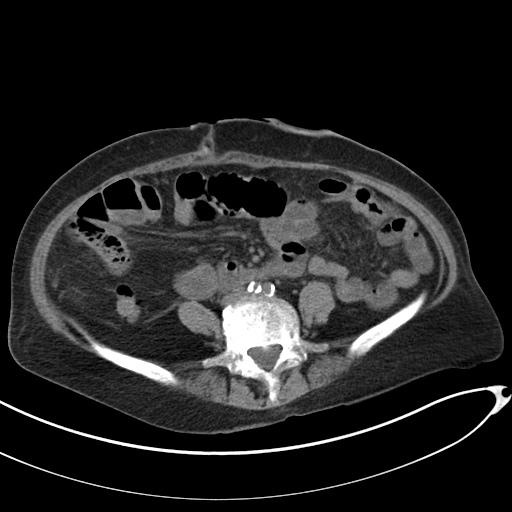
[im 48/78  soft-tissue]
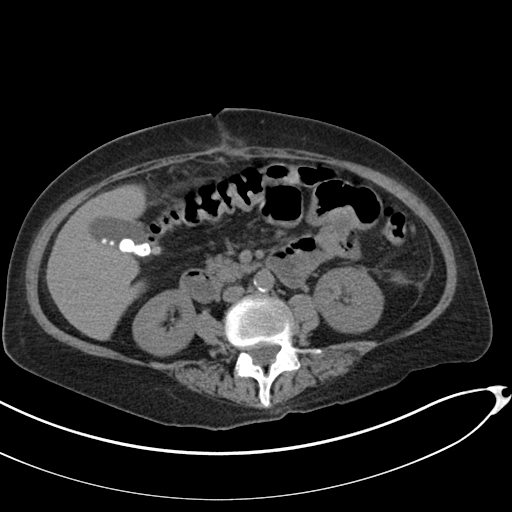
[im 56/78  soft-tissue]
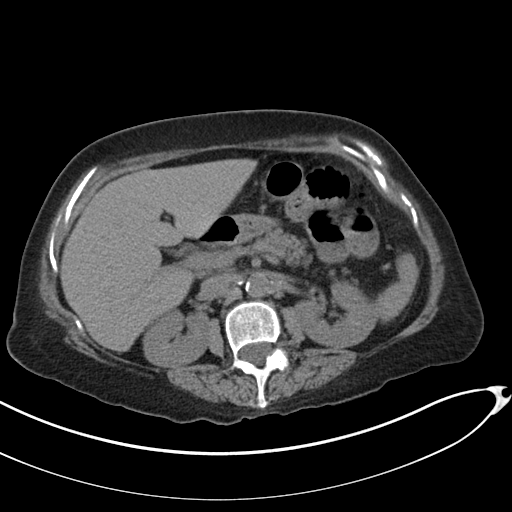
[im 56/78  bone]
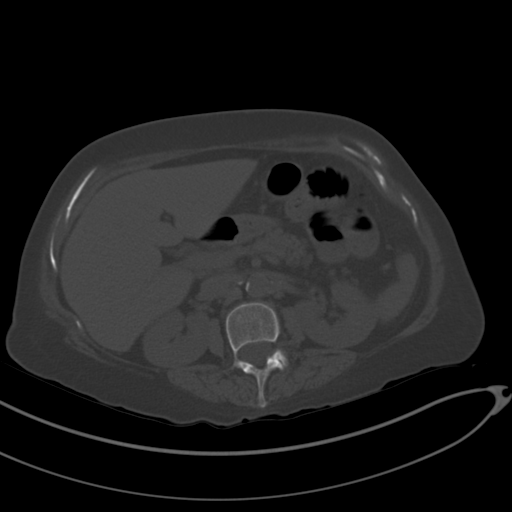
[im 59/78  soft-tissue]
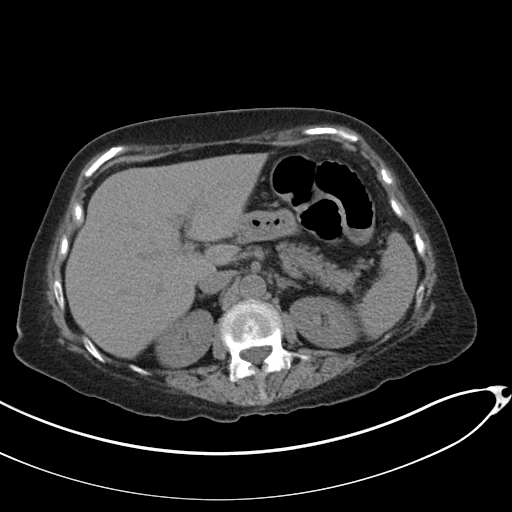
[im 67/78  soft-tissue]
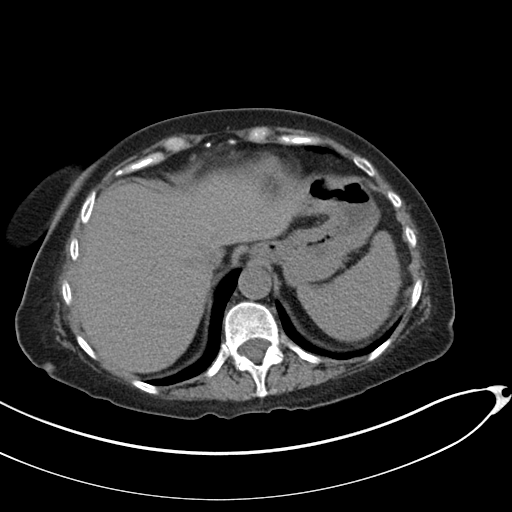
[im 74/78  soft-tissue]
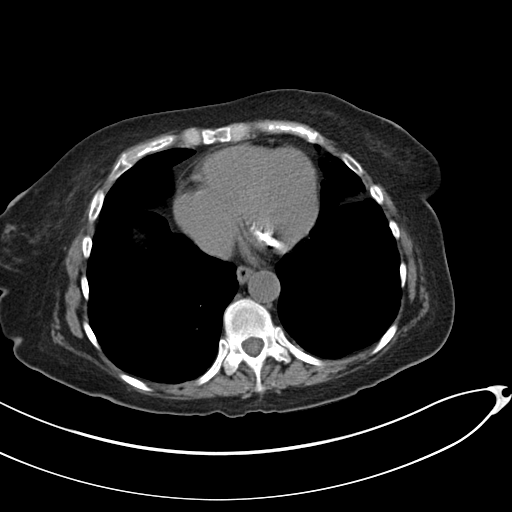

[Series 4: coronal st · coronal · 0.75mm/px · 3 of 136 slices shown]
[im 46/136  soft-tissue]
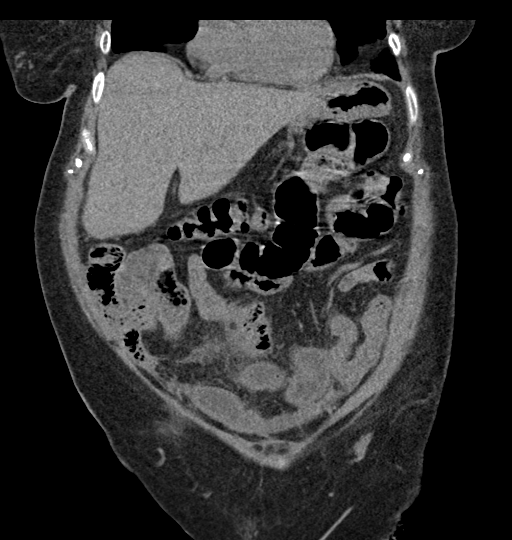
[im 61/136  soft-tissue]
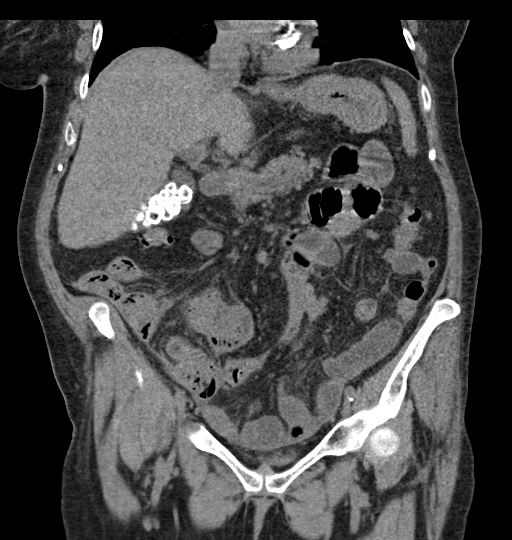
[im 76/136  soft-tissue]
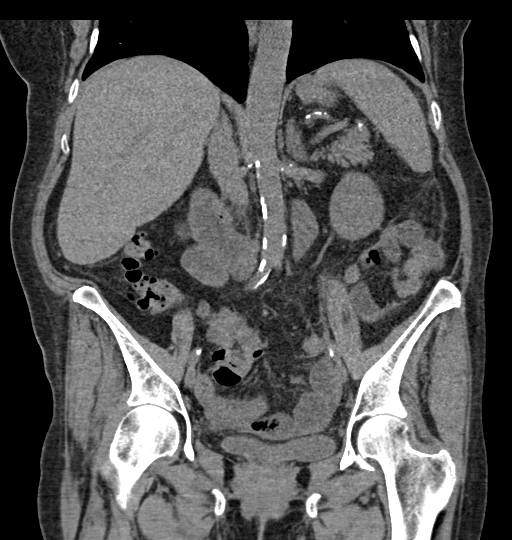

[15 of 46 positions shown; findings below may reference images not displayed]

FINDINGS: Lower chest: No acute abnormality.

Hepatobiliary: No focal liver abnormality. Multiple gallstones
measure up to 1.3 cm. No signs of gallbladder inflammation or bile
duct dilatation.

Pancreas: Unremarkable. No pancreatic ductal dilatation or
surrounding inflammatory changes.

Spleen: Normal in size without focal abnormality.

Adrenals/Urinary Tract: Normal adrenal glands. No nephrolithiasis,
hydronephrosis or mass identified. The urinary bladder appears
decompressed.

.

Stomach/Bowel: Status post left lower quadrant colostomy. The
stomach appears within normal limits. Mild increase caliber of left
upper quadrant small bowel loops measure up to 3.4 cm, image 34/2.
Mild wall thickening with surrounding hazy inflammatory fat
stranding is noted involving the lower abdominal and pelvic small
bowel loops. No pathologic dilatation of the large or small bowel
loops identified

Vascular/Lymphatic: Aortic atherosclerosis without aneurysm. No
signs of abdominopelvic adenopathy. There is soft tissue stranding
identified within the small bowel mesentery of the lower abdomen and
pelvis compatible with edema and inflammation.

Reproductive: Uterus and bilateral adnexa are unremarkable.

Other: No significant free fluid. Along the undersurface of the
lower ventral abdominal wall there is an thin, fluid collection
along the tract of the previous surgical drainage catheter. This
measures 3.1 by 1.2 by 2.5 cm and is difficult to adequately assess
reflecting lack of IV and oral contrast material. No large drainable
fluid collections noted. Several foci of gas are noted within the
ventral abdominal wall facia, image 82/5. Open midline ventral
abdominal wall incision site is identified. No fluid collections
identified

Musculoskeletal: No acute or significant osseous findings. Mild
superior endplate compression deformities are again identified
involving the T12, L2, L3, and L4 vertebral bodies. Degenerative
disc disease noted at L4-5 and L5-S1.
IMPRESSION: 1. Status post left lower quadrant colostomy.
2. There is wall thickening with surrounding inflammatory fat
stranding involving the lower abdominal and pelvic small bowel
loops. Imaging findings compatible with enteritis.
3. Increased caliber of left upper quadrant proximal small bowel
loops which is favored to represent ileus in the setting of
enteritis and suspected peritonitis.
4. There is an equivocal, small fluid collection along the
undersurface of the lower ventral abdominal wall tract of the
previous surgical drainage catheter. This is difficult to assess
reflecting lack of IV and oral contrast material. No large drainable
fluid collections identified.
5. Soft tissue stranding within the small bowel mesentery of the
lower abdomen and pelvis compatible with edema/inflammation.
6. Gallstones.

## 2021-06-23 IMAGING — DX DG CHEST 1V PORT
1 series · 1 of 1 positions shown · non-contrast
Comparison: [DATE]

CLINICAL DATA: Shortness of breath

EXAM:
PORTABLE CHEST 1 VIEW

[chest ap]
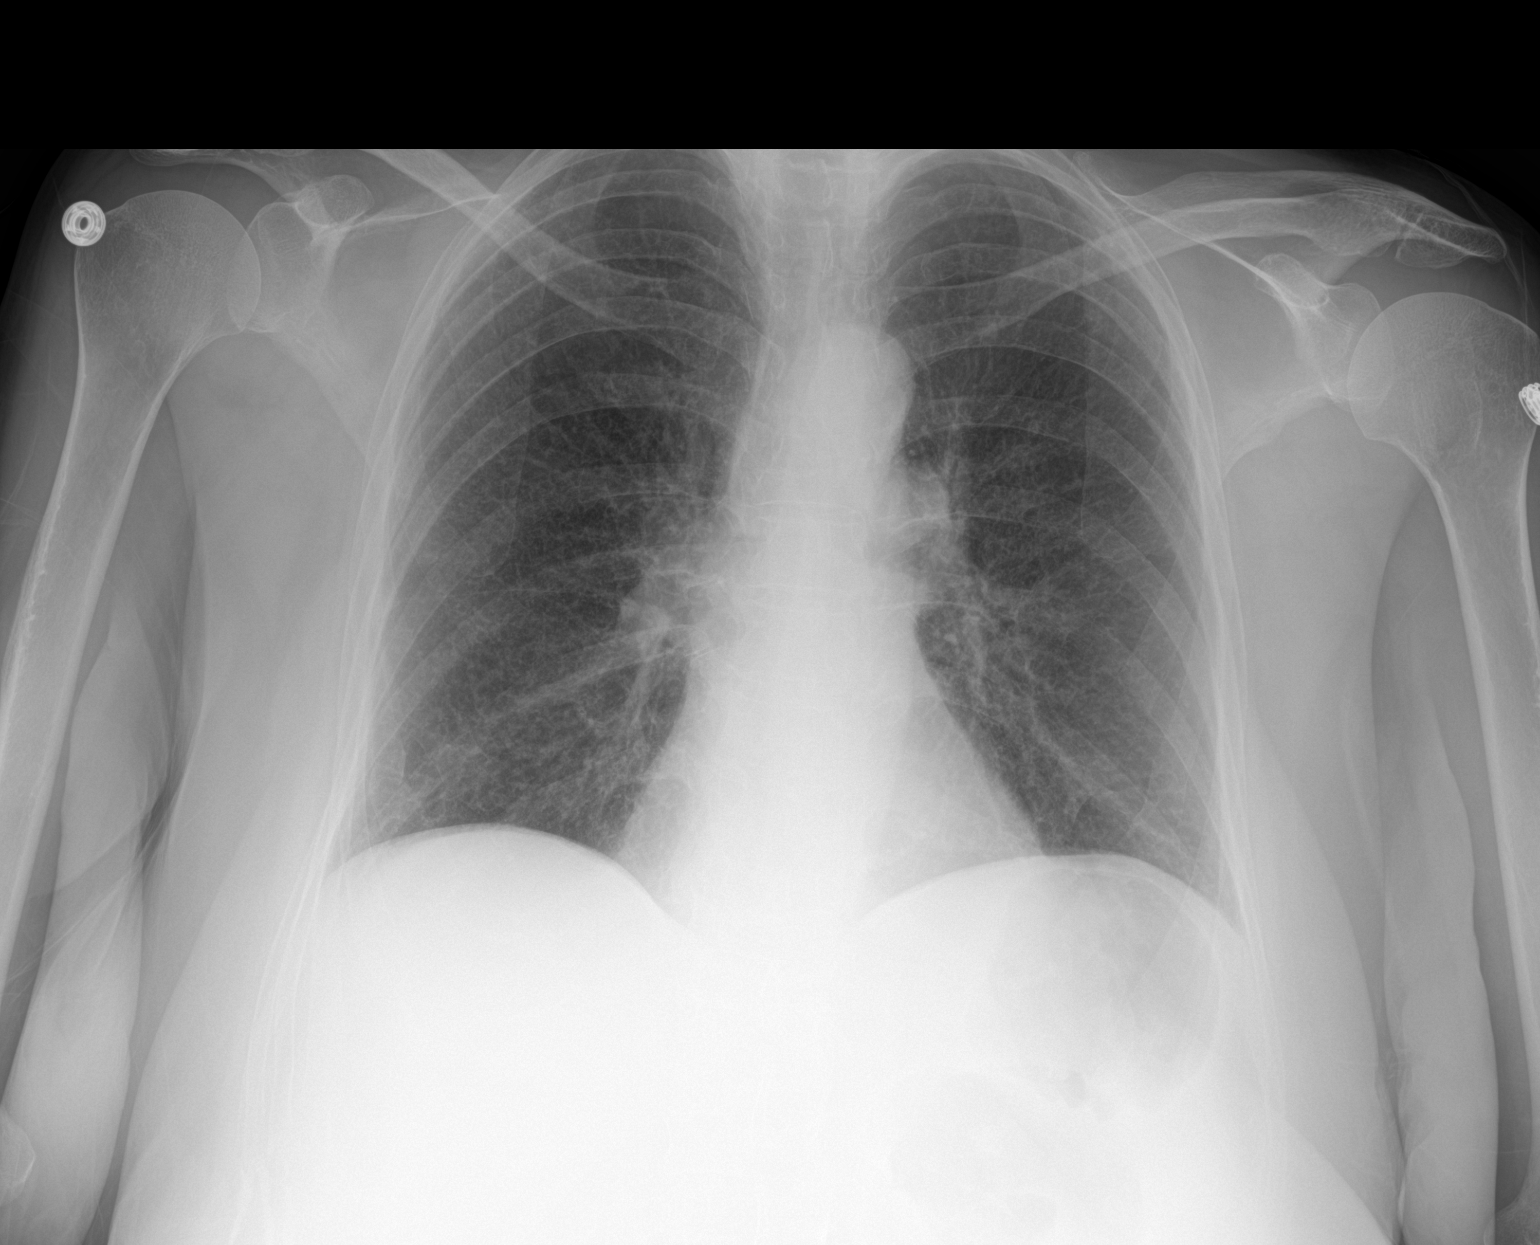

[1 of 1 positions shown; findings below may reference images not displayed]

FINDINGS: The heart size and mediastinal contours are within normal limits. No
focal airspace consolidation, pleural effusion, or pneumothorax. The
visualized skeletal structures are unremarkable.
IMPRESSION: No active disease.

## 2021-06-23 MED ORDER — GABAPENTIN 400 MG PO CAPS
800.0000 mg | ORAL_CAPSULE | Freq: Once | ORAL | Status: AC
  Administered 2021-06-23: 800 mg via ORAL
  Filled 2021-06-23: qty 8

## 2021-06-23 MED ORDER — HYDROCODONE-ACETAMINOPHEN 5-325 MG PO TABS
2.0000 | ORAL_TABLET | Freq: Once | ORAL | Status: AC
Start: 2021-06-23 — End: 2021-06-23
  Administered 2021-06-23: 2 via ORAL
  Filled 2021-06-23: qty 2

## 2021-06-23 MED ORDER — LACTATED RINGERS IV SOLN: INTRAVENOUS | Status: DC

## 2021-06-23 MED ORDER — METRONIDAZOLE 500 MG PO TABS
500.0000 mg | ORAL_TABLET | Freq: Once | ORAL | Status: AC
  Administered 2021-06-23: 500 mg via ORAL
  Filled 2021-06-23: qty 1

## 2021-06-23 MED ORDER — LACTATED RINGERS IV BOLUS
1000.0000 mL | Freq: Once | INTRAVENOUS | Status: AC
  Administered 2021-06-23: 1000 mL via INTRAVENOUS

## 2021-06-23 MED ORDER — MORPHINE SULFATE (PF) 4 MG/ML IV SOLN
6.0000 mg | Freq: Once | INTRAVENOUS | Status: AC
Start: 2021-06-23 — End: 2021-06-23
  Administered 2021-06-23: 6 mg via INTRAVENOUS
  Filled 2021-06-23: qty 2

## 2021-06-23 MED ORDER — ONDANSETRON HCL 4 MG/2ML IJ SOLN
4.0000 mg | Freq: Once | INTRAMUSCULAR | Status: AC
  Administered 2021-06-23: 4 mg via INTRAVENOUS
  Filled 2021-06-23: qty 2

## 2021-06-23 MED ORDER — CIPROFLOXACIN HCL 500 MG PO TABS
500.0000 mg | ORAL_TABLET | Freq: Once | ORAL | Status: AC
  Administered 2021-06-23: 500 mg via ORAL
  Filled 2021-06-23: qty 1

## 2021-06-23 MED ORDER — PIPERACILLIN-TAZOBACTAM 3.375 G IVPB 30 MIN
3.3750 g | Freq: Once | INTRAVENOUS | Status: AC
  Administered 2021-06-23: 3.375 g via INTRAVENOUS
  Filled 2021-06-23: qty 50

## 2021-06-23 MED ORDER — MORPHINE SULFATE (PF) 4 MG/ML IV SOLN
6.0000 mg | Freq: Once | INTRAVENOUS | Status: AC
  Administered 2021-06-23: 6 mg via INTRAVENOUS
  Filled 2021-06-23: qty 2

## 2021-06-23 NOTE — ED Provider Notes (Signed)
Emergency Medicine Provider Triage Evaluation Note  Brittany Hobbs , a 60 y.o. female  was evaluated in triage.  Pt complains of abdominal pain, weakness.  She was recently admitted for  exploratory laparotomy with washout of intra-abdominal abscess, sigmoid colectomy, end colostomy, placement of intra-abdominal drain and dehiscence of postoperative wound of abdomen.  She states that her incision has been worsening and opening further.  Also reports that her colostomy has broken and is leaking.  Physical Exam  BP (!) 149/77 (BP Location: Left Arm)   Pulse 77   Temp 98.4 F (36.9 C) (Oral)   Resp 20   SpO2 100%  Gen:   Awake, no distress   Resp:  Normal effort  MSK:   Moves extremities without difficulty  Other:    Medical Decision Making  Medically screening exam initiated at 12:16 PM.  Appropriate orders placed.  Brittany Hobbs was informed that the remainder of the evaluation will be completed by another provider, this initial triage assessment does not replace that evaluation, and the importance of remaining in the ED until their evaluation is complete.   Brittany Sexton, PA-C 06/23/21 1218    Brittany Leigh, MD 06/27/21 1555
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
## 2021-06-23 NOTE — Discharge Instructions (Addendum)
1.  Continue all of your medications as prescribed from your hospital discharge 2 days ago.  You were given your evening dose of ciprofloxacin and Flagyl and gabapentin in the emergency department.  You were also given an IV dose of antibiotics.  Blood cultures were obtained.  Results will be back in 24 and 48 hours. 2.  Is very important that you continue to follow-up with your surgeon and your infectious disease specialist as well as your family doctor.  You will need close monitoring until you are doing better.  You were seen by surgery today and had a CT scan done.  At this time they did not feel that additional intervention or hospitalization is indicated in relation to your surgery.  However, if you are having new worsening or changing symptoms this should be reevaluated quickly.  If you cannot be seen by your outpatient physicians, return to the emergency department for recheck.

## 2021-06-23 NOTE — ED Provider Notes (Signed)
Patient was seen by general surgery.  CT scans reviewed.  At this time they did not feel that patient had acute worsening of her surgical condition that would necessitate readmission to the hospital or change in management at this time.  Patient reports her main symptom was feeling very weak and flushed.  Reports her abdominal pain was not significantly worsening and she had transition to using Tylenol. Physical Exam  BP (!) 155/70 (BP Location: Left Arm)   Pulse 95   Temp 98.4 F (36.9 C) (Oral)   Resp 15   SpO2 95%   Physical Exam  ED Course/Procedures     Procedures  MDM  After hydration and treatment emergency department, patient reports she feels much improved.  We reviewed her course of since her discharge 2 days ago.  At this time, feeling much improved patient does feel she is all right to go home.  She will continue all her home medications.  Discussed return precautions and close follow-up.       Charlesetta Shanks, MD 06/23/21 2031

## 2021-06-23 NOTE — ED Provider Notes (Signed)
[facility] DEPT Provider Note   CSN: [id number] Arrival date & time: 06/23/21  1134     History Chief Complaint  Patient presents with   Post-op Problem    Verita Kuroda is a 60 y.o. female.  60 year old female presents with ongoing abdominal pain along with generalized malaise.  She had an intra-abdominal washout for last few days and has had persistent pain since that time.  Had temperature at home up to 101 degrees.  Notes some leakage around her colostomy bag.  Slight shortness of breath but no cough.  States she has been unable to urinate but denies any suprapubic pressure.  Abdominal pain is characterized as persistent.  No treatment use prior to arrival      Past Medical History:  Diagnosis Date   Arthritis    Asthma    Diverticulitis    Hyperlipidemia    Lupus ([place])    Panic attacks    Pneumonia 09/2020   with covid   PONV (postoperative nausea and vomiting)    Seasonal allergies     Patient Active Problem List   Diagnosis Date Noted   Dehiscence of postoperative wound of abdomen 06/19/2021   Intra-abdominal abscess ([place])    Status post right knee replacement 05/20/2021   Unilateral primary osteoarthritis, right knee 05/19/2021   Effusion of right knee 02/03/2021   Acquired trigger finger of right ring finger 10/26/2020   Acute diverticulitis 09/30/2019   E coli bacteremia 09/30/2019   Thrombocytopenia ([place]) 09/30/2019   AKI (acute kidney injury) ([place]) 09/30/2019   LFT elevation 09/30/2019   Bacteriuria 08/11/2018   Hydronephrosis, left 08/11/2018   Diverticulitis 08/10/2018   Colonic diverticular abscess 08/10/2018   Lupus ([place]) 08/10/2018   Hyperlipidemia 08/10/2018   HTN (hypertension) 08/10/2018   Anxiety 08/10/2018    Past Surgical History:  Procedure Laterality Date   COLON RESECTION SIGMOID N/A 06/10/2021   Procedure: OPEN SIGMOID COLON RESECTION WITH END COLOSTOMY AND ABDOMINAL WASHOUT;  Surgeon: Dwan Bolt, MD;  Location: [facility];  Service: General;  Laterality: N/A;   CYSTOSCOPY WITH STENT PLACEMENT  06/10/2021   Procedure: CYSTOSCOPY WITH LEFT STENT PLACEMENT;  Surgeon: Dwan Bolt, MD;  Location: [facility];  Service: General;;   LAPAROTOMY N/A 06/19/2021   Procedure: EXPLORATORY LAPAROTOMY;  Surgeon: Erroll Luna, MD;  Location: [facility];  Service: General;  Laterality: N/A;   PATELLA FRACTURE SURGERY Left    scar tissue eye  1983   right   TOTAL KNEE ARTHROPLASTY Right 05/20/2021   Procedure: RIGHT TOTAL KNEE ARTHROPLASTY;  Surgeon: Mcarthur Rossetti, MD;  Location: [facility];  Service: Orthopedics;  Laterality: Right;  Needs RNFA   TUBAL LIGATION  1985   WRIST FRACTURE SURGERY Right      OB History   No obstetric history on file.     Family History  Problem Relation Age of Onset   Colon cancer Neg Hx    Stomach cancer Neg Hx    Esophageal cancer Neg Hx    Rectal cancer Neg Hx     Social History   Tobacco Use   Smoking status: Every Day    Packs/day: 0.30    Years: 30.00    Pack years: 9.00    Types: Cigarettes   Smokeless tobacco: Never   Tobacco comments:    Trying to quit  Vaping Use   Vaping Use: Never used  Substance Use Topics   Alcohol use: No   Drug  use: Yes    Frequency: 7.0 times per week    Types: Marijuana    Comment: CBD    Home Medications Prior to Admission medications   Medication Sig Start Date End Date Taking? Authorizing Provider  albuterol (VENTOLIN HFA) 108 (90 Base) MCG/ACT inhaler Inhale 2 puffs into the lungs every 6 (six) hours as needed for wheezing or shortness of breath.    [provider]  aspirin 81 MG chewable tablet Chew 1 tablet (81 mg total) by mouth 2 (two) times daily. 05/21/21   Mcarthur Rossetti, MD  carboxymethylcellulose (REFRESH PLUS) 0.5 % SOLN Place 1 drop into both eyes 3 (three) times daily as needed (dry eyes).    [provider]  ciprofloxacin (CIPRO) 500 MG tablet Take 1 tablet (500  mg total) by mouth 2 (two) times daily for 14 days. 06/18/21 07/02/21  Rolm Bookbinder, MD  EC-NAPROXEN 500 MG EC tablet Take 500 mg by mouth 2 (two) times daily. 05/03/21   [provider]  escitalopram (LEXAPRO) 20 MG tablet Take 20 mg by mouth daily.    [provider]  fluconazole (DIFLUCAN) 200 MG tablet Take 4 tablets (800 mg total) by mouth daily for 14 days. 06/19/21 07/03/21  Rolm Bookbinder, MD  gabapentin (NEURONTIN) 400 MG capsule Take 800 mg by mouth 3 (three) times daily.    [provider]  HYDROcodone-acetaminophen (NORCO/VICODIN) 5-325 MG tablet Take 1-2 tablets by mouth every 6 (six) hours as needed for moderate pain. 06/01/21   Mcarthur Rossetti, MD  LORazepam (ATIVAN) 0.5 MG tablet Take 0.5-1 mg by mouth See admin instructions. 0.5mg  in AM and 1mg  in evening 09/03/19   [provider]  metoprolol tartrate (LOPRESSOR) 25 MG tablet Take 12.5 mg by mouth daily. 12/27/20   [provider]  metroNIDAZOLE (FLAGYL) 500 MG tablet Take 1 tablet (500 mg total) by mouth 2 (two) times daily for 14 days. 06/18/21 07/02/21  Rolm Bookbinder, MD  nystatin (MYCOSTATIN) 100000 UNIT/ML suspension Take 5 mLs by mouth 4 (four) times daily as needed (dry mouth). 02/04/21   [provider]  ondansetron (ZOFRAN ODT) 4 MG disintegrating tablet Take 1 tablet (4 mg total) by mouth every 8 (eight) hours as needed for nausea or vomiting. 05/24/21   Mcarthur Rossetti, MD  oxyCODONE (OXY IR/ROXICODONE) 5 MG immediate release tablet Take 1 tablet (5 mg total) by mouth every 6 (six) hours as needed for moderate pain, severe pain or breakthrough pain. 06/18/21   Rolm Bookbinder, MD  predniSONE (DELTASONE) 10 MG tablet Take 10 mg by mouth daily with breakfast.    [provider]  tiZANidine (ZANAFLEX) 4 MG tablet Take 1 tablet (4 mg total) by mouth every 8 (eight) hours as needed for muscle spasms. 05/21/21   Mcarthur Rossetti, MD     Allergies    Contrast media [iodinated diagnostic agents], Iodine, Betadine [povidone iodine], Methocarbamol, and Sulfa antibiotics  Review of Systems   Review of Systems  All other systems reviewed and are negative.  Physical Exam Updated Vital Signs BP (!) 149/77 (BP Location: Left Arm)   Pulse 77   Temp 98.4 F (36.9 C) (Oral)   Resp 20   SpO2 100%   Physical Exam Vitals and nursing note reviewed.  Constitutional:      General: She is not in acute distress.    Appearance: Normal appearance. She is well-developed. She is not toxic-appearing.  HENT:     Head: Normocephalic and atraumatic.  Eyes:     General: Lids are normal.     Conjunctiva/sclera: Conjunctivae normal.     Pupils: Pupils are equal, round, and reactive to light.  Neck:     Thyroid: No thyroid mass.     Trachea: No tracheal deviation.  Cardiovascular:     Rate and Rhythm: Normal rate and regular rhythm.     Heart sounds: Normal heart sounds. No murmur heard.   No gallop.  Pulmonary:     Effort: Pulmonary effort is normal. No respiratory distress.     Breath sounds: Normal breath sounds. No stridor. No decreased breath sounds, wheezing, rhonchi or rales.  Abdominal:     General: There is no distension.     Palpations: Abdomen is soft.     Tenderness: There is no abdominal tenderness. There is no rebound.     Comments: Leakage around colostomy bag noted.  No peritoneal signs  Musculoskeletal:        General: No tenderness. Normal range of motion.     Cervical back: Normal range of motion and neck supple.  Skin:    General: Skin is warm and dry.     Findings: No abrasion or rash.  Neurological:     Mental Status: She is alert and oriented to person, place, and time. Mental status is at baseline.     GCS: GCS eye subscore is 4. GCS verbal subscore is 5. GCS motor subscore is 6.     Cranial Nerves: Cranial nerves are intact. No cranial nerve deficit.     Sensory: No sensory deficit.     Motor:  Motor function is intact.  Psychiatric:        Attention and Perception: Attention normal.        Mood and Affect: Mood is anxious.        Speech: Speech normal.        Behavior: Behavior normal.    ED Results / Procedures / Treatments   Labs (all labs ordered are listed, but only abnormal results are displayed) Labs Reviewed  COMPREHENSIVE METABOLIC PANEL - Abnormal; Notable for the following components:      Result Value   Potassium 3.4 (*)    Glucose, Bld 128 (*)    Albumin 3.2 (*)    All other components within normal limits  CBC WITH DIFFERENTIAL/PLATELET - Abnormal; Notable for the following components:   WBC 15.0 (*)    RDW 16.9 (*)    Platelets 567 (*)    Neutro Abs 13.2 (*)    All other components within normal limits  RESP PANEL BY RT-PCR (FLU A&B, COVID) ARPGX2  CULTURE, BLOOD (ROUTINE X 2)  CULTURE, BLOOD (ROUTINE X 2)  URINALYSIS, ROUTINE W REFLEX MICROSCOPIC  LACTIC ACID, PLASMA  LIPASE, BLOOD    EKG None  Radiology No results found.  Procedures Procedures   Medications Ordered in ED Medications  lactated ringers bolus 1,000 mL (has no administration in time range)  lactated ringers infusion (has no administration in time range)  morphine 4 MG/ML injection 6 mg (has no administration in time range)    ED Course  I have reviewed the triage vital signs and the nursing notes.  Pertinent labs & imaging results that were available during my care of the patient were reviewed by me and considered in my medical decision making (see chart for details).    MDM Rules/Calculators/A&P  Patient with mild leukocytosis on blood work.  Abdominal CT is questionable for residual infection.  General surgery has been consulted and discussed with Dr. Marcello Moores will see patient Final Clinical Impression(s) / ED Diagnoses Final diagnoses:  None    Rx / DC Orders ED Discharge Orders     None        Lacretia Leigh, MD 06/23/21  1555

## 2021-06-23 NOTE — ED Triage Notes (Signed)
Per EMS, she had emergency colostomy this week. She was released, sent home. Today colostomy is leaking and incision is opening.

## 2021-06-24 LAB — AEROBIC/ANAEROBIC CULTURE W GRAM STAIN (SURGICAL/DEEP WOUND)

## 2021-06-28 LAB — CULTURE, BLOOD (ROUTINE X 2)
Culture: NO GROWTH
Culture: NO GROWTH

## 2021-06-29 ENCOUNTER — Inpatient Hospital Stay: Payer: BC Managed Care – PPO | Admitting: Internal Medicine

## 2021-06-30 ENCOUNTER — Encounter: Payer: BC Managed Care – PPO | Admitting: Orthopaedic Surgery

## 2021-07-01 ENCOUNTER — Other Ambulatory Visit: Payer: Self-pay

## 2021-07-01 ENCOUNTER — Encounter (HOSPITAL_COMMUNITY)
Admission: RE | Admit: 2021-07-01 | Discharge: 2021-07-01 | Disposition: A | Discharge status: Home / Self Care | Payer: BC Managed Care – PPO | Source: Ambulatory Visit | Attending: Nurse Practitioner | Admitting: Nurse Practitioner

## 2021-07-01 DIAGNOSIS — T8140XA Infection following a procedure, unspecified, initial encounter: Secondary | ICD-10-CM | POA: Insufficient documentation

## 2021-07-01 DIAGNOSIS — K9400 Colostomy complication, unspecified: Secondary | ICD-10-CM

## 2021-07-01 DIAGNOSIS — Z933 Colostomy status: Secondary | ICD-10-CM | POA: Insufficient documentation

## 2021-07-01 DIAGNOSIS — Z7952 Long term (current) use of systemic steroids: Secondary | ICD-10-CM | POA: Insufficient documentation

## 2021-07-01 DIAGNOSIS — M329 Systemic lupus erythematosus, unspecified: Secondary | ICD-10-CM | POA: Diagnosis not present

## 2021-07-01 NOTE — Progress Notes (Signed)
[facility]   Reason for visit:  LUQ colostomy with wound dehiscence to midline abdominal wound with  red rubber retention strips in place to wound.  Stomal separation to ostomy HPI:  Perforated diverticulitis with post op infection Chronic steroid use due to lupus ROS  Review of Systems  Gastrointestinal:  Positive for abdominal pain.       Midline open surgical wound with retention strips in place.    Psychiatric/Behavioral:  The patient is nervous/anxious.   Vital signs:  BP (!) 80/44 (BP Location: Right Arm)   Pulse 80   Temp (!) 97.5 F (36.4 C) (Oral)   Resp 18   Ht 5\' 5"  (1.651 m)   Wt 53.5 kg   SpO2 99%   BMI 19.64 kg/m  Exam:  Physical Exam Vitals reviewed.  Abdominal:     Palpations: Abdomen is soft.     Tenderness: There is abdominal tenderness.    Skin:    General: Skin is warm and dry.     Findings: Wound present.     Comments: Midline open surgical wound  Neurological:     Mental Status: She is alert.  Psychiatric:        Mood and Affect: Mood normal.    Stoma type/location:  LUQ colostomy with stomal separation from 3 o'clock to 9 o'clock.  Stomal assessment/size:  7/8" flush, with separation measuring 2 cm x 3 cm  Peristomal assessment:  separation  Midline incision is 6 cm from stoma.  We will cut off center to avoid overlap.  Pattern will be sent home with patient.  COnvex pouch, barrier ring (not paste) and ostomy belt.  Wound care to separation with aquacel ag silver hydrofiber.  (Allergy to sulfa drugs is GI upset) peristomal skin is intact Treatment options for stomal/peristomal skin: see above Output: soft brown stool Ostomy pouching: 1pc.convex with barrier ring, aquacel to peristomal defect and ostomy belt. I added barrier strips to pouch perimeter for added security and to keep stool out of surgical wound.  Education provided:  Daughter with patient.  We perform wound care and pouch change, I teach them wound care to separation,  cover with barrier ring around stoma and cut pouch off center to stay away from surgical incision.  I sent them home with the pattern to cut.   I performed wound care to abdominal wound. Weaving NS damp gauze under retention straps.  Topped with dry gauze and medipore tape.  Sent home with roll of tape     Impression/dx  Stomal separation Flush stoma Midline open surgical wound with retention strips in place.  Discussion  New pouching plan.  Wound care to separation and wound care to surgical wound perform.  Plan  Call Monday (or email) and let me know how pouch performed.     Visit time: 65 minutes.   Domenic Moras FNP-BC

## 2021-07-05 ENCOUNTER — Other Ambulatory Visit: Payer: Self-pay

## 2021-07-05 ENCOUNTER — Ambulatory Visit (HOSPITAL_COMMUNITY)
Admission: RE | Admit: 2021-07-05 | Discharge: 2021-07-05 | Disposition: A | Discharge status: Home / Self Care | Payer: BC Managed Care – PPO | Source: Ambulatory Visit | Attending: Nurse Practitioner | Admitting: Nurse Practitioner

## 2021-07-05 DIAGNOSIS — L24B1 Irritant contact dermatitis related to digestive stoma or fistula: Secondary | ICD-10-CM

## 2021-07-05 DIAGNOSIS — Z933 Colostomy status: Secondary | ICD-10-CM | POA: Diagnosis present

## 2021-07-05 DIAGNOSIS — K9400 Colostomy complication, unspecified: Secondary | ICD-10-CM

## 2021-07-07 NOTE — Progress Notes (Signed)
[facility]   Reason for visit:  LUQ colostomy with wound dehiscence.  HPI:  Perforated diverticulum ROS  Review of Systems  Gastrointestinal:  Positive for abdominal pain.       LUQ colostomy Midline surgical incision  Skin:  Positive for rash and wound.       Stomal separation Midline surgical wound Denuded skin to peristomal  All other systems reviewed and are negative. Vital signs:  BP (!) 141/92   Pulse 64   Temp 97.6 F (36.4 C) (Oral)   Resp 17   SpO2 100%  Exam:  Physical Exam  Stoma type/location:  LUQ colostomy Stomal assessment/size:  1" flush with stomal separation.  Continue wound care.  Patient states pouch is in place with no issues.  Peristomal assessment:  separation.  Wound care continues with aquacel ag Treatment options for stomal/peristomal skin: 1 piece convex with barrier ring  Output: soft brown stool Ostomy pouching: 1pc.convex Education provided:  Patient feels like it is going ok at this time. Informed her I will be out of the office until 07/11/21    Impression/dx  Stomal separation Flush LUQ colostomy Discussion  Call clinic as needed.  Plan  Assist with ordering supplies.     Visit time: 45 minutes.   Domenic Moras FNP-BC

## 2021-07-11 NOTE — Discharge Instructions (Signed)
COntinue with wound care to separation and 1 piece pouch

## 2021-07-12 DIAGNOSIS — Z933 Colostomy status: Secondary | ICD-10-CM

## 2021-07-14 ENCOUNTER — Inpatient Hospital Stay: Payer: BC Managed Care – PPO | Admitting: Internal Medicine

## 2021-07-18 ENCOUNTER — Inpatient Hospital Stay: Payer: BC Managed Care – PPO | Admitting: Internal Medicine

## 2021-07-18 ENCOUNTER — Ambulatory Visit (HOSPITAL_COMMUNITY): Payer: BC Managed Care – PPO

## 2021-07-19 ENCOUNTER — Ambulatory Visit (HOSPITAL_COMMUNITY): Payer: BC Managed Care – PPO

## 2021-07-22 ENCOUNTER — Ambulatory Visit: Payer: Self-pay | Admitting: Neurology

## 2021-08-23 NOTE — Progress Notes (Deleted)
[facility] [place] Phone: [phone] Subjective:    I'm seeing this patient by the request  of:  Berkley Harvey, NP  CC:   [id number]  12/21/2020 Patient actually is not having any significant triggering at this moment.  Patient's hands though do appear that patient may be having more of a synovitis and seems to be possibly a lupus flare.  Patient has had this since she was the age of 54 and will follow up with her primary care provider.  Home exercises.  Follow-up with me again in 6 weeks to further evaluate.   Did discuss with patient about the possibility of turmeric.  Discussed side effects.  Discussed not taking it with anti-inflammatories.  We will see how patient responds.  Updated 08/24/2021 Brittany Hobbs is a 60 y.o. female coming in with complaint of ***  Onset-  Location Duration-  Character- Aggravating factors- Reliving factors-  Therapies tried-  Severity-     Past Medical History:  Diagnosis Date   Arthritis    Asthma    Diverticulitis    Hyperlipidemia    Lupus ([place])    Panic attacks    Pneumonia 09/2020   with covid   PONV (postoperative nausea and vomiting)    Seasonal allergies    Past Surgical History:  Procedure Laterality Date   COLON RESECTION SIGMOID N/A 06/10/2021   Procedure: OPEN SIGMOID COLON RESECTION WITH END COLOSTOMY AND ABDOMINAL WASHOUT;  Surgeon: Dwan Bolt, MD;  Location: [facility];  Service: General;  Laterality: N/A;   CYSTOSCOPY WITH STENT PLACEMENT  06/10/2021   Procedure: CYSTOSCOPY WITH LEFT STENT PLACEMENT;  Surgeon: Dwan Bolt, MD;  Location: [facility];  Service: General;;   LAPAROTOMY N/A 06/19/2021   Procedure: EXPLORATORY LAPAROTOMY;  Surgeon: Erroll Luna, MD;  Location: [facility];  Service: General;  Laterality: N/A;   PATELLA FRACTURE SURGERY Left    scar tissue eye  1983   right   TOTAL KNEE ARTHROPLASTY Right 05/20/2021   Procedure: RIGHT  TOTAL KNEE ARTHROPLASTY;  Surgeon: Mcarthur Rossetti, MD;  Location: [facility];  Service: Orthopedics;  Laterality: Right;  Needs RNFA   TUBAL LIGATION  1985   WRIST FRACTURE SURGERY Right    Social History   Socioeconomic History   Marital status: Widowed    Spouse name: Not on file   Number of children: Not on file   Years of education: Not on file   Highest education level: Not on file  Occupational History   Not on file  Tobacco Use   Smoking status: Every Day    Packs/day: 0.30    Years: 30.00    Pack years: 9.00    Types: Cigarettes   Smokeless tobacco: Never   Tobacco comments:    Trying to quit  Vaping Use   Vaping Use: Never used  Substance and Sexual Activity   Alcohol use: No   Drug use: Yes    Frequency: 7.0 times per week    Types: Marijuana    Comment: CBD   Sexual activity: Not on file  Other Topics Concern   Not on file  Social History Narrative   Not on file   Social Determinants of Health   Financial Resource Strain: Not on file  Food Insecurity: Not on file  Transportation Needs: Not on file  Physical Activity: Not on file  Stress: Not on file  Social Connections: Not on file  Allergies  Allergen Reactions   Contrast Media [Iodinated Diagnostic Agents] Anaphylaxis   Iodine Anaphylaxis    Pt states she is allergic to INTERNAL IODINE   Betadine [Povidone Iodine] Hives   Methocarbamol Nausea Only    Felt nauseated and sick   Sulfa Antibiotics     N/V   Family History  Problem Relation Age of Onset   Colon cancer Neg Hx    Stomach cancer Neg Hx    Esophageal cancer Neg Hx    Rectal cancer Neg Hx     Current Outpatient Medications (Endocrine & Metabolic):    predniSONE (DELTASONE) 10 MG tablet, Take 10 mg by mouth daily with breakfast.  Current Outpatient Medications (Cardiovascular):    metoprolol tartrate (LOPRESSOR) 25 MG tablet, Take 12.5 mg by mouth daily.  Current Outpatient Medications (Respiratory):    albuterol  (VENTOLIN HFA) 108 (90 Base) MCG/ACT inhaler, Inhale 2 puffs into the lungs every 6 (six) hours as needed for wheezing or shortness of breath.  Current Outpatient Medications (Analgesics):    aspirin 81 MG chewable tablet, Chew 1 tablet (81 mg total) by mouth 2 (two) times daily.   EC-NAPROXEN 500 MG EC tablet, Take 500 mg by mouth 2 (two) times daily.   HYDROcodone-acetaminophen (NORCO/VICODIN) 5-325 MG tablet, Take 1-2 tablets by mouth every 6 (six) hours as needed for moderate pain.   oxyCODONE (OXY IR/ROXICODONE) 5 MG immediate release tablet, Take 1 tablet (5 mg total) by mouth every 6 (six) hours as needed for moderate pain, severe pain or breakthrough pain.   Current Outpatient Medications (Other):    carboxymethylcellulose (REFRESH PLUS) 0.5 % SOLN, Place 1 drop into both eyes 3 (three) times daily as needed (dry eyes).   escitalopram (LEXAPRO) 20 MG tablet, Take 20 mg by mouth daily.   gabapentin (NEURONTIN) 400 MG capsule, Take 800 mg by mouth 3 (three) times daily.   LORazepam (ATIVAN) 0.5 MG tablet, Take 0.5-1 mg by mouth See admin instructions. 0.5mg  in AM and 1mg  in evening   nystatin (MYCOSTATIN) 100000 UNIT/ML suspension, Take 5 mLs by mouth 4 (four) times daily as needed (dry mouth).   ondansetron (ZOFRAN ODT) 4 MG disintegrating tablet, Take 1 tablet (4 mg total) by mouth every 8 (eight) hours as needed for nausea or vomiting.   tiZANidine (ZANAFLEX) 4 MG tablet, Take 1 tablet (4 mg total) by mouth every 8 (eight) hours as needed for muscle spasms.   Reviewed prior external information including notes and imaging from  primary care provider As well as notes that were available from care everywhere and other healthcare systems.  Past medical history, social, surgical and family history all reviewed in electronic medical record.  No pertanent information unless stated regarding to the chief complaint.   Review of Systems:  No headache, visual changes, nausea, vomiting,  diarrhea, constipation, dizziness, abdominal pain, skin rash, fevers, chills, night sweats, weight loss, swollen lymph nodes, body aches, joint swelling, chest pain, shortness of breath, mood changes. POSITIVE muscle aches  Objective  There were no vitals taken for this visit.   General: No apparent distress alert and oriented x3 mood and affect normal, dressed appropriately.  HEENT: Pupils equal, extraocular movements intact  Respiratory: Patient's speak in full sentences and does not appear short of breath  Cardiovascular: No lower extremity edema, non tender, no erythema  Gait normal with good balance and coordination.  MSK:  Non tender with full range of motion and good stability and symmetric strength and tone of  shoulders, elbows, wrist, hip, knee and ankles bilaterally.     Impression and Recommendations:     The above documentation has been reviewed and is accurate and complete Brittany Hobbs

## 2021-08-24 ENCOUNTER — Ambulatory Visit: Payer: BC Managed Care – PPO | Admitting: Family Medicine

## 2021-08-25 ENCOUNTER — Emergency Department (HOSPITAL_COMMUNITY): Payer: BC Managed Care – PPO

## 2021-08-25 ENCOUNTER — Other Ambulatory Visit: Payer: Self-pay

## 2021-08-25 ENCOUNTER — Inpatient Hospital Stay (HOSPITAL_COMMUNITY)
Admission: EM | Admit: 2021-08-25 | Discharge: 2021-09-05 | DRG: 504 | Disposition: A | Discharge status: Home / Self Care | Payer: BC Managed Care – PPO | Attending: General Surgery | Admitting: General Surgery

## 2021-08-25 ENCOUNTER — Encounter (HOSPITAL_COMMUNITY): Payer: Self-pay

## 2021-08-25 DIAGNOSIS — M4854XA Collapsed vertebra, not elsewhere classified, thoracic region, initial encounter for fracture: Secondary | ICD-10-CM | POA: Diagnosis present

## 2021-08-25 DIAGNOSIS — F17210 Nicotine dependence, cigarettes, uncomplicated: Secondary | ICD-10-CM | POA: Diagnosis present

## 2021-08-25 DIAGNOSIS — S0990XA Unspecified injury of head, initial encounter: Secondary | ICD-10-CM

## 2021-08-25 DIAGNOSIS — Z20822 Contact with and (suspected) exposure to covid-19: Secondary | ICD-10-CM | POA: Diagnosis present

## 2021-08-25 DIAGNOSIS — S92061A Displaced intraarticular fracture of right calcaneus, initial encounter for closed fracture: Secondary | ICD-10-CM | POA: Diagnosis not present

## 2021-08-25 DIAGNOSIS — Z781 Physical restraint status: Secondary | ICD-10-CM

## 2021-08-25 DIAGNOSIS — S52602A Unspecified fracture of lower end of left ulna, initial encounter for closed fracture: Secondary | ICD-10-CM

## 2021-08-25 DIAGNOSIS — Z79899 Other long term (current) drug therapy: Secondary | ICD-10-CM

## 2021-08-25 DIAGNOSIS — D62 Acute posthemorrhagic anemia: Secondary | ICD-10-CM | POA: Diagnosis not present

## 2021-08-25 DIAGNOSIS — Z933 Colostomy status: Secondary | ICD-10-CM

## 2021-08-25 DIAGNOSIS — S92001A Unspecified fracture of right calcaneus, initial encounter for closed fracture: Secondary | ICD-10-CM

## 2021-08-25 DIAGNOSIS — Z419 Encounter for procedure for purposes other than remedying health state, unspecified: Secondary | ICD-10-CM

## 2021-08-25 DIAGNOSIS — Z8616 Personal history of COVID-19: Secondary | ICD-10-CM

## 2021-08-25 DIAGNOSIS — Z8701 Personal history of pneumonia (recurrent): Secondary | ICD-10-CM

## 2021-08-25 DIAGNOSIS — Z96651 Presence of right artificial knee joint: Secondary | ICD-10-CM | POA: Diagnosis present

## 2021-08-25 DIAGNOSIS — Z7982 Long term (current) use of aspirin: Secondary | ICD-10-CM

## 2021-08-25 DIAGNOSIS — M4856XA Collapsed vertebra, not elsewhere classified, lumbar region, initial encounter for fracture: Secondary | ICD-10-CM | POA: Diagnosis present

## 2021-08-25 DIAGNOSIS — T1490XA Injury, unspecified, initial encounter: Secondary | ICD-10-CM | POA: Diagnosis not present

## 2021-08-25 DIAGNOSIS — F909 Attention-deficit hyperactivity disorder, unspecified type: Secondary | ICD-10-CM | POA: Diagnosis present

## 2021-08-25 DIAGNOSIS — S52252A Displaced comminuted fracture of shaft of ulna, left arm, initial encounter for closed fracture: Secondary | ICD-10-CM | POA: Diagnosis present

## 2021-08-25 DIAGNOSIS — F410 Panic disorder [episodic paroxysmal anxiety] without agoraphobia: Secondary | ICD-10-CM | POA: Diagnosis present

## 2021-08-25 DIAGNOSIS — J45909 Unspecified asthma, uncomplicated: Secondary | ICD-10-CM | POA: Diagnosis present

## 2021-08-25 DIAGNOSIS — I472 Ventricular tachycardia, unspecified: Secondary | ICD-10-CM | POA: Diagnosis not present

## 2021-08-25 DIAGNOSIS — Y92410 Unspecified street and highway as the place of occurrence of the external cause: Secondary | ICD-10-CM

## 2021-08-25 DIAGNOSIS — Z791 Long term (current) use of non-steroidal anti-inflammatories (NSAID): Secondary | ICD-10-CM

## 2021-08-25 DIAGNOSIS — S52202A Unspecified fracture of shaft of left ulna, initial encounter for closed fracture: Secondary | ICD-10-CM

## 2021-08-25 DIAGNOSIS — Z888 Allergy status to other drugs, medicaments and biological substances status: Secondary | ICD-10-CM

## 2021-08-25 DIAGNOSIS — F688 Other specified disorders of adult personality and behavior: Secondary | ICD-10-CM | POA: Diagnosis present

## 2021-08-25 DIAGNOSIS — Z9049 Acquired absence of other specified parts of digestive tract: Secondary | ICD-10-CM

## 2021-08-25 DIAGNOSIS — Z7952 Long term (current) use of systemic steroids: Secondary | ICD-10-CM

## 2021-08-25 DIAGNOSIS — Z91041 Radiographic dye allergy status: Secondary | ICD-10-CM

## 2021-08-25 DIAGNOSIS — Z882 Allergy status to sulfonamides status: Secondary | ICD-10-CM

## 2021-08-25 DIAGNOSIS — E785 Hyperlipidemia, unspecified: Secondary | ICD-10-CM | POA: Diagnosis present

## 2021-08-25 DIAGNOSIS — R4587 Impulsiveness: Secondary | ICD-10-CM | POA: Diagnosis present

## 2021-08-25 DIAGNOSIS — T148XXA Other injury of unspecified body region, initial encounter: Secondary | ICD-10-CM

## 2021-08-25 DIAGNOSIS — S2231XA Fracture of one rib, right side, initial encounter for closed fracture: Secondary | ICD-10-CM

## 2021-08-25 DIAGNOSIS — M1990 Unspecified osteoarthritis, unspecified site: Secondary | ICD-10-CM | POA: Diagnosis present

## 2021-08-25 LAB — COMPREHENSIVE METABOLIC PANEL
ALT: 20 U/L (ref 0–44)
AST: 37 U/L (ref 15–41)
Albumin: 3.7 g/dL (ref 3.5–5.0)
Alkaline Phosphatase: 69 U/L (ref 38–126)
Anion gap: 10 (ref 5–15)
BUN: 31 mg/dL — ABNORMAL HIGH (ref 6–20)
CO2: 23 mmol/L (ref 22–32)
Calcium: 9.4 mg/dL (ref 8.9–10.3)
Chloride: 101 mmol/L (ref 98–111)
Creatinine, Ser: 0.64 mg/dL (ref 0.44–1.00)
GFR, Estimated: >60 mL/min (ref >60)
Glucose, Bld: 102 mg/dL — ABNORMAL HIGH (ref 70–99)
Potassium: 3.4 mmol/L — ABNORMAL LOW (ref 3.5–5.1)
Sodium: 134 mmol/L — ABNORMAL LOW (ref 135–145)
Total Bilirubin: 0.5 mg/dL (ref 0.3–1.2)
Total Protein: 6.6 g/dL (ref 6.5–8.1)

## 2021-08-25 LAB — PROTIME-INR
INR: 1 (ref 0.8–1.2)
Prothrombin Time: 12.8 seconds (ref 11.4–15.2)

## 2021-08-25 LAB — URINALYSIS, ROUTINE W REFLEX MICROSCOPIC
Bilirubin Urine: NEGATIVE
Glucose, UA: NEGATIVE mg/dL
Hgb urine dipstick: NEGATIVE
Ketones, ur: NEGATIVE mg/dL
Leukocytes,Ua: NEGATIVE
Nitrite: NEGATIVE
Protein, ur: NEGATIVE mg/dL
Specific Gravity, Urine: 1.025 (ref 1.005–1.030)
pH: 6 (ref 5.0–8.0)

## 2021-08-25 LAB — RESP PANEL BY RT-PCR (FLU A&B, COVID) ARPGX2
Influenza A by PCR: NEGATIVE
Influenza B by PCR: NEGATIVE
SARS Coronavirus 2 by RT PCR: NEGATIVE

## 2021-08-25 LAB — I-STAT CHEM 8, ED
BUN: 31 mg/dL — ABNORMAL HIGH (ref 6–20)
Calcium, Ion: 1.25 mmol/L (ref 1.15–1.40)
Chloride: 102 mmol/L (ref 98–111)
Creatinine, Ser: 0.5 mg/dL (ref 0.44–1.00)
Glucose, Bld: 99 mg/dL (ref 70–99)
HCT: 40 % (ref 36.0–46.0)
Hemoglobin: 13.6 g/dL (ref 12.0–15.0)
Potassium: 3.2 mmol/L — ABNORMAL LOW (ref 3.5–5.1)
Sodium: 139 mmol/L (ref 135–145)
TCO2: 26 mmol/L (ref 22–32)

## 2021-08-25 LAB — CBC
HCT: 38.1 % (ref 36.0–46.0)
Hemoglobin: 11.8 g/dL — ABNORMAL LOW (ref 12.0–15.0)
MCH: 26.3 pg (ref 26.0–34.0)
MCHC: 31 g/dL (ref 30.0–36.0)
MCV: 85 fL (ref 80.0–100.0)
Platelets: 407 10*3/uL — ABNORMAL HIGH (ref 150–400)
RBC: 4.48 MIL/uL (ref 3.87–5.11)
RDW: 15.9 % — ABNORMAL HIGH (ref 11.5–15.5)
WBC: 14.6 10*3/uL — ABNORMAL HIGH (ref 4.0–10.5)
nRBC: 0 % (ref 0.0–0.2)

## 2021-08-25 LAB — SAMPLE TO BLOOD BANK

## 2021-08-25 LAB — ETHANOL: Alcohol, Ethyl (B): <10 mg/dL (ref <10)

## 2021-08-25 LAB — LACTIC ACID, PLASMA: Lactic Acid, Venous: 1.4 mmol/L (ref 0.5–1.9)
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
## 2021-08-25 MED ORDER — ACETAMINOPHEN 325 MG PO TABS
650.0000 mg | ORAL_TABLET | Freq: Four times a day (QID) | ORAL | Status: DC
  Administered 2021-08-26 – 2021-09-05 (×37): 650 mg via ORAL
  Filled 2021-08-25 (×39): qty 2

## 2021-08-25 MED ORDER — ONDANSETRON HCL 4 MG/2ML IJ SOLN
4.0000 mg | Freq: Once | INTRAMUSCULAR | Status: AC
  Administered 2021-08-25: 4 mg via INTRAVENOUS
  Filled 2021-08-25: qty 2

## 2021-08-25 MED ORDER — BACITRACIN ZINC 500 UNIT/GM EX OINT
1.0000 "application " | TOPICAL_OINTMENT | Freq: Two times a day (BID) | CUTANEOUS | Status: DC
  Administered 2021-08-25 – 2021-09-05 (×19): 1 via TOPICAL
  Filled 2021-08-25 (×2): qty 28.35

## 2021-08-25 MED ORDER — SCOPOLAMINE 1 MG/3DAYS TD PT72
1.0000 | MEDICATED_PATCH | TRANSDERMAL | Status: DC
  Administered 2021-08-26: 1.5 mg via TRANSDERMAL
  Filled 2021-08-25: qty 1

## 2021-08-25 MED ORDER — CYCLOBENZAPRINE HCL 5 MG PO TABS
5.0000 mg | ORAL_TABLET | Freq: Three times a day (TID) | ORAL | Status: DC | PRN
  Administered 2021-08-28 – 2021-09-03 (×3): 5 mg via ORAL
  Filled 2021-08-25 (×3): qty 1

## 2021-08-25 MED ORDER — METOPROLOL TARTRATE 5 MG/5ML IV SOLN
5.0000 mg | Freq: Four times a day (QID) | INTRAVENOUS | Status: DC | PRN
  Administered 2021-08-25: 17:00:00 5 mg via INTRAVENOUS
  Filled 2021-08-25: qty 5

## 2021-08-25 MED ORDER — ESCITALOPRAM OXALATE 20 MG PO TABS
20.0000 mg | ORAL_TABLET | Freq: Every day | ORAL | Status: DC
  Administered 2021-08-25 – 2021-09-05 (×12): 20 mg via ORAL
  Filled 2021-08-25 (×9): qty 1
  Filled 2021-08-25: qty 2
  Filled 2021-08-25 (×2): qty 1

## 2021-08-25 MED ORDER — METOPROLOL TARTRATE 12.5 MG HALF TABLET
12.5000 mg | ORAL_TABLET | Freq: Every day | ORAL | Status: DC
  Administered 2021-08-26 – 2021-09-05 (×11): 12.5 mg via ORAL
  Filled 2021-08-25 (×11): qty 1

## 2021-08-25 MED ORDER — MORPHINE SULFATE (PF) 2 MG/ML IV SOLN
2.0000 mg | INTRAVENOUS | Status: DC | PRN
  Administered 2021-08-25 – 2021-08-26 (×3): 2 mg via INTRAVENOUS
  Administered 2021-08-26: 4 mg via INTRAVENOUS
  Administered 2021-08-26 – 2021-08-27 (×2): 2 mg via INTRAVENOUS
  Administered 2021-08-28 – 2021-08-29 (×3): 4 mg via INTRAVENOUS
  Administered 2021-08-29 – 2021-08-30 (×3): 2 mg via INTRAVENOUS
  Filled 2021-08-25: qty 1
  Filled 2021-08-25 (×4): qty 2
  Filled 2021-08-25 (×4): qty 1
  Filled 2021-08-25: qty 2
  Filled 2021-08-25 (×3): qty 1

## 2021-08-25 MED ORDER — ONDANSETRON 4 MG PO TBDP: 4.0000 mg | ORAL_TABLET | Freq: Four times a day (QID) | ORAL | Status: DC | PRN

## 2021-08-25 MED ORDER — MORPHINE SULFATE (PF) 4 MG/ML IV SOLN
4.0000 mg | Freq: Once | INTRAVENOUS | Status: AC
  Administered 2021-08-25: 4 mg via INTRAVENOUS
  Filled 2021-08-25: qty 1

## 2021-08-25 MED ORDER — GABAPENTIN 400 MG PO CAPS
800.0000 mg | ORAL_CAPSULE | Freq: Three times a day (TID) | ORAL | Status: DC
  Administered 2021-08-26 – 2021-09-05 (×31): 800 mg via ORAL
  Filled 2021-08-25 (×32): qty 2

## 2021-08-25 MED ORDER — DOCUSATE SODIUM 100 MG PO CAPS
100.0000 mg | ORAL_CAPSULE | Freq: Two times a day (BID) | ORAL | Status: DC
  Administered 2021-08-25 – 2021-08-26 (×3): 100 mg via ORAL
  Filled 2021-08-25 (×3): qty 1

## 2021-08-25 MED ORDER — CEFAZOLIN SODIUM-DEXTROSE 2-4 GM/100ML-% IV SOLN
2.0000 g | INTRAVENOUS | Status: AC
  Administered 2021-08-26: 2 g via INTRAVENOUS
  Filled 2021-08-25 (×2): qty 100

## 2021-08-25 MED ORDER — ACETAMINOPHEN 500 MG PO TABS
1000.0000 mg | ORAL_TABLET | Freq: Once | ORAL | Status: AC
  Administered 2021-08-26: 1000 mg via ORAL
  Filled 2021-08-25: qty 2

## 2021-08-25 MED ORDER — ENOXAPARIN SODIUM 30 MG/0.3ML IJ SOSY
30.0000 mg | PREFILLED_SYRINGE | Freq: Two times a day (BID) | INTRAMUSCULAR | Status: DC
  Administered 2021-08-25 – 2021-09-05 (×22): 30 mg via SUBCUTANEOUS
  Filled 2021-08-25 (×23): qty 0.3

## 2021-08-25 MED ORDER — CHLORHEXIDINE GLUCONATE 4 % EX LIQD
60.0000 mL | Freq: Once | CUTANEOUS | Status: AC
  Administered 2021-08-26: 4 via TOPICAL
  Filled 2021-08-25: qty 60

## 2021-08-25 MED ORDER — LORAZEPAM 0.5 MG PO TABS
0.5000 mg | ORAL_TABLET | Freq: Every day | ORAL | Status: DC
  Administered 2021-08-27 – 2021-08-30 (×4): 0.5 mg via ORAL
  Filled 2021-08-25 (×4): qty 1

## 2021-08-25 MED ORDER — ALBUTEROL SULFATE (2.5 MG/3ML) 0.083% IN NEBU
3.0000 mL | INHALATION_SOLUTION | Freq: Four times a day (QID) | RESPIRATORY_TRACT | Status: DC | PRN
  Administered 2021-09-03: 3 mL via RESPIRATORY_TRACT
  Filled 2021-08-25: qty 3

## 2021-08-25 MED ORDER — PREDNISONE 10 MG PO TABS
10.0000 mg | ORAL_TABLET | Freq: Every day | ORAL | Status: DC
  Administered 2021-08-26 – 2021-09-05 (×11): 10 mg via ORAL
  Filled 2021-08-25 (×11): qty 1

## 2021-08-25 MED ORDER — ONDANSETRON HCL 4 MG/2ML IJ SOLN: 4.0000 mg | Freq: Four times a day (QID) | INTRAMUSCULAR | Status: DC | PRN

## 2021-08-25 MED ORDER — POTASSIUM CHLORIDE IN NACL 20-0.9 MEQ/L-% IV SOLN
INTRAVENOUS | Status: DC
  Filled 2021-08-25 (×13): qty 1000

## 2021-08-25 MED ORDER — LORAZEPAM 1 MG PO TABS
1.0000 mg | ORAL_TABLET | Freq: Every day | ORAL | Status: DC
  Administered 2021-08-25 – 2021-09-04 (×11): 1 mg via ORAL
  Filled 2021-08-25 (×11): qty 1

## 2021-08-25 MED ORDER — NYSTATIN 100000 UNIT/ML MT SUSP
5.0000 mL | Freq: Four times a day (QID) | OROMUCOSAL | Status: DC | PRN
  Filled 2021-08-25: qty 5

## 2021-08-25 MED ORDER — POLYETHYLENE GLYCOL 3350 17 G PO PACK: 17.0000 g | PACK | Freq: Every day | ORAL | Status: DC | PRN

## 2021-08-25 MED ORDER — POLYVINYL ALCOHOL 1.4 % OP SOLN
1.0000 [drp] | Freq: Three times a day (TID) | OPHTHALMIC | Status: DC | PRN
  Filled 2021-08-25: qty 15

## 2021-08-25 MED ORDER — OXYCODONE HCL 5 MG PO TABS
5.0000 mg | ORAL_TABLET | ORAL | Status: DC | PRN
  Administered 2021-08-25: 17:00:00 5 mg via ORAL
  Administered 2021-08-25: 10 mg via ORAL
  Administered 2021-08-27: 5 mg via ORAL
  Administered 2021-08-27 – 2021-08-30 (×8): 10 mg via ORAL
  Administered 2021-08-30: 5 mg via ORAL
  Administered 2021-08-31 – 2021-09-03 (×11): 10 mg via ORAL
  Administered 2021-09-03 (×2): 5 mg via ORAL
  Administered 2021-09-03 – 2021-09-05 (×4): 10 mg via ORAL
  Filled 2021-08-25 (×3): qty 2
  Filled 2021-08-25 (×2): qty 1
  Filled 2021-08-25: qty 2
  Filled 2021-08-25: qty 1
  Filled 2021-08-25 (×10): qty 2
  Filled 2021-08-25: qty 1
  Filled 2021-08-25 (×12): qty 2
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
## 2021-08-25 NOTE — H&P (View-Only) (Signed)
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  (1.549 m), weight 54 kg, SpO2 95 %. Physical Exam Constitutional:      General: She is not in acute distress.    Appearance: She is well-developed. She is not diaphoretic.  HENT:     Head: Normocephalic and atraumatic.  Eyes:     General: No scleral icterus.       Right eye: No discharge.        Left eye: No discharge.     Conjunctiva/sclera: Conjunctivae normal.  Cardiovascular:     Rate and Rhythm: Normal rate and regular rhythm.  Pulmonary:     Effort: Pulmonary effort is normal. No respiratory distress.  Musculoskeletal:     Cervical back: Normal range of motion.     Comments: Left shoulder, elbow, wrist, digits- no skin wounds, TTP distal ulna/wrist, no instability, no blocks to motion  Sens  Ax/R/M/U intact  Mot   Ax/ R/ PIN/ M/ AIN/ U intact  Rad 2+  RLE No traumatic wounds, ecchymosis, or rash  TTP hindfoot  No knee or ankle effusion  Knee stable to varus/ valgus and anterior/posterior stress  Sens DPN, SPN, TN intact  Motor EHL, ext,  flex, evers 5/5  DP 2+, PT 2+, Mod hindfoot edema and ecchymosis  Skin:    General: Skin is warm and dry.  Neurological:     Mental Status: She is alert.  Psychiatric:        Mood and Affect: Mood normal.        Behavior:  Behavior normal.    Assessment/Plan: Left ulna fx -- Could treat operatively or non-operatively but given she'll need surgery on calcaneus would opt for operative fixation as will allow use of arm sooner. NWB LUE. Right calcaneus fx -- Will need ORIF, likely this admission but could be done electively. NWB RLE. Rib fx -- Pulmonary toilet already seems suspect. Have asked trauma service to evaluate, admit if they feel warranted also given her possible mental status change.    Lisette Abu, PA-C Orthopedic Surgery [phone] 08/25/2021, 10:11 AM

## 2021-08-25 NOTE — Progress Notes (Signed)
Imaging forearm and ankle reviewed. Comminuted calcaneus fx to be NWB in short leg splint. Discussed with Dr. Lucia Gaskins who will see her for the calcaneus and to discuss surgery.  Also, minimally displaced ulnar shaft fracture, NWB in sugar tong splint. To follow up with myself for discussion of operative vs nonop treatment for this fracture. Okay to discharge home and follow up outpatient from an orthopaedic standpoint.

## 2021-08-25 NOTE — ED Notes (Signed)
Patient wanted sister called Neita Carp [phone] sister is aware patient is here.

## 2021-08-25 NOTE — Discharge Planning (Signed)
Fuller Mandril, RN, BSN, [place] [phone] Pt qualifies for DME (Durable Medical Equipment) wheelchair.  DME  ordered through [facility].  Freda Munro of [facility] notified to deliver DME to pt room prior to D/C home.
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
## 2021-08-25 NOTE — Progress Notes (Signed)
Orthopedic Tech Progress Note Patient Details:  Mykelti Goldenstein Jan 28, 1961 [id number]  Ortho Devices Type of Ortho Device: Post (short leg) splint, Shoulder immobilizer, Sugartong splint Ortho Device/Splint Location: LUE/RLE Ortho Device/Splint Interventions: Application   Post Interventions Patient Tolerated: Well  Mushka Laconte A Abem Shaddix 08/25/2021, 11:42 AM

## 2021-08-25 NOTE — TOC CAGE-AID Note (Signed)
Transition of Care [facility]) - CAGE-AID Screening   Patient Details  Name: Moet Mikulski MRN: [id number] Date of Birth: [birth year]  [facility]) CM/SW Contact:    Gaetano Hawthorne Tarpley-Carter, [place] Phone Number: 08/25/2021, 1:18 PM   Clinical Narrative: Pt participated in [facility].  Pt stated she does use substance, and not ETOH.  Pt was offered resources, due to usage of substance.    Nadir Vasques Tarpley-Carter, MSW, LCSW-A Pronouns:  She/Her/Hers [facility] Clinical Social Worker Direct Number:  [phone] [email or web address]   CAGE-AID Screening:    Have You Ever Felt You Ought to Cut Down on Your Drinking or Drug Use?: No Have People Annoyed You By [employer] Your Drinking Or Drug Use?: No Have You Felt Bad Or Guilty About Your Drinking Or Drug Use?: No Have You Ever Had a Drink or Used Drugs First Thing In The Morning to Steady Your Nerves or to Get Rid of a Hangover?: No CAGE-AID Score: 0  Substance Abuse Education Offered: Yes  Substance abuse interventions: Scientist, clinical (histocompatibility and immunogenetics)
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
## 2021-08-25 NOTE — Consult Note (Addendum)
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  (1.549 m), weight 54 kg, SpO2 95 %. Physical Exam Constitutional:      General: She is not in acute distress.    Appearance: She is well-developed. She is not diaphoretic.  HENT:     Head: Normocephalic and atraumatic.  Eyes:     General: No scleral icterus.       Right eye: No discharge.        Left eye: No discharge.     Conjunctiva/sclera: Conjunctivae normal.  Cardiovascular:     Rate and Rhythm: Normal rate and regular rhythm.  Pulmonary:     Effort: Pulmonary effort is normal. No respiratory distress.  Musculoskeletal:     Cervical back: Normal range of motion.     Comments: Left shoulder, elbow, wrist, digits- no skin wounds, TTP distal ulna/wrist, no instability, no blocks to motion  Sens  Ax/R/M/U intact  Mot   Ax/ R/ PIN/ M/ AIN/ U intact  Rad 2+  RLE No traumatic wounds, ecchymosis, or rash  TTP hindfoot  No knee or ankle effusion  Knee stable to varus/ valgus and anterior/posterior stress  Sens DPN, SPN, TN intact  Motor EHL, ext,  flex, evers 5/5  DP 2+, PT 2+, Mod hindfoot edema and ecchymosis  Skin:    General: Skin is warm and dry.  Neurological:     Mental Status: She is alert.  Psychiatric:        Mood and Affect: Mood normal.        Behavior:  Behavior normal.    Assessment/Plan: Left ulna fx -- Could treat operatively or non-operatively but given she'll need surgery on calcaneus would opt for operative fixation as will allow use of arm sooner. NWB LUE. Right calcaneus fx -- Will need ORIF, likely this admission but could be done electively. NWB RLE. Rib fx -- Pulmonary toilet already seems suspect. Have asked trauma service to evaluate, admit if they feel warranted also given her possible mental status change.    Lisette Abu, PA-C Orthopedic Surgery [phone] 08/25/2021, 10:11 AM

## 2021-08-25 NOTE — ED Notes (Signed)
Patient to hallway 20 from xray

## 2021-08-26 ENCOUNTER — Observation Stay (HOSPITAL_COMMUNITY): Payer: BC Managed Care – PPO

## 2021-08-26 ENCOUNTER — Encounter (HOSPITAL_COMMUNITY): Admission: EM | Disposition: A | Discharge status: Home / Self Care | Payer: Self-pay | Source: Home / Self Care

## 2021-08-26 ENCOUNTER — Observation Stay (HOSPITAL_COMMUNITY): Payer: BC Managed Care – PPO | Admitting: Anesthesiology

## 2021-08-26 ENCOUNTER — Encounter (HOSPITAL_COMMUNITY): Payer: Self-pay

## 2021-08-26 DIAGNOSIS — S92061A Displaced intraarticular fracture of right calcaneus, initial encounter for closed fracture: Secondary | ICD-10-CM

## 2021-08-26 DIAGNOSIS — Z8616 Personal history of COVID-19: Secondary | ICD-10-CM | POA: Diagnosis not present

## 2021-08-26 DIAGNOSIS — I472 Ventricular tachycardia, unspecified: Secondary | ICD-10-CM | POA: Diagnosis not present

## 2021-08-26 DIAGNOSIS — S52202A Unspecified fracture of shaft of left ulna, initial encounter for closed fracture: Secondary | ICD-10-CM

## 2021-08-26 DIAGNOSIS — Z96651 Presence of right artificial knee joint: Secondary | ICD-10-CM | POA: Diagnosis present

## 2021-08-26 DIAGNOSIS — F909 Attention-deficit hyperactivity disorder, unspecified type: Secondary | ICD-10-CM | POA: Diagnosis present

## 2021-08-26 DIAGNOSIS — Z8701 Personal history of pneumonia (recurrent): Secondary | ICD-10-CM | POA: Diagnosis not present

## 2021-08-26 DIAGNOSIS — M4856XA Collapsed vertebra, not elsewhere classified, lumbar region, initial encounter for fracture: Secondary | ICD-10-CM | POA: Diagnosis present

## 2021-08-26 DIAGNOSIS — R4587 Impulsiveness: Secondary | ICD-10-CM | POA: Diagnosis present

## 2021-08-26 DIAGNOSIS — J45909 Unspecified asthma, uncomplicated: Secondary | ICD-10-CM | POA: Diagnosis present

## 2021-08-26 DIAGNOSIS — M1990 Unspecified osteoarthritis, unspecified site: Secondary | ICD-10-CM | POA: Diagnosis present

## 2021-08-26 DIAGNOSIS — F410 Panic disorder [episodic paroxysmal anxiety] without agoraphobia: Secondary | ICD-10-CM | POA: Diagnosis present

## 2021-08-26 DIAGNOSIS — Z882 Allergy status to sulfonamides status: Secondary | ICD-10-CM | POA: Diagnosis not present

## 2021-08-26 DIAGNOSIS — Z781 Physical restraint status: Secondary | ICD-10-CM | POA: Diagnosis not present

## 2021-08-26 DIAGNOSIS — E785 Hyperlipidemia, unspecified: Secondary | ICD-10-CM | POA: Diagnosis present

## 2021-08-26 DIAGNOSIS — D62 Acute posthemorrhagic anemia: Secondary | ICD-10-CM | POA: Diagnosis not present

## 2021-08-26 DIAGNOSIS — R419 Unspecified symptoms and signs involving cognitive functions and awareness: Secondary | ICD-10-CM | POA: Diagnosis not present

## 2021-08-26 DIAGNOSIS — S52252A Displaced comminuted fracture of shaft of ulna, left arm, initial encounter for closed fracture: Secondary | ICD-10-CM | POA: Diagnosis present

## 2021-08-26 DIAGNOSIS — Z20822 Contact with and (suspected) exposure to covid-19: Secondary | ICD-10-CM | POA: Diagnosis present

## 2021-08-26 DIAGNOSIS — Z9049 Acquired absence of other specified parts of digestive tract: Secondary | ICD-10-CM | POA: Diagnosis not present

## 2021-08-26 DIAGNOSIS — S2231XA Fracture of one rib, right side, initial encounter for closed fracture: Secondary | ICD-10-CM | POA: Diagnosis present

## 2021-08-26 DIAGNOSIS — M4854XA Collapsed vertebra, not elsewhere classified, thoracic region, initial encounter for fracture: Secondary | ICD-10-CM | POA: Diagnosis present

## 2021-08-26 DIAGNOSIS — R4182 Altered mental status, unspecified: Secondary | ICD-10-CM | POA: Diagnosis not present

## 2021-08-26 DIAGNOSIS — T1490XA Injury, unspecified, initial encounter: Secondary | ICD-10-CM | POA: Diagnosis present

## 2021-08-26 DIAGNOSIS — F688 Other specified disorders of adult personality and behavior: Secondary | ICD-10-CM | POA: Diagnosis present

## 2021-08-26 DIAGNOSIS — F419 Anxiety disorder, unspecified: Secondary | ICD-10-CM | POA: Diagnosis not present

## 2021-08-26 DIAGNOSIS — Z888 Allergy status to other drugs, medicaments and biological substances status: Secondary | ICD-10-CM | POA: Diagnosis not present

## 2021-08-26 DIAGNOSIS — Z933 Colostomy status: Secondary | ICD-10-CM | POA: Diagnosis not present

## 2021-08-26 DIAGNOSIS — F17210 Nicotine dependence, cigarettes, uncomplicated: Secondary | ICD-10-CM | POA: Diagnosis present

## 2021-08-26 DIAGNOSIS — Y92410 Unspecified street and highway as the place of occurrence of the external cause: Secondary | ICD-10-CM | POA: Diagnosis not present

## 2021-08-26 HISTORY — PX: ORIF CALCANEOUS FRACTURE: SHX5030

## 2021-08-26 LAB — SURGICAL PCR SCREEN
MRSA, PCR: POSITIVE — AB
Staphylococcus aureus: POSITIVE — AB

## 2021-08-26 LAB — BASIC METABOLIC PANEL
Anion gap: 11 (ref 5–15)
BUN: 22 mg/dL — ABNORMAL HIGH (ref 6–20)
CO2: 27 mmol/L (ref 22–32)
Calcium: 8.9 mg/dL (ref 8.9–10.3)
Chloride: 99 mmol/L (ref 98–111)
Creatinine, Ser: 0.64 mg/dL (ref 0.44–1.00)
GFR, Estimated: >60 mL/min (ref >60)
Glucose, Bld: 94 mg/dL (ref 70–99)
Potassium: 4.1 mmol/L (ref 3.5–5.1)
Sodium: 137 mmol/L (ref 135–145)

## 2021-08-26 LAB — CBC
HCT: 42.6 % (ref 36.0–46.0)
Hemoglobin: 13 g/dL (ref 12.0–15.0)
MCH: 25.9 pg — ABNORMAL LOW (ref 26.0–34.0)
MCHC: 30.5 g/dL (ref 30.0–36.0)
MCV: 85 fL (ref 80.0–100.0)
Platelets: 412 10*3/uL — ABNORMAL HIGH (ref 150–400)
RBC: 5.01 MIL/uL (ref 3.87–5.11)
RDW: 15.8 % — ABNORMAL HIGH (ref 11.5–15.5)
WBC: 12.2 10*3/uL — ABNORMAL HIGH (ref 4.0–10.5)
nRBC: 0 % (ref 0.0–0.2)

## 2021-08-26 LAB — VITAMIN D 25 HYDROXY (VIT D DEFICIENCY, FRACTURES): Vit D, 25-Hydroxy: 21.28 ng/mL — ABNORMAL LOW (ref 30–100)

## 2021-08-26 IMAGING — RF DG OS CALCIS 2+V*R*
1 series · 3 of 3 positions shown · non-contrast
Comparison: None.

CLINICAL DATA: ORIF right calcaneus

EXAM:
RIGHT OS CALCIS - 2+ VIEW; DG C-ARM 1-60 MIN-NO REPORT

[Series 1: run · 3 of 3 slices shown]
[im 1/3]
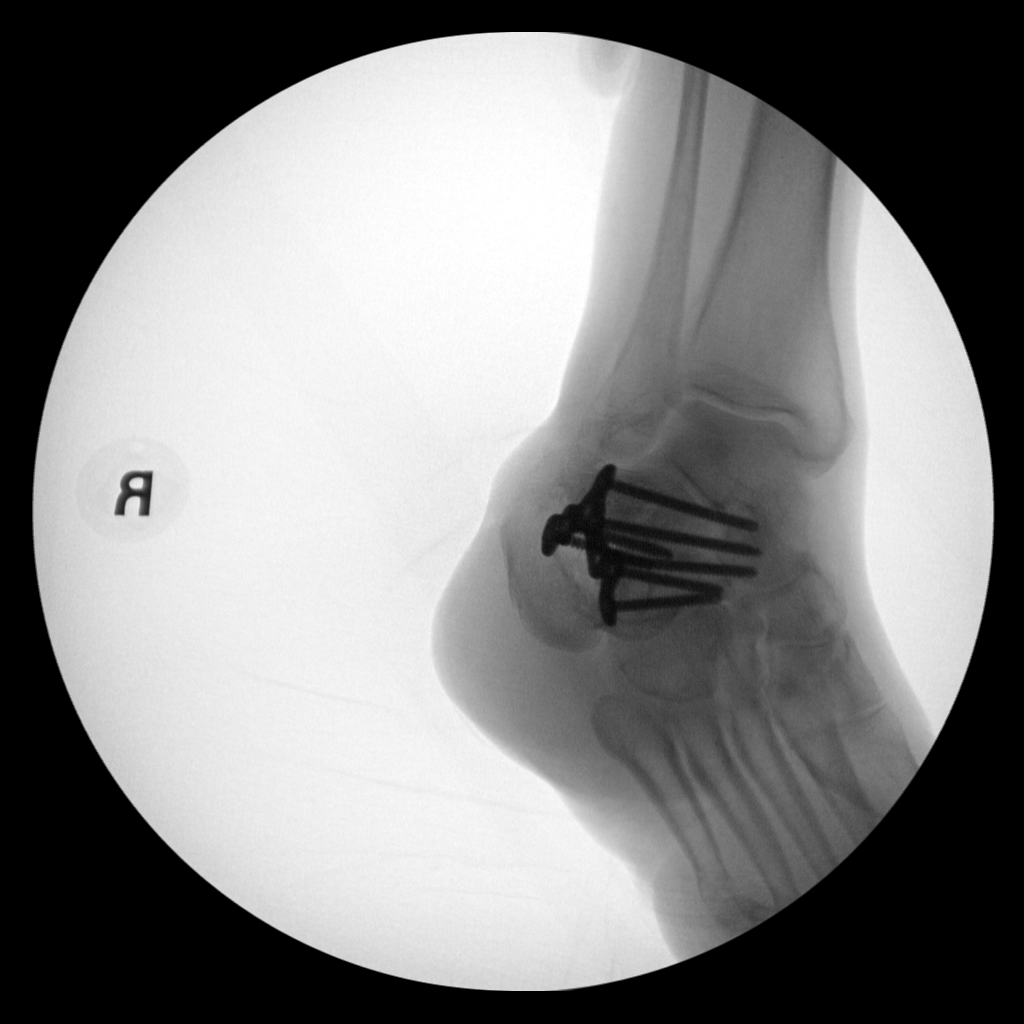
[im 2/3]
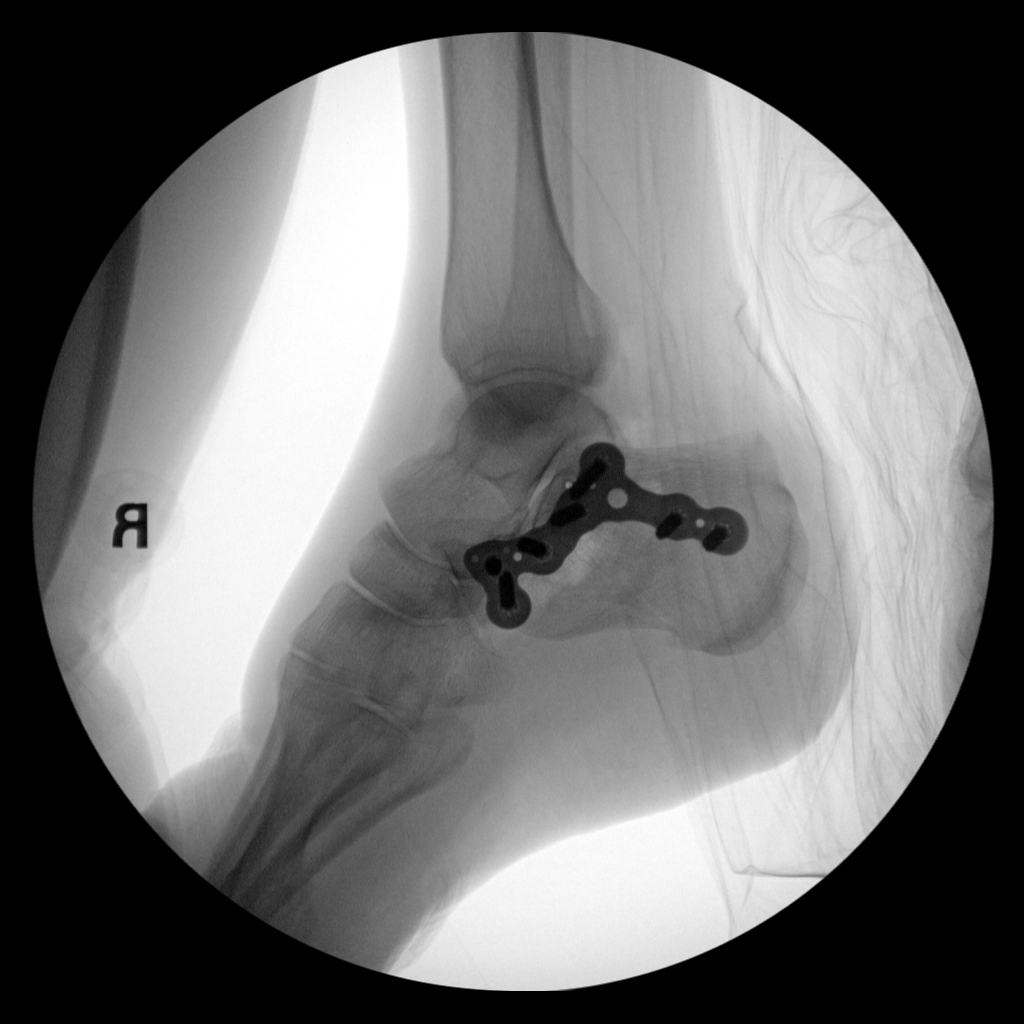
[im 3/3]
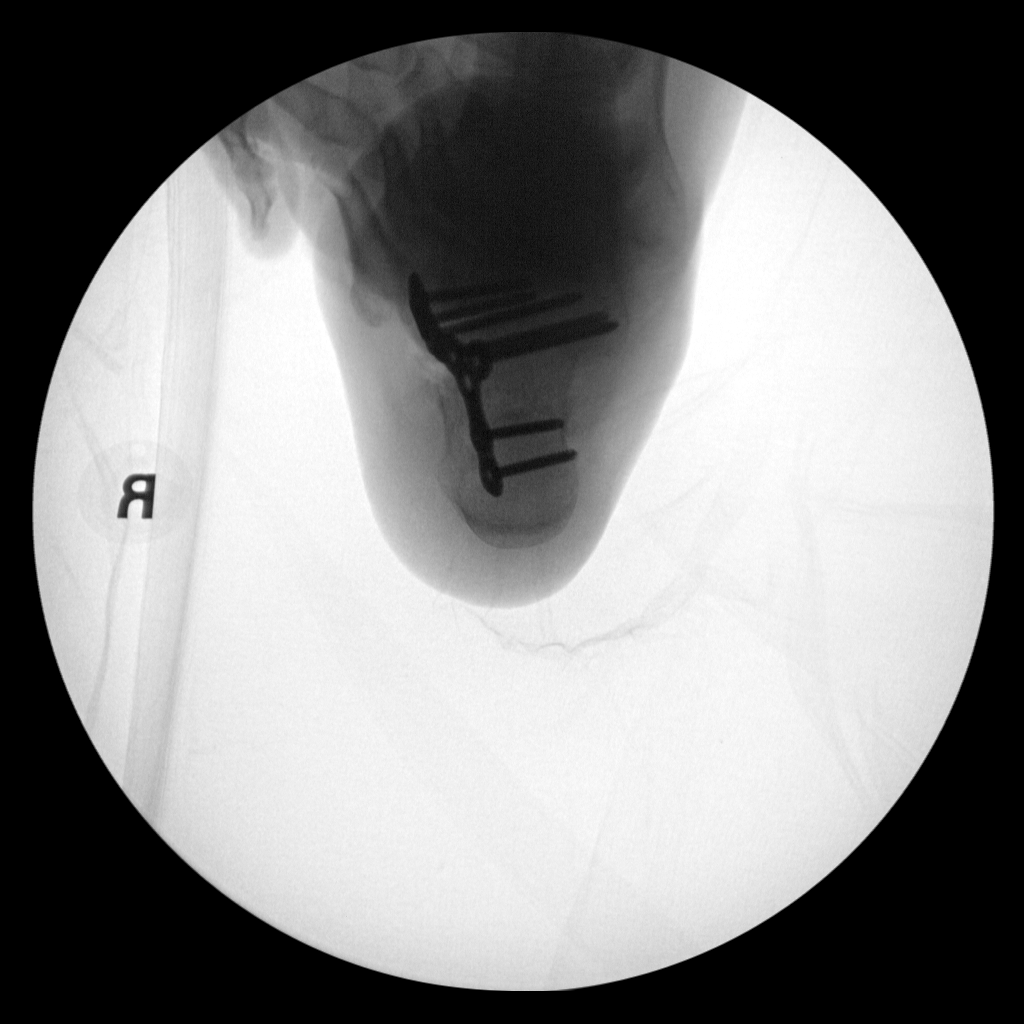

[3 of 3 positions shown; findings below may reference images not displayed]

FINDINGS: Multiple intraoperative fluoroscopic spot images are provided. Right
calcaneal fracture transfixed with a lateral sideplate and
interlocking screws.

FLUOROSCOPY TIME:  Fluoroscopy Time:  52 second

Radiation Exposure Index (if provided by the fluoroscopic device):
1.36 mGy

Number of Acquired Spot Images: 0
IMPRESSION: Interval right calcaneal ORIF.

## 2021-08-26 IMAGING — DX DG OS CALCIS 2+V*R*
2 series · 2 of 2 positions shown · non-contrast
Comparison: [DATE]

CLINICAL DATA: Right calcaneal ORIF

EXAM:
RIGHT OS CALCIS - 2+ VIEW

[calcaneus lat]
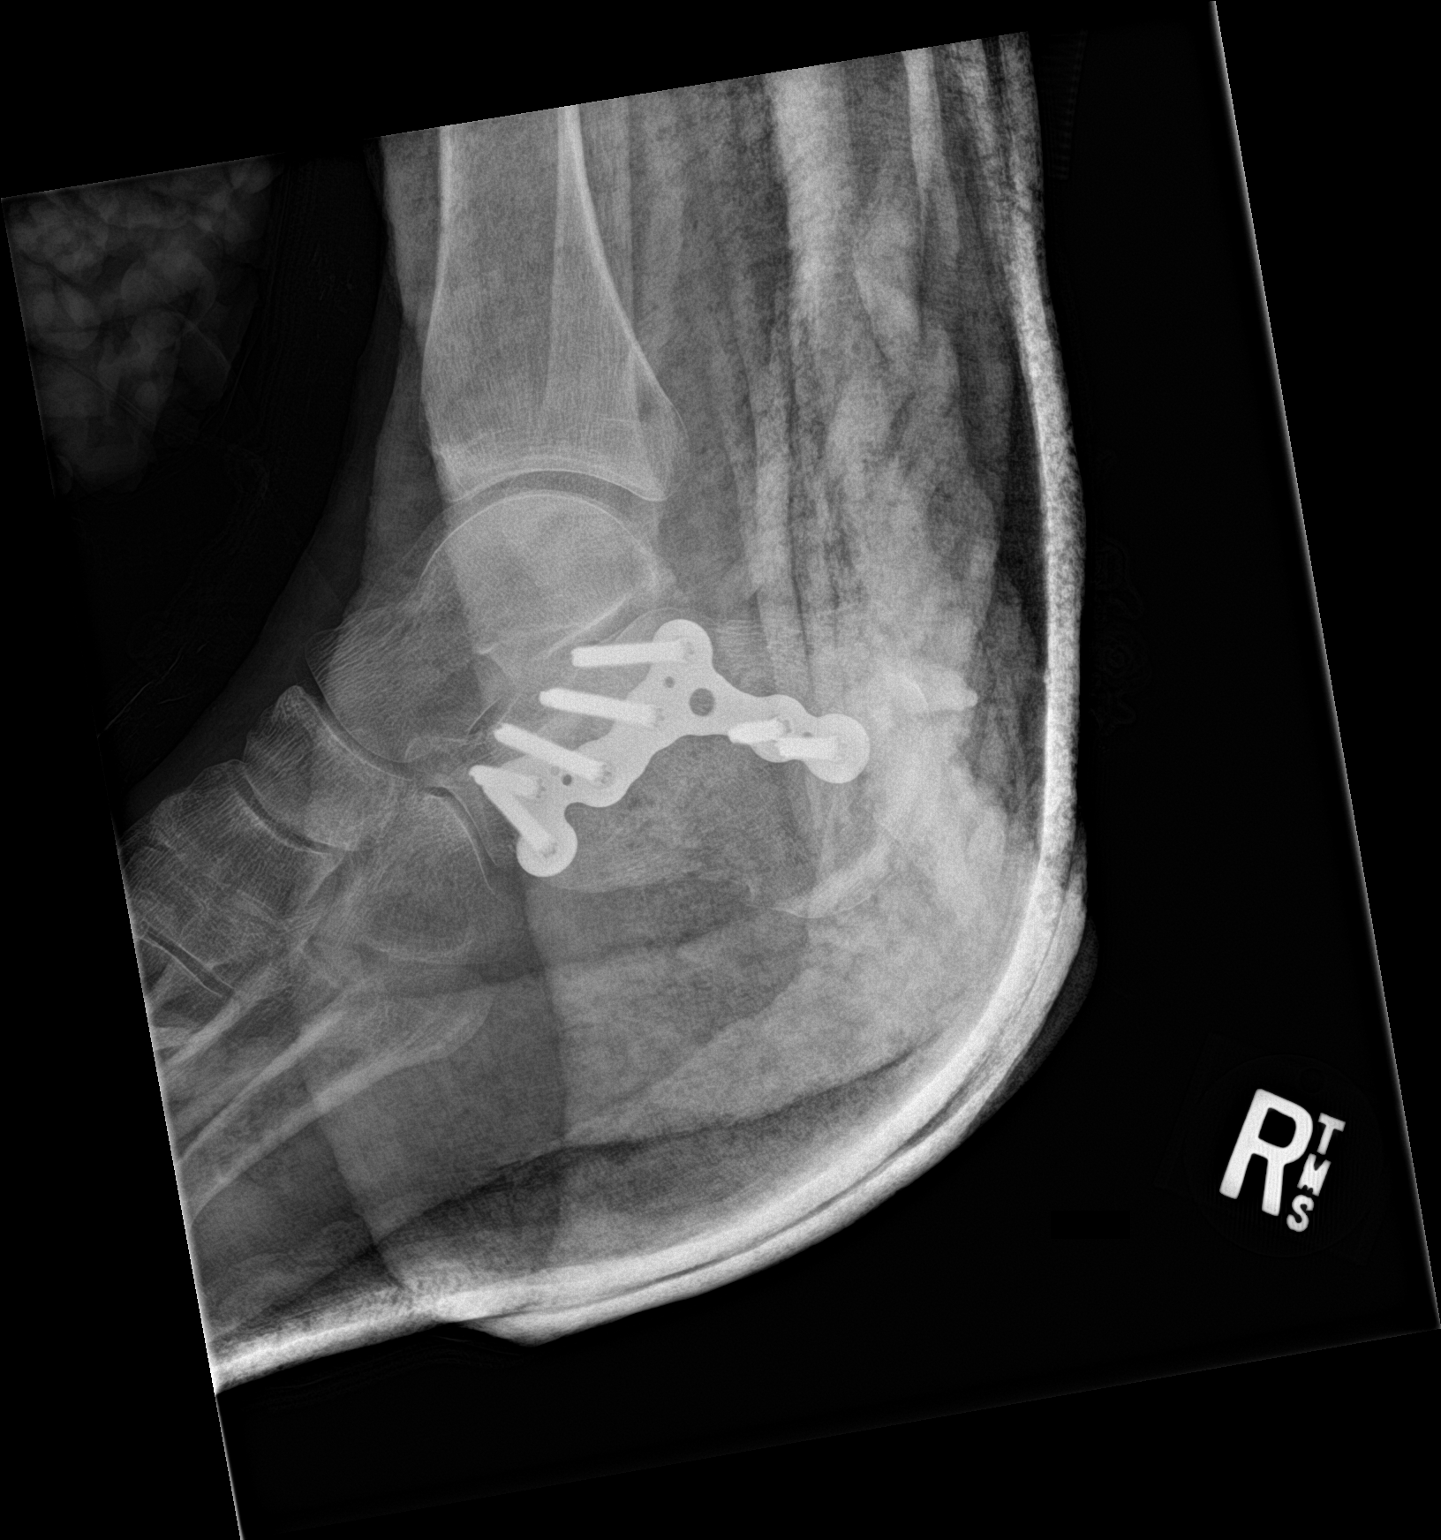

[calcaneus axial]
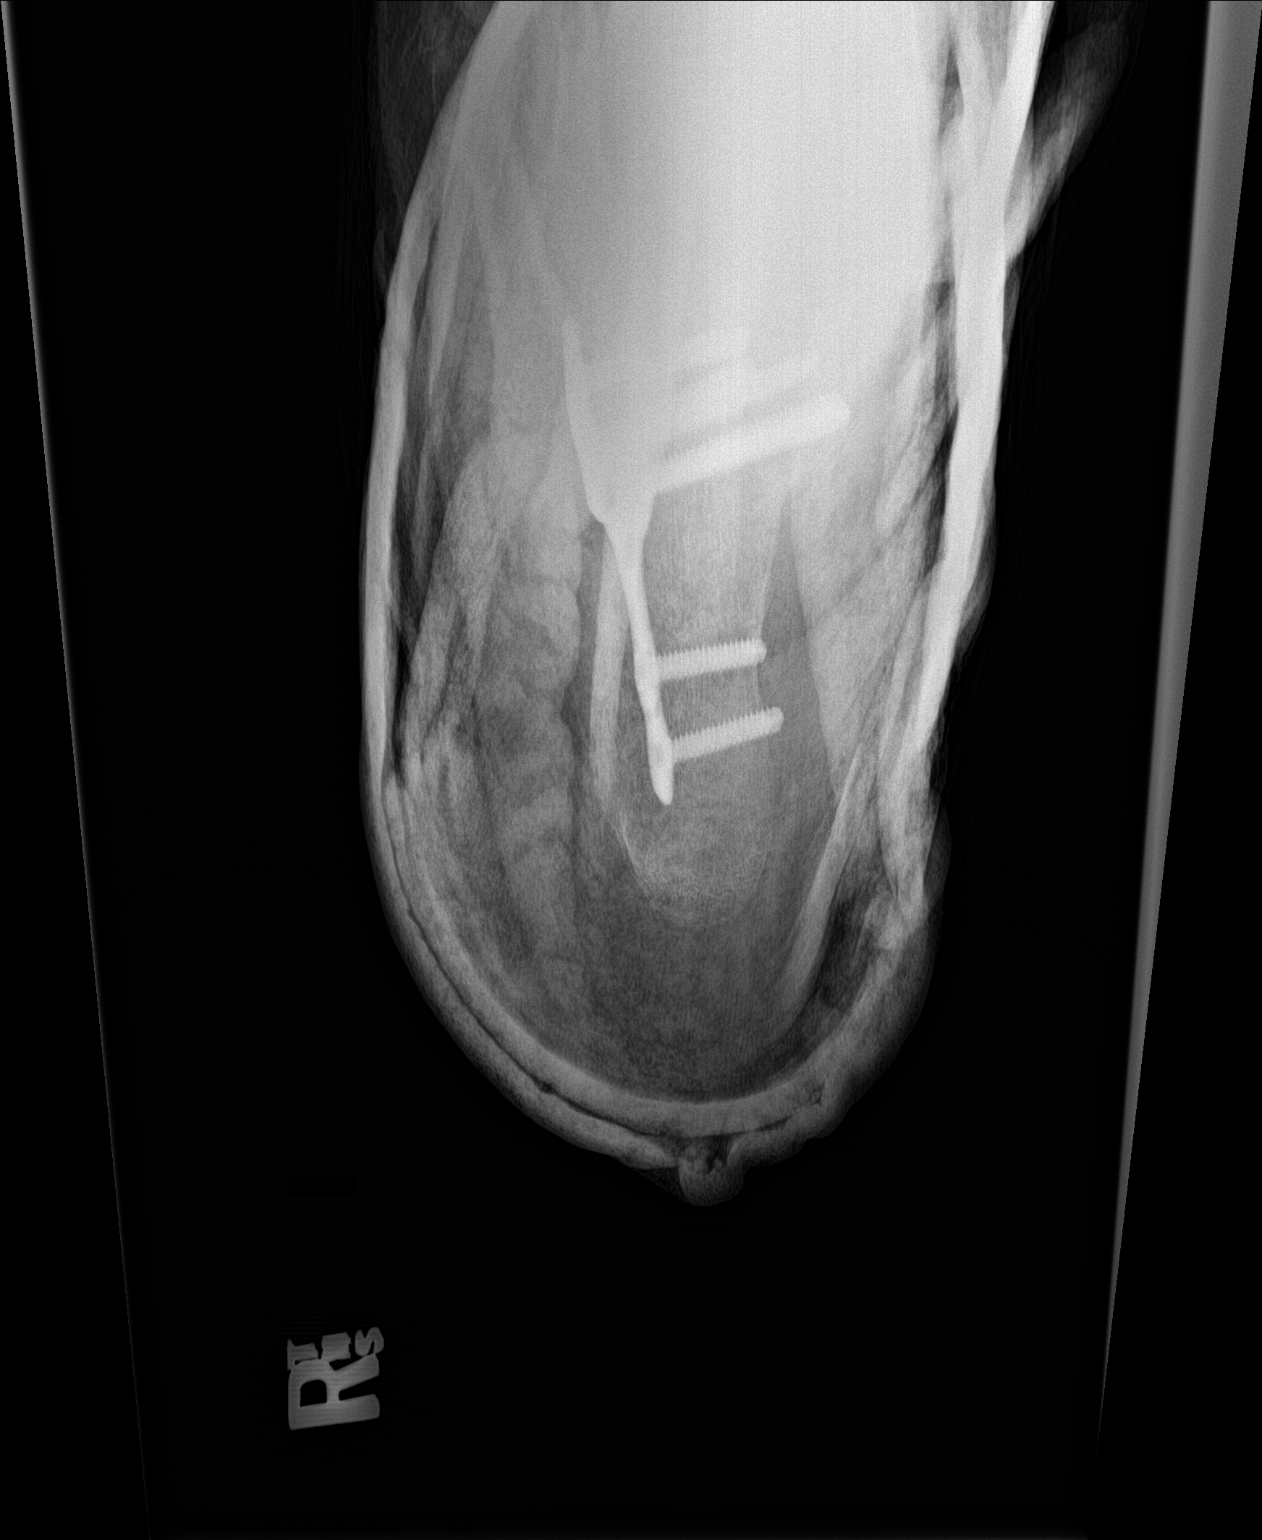

[2 of 2 positions shown; findings below may reference images not displayed]

FINDINGS: Overlying splint material degrades evaluation of osseous detail.
Interval ORIF of comminuted calcaneal fracture via lateral sideplate
and screw fixation construct. Alignment appears near-anatomic.
Subtalar and calcaneocuboid joint alignment appears maintained.
IMPRESSION: Interval ORIF of comminuted calcaneal fracture with near anatomic
alignment.

## 2021-08-26 SURGERY — OPEN REDUCTION INTERNAL FIXATION (ORIF) CALCANEOUS FRACTURE
Anesthesia: General | Site: Foot | Laterality: Right

## 2021-08-26 MED ORDER — ALBUTEROL SULFATE HFA 108 (90 BASE) MCG/ACT IN AERS
INHALATION_SPRAY | RESPIRATORY_TRACT | Status: DC | PRN
  Administered 2021-08-26: 6 via RESPIRATORY_TRACT

## 2021-08-26 MED ORDER — METOCLOPRAMIDE HCL 5 MG/ML IJ SOLN
5.0000 mg | Freq: Three times a day (TID) | INTRAMUSCULAR | Status: DC | PRN
  Administered 2021-09-01: 5 mg via INTRAVENOUS
  Administered 2021-09-02: 10 mg via INTRAVENOUS
  Filled 2021-08-26 (×2): qty 2

## 2021-08-26 MED ORDER — LACTATED RINGERS IV SOLN: INTRAVENOUS | Status: DC | PRN

## 2021-08-26 MED ORDER — MUPIROCIN 2 % EX OINT
1.0000 "application " | TOPICAL_OINTMENT | Freq: Two times a day (BID) | CUTANEOUS | Status: AC
  Administered 2021-08-26 – 2021-08-30 (×9): 1 via NASAL
  Filled 2021-08-26 (×3): qty 22

## 2021-08-26 MED ORDER — ALBUTEROL SULFATE (2.5 MG/3ML) 0.083% IN NEBU
INHALATION_SOLUTION | RESPIRATORY_TRACT | Status: AC
  Administered 2021-08-26: 2.5 mg
  Filled 2021-08-26: qty 3

## 2021-08-26 MED ORDER — DEXAMETHASONE SODIUM PHOSPHATE 10 MG/ML IJ SOLN
INTRAMUSCULAR | Status: AC
  Filled 2021-08-26: qty 1

## 2021-08-26 MED ORDER — 0.9 % SODIUM CHLORIDE (POUR BTL) OPTIME
TOPICAL | Status: DC | PRN
  Administered 2021-08-26: 1000 mL

## 2021-08-26 MED ORDER — VANCOMYCIN HCL 1000 MG IV SOLR
INTRAVENOUS | Status: AC
  Filled 2021-08-26: qty 20

## 2021-08-26 MED ORDER — IPRATROPIUM-ALBUTEROL 0.5-2.5 (3) MG/3ML IN SOLN: 3.0000 mL | Freq: Once | RESPIRATORY_TRACT | Status: DC

## 2021-08-26 MED ORDER — CEFAZOLIN SODIUM-DEXTROSE 2-4 GM/100ML-% IV SOLN
2.0000 g | Freq: Three times a day (TID) | INTRAVENOUS | Status: DC
  Administered 2021-08-26: 2 g via INTRAVENOUS
  Filled 2021-08-26 (×4): qty 100

## 2021-08-26 MED ORDER — HYDRALAZINE HCL 20 MG/ML IJ SOLN
INTRAMUSCULAR | Status: AC
  Filled 2021-08-26: qty 1

## 2021-08-26 MED ORDER — LIDOCAINE 2% (20 MG/ML) 5 ML SYRINGE
INTRAMUSCULAR | Status: AC
  Filled 2021-08-26: qty 5

## 2021-08-26 MED ORDER — HYDRALAZINE HCL 20 MG/ML IJ SOLN
10.0000 mg | INTRAMUSCULAR | Status: DC | PRN
  Administered 2021-08-26 (×3): 10 mg via INTRAVENOUS

## 2021-08-26 MED ORDER — PROPOFOL 10 MG/ML IV BOLUS
INTRAVENOUS | Status: AC
  Filled 2021-08-26: qty 20

## 2021-08-26 MED ORDER — ONDANSETRON HCL 4 MG PO TABS
4.0000 mg | ORAL_TABLET | Freq: Four times a day (QID) | ORAL | Status: DC | PRN
  Administered 2021-09-03 – 2021-09-04 (×2): 4 mg via ORAL
  Filled 2021-08-26 (×2): qty 1

## 2021-08-26 MED ORDER — METOCLOPRAMIDE HCL 5 MG PO TABS: 5.0000 mg | ORAL_TABLET | Freq: Three times a day (TID) | ORAL | Status: DC | PRN

## 2021-08-26 MED ORDER — BUPIVACAINE HCL (PF) 0.25 % IJ SOLN
INTRAMUSCULAR | Status: AC
  Filled 2021-08-26: qty 30

## 2021-08-26 MED ORDER — DOCUSATE SODIUM 100 MG PO CAPS
100.0000 mg | ORAL_CAPSULE | Freq: Two times a day (BID) | ORAL | Status: DC
  Administered 2021-08-26 – 2021-08-31 (×11): 100 mg via ORAL
  Filled 2021-08-26 (×11): qty 1

## 2021-08-26 MED ORDER — ROCURONIUM BROMIDE 10 MG/ML (PF) SYRINGE
PREFILLED_SYRINGE | INTRAVENOUS | Status: AC
  Filled 2021-08-26: qty 10

## 2021-08-26 MED ORDER — DROPERIDOL 2.5 MG/ML IJ SOLN: 0.6250 mg | Freq: Once | INTRAMUSCULAR | Status: DC | PRN

## 2021-08-26 MED ORDER — PROMETHAZINE HCL 25 MG/ML IJ SOLN: 6.2500 mg | INTRAMUSCULAR | Status: DC | PRN

## 2021-08-26 MED ORDER — CHLORHEXIDINE GLUCONATE CLOTH 2 % EX PADS
6.0000 | MEDICATED_PAD | Freq: Every day | CUTANEOUS | Status: AC
  Administered 2021-08-26 – 2021-08-30 (×5): 6 via TOPICAL

## 2021-08-26 MED ORDER — PHENYLEPHRINE 40 MCG/ML (10ML) SYRINGE FOR IV PUSH (FOR BLOOD PRESSURE SUPPORT)
PREFILLED_SYRINGE | INTRAVENOUS | Status: AC
  Filled 2021-08-26: qty 10

## 2021-08-26 MED ORDER — OXYCODONE HCL 5 MG/5ML PO SOLN: 5.0000 mg | Freq: Once | ORAL | Status: DC | PRN

## 2021-08-26 MED ORDER — ONDANSETRON HCL 4 MG/2ML IJ SOLN
INTRAMUSCULAR | Status: AC
  Filled 2021-08-26: qty 2

## 2021-08-26 MED ORDER — CEFAZOLIN SODIUM-DEXTROSE 2-4 GM/100ML-% IV SOLN
2.0000 g | Freq: Four times a day (QID) | INTRAVENOUS | Status: AC
  Administered 2021-08-27 (×2): 2 g via INTRAVENOUS
  Filled 2021-08-26 (×2): qty 100

## 2021-08-26 MED ORDER — KETOROLAC TROMETHAMINE 15 MG/ML IJ SOLN
15.0000 mg | Freq: Four times a day (QID) | INTRAMUSCULAR | Status: AC
  Administered 2021-08-26 – 2021-08-27 (×4): 15 mg via INTRAVENOUS
  Filled 2021-08-26 (×4): qty 1

## 2021-08-26 MED ORDER — LIDOCAINE 2% (20 MG/ML) 5 ML SYRINGE
INTRAMUSCULAR | Status: DC | PRN
  Administered 2021-08-26: 60 mg via INTRAVENOUS

## 2021-08-26 MED ORDER — FENTANYL CITRATE (PF) 250 MCG/5ML IJ SOLN
INTRAMUSCULAR | Status: AC
  Filled 2021-08-26: qty 5

## 2021-08-26 MED ORDER — ONDANSETRON HCL 4 MG/2ML IJ SOLN
4.0000 mg | Freq: Four times a day (QID) | INTRAMUSCULAR | Status: DC | PRN
  Administered 2021-09-02: 4 mg via INTRAVENOUS
  Filled 2021-08-26: qty 2

## 2021-08-26 MED ORDER — PHENYLEPHRINE 40 MCG/ML (10ML) SYRINGE FOR IV PUSH (FOR BLOOD PRESSURE SUPPORT)
PREFILLED_SYRINGE | INTRAVENOUS | Status: DC | PRN
  Administered 2021-08-26 (×2): 120 ug via INTRAVENOUS

## 2021-08-26 MED ORDER — ONDANSETRON HCL 4 MG/2ML IJ SOLN
INTRAMUSCULAR | Status: DC | PRN
  Administered 2021-08-26: 4 mg via INTRAVENOUS

## 2021-08-26 MED ORDER — ROCURONIUM BROMIDE 10 MG/ML (PF) SYRINGE
PREFILLED_SYRINGE | INTRAVENOUS | Status: DC | PRN
  Administered 2021-08-26: 70 mg via INTRAVENOUS

## 2021-08-26 MED ORDER — VANCOMYCIN HCL 1000 MG IV SOLR
INTRAVENOUS | Status: DC | PRN
  Administered 2021-08-26: 1000 mg via TOPICAL

## 2021-08-26 MED ORDER — DEXAMETHASONE SODIUM PHOSPHATE 10 MG/ML IJ SOLN
INTRAMUSCULAR | Status: DC | PRN
Start: 2021-08-26 — End: 2021-08-26
  Administered 2021-08-26: 8 mg via INTRAVENOUS

## 2021-08-26 MED ORDER — FENTANYL CITRATE (PF) 100 MCG/2ML IJ SOLN: 25.0000 ug | INTRAMUSCULAR | Status: DC | PRN

## 2021-08-26 MED ORDER — PROPOFOL 10 MG/ML IV BOLUS
INTRAVENOUS | Status: DC | PRN
  Administered 2021-08-26: 160 mg via INTRAVENOUS

## 2021-08-26 MED ORDER — SUGAMMADEX SODIUM 200 MG/2ML IV SOLN
INTRAVENOUS | Status: DC | PRN
  Administered 2021-08-26: 200 mg via INTRAVENOUS

## 2021-08-26 MED ORDER — FENTANYL CITRATE (PF) 250 MCG/5ML IJ SOLN
INTRAMUSCULAR | Status: DC | PRN
  Administered 2021-08-26: 50 ug via INTRAVENOUS
  Administered 2021-08-26: 100 ug via INTRAVENOUS
  Administered 2021-08-26: 50 ug via INTRAVENOUS
  Administered 2021-08-26: 100 ug via INTRAVENOUS

## 2021-08-26 MED ORDER — CHLORHEXIDINE GLUCONATE 0.12 % MT SOLN
OROMUCOSAL | Status: AC
  Administered 2021-08-26: 15 mL
  Filled 2021-08-26: qty 15

## 2021-08-26 MED ORDER — OXYCODONE HCL 5 MG PO TABS: 5.0000 mg | ORAL_TABLET | Freq: Once | ORAL | Status: DC | PRN

## 2021-08-26 MED ORDER — BACITRACIN ZINC 500 UNIT/GM EX OINT
TOPICAL_OINTMENT | CUTANEOUS | Status: AC
  Filled 2021-08-26: qty 28.35

## 2021-08-26 MED ORDER — MIDAZOLAM HCL 2 MG/2ML IJ SOLN
INTRAMUSCULAR | Status: AC
  Filled 2021-08-26: qty 2

## 2021-08-26 SURGICAL SUPPLY — 77 items
APL PRP STRL LF DISP 70% ISPRP (MISCELLANEOUS) ×2
BAG COUNTER SPONGE SURGICOUNT (BAG) ×3 IMPLANT
BAG SPNG CNTER NS LX DISP (BAG) ×2
BAG SURGICOUNT SPONGE COUNTING (BAG) ×1
BIT DRILL CALIBRATED 2.7 (BIT) ×2 IMPLANT
BIT DRILL CALIBRATED 2.7MM (BIT) ×1
BLADE SURG 10 STRL SS (BLADE) ×4 IMPLANT
BNDG COHESIVE 4X5 TAN STRL (GAUZE/BANDAGES/DRESSINGS) ×4 IMPLANT
BNDG ELASTIC 3X5.8 VLCR STR LF (GAUZE/BANDAGES/DRESSINGS) ×4 IMPLANT
BNDG ELASTIC 4X5.8 VLCR STR LF (GAUZE/BANDAGES/DRESSINGS) ×4 IMPLANT
BNDG ELASTIC 6X5.8 VLCR STR LF (GAUZE/BANDAGES/DRESSINGS) ×4 IMPLANT
BNDG GAUZE ELAST 4 BULKY (GAUZE/BANDAGES/DRESSINGS) ×1 IMPLANT
BRUSH SCRUB EZ PLAIN DRY (MISCELLANEOUS) ×5 IMPLANT
CHLORAPREP W/TINT 26 (MISCELLANEOUS) ×4 IMPLANT
CONNECTOR 5 IN 1 STRAIGHT STRL (MISCELLANEOUS) ×4 IMPLANT
COVER MAYO STAND STRL (DRAPES) ×4 IMPLANT
COVER SURGICAL LIGHT HANDLE (MISCELLANEOUS) ×5 IMPLANT
CUFF TOURN SGL QUICK 18X4 (TOURNIQUET CUFF) IMPLANT
CUFF TOURN SGL QUICK 24 (TOURNIQUET CUFF) ×4
CUFF TRNQT CYL 24X4X16.5-23 (TOURNIQUET CUFF) ×1 IMPLANT
DRAPE C-ARM 35X43 STRL (DRAPES) ×3 IMPLANT
DRAPE C-ARM 42X72 X-RAY (DRAPES) ×4 IMPLANT
DRAPE C-ARMOR (DRAPES) ×4 IMPLANT
DRAPE INCISE IOBAN 66X45 STRL (DRAPES) ×4 IMPLANT
DRAPE ORTHO SPLIT 77X108 STRL (DRAPES) ×8
DRAPE SURG ORHT 6 SPLT 77X108 (DRAPES) ×4 IMPLANT
DRAPE U-SHAPE 47X51 STRL (DRAPES) ×4 IMPLANT
DRSG MEPITEL 4X7.2 (GAUZE/BANDAGES/DRESSINGS) ×3 IMPLANT
ELECT REM PT RETURN 9FT ADLT (ELECTROSURGICAL) ×4
ELECTRODE REM PT RTRN 9FT ADLT (ELECTROSURGICAL) ×2 IMPLANT
GAUZE SPONGE 4X4 12PLY STRL (GAUZE/BANDAGES/DRESSINGS) ×4 IMPLANT
GLOVE SURG ENC MOIS LTX SZ6.5 (GLOVE) ×12 IMPLANT
GLOVE SURG ENC MOIS LTX SZ7.5 (GLOVE) ×16 IMPLANT
GLOVE SURG UNDER POLY LF SZ6.5 (GLOVE) ×4 IMPLANT
GLOVE SURG UNDER POLY LF SZ7 (GLOVE) ×4 IMPLANT
GLOVE SURG UNDER POLY LF SZ7.5 (GLOVE) ×4 IMPLANT
GOWN STRL REUS W/ TWL LRG LVL3 (GOWN DISPOSABLE) ×4 IMPLANT
GOWN STRL REUS W/TWL LRG LVL3 (GOWN DISPOSABLE) ×8
K-WIRE ACE 1.6X6 (WIRE) ×8
KIT BASIN OR (CUSTOM PROCEDURE TRAY) ×4 IMPLANT
KIT TURNOVER KIT B (KITS) ×4 IMPLANT
KWIRE ACE 1.6X6 (WIRE) ×2 IMPLANT
MANIFOLD NEPTUNE II (INSTRUMENTS) ×4 IMPLANT
NDL HYPO 21X1.5 SAFETY (NEEDLE) IMPLANT
NEEDLE 22X1 1/2 (OR ONLY) (NEEDLE) ×3 IMPLANT
NEEDLE HYPO 21X1.5 SAFETY (NEEDLE) ×4 IMPLANT
NS IRRIG 1000ML POUR BTL (IV SOLUTION) ×4 IMPLANT
PACK ORTHO EXTREMITY (CUSTOM PROCEDURE TRAY) ×4 IMPLANT
PAD ARMBOARD 7.5X6 YLW CONV (MISCELLANEOUS) ×8 IMPLANT
PAD CAST 4YDX4 CTTN HI CHSV (CAST SUPPLIES) ×4 IMPLANT
PADDING CAST COTTON 4X4 STRL (CAST SUPPLIES) ×8
PADDING CAST COTTON 6X4 STRL (CAST SUPPLIES) ×7 IMPLANT
PLATE CALC MIS EXTEND SM RT (Plate) ×3 IMPLANT
SCREW LOCK CORT STAR 3.5X22 (Screw) ×3 IMPLANT
SCREW LOCK CORT STAR 3.5X26 (Screw) ×3 IMPLANT
SCREW LOCK CORT STAR 3.5X28 (Screw) ×6 IMPLANT
SCREW LOCK CORT STAR 3.5X38 (Screw) ×6 IMPLANT
SCREW LOCK CORT STAR 3.5X40 (Screw) ×3 IMPLANT
SCREW LP NL T15 3.5X26 (Screw) ×3 IMPLANT
SCREW SHANZ 4.0X60MM (EXFIX) IMPLANT
SCREW T15 LP CORT 3.5X46MM NS (Screw) ×3 IMPLANT
SPONGE T-LAP 18X18 ~~LOC~~+RFID (SPONGE) ×6 IMPLANT
SUCTION FRAZIER HANDLE 10FR (MISCELLANEOUS) ×12
SUCTION TUBE FRAZIER 10FR DISP (MISCELLANEOUS) ×4 IMPLANT
SUT ETHILON 3 0 PS 1 (SUTURE) ×8 IMPLANT
SUT MNCRL AB 3-0 PS2 27 (SUTURE) ×3 IMPLANT
SUT VIC AB 0 CT1 27 (SUTURE) ×4
SUT VIC AB 0 CT1 27XBRD ANBCTR (SUTURE) ×2 IMPLANT
SUT VIC AB 2-0 CT1 27 (SUTURE)
SUT VIC AB 2-0 CT1 TAPERPNT 27 (SUTURE) IMPLANT
SYR CONTROL 10ML LL (SYRINGE) ×4 IMPLANT
TOWEL GREEN STERILE (TOWEL DISPOSABLE) ×8 IMPLANT
TOWEL GREEN STERILE FF (TOWEL DISPOSABLE) ×4 IMPLANT
TUBE CONNECTING 12'X1/4 (SUCTIONS) ×2
TUBE CONNECTING 12X1/4 (SUCTIONS) ×6 IMPLANT
UNDERPAD 30X36 HEAVY ABSORB (UNDERPADS AND DIAPERS) ×4 IMPLANT
WATER STERILE IRR 1000ML POUR (IV SOLUTION) ×4 IMPLANT

## 2021-08-26 NOTE — Anesthesia Procedure Notes (Signed)
Procedure Name: Intubation Date/Time: 08/26/2021 8:05 AM Performed by: Inda Coke, CRNA Pre-anesthesia Checklist: Patient identified, Emergency Drugs available, Suction available and Patient being monitored Patient Re-evaluated:Patient Re-evaluated prior to induction Oxygen Delivery Method: Circle System Utilized Preoxygenation: Pre-oxygenation with 100% oxygen Induction Type: IV induction Ventilation: Mask ventilation without difficulty Laryngoscope Size: Mac and 3 Grade View: Grade I Tube type: Oral Tube size: 7.0 mm Number of attempts: 1 Airway Equipment and Method: Stylet and Oral airway Placement Confirmation: ETT inserted through vocal cords under direct vision, positive ETCO2 and breath sounds checked- equal and bilateral Secured at: 22 cm Tube secured with: Tape Dental Injury: Teeth and Oropharynx as per pre-operative assessment  Comments: Intubation performed by Abby paramedic student

## 2021-08-26 NOTE — Progress Notes (Addendum)
PT Cancellation Note  Patient Details Name: Brittany Hobbs MRN: [id number] DOB: 06-19-1961   Cancelled Treatment:    Reason Eval/Treat Not Completed: Patient at procedure or test/unavailable. Pt in OR for ORIF R calcaneal fx.  Addendum 1158: Pt just returned to room from OR. Groggy/lethargic. Unable to participate in PT eval at this time. Will re-attempt as time allows.   Lorriane Shire 08/26/2021, 7:08 AM  Lorrin Goodell, PT  Office # [phone] Pager [phone]
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
## 2021-08-26 NOTE — Plan of Care (Signed)

## 2021-08-26 NOTE — Progress Notes (Signed)
Unable to obtain consent form signed by MD pt AOX! MD informed

## 2021-08-26 NOTE — Anesthesia Procedure Notes (Signed)
Arterial Line Insertion Start/End12/05/2021 9:20 AM, 08/26/2021 9:25 AM Performed by: Merlinda Frederick, MD, anesthesiologist  Patient location: OR. Preanesthetic checklist: patient identified, risks and benefits discussed and surgical consent Lidocaine 1% used for infiltration Left, radial was placed Catheter size: 20 G Hand hygiene performed  and Seldinger technique used  Attempts: 1 Procedure performed without using ultrasound guided technique. Following insertion, dressing applied and Biopatch. Post procedure assessment: normal  Patient tolerated the procedure well with no immediate complications.

## 2021-08-26 NOTE — Evaluation (Signed)
Pt sat running in the low 80s. Pt refuses simple mask. Md bass at bedside. Md encouraged pt to let rn put facemask at 10 l on the pt.  Pt now satting 99%. Duoneb also ordered. Rn will continue to monitor. 1030 pt sbp running in the 200s. Md bass called. Orders received.

## 2021-08-26 NOTE — Progress Notes (Signed)
OT Cancellation Note  Patient Details Name: Brittany Hobbs MRN: [id number] DOB: November 12, 1960   Cancelled Treatment:    Reason Eval/Treat Not Completed: Patient at procedure or test/ unavailable (Surgery. Will return as schedule allows.)  [place], OTR/L Acute Rehab Pager: [phone] Office: [phone] 08/26/2021, 7:09 AM

## 2021-08-26 NOTE — Interval H&P Note (Signed)
History and Physical Interval Note:  08/26/2021 7:45 AM  Brittany Hobbs  has presented today for surgery, with the diagnosis of closed fracture distal left ulna.  The various methods of treatment have been discussed with the patient and family. After consideration of risks, benefits and other options for treatment, the patient has consented to  Procedure(s): OPEN REDUCTION INTERNAL FIXATION (ORIF) ULNAR FRACTURE (Left) OPEN REDUCTION INTERNAL FIXATION (ORIF) CALCANEOUS FRACTURE (Right) as a surgical intervention.  The patient's history has been reviewed, patient examined, no change in status, stable for surgery.  I have reviewed the patient's chart and labs.  Questions were answered to the patient's satisfaction.     Lennette Bihari P [place]

## 2021-08-26 NOTE — Transfer of Care (Signed)
Immediate Anesthesia Transfer of Care Note  Patient: Brittany Hobbs  Procedure(s) Performed: OPEN REDUCTION INTERNAL FIXATION (ORIF) CALCANEOUS FRACTURE (Right: Foot)  Patient Location: PACU  Anesthesia Type:General  Level of Consciousness: awake and alert   Airway & Oxygen Therapy: Patient Spontanous Breathing and Patient connected to face mask oxygen  Post-op Assessment: Report given to RN and Post -op Vital signs reviewed and stable  Post vital signs: Reviewed and stable  Last Vitals:  Vitals Value Taken Time  BP 152/63 08/26/21 0941  Temp    Pulse 86 08/26/21 0945  Resp 16 08/26/21 0945  SpO2 96 % 08/26/21 0945  Vitals shown include unvalidated device data.  Last Pain:  Vitals:   08/26/21 0400  TempSrc: Oral  PainSc:          Complications: No notable events documented.

## 2021-08-27 ENCOUNTER — Inpatient Hospital Stay (HOSPITAL_COMMUNITY): Payer: BC Managed Care – PPO

## 2021-08-27 LAB — BASIC METABOLIC PANEL
Anion gap: 10 (ref 5–15)
BUN: 17 mg/dL (ref 6–20)
CO2: 25 mmol/L (ref 22–32)
Calcium: 8.5 mg/dL — ABNORMAL LOW (ref 8.9–10.3)
Chloride: 102 mmol/L (ref 98–111)
Creatinine, Ser: 0.62 mg/dL (ref 0.44–1.00)
GFR, Estimated: >60 mL/min (ref >60)
Glucose, Bld: 89 mg/dL (ref 70–99)
Potassium: 4.3 mmol/L (ref 3.5–5.1)
Sodium: 137 mmol/L (ref 135–145)

## 2021-08-27 LAB — RAPID HIV SCREEN (HIV 1/2 AB+AG)
HIV 1/2 Antibodies: NONREACTIVE
HIV-1 P24 Antigen - HIV24: NONREACTIVE

## 2021-08-27 LAB — FOLATE: Folate: 6.5 ng/mL (ref >5.9)

## 2021-08-27 LAB — CBC
HCT: 36.5 % (ref 36.0–46.0)
Hemoglobin: 11.3 g/dL — ABNORMAL LOW (ref 12.0–15.0)
MCH: 26.3 pg (ref 26.0–34.0)
MCHC: 31 g/dL (ref 30.0–36.0)
MCV: 85.1 fL (ref 80.0–100.0)
Platelets: 381 10*3/uL (ref 150–400)
RBC: 4.29 MIL/uL (ref 3.87–5.11)
RDW: 16 % — ABNORMAL HIGH (ref 11.5–15.5)
WBC: 14 10*3/uL — ABNORMAL HIGH (ref 4.0–10.5)
nRBC: 0 % (ref 0.0–0.2)

## 2021-08-27 LAB — AMMONIA: Ammonia: 21 umol/L (ref 9–35)

## 2021-08-27 MED ORDER — VITAMIN D 25 MCG (1000 UNIT) PO TABS
5000.0000 [IU] | ORAL_TABLET | Freq: Every day | ORAL | Status: DC
  Administered 2021-08-27 – 2021-09-05 (×10): 5000 [IU] via ORAL
  Filled 2021-08-27 (×10): qty 5

## 2021-08-27 MED ORDER — GUAIFENESIN-DM 100-10 MG/5ML PO SYRP
5.0000 mL | ORAL_SOLUTION | ORAL | Status: DC | PRN
  Administered 2021-08-27 – 2021-09-01 (×7): 5 mL via ORAL
  Filled 2021-08-27 (×8): qty 5
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
## 2021-08-27 NOTE — Progress Notes (Signed)
EEG complete - results pending 

## 2021-08-28 DIAGNOSIS — R413 Other amnesia: Secondary | ICD-10-CM

## 2021-08-28 DIAGNOSIS — R4182 Altered mental status, unspecified: Secondary | ICD-10-CM

## 2021-08-28 LAB — BASIC METABOLIC PANEL
Anion gap: 6 (ref 5–15)
BUN: 15 mg/dL (ref 6–20)
CO2: 26 mmol/L (ref 22–32)
Calcium: 8.2 mg/dL — ABNORMAL LOW (ref 8.9–10.3)
Chloride: 105 mmol/L (ref 98–111)
Creatinine, Ser: 0.64 mg/dL (ref 0.44–1.00)
GFR, Estimated: >60 mL/min (ref >60)
Glucose, Bld: 137 mg/dL — ABNORMAL HIGH (ref 70–99)
Potassium: 3.6 mmol/L (ref 3.5–5.1)
Sodium: 137 mmol/L (ref 135–145)

## 2021-08-28 LAB — RPR: RPR Ser Ql: NONREACTIVE

## 2021-08-28 LAB — CBC
HCT: 31.9 % — ABNORMAL LOW (ref 36.0–46.0)
Hemoglobin: 9.9 g/dL — ABNORMAL LOW (ref 12.0–15.0)
MCH: 26.7 pg (ref 26.0–34.0)
MCHC: 31 g/dL (ref 30.0–36.0)
MCV: 86 fL (ref 80.0–100.0)
Platelets: 285 10*3/uL (ref 150–400)
RBC: 3.71 MIL/uL — ABNORMAL LOW (ref 3.87–5.11)
RDW: 16.1 % — ABNORMAL HIGH (ref 11.5–15.5)
WBC: 9.8 10*3/uL (ref 4.0–10.5)
nRBC: 0 % (ref 0.0–0.2)

## 2021-08-28 LAB — TSH: TSH: 5.194 u[IU]/mL — ABNORMAL HIGH (ref 0.350–4.500)

## 2021-08-28 MED ORDER — CYANOCOBALAMIN 1000 MCG/ML IJ SOLN
1000.0000 ug | Freq: Once | INTRAMUSCULAR | Status: AC
  Administered 2021-08-29: 1000 ug via INTRAMUSCULAR
  Filled 2021-08-28: qty 1

## 2021-08-28 MED ORDER — ALBUTEROL SULFATE HFA 108 (90 BASE) MCG/ACT IN AERS: 2.0000 | INHALATION_SPRAY | Freq: Four times a day (QID) | RESPIRATORY_TRACT | Status: DC | PRN
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
## 2021-08-28 NOTE — Progress Notes (Addendum)
Patient has active restraint order but patient's restraints was discontinued before the writer's shift. At this time not sure when restraints was physically d/c on the patient.
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
## 2021-08-28 NOTE — Plan of Care (Signed)
  Problem: Health Behavior/Discharge Planning: Goal: Ability to manage health-related needs will improve Outcome: Progressing   

## 2021-08-28 NOTE — Anesthesia Postprocedure Evaluation (Signed)
Anesthesia Post Note  Patient: Brittany Hobbs  Procedure(s) Performed: OPEN REDUCTION INTERNAL FIXATION (ORIF) CALCANEOUS FRACTURE (Right: Foot)     Patient location during evaluation: PACU Anesthesia Type: General Level of consciousness: awake and alert Pain management: pain level controlled Vital Signs Assessment: post-procedure vital signs reviewed and stable Respiratory status: spontaneous breathing, nonlabored ventilation, respiratory function stable and patient connected to nasal cannula oxygen Cardiovascular status: blood pressure returned to baseline and stable Postop Assessment: no apparent nausea or vomiting Anesthetic complications: no   No notable events documented.  Last Vitals:  Vitals:   08/27/21 2341 08/28/21 0358  BP: 120/69 133/89  Pulse: 72 70  Resp: 18 17  Temp: 36.8 C 36.8 C  SpO2: 94%     Last Pain:  Vitals:   08/27/21 2341  TempSrc: Oral  PainSc:    Pain Goal: Patients Stated Pain Goal: 0 (08/26/21 1457)                 Merlinda Frederick
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
## 2021-08-28 NOTE — Progress Notes (Signed)
Brief Neuro Update and signoff note:  Routine EEG with no seizures or epileptiform discharges.   - Recommend continue Cyanocobalamin 1070mcg PO daily at discharge and recheck Vitamin B12 levels in a month.   - Would also recommend starting her on multivitamin with multimineral given there is a good chance that she is going to be deficient in other vitamins also.  - Follow up with neurology outpatient. We will signoff. Please feel free to contact us with any questions or concerns.  [place] Pager Number [phone]

## 2021-08-29 ENCOUNTER — Encounter (HOSPITAL_COMMUNITY): Payer: Self-pay | Admitting: Student

## 2021-08-29 LAB — BASIC METABOLIC PANEL
Anion gap: 7 (ref 5–15)
BUN: 9 mg/dL (ref 6–20)
CO2: 28 mmol/L (ref 22–32)
Calcium: 8.6 mg/dL — ABNORMAL LOW (ref 8.9–10.3)
Chloride: 103 mmol/L (ref 98–111)
Creatinine, Ser: 0.52 mg/dL (ref 0.44–1.00)
GFR, Estimated: >60 mL/min (ref >60)
Glucose, Bld: 97 mg/dL (ref 70–99)
Potassium: 3.8 mmol/L (ref 3.5–5.1)
Sodium: 138 mmol/L (ref 135–145)

## 2021-08-29 MED ORDER — VITAMIN B-12 1000 MCG PO TABS
500.0000 ug | ORAL_TABLET | Freq: Every day | ORAL | Status: DC
  Administered 2021-08-29 – 2021-09-05 (×8): 500 ug via ORAL
  Filled 2021-08-29 (×8): qty 1
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
## 2021-08-29 NOTE — Progress Notes (Signed)
SLP Cancellation Note  Patient Details Name: Brittany Hobbs MRN: [id number] DOB: 06/14/1961   Cancelled treatment:        Attempted to see pt for cognitive-linguistic evaluation.  Pt asleep on arrive but easily roused and agreeable to assessment.  Pt unable to maintain wakefulness and falling asleep during orientation questions.  SLP will hold evaluation until pt is able to participate.    Celedonio Savage, [place], [place] Office: [phone]  08/29/2021, 10:33 AM
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
## 2021-08-29 NOTE — Discharge Instructions (Addendum)
Orthopaedic Trauma Service Discharge Instructions   General Discharge Instructions  WEIGHT BEARING STATUS: Right leg - Non-weightbearing in splint Left arm - OK for weightbearing through elbow, non-weightbearing through wrist  RANGE OF MOTION/ACTIVITY: Right leg - Keep splint in place at all time Left arm - Keep splint in place unless bathing  Wound Care:You may shower, cover right leg splint covered. You may remove left arm splint for bathing.   DVT/PE prophylaxis: Aspirin 81 mg twice daily x 30 days  Diet: as you were eating previously.  Can use over the counter stool softeners and bowel preparations, such as Miralax, to help with bowel movements.  Narcotics can be constipating.  Be sure to drink plenty of fluids  PAIN MEDICATION USE AND EXPECTATIONS  You have likely been given narcotic medications to help control your pain.  After a traumatic event that results in an fracture (broken bone) with or without surgery, it is ok to use narcotic pain medications to help control one's pain.  We understand that everyone responds to pain differently and each individual patient will be evaluated on a regular basis for the continued need for narcotic medications. Ideally, narcotic medication use should last no more than 6-8 weeks (coinciding with fracture healing).   As a patient it is your responsibility as well to monitor narcotic medication use and report the amount and frequency you use these medications when you come to your office visit.   We would also advise that if you are using narcotic medications, you should take a dose prior to therapy to maximize you participation.  IF YOU ARE ON NARCOTIC MEDICATIONS IT IS NOT PERMISSIBLE TO OPERATE A MOTOR VEHICLE (MOTORCYCLE/CAR/TRUCK/MOPED) OR HEAVY MACHINERY DO NOT MIX NARCOTICS WITH OTHER CNS (CENTRAL NERVOUS SYSTEM) DEPRESSANTS SUCH AS ALCOHOL   STOP SMOKING OR USING NICOTINE PRODUCTS!!!!  As discussed nicotine severely impairs your body's  ability to heal surgical and traumatic wounds but also impairs bone healing.  Wounds and bone heal by forming microscopic blood vessels (angiogenesis) and nicotine is a vasoconstrictor (essentially, shrinks blood vessels).  Therefore, if vasoconstriction occurs to these microscopic blood vessels they essentially disappear and are unable to deliver necessary nutrients to the healing tissue.  This is one modifiable factor that you can do to dramatically increase your chances of healing your injury.    (This means no smoking, no nicotine gum, patches, etc)  DO NOT USE NONSTEROIDAL ANTI-INFLAMMATORY DRUGS (NSAID'S)  Using products such as Advil (ibuprofen), Aleve (naproxen), Motrin (ibuprofen) for additional pain control during fracture healing can delay and/or prevent the healing response.  If you would like to take over the counter (OTC) medication, Tylenol (acetaminophen) is ok.  However, some narcotic medications that are given for pain control contain acetaminophen as well. Therefore, you should not exceed more than 4000 mg of tylenol in a day if you do not have liver disease.  Also note that there are may OTC medicines, such as cold medicines and allergy medicines that my contain tylenol as well.  If you have any questions about medications and/or interactions please ask your doctor/PA or your pharmacist.      ICE AND ELEVATE INJURED/OPERATIVE EXTREMITY  Using ice and elevating the injured extremity above your heart can help with swelling and pain control.  Icing in a pulsatile fashion, such as 20 minutes on and 20 minutes off, can be followed.    Do not place ice directly on skin. Make sure there is a barrier between to skin and  the ice pack.    Using frozen items such as frozen peas works well as the conform nicely to the are that needs to be iced.  USE AN ACE WRAP OR TED HOSE FOR SWELLING CONTROL  In addition to icing and elevation, Ace wraps or TED hose are used to help limit and resolve swelling.   It is recommended to use Ace wraps or TED hose until you are informed to stop.    When using Ace Wraps start the wrapping distally (farthest away from the body) and wrap proximally (closer to the body)   Example: If you had surgery on your leg or thing and you do not have a splint on, start the ace wrap at the toes and work your way up to the thigh        If you had surgery on your upper extremity and do not have a splint on, start the ace wrap at your fingers and work your way up to the upper arm  IF YOU ARE IN A SPLINT OR CAST DO NOT [place]   If your splint gets wet for any reason please contact the office immediately. You may shower in your splint or cast as long as you keep it dry.  This can be done by wrapping in a cast cover or garbage back (or similar)  Do Not stick any thing down your splint or cast such as pencils, money, or hangers to try and scratch yourself with.  If you feel itchy take benadryl as prescribed on the bottle for itching   CALL THE OFFICE WITH ANY QUESTIONS OR CONCERNS: [phone]   VISIT OUR WEBSITE FOR ADDITIONAL INFORMATION: [email or web address]    Discharge Wound Care Instructions  Do NOT apply any ointments, solutions or lotions to pin sites or surgical wounds.  These prevent needed drainage and even though solutions like hydrogen peroxide kill bacteria, they also damage cells lining the pin sites that help fight infection.  Applying lotions or ointments can keep the wounds moist and can cause them to breakdown and open up as well. This can increase the risk for infection. When in doubt call the office.  Once the incision is completely dry and without drainage, it may be left open to air out.  Showering may begin 36-48 hours later.  Cleaning gently with soap and water.  Traumatic wounds should be dressed daily as well.    One layer of adaptic, gauze, Kerlix, then ace wrap.  The adaptic can be discontinued once the draining has ceased    If you  have a wet to dry dressing: wet the gauze with saline the squeeze as much saline out so the gauze is moist (not soaking wet), place moistened gauze over wound, then place a dry gauze over the moist one, followed by Kerlix wrap, then ace wrap.
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
## 2021-08-30 DIAGNOSIS — R419 Unspecified symptoms and signs involving cognitive functions and awareness: Secondary | ICD-10-CM

## 2021-08-30 DIAGNOSIS — F419 Anxiety disorder, unspecified: Secondary | ICD-10-CM

## 2021-08-30 LAB — METHYLMALONIC ACID, SERUM: Methylmalonic Acid, Quantitative: 222 nmol/L (ref 0–378)

## 2021-08-30 LAB — VITAMIN B1: Vitamin B1 (Thiamine): 95.3 nmol/L (ref 66.5–200.0)

## 2021-08-30 MED ORDER — AMPHETAMINE-DEXTROAMPHETAMINE 10 MG PO TABS
15.0000 mg | ORAL_TABLET | Freq: Every day | ORAL | Status: DC
  Administered 2021-08-31 – 2021-09-05 (×6): 15 mg via ORAL
  Filled 2021-08-30 (×6): qty 2

## 2021-08-30 MED ORDER — MORPHINE SULFATE (PF) 2 MG/ML IV SOLN
1.0000 mg | INTRAVENOUS | Status: DC | PRN
  Administered 2021-08-30 – 2021-09-02 (×9): 1 mg via INTRAVENOUS
  Filled 2021-08-30 (×9): qty 1
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
## 2021-08-30 NOTE — Progress Notes (Signed)
OT Cancellation Note  Patient Details Name: Brittany Hobbs MRN: [id number] DOB: 1961-02-01   Cancelled Treatment:    Reason Eval/Treat Not Completed: Patient at procedure or test/ unavailable (with Psych) Will attempt later time.  Ramond Dial, OT/L   Acute OT Clinical Specialist Acute Rehabilitation Services Pager [phone] Office [phone]  08/30/2021, 1:48 PM
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
## 2021-08-31 ENCOUNTER — Encounter (HOSPITAL_COMMUNITY): Payer: Self-pay | Admitting: Student

## 2021-08-31 LAB — GLUCOSE, CAPILLARY: Glucose-Capillary: 92 mg/dL (ref 70–99)
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
## 2021-08-31 NOTE — Plan of Care (Signed)
  Problem: Education: Goal: Knowledge of General Education information will improve Description: Including pain rating scale, medication(s)/side effects and non-pharmacologic comfort measures Outcome: Progressing   Problem: Coping: Goal: Level of anxiety will decrease Outcome: Progressing   Problem: Pain Managment: Goal: General experience of comfort will improve Outcome: Progressing   
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
## 2021-08-31 NOTE — TOC Progression Note (Signed)
Transition of Care [facility]) - Progression Note    Patient Details  Name: Brittany Hobbs MRN: [id number] Date of Birth: 12-21-1960  [facility] of [facility]) CM/SW Contact  Ella Bodo, RN Phone Number: 08/31/2021, 3:24 PM  Clinical Narrative:    Patient now agreeable to skilled nursing facility placement, as recommended by PT/OT.  She is also agreeable to information being faxed out to area nursing facilities for bed search.  She states that she would appreciate communication with her sister, [name], to assist her with this process.  Will initiate FL-2 for bed search.   Expected Discharge Plan: [place] Barriers to Discharge: Continued Medical Work up  Expected Discharge Plan and Services Expected Discharge Plan: [place]   Discharge Planning Services: CM Consult Post Acute Care Choice: [place] Living arrangements for the past 2 months: Single Family Home                 DME Arranged: Gilford Rile platform DME Agency: [employer] Date DME Agency Contacted: 08/29/21 Time DME Agency Contacted: [phone] Representative spoke with at DME Agency: Freda Munro HH Arranged: PT, OT [place] Agency: [place] Date [place]: 08/29/21 Time [place]: 1659 Representative spoke with at [place]: [place] ([place]) Interventions    Readmission Risk Interventions Readmission Risk Prevention Plan 06/06/2021  Post Dischage Appt Not Complete  Appt Comments patient will amke follow appointment with her md  Medication Screening Complete  Transportation Screening Complete  Some recent data might be hidden   Reinaldo Raddle, RN, BSN  Trauma/Neuro ICU Case Manager [phone]

## 2021-09-01 LAB — CREATININE, SERUM
Creatinine, Ser: 0.59 mg/dL (ref 0.44–1.00)
GFR, Estimated: >60 mL/min (ref >60)

## 2021-09-01 MED ORDER — LORAZEPAM 0.5 MG PO TABS
0.5000 mg | ORAL_TABLET | Freq: Once | ORAL | Status: AC
  Administered 2021-09-01: 0.5 mg via ORAL
  Filled 2021-09-01: qty 1

## 2021-09-01 MED ORDER — DOCUSATE SODIUM 100 MG PO CAPS
100.0000 mg | ORAL_CAPSULE | Freq: Every day | ORAL | Status: DC
  Administered 2021-09-01 – 2021-09-05 (×5): 100 mg via ORAL
  Filled 2021-09-01 (×5): qty 1

## 2021-09-01 MED ORDER — SENNOSIDES-DOCUSATE SODIUM 8.6-50 MG PO TABS
1.0000 | ORAL_TABLET | Freq: Every day | ORAL | Status: DC
  Administered 2021-09-01 – 2021-09-04 (×4): 1 via ORAL
  Filled 2021-09-01 (×4): qty 1
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
## 2021-09-01 NOTE — Progress Notes (Signed)
CCMD called, stated that patient has 7bts Vtach. Patient was checked, she is alert and verbal. Denies any chestpain, asymptomatic. Paged Zenia Resides, MD.   Call bell within reach and ill continue to close monitor patient.
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
## 2021-09-02 LAB — RESP PANEL BY RT-PCR (FLU A&B, COVID) ARPGX2
Influenza A by PCR: NEGATIVE
Influenza B by PCR: NEGATIVE
SARS Coronavirus 2 by RT PCR: NEGATIVE

## 2021-09-02 MED ORDER — IBUPROFEN 400 MG PO TABS
400.0000 mg | ORAL_TABLET | Freq: Four times a day (QID) | ORAL | Status: DC
  Administered 2021-09-02 – 2021-09-04 (×11): 400 mg via ORAL
  Filled 2021-09-02 (×11): qty 1
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
## 2021-09-02 NOTE — TOC Progression Note (Signed)
Transition of Care [facility]) - Progression Note    Patient Details  Name: Brittany Hobbs MRN: [id number] Date of Birth: 1961/04/15  Transition of Care [facility]) CM/SW Contact  Ella Bodo, RN Phone Number: 09/02/2021, 1:38 PM  Clinical Narrative:    Insurance authorization remains pending for [employer], per [place] in admissions at facility.  Per admissions coordinator, facility can accept over the weekend if insurance Josem Kaufmann is received.    Expected Discharge Plan: [place] Barriers to Discharge: Continued Medical Work up  Expected Discharge Plan and Services Expected Discharge Plan: [place]   Discharge Planning Services: CM Consult Post Acute Care Choice: [place] Living arrangements for the past 2 months: Single Family Home                 DME Arranged: Gilford Rile platform DME Agency: [employer] Date DME Agency Contacted: 08/29/21 Time DME Agency Contacted: [phone] Representative spoke with at DME Agency: Freda Munro HH Arranged: PT, OT [place] Agency: [place] Date [place]: 08/29/21 Time [place]: 1659 Representative spoke with at [place]: [place] ([place]) Interventions    Readmission Risk Interventions Readmission Risk Prevention Plan 06/06/2021  Post Dischage Appt Not Complete  Appt Comments patient will amke follow appointment with her md  Medication Screening Complete  Transportation Screening Complete  Some recent data might be hidden   Reinaldo Raddle, RN, BSN  Trauma/Neuro ICU Case Manager [phone]
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
## 2021-09-03 MED ORDER — CYANOCOBALAMIN 500 MCG PO TABS: 500.0000 ug | ORAL_TABLET | Freq: Every day | ORAL | Status: DC

## 2021-09-03 MED ORDER — OXYCODONE HCL 5 MG PO TABS: 5.0000 mg | ORAL_TABLET | ORAL | 0 refills | Status: DC | PRN

## 2021-09-03 MED ORDER — BACITRACIN ZINC 500 UNIT/GM EX OINT: 1.0000 "application " | TOPICAL_OINTMENT | Freq: Two times a day (BID) | CUTANEOUS | 0 refills | Status: DC

## 2021-09-03 MED ORDER — POLYETHYLENE GLYCOL 3350 17 G PO PACK
17.0000 g | PACK | Freq: Every day | ORAL | 0 refills | Status: DC | PRN
Start: 2021-09-03 — End: 2021-09-05

## 2021-09-03 MED ORDER — DOCUSATE SODIUM 100 MG PO CAPS: 100.0000 mg | ORAL_CAPSULE | Freq: Every day | ORAL | 0 refills | Status: DC

## 2021-09-03 MED ORDER — GUAIFENESIN-DM 100-10 MG/5ML PO SYRP: 5.0000 mL | ORAL_SOLUTION | ORAL | 0 refills | Status: DC | PRN

## 2021-09-03 MED ORDER — SENNOSIDES-DOCUSATE SODIUM 8.6-50 MG PO TABS: 1.0000 | ORAL_TABLET | Freq: Every day | ORAL | Status: DC

## 2021-09-03 MED ORDER — ALBUTEROL SULFATE HFA 108 (90 BASE) MCG/ACT IN AERS
2.0000 | INHALATION_SPRAY | RESPIRATORY_TRACT | Status: DC | PRN
  Filled 2021-09-03: qty 6.7

## 2021-09-03 MED ORDER — CYCLOBENZAPRINE HCL 5 MG PO TABS
5.0000 mg | ORAL_TABLET | Freq: Three times a day (TID) | ORAL | 0 refills | Status: DC | PRN
Start: 2021-09-03 — End: 2021-09-05

## 2021-09-03 MED ORDER — IBUPROFEN 400 MG PO TABS: 400.0000 mg | ORAL_TABLET | Freq: Four times a day (QID) | ORAL | 0 refills | Status: DC

## 2021-09-03 MED ORDER — LORAZEPAM 1 MG PO TABS: 1.0000 mg | ORAL_TABLET | Freq: Every day | ORAL | 0 refills | Status: DC

## 2021-09-03 MED ORDER — METOPROLOL TARTRATE 25 MG PO TABS: 12.5000 mg | ORAL_TABLET | Freq: Every day | ORAL | Status: DC

## 2021-09-03 MED ORDER — ACETAMINOPHEN 325 MG PO TABS: 650.0000 mg | ORAL_TABLET | Freq: Four times a day (QID) | ORAL | Status: DC

## 2021-09-03 MED ORDER — VITAMIN D3 25 MCG PO TABS: 5000.0000 [IU] | ORAL_TABLET | Freq: Every day | ORAL | Status: DC

## 2021-09-03 MED ORDER — AMPHETAMINE-DEXTROAMPHETAMINE 15 MG PO TABS: 15.0000 mg | ORAL_TABLET | Freq: Every day | ORAL | 0 refills | Status: DC

## 2021-09-03 NOTE — Progress Notes (Signed)
Inpatient Rehab Admissions Coordinator Note:   Per PT patient was screened for CIR candidacy by Chaia Ikard Danford Bad, CCC-SLP. At this time, pt appears to be too high functioning (Mod I and sometimes requiring vocal cues) to be considered for a CIR admission. Other therapy venues would be more appropriate.    Gayland Curry, [place], [place] Admissions Coordinator [phone] 09/03/21 4:42 PM
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
## 2021-09-03 NOTE — TOC Progression Note (Incomplete)
Transition of Care [facility]) - Progression Note    Patient Details  Name: Brittany Hobbs MRN: [id number] Date of Birth: [birth year]  [facility] CM/SW Contact  Emeterio Reeve, [place] Phone Number: 09/03/2021, 3:16 PM  Clinical Narrative:       Expected Discharge Plan: [place] Barriers to Discharge: Continued Medical Work up  Expected Discharge Plan and Services Expected Discharge Plan: [place]   Discharge Planning Services: CM Consult Post Acute Care Choice: [place] Living arrangements for the past 2 months: Single Family Home                 DME Arranged: Gilford Rile platform DME Agency: [employer] Date DME Agency Contacted: 08/29/21 Time DME Agency Contacted: [phone] Representative spoke with at DME Agency: Freda Munro HH Arranged: PT, OT [facility] Agency: [facility] Date [place]: 08/29/21 Time [place]: 1659 Representative spoke with at [place]: [place] ([place]) Interventions    Readmission Risk Interventions Readmission Risk Prevention Plan 06/06/2021  Post Dischage Appt Not Complete  Appt Comments patient will amke follow appointment with her md  Medication Screening Complete  Transportation Screening Complete  Some recent data might be hidden
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
## 2021-09-04 NOTE — Plan of Care (Signed)
°  Problem: Nutrition: Goal: Adequate nutrition will be maintained Outcome: Progressing   Problem: Activity: Goal: Risk for activity intolerance will decrease Outcome: Progressing   Problem: Coping: Goal: Level of anxiety will decrease Outcome: Progressing
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
## 2021-09-05 MED ORDER — ACETAMINOPHEN 325 MG PO TABS: 650.0000 mg | ORAL_TABLET | Freq: Four times a day (QID) | ORAL | Status: AC

## 2021-09-05 MED ORDER — OXYCODONE HCL 5 MG PO TABS: 5.0000 mg | ORAL_TABLET | ORAL | 0 refills | Status: AC | PRN

## 2021-09-05 MED ORDER — CYCLOBENZAPRINE HCL 5 MG PO TABS: 5.0000 mg | ORAL_TABLET | Freq: Three times a day (TID) | ORAL | 0 refills | Status: AC | PRN

## 2021-09-05 MED ORDER — POLYETHYLENE GLYCOL 3350 17 G PO PACK: 17.0000 g | PACK | Freq: Every day | ORAL | 0 refills | Status: AC | PRN

## 2021-09-05 MED ORDER — DOCUSATE SODIUM 100 MG PO CAPS: 100.0000 mg | ORAL_CAPSULE | Freq: Every day | ORAL | 0 refills | Status: DC

## 2021-09-05 MED ORDER — CYANOCOBALAMIN 500 MCG PO TABS: 500.0000 ug | ORAL_TABLET | Freq: Every day | ORAL | 0 refills | Status: AC

## 2021-09-05 MED ORDER — IBUPROFEN 400 MG PO TABS: 400.0000 mg | ORAL_TABLET | Freq: Four times a day (QID) | ORAL | Status: AC

## 2021-09-05 MED ORDER — VITAMIN D3 25 MCG PO TABS: 1000.0000 [IU] | ORAL_TABLET | Freq: Every day | ORAL | 0 refills | Status: AC

## 2021-09-05 MED ORDER — SENNOSIDES-DOCUSATE SODIUM 8.6-50 MG PO TABS: 1.0000 | ORAL_TABLET | Freq: Every day | ORAL | Status: DC
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
## 2021-09-26 ENCOUNTER — Other Ambulatory Visit: Payer: Self-pay | Admitting: Nurse Practitioner

## 2021-09-26 DIAGNOSIS — R011 Cardiac murmur, unspecified: Secondary | ICD-10-CM

## 2021-10-18 DIAGNOSIS — Z933 Colostomy status: Secondary | ICD-10-CM | POA: Diagnosis not present

## 2021-11-09 NOTE — Telephone Encounter (Signed)
From pt

## 2021-12-05 ENCOUNTER — Ambulatory Visit: Payer: Self-pay | Admitting: Surgery

## 2021-12-05 DIAGNOSIS — K635 Polyp of colon: Secondary | ICD-10-CM | POA: Insufficient documentation

## 2021-12-05 DIAGNOSIS — K580 Irritable bowel syndrome with diarrhea: Secondary | ICD-10-CM | POA: Diagnosis present

## 2021-12-05 DIAGNOSIS — M549 Dorsalgia, unspecified: Secondary | ICD-10-CM | POA: Insufficient documentation

## 2021-12-05 DIAGNOSIS — R739 Hyperglycemia, unspecified: Secondary | ICD-10-CM

## 2021-12-05 DIAGNOSIS — Z8719 Personal history of other diseases of the digestive system: Secondary | ICD-10-CM | POA: Insufficient documentation

## 2021-12-07 NOTE — Progress Notes (Signed)
?Charlann Boxer D.O. ?[facility] ?[place] ?Phone: [phone] ?Subjective:   ?I, Jacqualin Combes, am serving as a scribe for Dr. Hulan Saas. ? ?This visit occurred during the SARS-CoV-2 public health emergency.  Safety protocols were in place, including screening questions prior to the visit, additional usage of staff PPE, and extensive cleaning of exam room while observing appropriate contact time as indicated for disinfecting solutions.  ? ? ?I'm seeing this patient by the request  of:  Berkley Harvey, NP ? ?CC: hand pain  ? ?[id number]  ?April 2022 ?Patient actually is not having any significant triggering at this moment.  Patient's hands though do appear that patient may be having more of a synovitis and seems to be possibly a lupus flare.  Patient has had this since she was the age of 25 and will follow up with her primary care provider.  Home exercises.  Follow-up with me again in 6 weeks to further evaluate. ?  ?Did discuss with patient about the possibility of turmeric.  Discussed side effects.  Discussed not taking it with anti-inflammatories.  We will see how patient responds.  ? ?Updated 12/08/2021 ?Kienna Moncada is a 61 y.o. female coming in with complaint of right hand pain. Wants injection. Patient has triggering of R hand ring finger.  ? ?R foot is not improving and she would like referral to PT. Home health ? ? ?  ? ?Past Medical History:  ?Diagnosis Date  ? Arthritis   ? Asthma   ? Diverticulitis   ? Hyperlipidemia   ? Lupus ([place])   ? Panic attacks   ? Pneumonia 09/2020  ? with covid  ? PONV (postoperative nausea and vomiting)   ? Seasonal allergies   ? ?Past Surgical History:  ?Procedure Laterality Date  ? COLON RESECTION SIGMOID N/A 06/10/2021  ? Procedure: OPEN SIGMOID COLON RESECTION WITH END COLOSTOMY AND ABDOMINAL WASHOUT;  Surgeon: Dwan Bolt, MD;  Location: [facility];  Service: General;  Laterality: N/A;  ? CYSTOSCOPY WITH STENT PLACEMENT   06/10/2021  ? Procedure: CYSTOSCOPY WITH LEFT STENT PLACEMENT;  Surgeon: Dwan Bolt, MD;  Location: [facility];  Service: General;;  ? LAPAROTOMY N/A 06/19/2021  ? Procedure: EXPLORATORY LAPAROTOMY;  Surgeon: Erroll Luna, MD;  Location: [facility];  Service: General;  Laterality: N/A;  ? ORIF CALCANEOUS FRACTURE Right 08/26/2021  ? Procedure: OPEN REDUCTION INTERNAL FIXATION (ORIF) CALCANEOUS FRACTURE;  Surgeon: Shona Needles, MD;  Location: [facility];  Service: Orthopedics;  Laterality: Right;  ? PATELLA FRACTURE SURGERY Left   ? scar tissue eye  1983  ? right  ? TOTAL KNEE ARTHROPLASTY Right 05/20/2021  ? Procedure: RIGHT TOTAL KNEE ARTHROPLASTY;  Surgeon: Mcarthur Rossetti, MD;  Location: [facility];  Service: Orthopedics;  Laterality: Right;  Needs RNFA  ? TUBAL LIGATION  1985  ? WRIST FRACTURE SURGERY Right   ? ?Social History  ? ?Socioeconomic History  ? Marital status: Widowed  ?  Spouse name: Not on file  ? Number of children: Not on file  ? Years of education: Not on file  ? Highest education level: Not on file  ?Occupational History  ? Not on file  ?Tobacco Use  ? Smoking status: Every Day  ?  Packs/day: 0.30  ?  Years: 30.00  ?  Pack years: 9.00  ?  Types: Cigarettes  ? Smokeless tobacco: Never  ? Tobacco comments:  ?  Trying to quit  ?Vaping  Use  ? Vaping Use: Never used  ?Substance and Sexual Activity  ? Alcohol use: No  ? Drug use: Yes  ?  Frequency: 7.0 times per week  ?  Types: Marijuana  ?  Comment: CBD  ? Sexual activity: Not on file  ?Other Topics Concern  ? Not on file  ?Social History Narrative  ? Not on file  ? ?Social Determinants of Health  ? ?Financial Resource Strain: Not on file  ?Food Insecurity: Not on file  ?Transportation Needs: Not on file  ?Physical Activity: Not on file  ?Stress: Not on file  ?Social Connections: Not on file  ? ?Allergies  ?Allergen Reactions  ? Contrast Media [Iodinated Contrast Media] Anaphylaxis  ? Iodine Anaphylaxis  ?  Pt states she is allergic to INTERNAL IODINE   ? Betadine [Povidone Iodine] Hives  ? Methocarbamol Nausea Only  ?  Felt nauseated and sick  ? Sulfa Antibiotics   ?  N/V  ? ?Family History  ?Problem Relation Age of Onset  ? Colon cancer Neg Hx   ? Stomach cancer Neg Hx   ? Esophageal cancer Neg Hx   ? Rectal cancer Neg Hx   ? ? ?Current Outpatient Medications (Endocrine & Metabolic):  ?  predniSONE (DELTASONE) 10 MG tablet, Take 10 mg by mouth daily with breakfast. ? ?Current Outpatient Medications (Cardiovascular):  ?  metoprolol tartrate (LOPRESSOR) 25 MG tablet, Take 12.5 mg by mouth daily. ? ?Current Outpatient Medications (Respiratory):  ?  albuterol (VENTOLIN HFA) 108 (90 Base) MCG/ACT inhaler, Inhale 2 puffs into the lungs every 6 (six) hours as needed for wheezing or shortness of breath. ? ?Current Outpatient Medications (Analgesics):  ?  aspirin 81 MG chewable tablet, Chew 1 tablet (81 mg total) by mouth 2 (two) times daily. ? ? ?Current Outpatient Medications (Other):  ?  amphetamine-dextroamphetamine (ADDERALL) 20 MG tablet, Take 20 mg by mouth daily. ?  carboxymethylcellulose (REFRESH PLUS) 0.5 % SOLN, Place 1 drop into both eyes 3 (three) times daily as needed (dry eyes). ?  escitalopram (LEXAPRO) 20 MG tablet, Take 20 mg by mouth daily. ?  gabapentin (NEURONTIN) 400 MG capsule, Take 800 mg by mouth 3 (three) times daily. ?  LORazepam (ATIVAN) 0.5 MG tablet, Take 0.5-1 mg by mouth See admin instructions. 0.'5mg'$  in AM and '1mg'$  in evening ?  docusate sodium (COLACE) 100 MG capsule, Take 1 capsule (100 mg total) by mouth daily. ?  nystatin (MYCOSTATIN) 100000 UNIT/ML suspension, Take 5 mLs by mouth 4 (four) times daily as needed (dry mouth). ?  polyethylene glycol (MIRALAX / GLYCOLAX) 17 g packet, Take 17 g by mouth daily as needed for mild constipation. ?  senna-docusate (SENOKOT-S) 8.6-50 MG tablet, Take 1 tablet by mouth at bedtime. ? ? ?Reviewed prior external information including notes and imaging from  ?primary care provider ?As well as notes that  were available from care everywhere and other healthcare systems. ? ?Past medical history, social, surgical and family history all reviewed in electronic medical record.  No pertanent information unless stated regarding to the chief complaint.  ? ?Review of Systems: ? No headache, visual changes, nausea, vomiting, diarrhea, constipation, dizziness, abdominal pain, skin rash, fevers, chills, night sweats, weight loss, swollen lymph nodes, body aches, joint swelling, chest pain, shortness of breath, mood changes. POSITIVE muscle aches ? ?Objective  ?Blood pressure 132/74, pulse 87, height '5\' 1"'$  (1.549 m), weight 134 lb (60.8 kg), SpO2 97 %. ?  ?General: No apparent distress alert and oriented  x3 mood and affect normal, dressed appropriately.  ?HEENT: Pupils equal, extraocular movements intact  ?Respiratory: Patient's speak in full sentences and does not appear short of breath  ?Cardiovascular: No lower extremity edema, non tender, no erythema  ?Gait mild antalgic favoring right leg  ?MSK: Patient's right ankle does have some limited dorsiflexion of only 10 degrees.  Patient does have tightness of the posterior cord.  Patient though does have likely a relatively good plantarflexion.  Neurovascular intact distally. ? ?Right hand exam shows the patient does have triggering noted Julli noted at the A2 pulley of the ring finger.  Patient does have triggering noted today. ? ?Procedure: Real-time Ultrasound Guided Injection of right flexor tendon sheath ?Device: GE Logiq Q7 ?Ultrasound guided injection is preferred based studies that show increased duration, increased effect, greater accuracy, decreased procedural pain, increased response rate, and decreased cost with ultrasound guided versus blind injection.  ?Verbal informed consent obtained.  ?Time-out conducted.  ?Noted no overlying erythema, induration, or other signs of local infection.  ?Skin prepped in a sterile fashion.  ?Local anesthesia: Topical Ethyl chloride.   ?With sterile technique and under real time ultrasound guidance: With a 25-gauge half inch needle injected with 0.5 cc of 0.5% Marcaine and 0.5 cc of Kenalog 40 mg/mL ?Completed without difficulty  ?Pa

## 2021-12-08 ENCOUNTER — Ambulatory Visit: Payer: Self-pay

## 2021-12-08 ENCOUNTER — Other Ambulatory Visit: Payer: Self-pay

## 2021-12-08 ENCOUNTER — Ambulatory Visit (INDEPENDENT_AMBULATORY_CARE_PROVIDER_SITE_OTHER): Payer: BC Managed Care – PPO | Admitting: Family Medicine

## 2021-12-08 ENCOUNTER — Encounter: Payer: Self-pay | Admitting: Family Medicine

## 2021-12-08 VITALS — BP 132/74 | HR 87 | Ht 61.0 in | Wt 134.0 lb

## 2021-12-08 DIAGNOSIS — M79641 Pain in right hand: Secondary | ICD-10-CM

## 2021-12-08 DIAGNOSIS — M329 Systemic lupus erythematosus, unspecified: Secondary | ICD-10-CM

## 2021-12-08 DIAGNOSIS — M65341 Trigger finger, right ring finger: Secondary | ICD-10-CM | POA: Diagnosis not present

## 2021-12-08 DIAGNOSIS — S92061A Displaced intraarticular fracture of right calcaneus, initial encounter for closed fracture: Secondary | ICD-10-CM | POA: Diagnosis not present

## 2021-12-08 NOTE — Assessment & Plan Note (Signed)
Patient is nearly 4 months out from surgery at this point.  Patient is walking on it but continues to have some limited range of motion especially with dorsiflexion with significant tightness of the posterior capsule of the ankle.  At this point we did discuss the possibility of formal physical therapy.  Patient though secondary to social determinants of health patient does not have transportation and would like to consider home health physical therapy.  Has attempted to talk to orthopedic surgeon but has not been able to get it prescribed.  We will put the referral in today so patient can start with range of motion exercises and hopefully will advance accordingly.  Follow-up with me if necessary in 6 to 8 weeks. ?

## 2021-12-08 NOTE — Assessment & Plan Note (Signed)
Chronic, with exacerbation.  Has been sometime since we have done it previously.  Patient is wanting to become more active again.  Did have a calcaneal fracture as well as the colectomy and is awaiting to have that reversed.  Patient hopefully will start to increase activity with the injection patient knows to watch for any type of signs of any infectious etiology.  Does have underlying lupus that I think contributes with that as well.  Follow-up with me again in 3 months if necessary or otherwise as needed. ?

## 2021-12-08 NOTE — Patient Instructions (Addendum)
Injected trigger finger today ?Home health will call you ?If any signs of infection occur such as redness, swelling, heat, or pain, please seek medical attention or go into emergency room for further evaluation ?See me again in 3 months if needed otherwise can push it out ?

## 2021-12-14 ENCOUNTER — Other Ambulatory Visit: Payer: Self-pay | Admitting: Urology

## 2021-12-22 ENCOUNTER — Encounter: Payer: Self-pay | Admitting: Family Medicine

## 2021-12-26 ENCOUNTER — Other Ambulatory Visit: Payer: Self-pay

## 2022-01-16 DIAGNOSIS — G9340 Encephalopathy, unspecified: Secondary | ICD-10-CM

## 2022-01-16 HISTORY — DX: Encephalopathy, unspecified: G93.40

## 2022-01-17 ENCOUNTER — Other Ambulatory Visit: Payer: Self-pay

## 2022-01-26 ENCOUNTER — Other Ambulatory Visit: Payer: Self-pay

## 2022-02-01 ENCOUNTER — Inpatient Hospital Stay (HOSPITAL_COMMUNITY)
Admission: EM | Admit: 2022-02-01 | Discharge: 2022-02-06 | DRG: 091 | Disposition: A | Discharge status: Home / Self Care | Payer: 59 | Attending: Internal Medicine | Admitting: Internal Medicine

## 2022-02-01 ENCOUNTER — Emergency Department (HOSPITAL_COMMUNITY): Payer: 59

## 2022-02-01 DIAGNOSIS — E785 Hyperlipidemia, unspecified: Secondary | ICD-10-CM | POA: Diagnosis not present

## 2022-02-01 DIAGNOSIS — Z933 Colostomy status: Secondary | ICD-10-CM

## 2022-02-01 DIAGNOSIS — S70312A Abrasion, left thigh, initial encounter: Secondary | ICD-10-CM | POA: Diagnosis present

## 2022-02-01 DIAGNOSIS — G9340 Encephalopathy, unspecified: Secondary | ICD-10-CM | POA: Diagnosis not present

## 2022-02-01 DIAGNOSIS — I517 Cardiomegaly: Secondary | ICD-10-CM | POA: Diagnosis not present

## 2022-02-01 DIAGNOSIS — J9601 Acute respiratory failure with hypoxia: Secondary | ICD-10-CM | POA: Diagnosis present

## 2022-02-01 DIAGNOSIS — R4182 Altered mental status, unspecified: Secondary | ICD-10-CM | POA: Diagnosis not present

## 2022-02-01 DIAGNOSIS — R69 Illness, unspecified: Secondary | ICD-10-CM | POA: Diagnosis not present

## 2022-02-01 DIAGNOSIS — F419 Anxiety disorder, unspecified: Secondary | ICD-10-CM | POA: Diagnosis present

## 2022-02-01 DIAGNOSIS — I10 Essential (primary) hypertension: Secondary | ICD-10-CM | POA: Diagnosis present

## 2022-02-01 DIAGNOSIS — Z888 Allergy status to other drugs, medicaments and biological substances status: Secondary | ICD-10-CM

## 2022-02-01 DIAGNOSIS — L930 Discoid lupus erythematosus: Secondary | ICD-10-CM | POA: Diagnosis present

## 2022-02-01 DIAGNOSIS — T424X5A Adverse effect of benzodiazepines, initial encounter: Secondary | ICD-10-CM | POA: Diagnosis present

## 2022-02-01 DIAGNOSIS — A419 Sepsis, unspecified organism: Secondary | ICD-10-CM

## 2022-02-01 DIAGNOSIS — J189 Pneumonia, unspecified organism: Secondary | ICD-10-CM | POA: Diagnosis not present

## 2022-02-01 DIAGNOSIS — I159 Secondary hypertension, unspecified: Secondary | ICD-10-CM | POA: Diagnosis not present

## 2022-02-01 DIAGNOSIS — F32A Depression, unspecified: Secondary | ICD-10-CM | POA: Diagnosis present

## 2022-02-01 DIAGNOSIS — M6282 Rhabdomyolysis: Secondary | ICD-10-CM | POA: Diagnosis present

## 2022-02-01 DIAGNOSIS — J690 Pneumonitis due to inhalation of food and vomit: Secondary | ICD-10-CM | POA: Diagnosis present

## 2022-02-01 DIAGNOSIS — R7401 Elevation of levels of liver transaminase levels: Secondary | ICD-10-CM

## 2022-02-01 DIAGNOSIS — R6520 Severe sepsis without septic shock: Secondary | ICD-10-CM | POA: Diagnosis not present

## 2022-02-01 DIAGNOSIS — G928 Other toxic encephalopathy: Principal | ICD-10-CM | POA: Diagnosis present

## 2022-02-01 DIAGNOSIS — Z20822 Contact with and (suspected) exposure to covid-19: Secondary | ICD-10-CM | POA: Diagnosis present

## 2022-02-01 DIAGNOSIS — T426X5A Adverse effect of other antiepileptic and sedative-hypnotic drugs, initial encounter: Secondary | ICD-10-CM | POA: Diagnosis present

## 2022-02-01 DIAGNOSIS — Z882 Allergy status to sulfonamides status: Secondary | ICD-10-CM

## 2022-02-01 DIAGNOSIS — Z91041 Radiographic dye allergy status: Secondary | ICD-10-CM

## 2022-02-01 DIAGNOSIS — Z7982 Long term (current) use of aspirin: Secondary | ICD-10-CM

## 2022-02-01 DIAGNOSIS — M329 Systemic lupus erythematosus, unspecified: Secondary | ICD-10-CM | POA: Diagnosis not present

## 2022-02-01 DIAGNOSIS — S80211A Abrasion, right knee, initial encounter: Secondary | ICD-10-CM | POA: Diagnosis present

## 2022-02-01 DIAGNOSIS — S0083XA Contusion of other part of head, initial encounter: Secondary | ICD-10-CM | POA: Diagnosis present

## 2022-02-01 DIAGNOSIS — T43625A Adverse effect of amphetamines, initial encounter: Secondary | ICD-10-CM | POA: Diagnosis present

## 2022-02-01 DIAGNOSIS — R9431 Abnormal electrocardiogram [ECG] [EKG]: Secondary | ICD-10-CM | POA: Diagnosis not present

## 2022-02-01 DIAGNOSIS — F902 Attention-deficit hyperactivity disorder, combined type: Secondary | ICD-10-CM | POA: Diagnosis not present

## 2022-02-01 DIAGNOSIS — F17210 Nicotine dependence, cigarettes, uncomplicated: Secondary | ICD-10-CM | POA: Diagnosis present

## 2022-02-01 DIAGNOSIS — E876 Hypokalemia: Secondary | ICD-10-CM | POA: Diagnosis not present

## 2022-02-01 DIAGNOSIS — Z7952 Long term (current) use of systemic steroids: Secondary | ICD-10-CM

## 2022-02-01 DIAGNOSIS — J45909 Unspecified asthma, uncomplicated: Secondary | ICD-10-CM | POA: Diagnosis present

## 2022-02-01 DIAGNOSIS — I674 Hypertensive encephalopathy: Secondary | ICD-10-CM | POA: Diagnosis present

## 2022-02-01 DIAGNOSIS — R7989 Other specified abnormal findings of blood chemistry: Secondary | ICD-10-CM

## 2022-02-01 DIAGNOSIS — T43225A Adverse effect of selective serotonin reuptake inhibitors, initial encounter: Secondary | ICD-10-CM | POA: Diagnosis present

## 2022-02-01 DIAGNOSIS — M1990 Unspecified osteoarthritis, unspecified site: Secondary | ICD-10-CM | POA: Diagnosis present

## 2022-02-01 LAB — COMPREHENSIVE METABOLIC PANEL
ALT: 238 U/L — ABNORMAL HIGH (ref 0–44)
AST: 281 U/L — ABNORMAL HIGH (ref 15–41)
Albumin: 3.7 g/dL (ref 3.5–5.0)
Alkaline Phosphatase: 109 U/L (ref 38–126)
Anion gap: 8 (ref 5–15)
BUN: 28 mg/dL — ABNORMAL HIGH (ref 6–20)
CO2: 28 mmol/L (ref 22–32)
Calcium: 9.2 mg/dL (ref 8.9–10.3)
Chloride: 106 mmol/L (ref 98–111)
Creatinine, Ser: 0.87 mg/dL (ref 0.44–1.00)
GFR, Estimated: >60 mL/min (ref >60)
Glucose, Bld: 112 mg/dL — ABNORMAL HIGH (ref 70–99)
Potassium: 4.8 mmol/L (ref 3.5–5.1)
Sodium: 142 mmol/L (ref 135–145)
Total Bilirubin: 0.7 mg/dL (ref 0.3–1.2)
Total Protein: 7.1 g/dL (ref 6.5–8.1)

## 2022-02-01 LAB — I-STAT VENOUS BLOOD GAS, ED
Acid-Base Excess: 1 mmol/L (ref 0.0–2.0)
Bicarbonate: 28.7 mmol/L — ABNORMAL HIGH (ref 20.0–28.0)
Calcium, Ion: 1.13 mmol/L — ABNORMAL LOW (ref 1.15–1.40)
HCT: 45 % (ref 36.0–46.0)
Hemoglobin: 15.3 g/dL — ABNORMAL HIGH (ref 12.0–15.0)
O2 Saturation: 49 %
Potassium: 4.4 mmol/L (ref 3.5–5.1)
Sodium: 141 mmol/L (ref 135–145)
TCO2: 30 mmol/L (ref 22–32)
pCO2, Ven: 55.1 mmHg (ref 44–60)
pH, Ven: 7.325 (ref 7.25–7.43)
pO2, Ven: 29 mmHg — CL (ref 32–45)

## 2022-02-01 LAB — CBC WITH DIFFERENTIAL/PLATELET
Abs Immature Granulocytes: 0 10*3/uL (ref 0.00–0.07)
Basophils Absolute: 0 10*3/uL (ref 0.0–0.1)
Basophils Relative: 0 %
Eosinophils Absolute: 0 10*3/uL (ref 0.0–0.5)
Eosinophils Relative: 0 %
HCT: 44.4 % (ref 36.0–46.0)
Hemoglobin: 13.4 g/dL (ref 12.0–15.0)
Lymphocytes Relative: 19 %
Lymphs Abs: 1.8 10*3/uL (ref 0.7–4.0)
MCH: 23.6 pg — ABNORMAL LOW (ref 26.0–34.0)
MCHC: 30.2 g/dL (ref 30.0–36.0)
MCV: 78.3 fL — ABNORMAL LOW (ref 80.0–100.0)
Monocytes Absolute: 0.6 10*3/uL (ref 0.1–1.0)
Monocytes Relative: 6 %
Neutro Abs: 7 10*3/uL (ref 1.7–7.7)
Neutrophils Relative %: 75 %
Platelets: 313 10*3/uL (ref 150–400)
RBC: 5.67 MIL/uL — ABNORMAL HIGH (ref 3.87–5.11)
RDW: 19.3 % — ABNORMAL HIGH (ref 11.5–15.5)
WBC: 9.3 10*3/uL (ref 4.0–10.5)
nRBC: 0 % (ref 0.0–0.2)
nRBC: 0 /100 WBC

## 2022-02-01 LAB — URINALYSIS, ROUTINE W REFLEX MICROSCOPIC
Bacteria, UA: NONE SEEN
Bilirubin Urine: NEGATIVE
Glucose, UA: NEGATIVE mg/dL
Ketones, ur: NEGATIVE mg/dL
Leukocytes,Ua: NEGATIVE
Nitrite: NEGATIVE
Protein, ur: 100 mg/dL — AB
Specific Gravity, Urine: 1.023 (ref 1.005–1.030)
pH: 5 (ref 5.0–8.0)

## 2022-02-01 LAB — RAPID URINE DRUG SCREEN, HOSP PERFORMED
Amphetamines: POSITIVE — AB
Barbiturates: NOT DETECTED
Benzodiazepines: NOT DETECTED
Cocaine: NOT DETECTED
Opiates: NOT DETECTED
Tetrahydrocannabinol: POSITIVE — AB

## 2022-02-01 LAB — LACTIC ACID, PLASMA: Lactic Acid, Venous: 2.7 mmol/L (ref 0.5–1.9)

## 2022-02-01 LAB — CBG MONITORING, ED: Glucose-Capillary: 114 mg/dL — ABNORMAL HIGH (ref 70–99)

## 2022-02-01 LAB — D-DIMER, QUANTITATIVE: D-Dimer, Quant: 1.19 ug/mL-FEU — ABNORMAL HIGH (ref 0.00–0.50)

## 2022-02-01 LAB — AMMONIA: Ammonia: 33 umol/L (ref 9–35)

## 2022-02-01 IMAGING — DX DG CHEST 1V PORT
1 series · 1 of 1 positions shown · non-contrast
Comparison: Chest x-ray [DATE].

CLINICAL DATA: Altered mental status.

EXAM:
PORTABLE CHEST 1 VIEW

[chest ap]
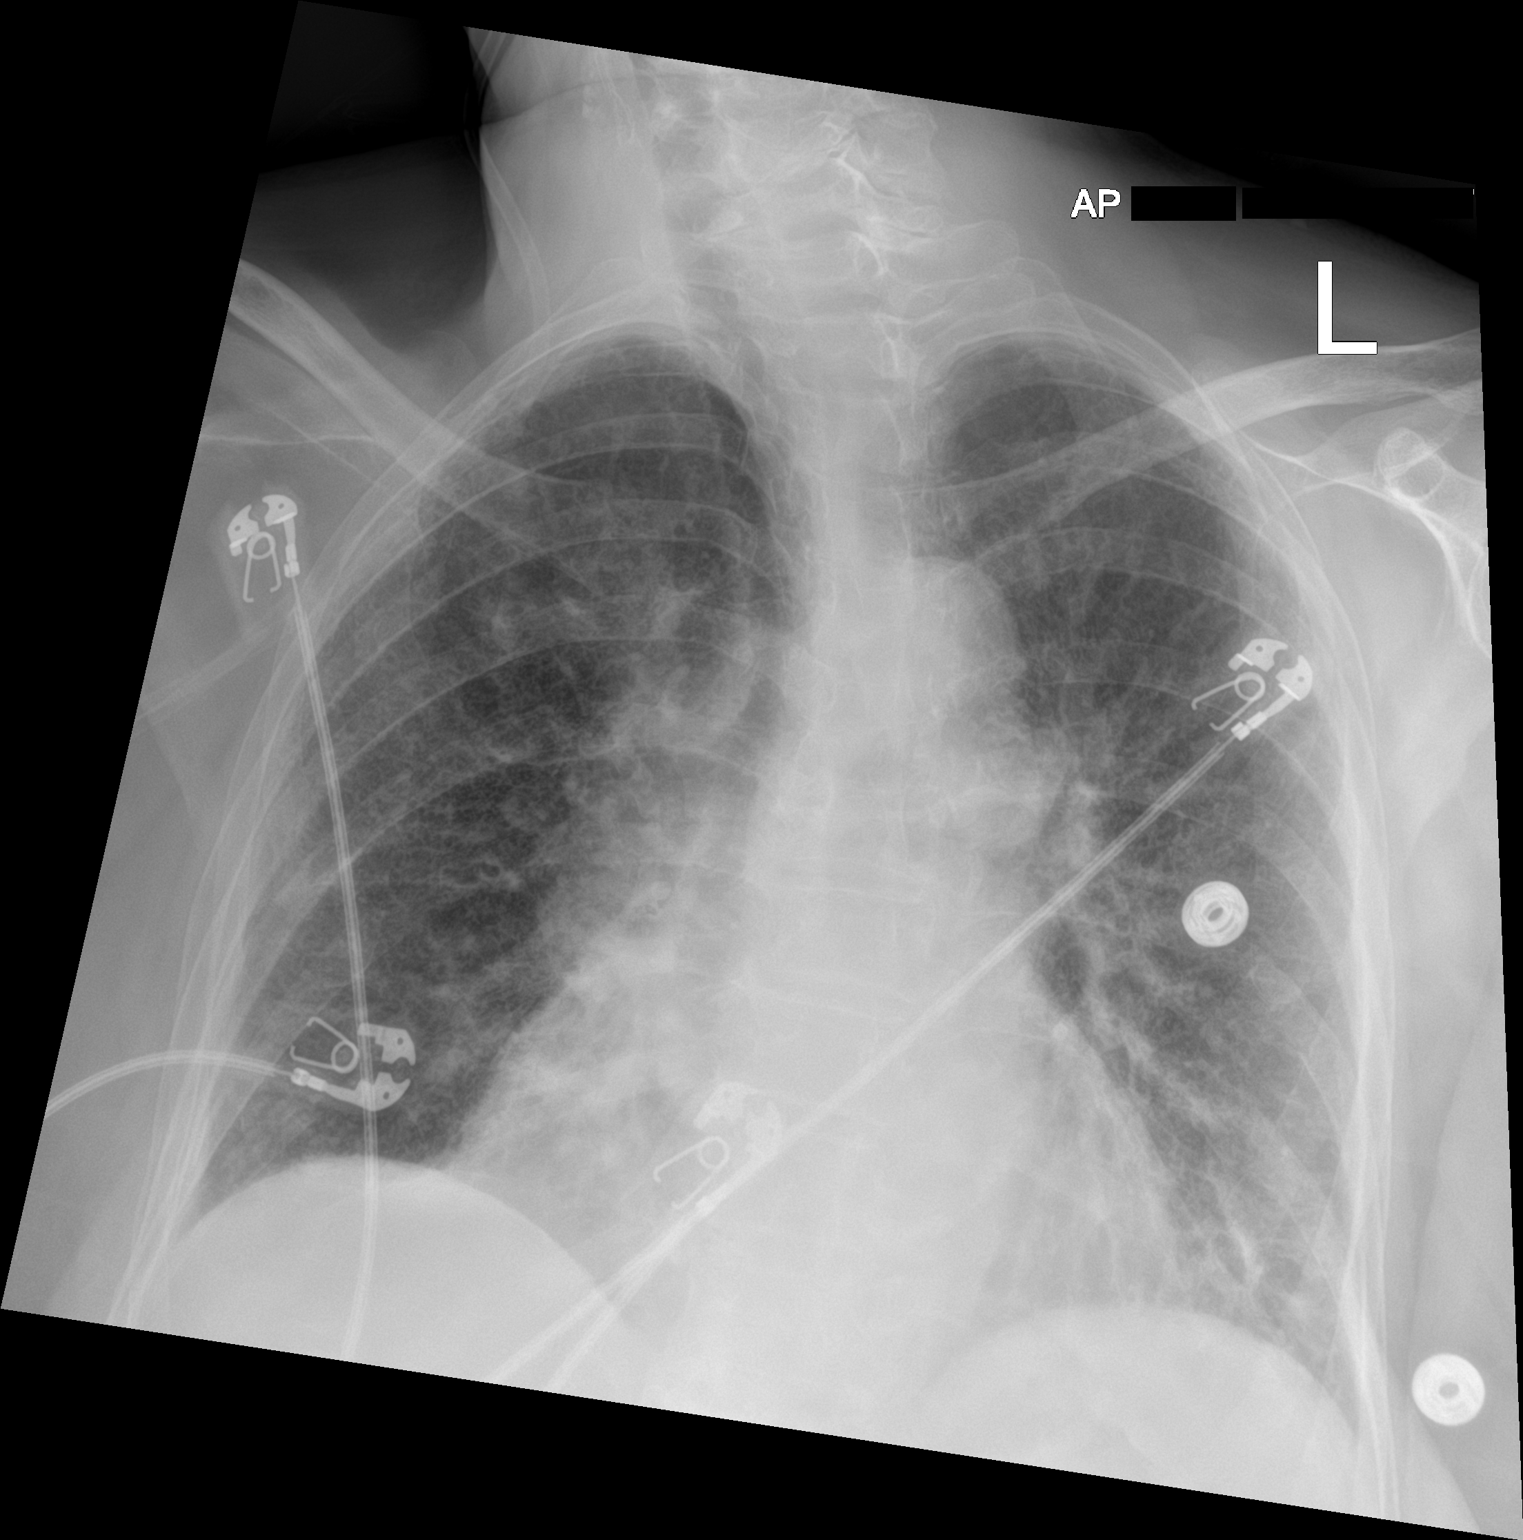

[1 of 1 positions shown; findings below may reference images not displayed]

FINDINGS: Cardiac silhouette is enlarged. There is diffuse interstitial
prominence and pulmonary vascular congestion. There is some strandy
and patchy opacities in both lower lungs. Costophrenic angles are
clear. No pneumothorax or acute fracture.
IMPRESSION: 1. Cardiomegaly with mild edema pattern.
2. Patchy opacities in the lower lungs may represent edema or
infection.

## 2022-02-01 IMAGING — CT CT HEAD W/O CM
4 series · 17 of 47 positions shown, 19 images · non-contrast
Comparison: Head CT dated [DATE].

CLINICAL DATA: Head trauma.



[Series 3: head wo · axial · 0.44mm/px · z∈[+1211,+1326]mm · 7 of 31 slices shown, 9 images]
[im 4/31  brain]
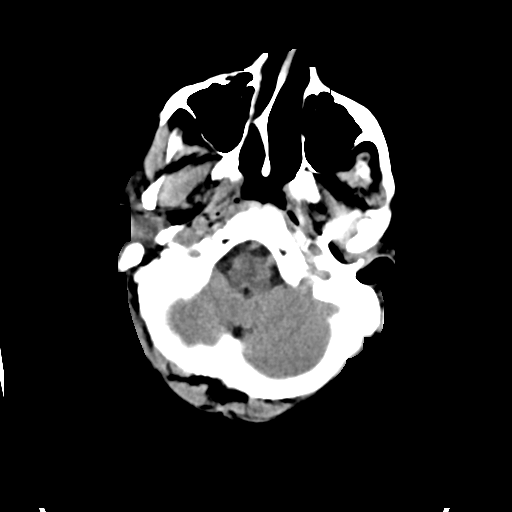
[im 4/31  bone]
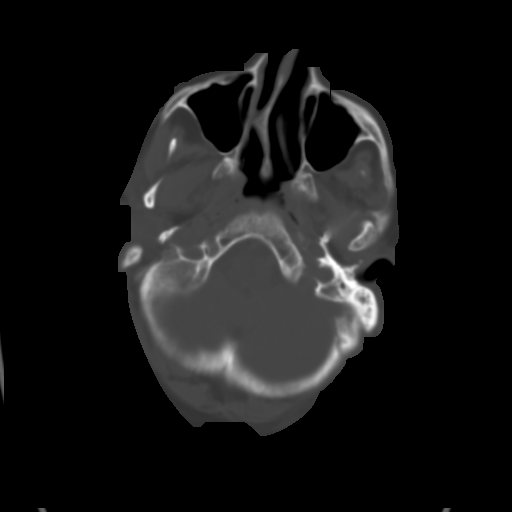
[im 8/31  brain]
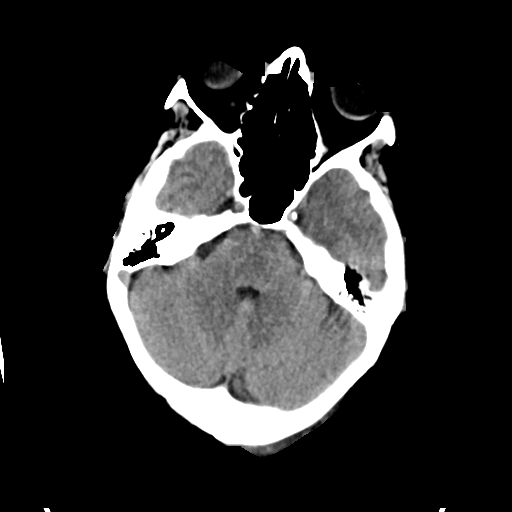
[im 12/31  brain]
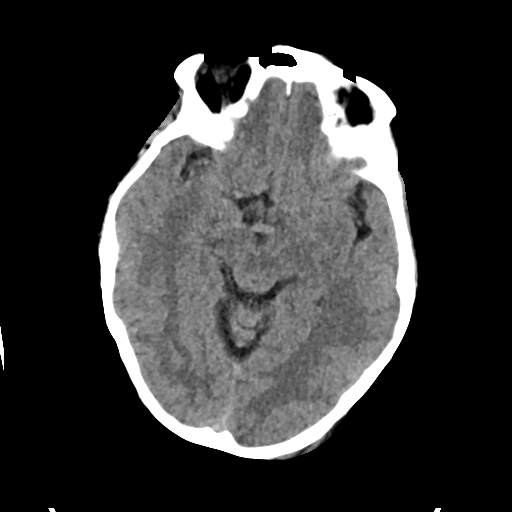
[im 16/31  brain]
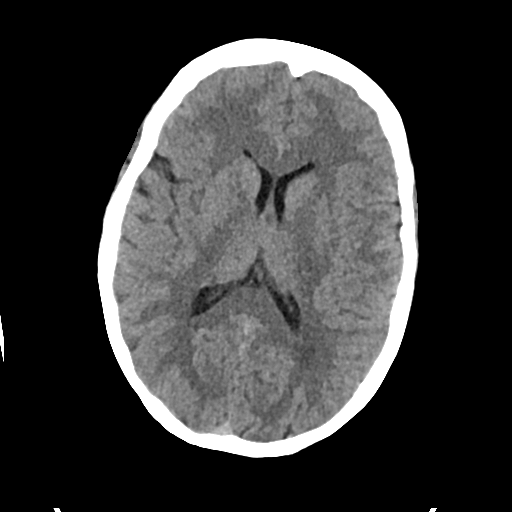
[im 19/31  brain]
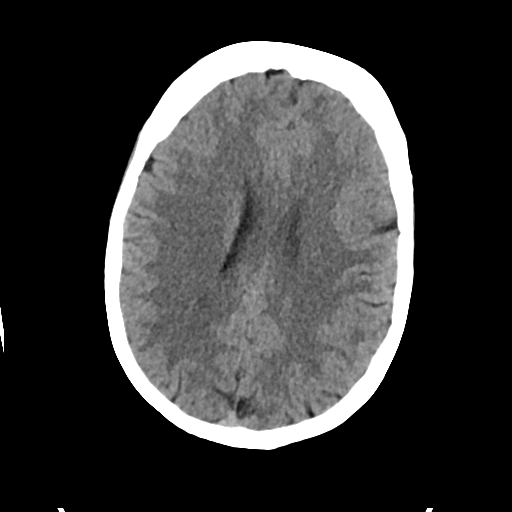
[im 19/31  bone]
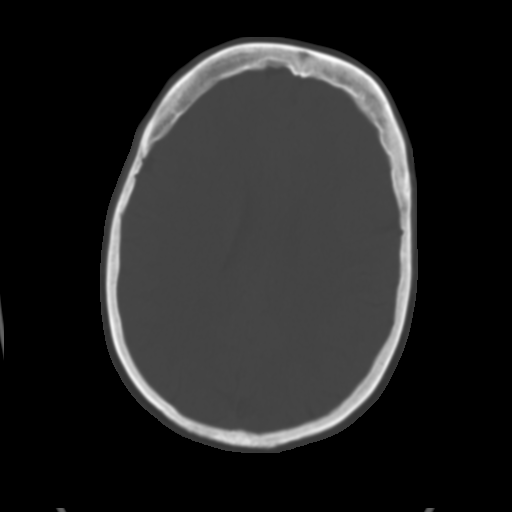
[im 23/31  brain]
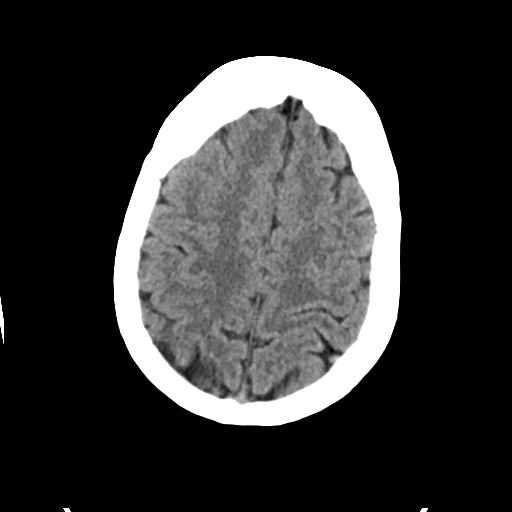
[im 27/31  brain]
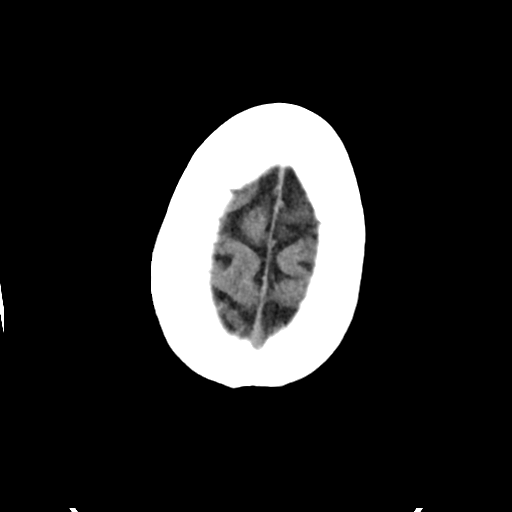

[Series 4: head bone · axial · 0.44mm/px · z∈[+1210,+1262]mm · 4 of 76 slices shown]
[im 8/76  bone]
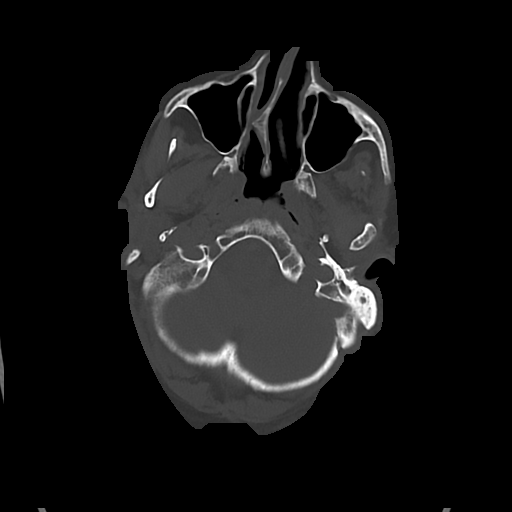
[im 16/76  bone]
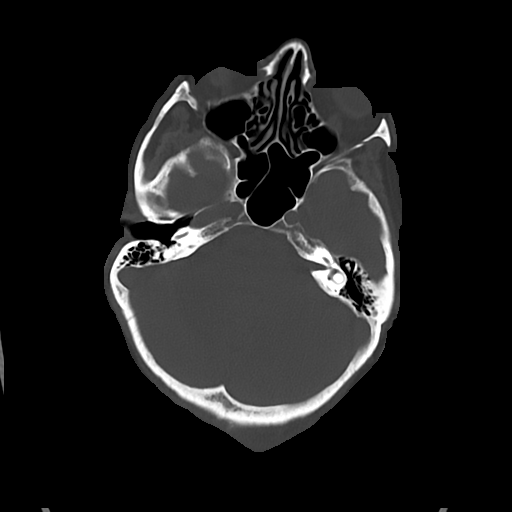
[im 23/76  bone]
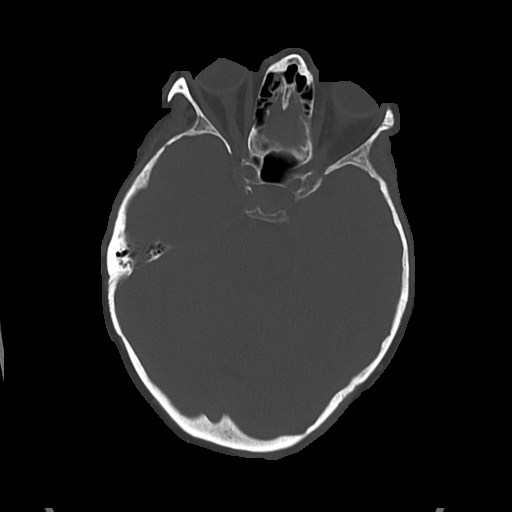
[im 34/76  bone]
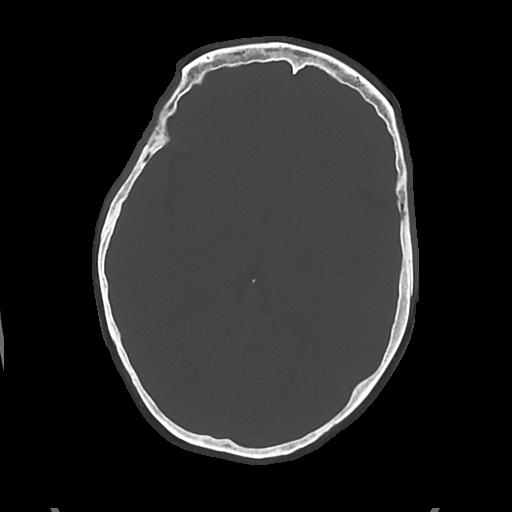

[Series 5: cor soft · coronal · 0.29mm/px · 3 of 69 slices shown]
[im 23/69  brain]
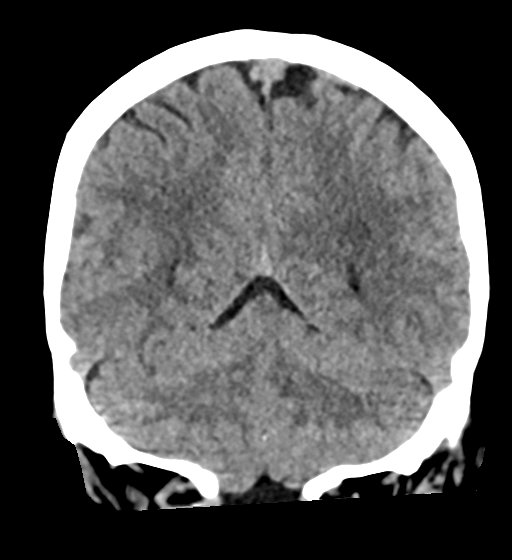
[im 31/69  brain]
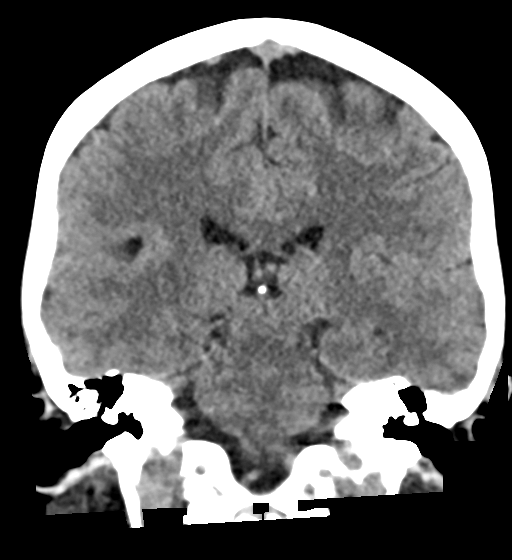
[im 38/69  brain]
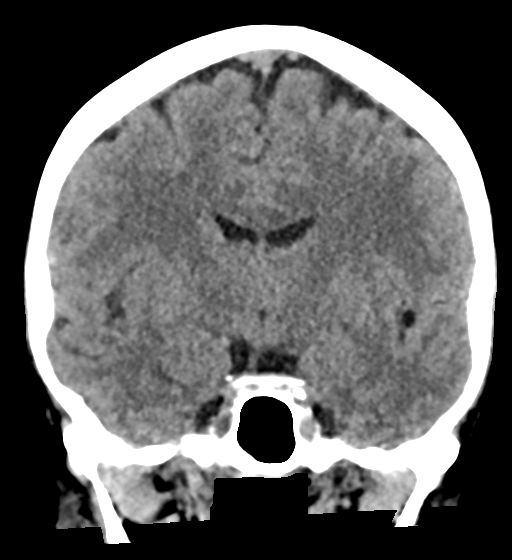

[Series 6: sag soft · sagittal · 0.32mm/px · 3 of 51 slices shown]
[im 17/51  brain]
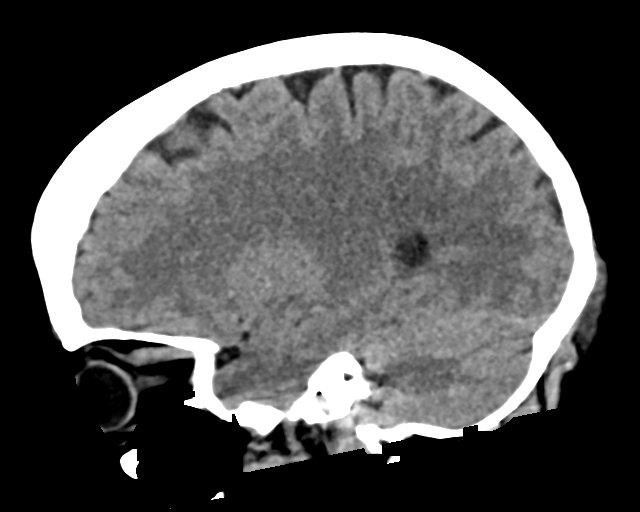
[im 26/51  brain]
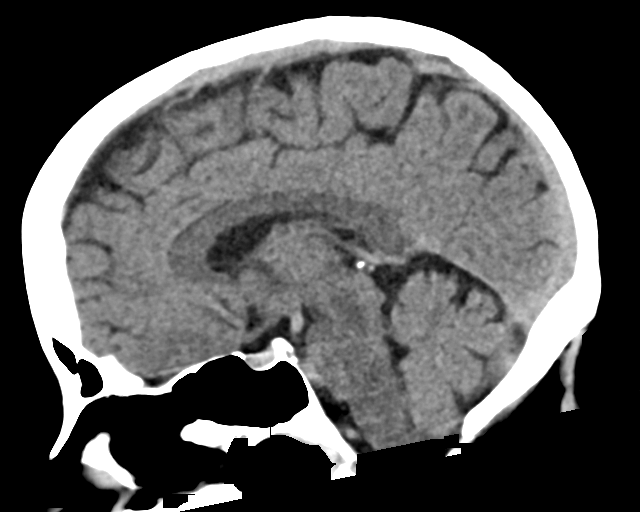
[im 34/51  brain]
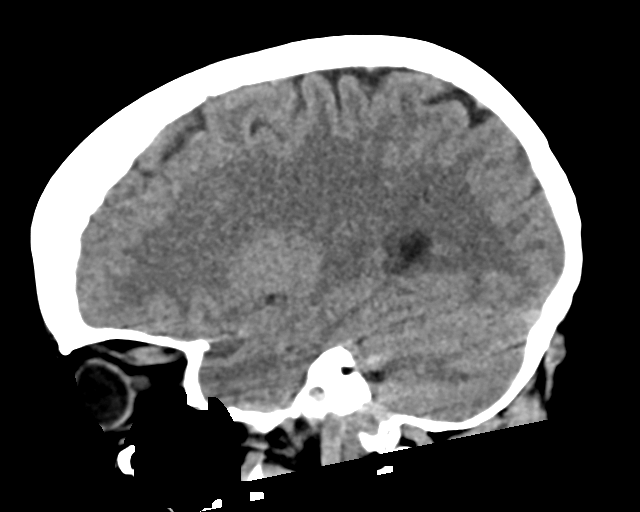

[17 of 47 positions shown; findings below may reference images not displayed]

FINDINGS: Brain: The ventricles and sulci are appropriate size for the
patient's age. The gray-white matter discrimination is preserved.
There is no acute intracranial hemorrhage. No mass effect or midline
shift. No extra-axial fluid collection.

Vascular: No hyperdense vessel or unexpected calcification.

Skull: Normal. Negative for fracture or focal lesion.

Sinuses/Orbits: No acute finding.

Other: None
IMPRESSION: No acute intracranial pathology.

## 2022-02-01 MED ORDER — ALBUTEROL SULFATE (2.5 MG/3ML) 0.083% IN NEBU: 3.0000 mL | INHALATION_SOLUTION | Freq: Four times a day (QID) | RESPIRATORY_TRACT | Status: DC | PRN

## 2022-02-01 MED ORDER — SODIUM CHLORIDE 0.9 % IV SOLN
500.0000 mg | Freq: Once | INTRAVENOUS | Status: AC
  Administered 2022-02-01: 500 mg via INTRAVENOUS
  Filled 2022-02-01: qty 5

## 2022-02-01 MED ORDER — METOPROLOL TARTRATE 12.5 MG HALF TABLET
12.5000 mg | ORAL_TABLET | Freq: Every day | ORAL | Status: DC
  Administered 2022-02-02: 12.5 mg via ORAL
  Filled 2022-02-01: qty 1

## 2022-02-01 MED ORDER — IBUPROFEN 200 MG PO TABS
400.0000 mg | ORAL_TABLET | Freq: Four times a day (QID) | ORAL | Status: DC | PRN
  Administered 2022-02-03: 400 mg via ORAL
  Filled 2022-02-01: qty 2

## 2022-02-01 MED ORDER — POLYETHYLENE GLYCOL 3350 17 G PO PACK: 17.0000 g | PACK | Freq: Every day | ORAL | Status: DC | PRN

## 2022-02-01 MED ORDER — SODIUM CHLORIDE 0.9% FLUSH
3.0000 mL | Freq: Two times a day (BID) | INTRAVENOUS | Status: DC
  Administered 2022-02-02 – 2022-02-05 (×3): 3 mL via INTRAVENOUS

## 2022-02-01 MED ORDER — SODIUM CHLORIDE 0.9 % IV SOLN
1.0000 g | Freq: Once | INTRAVENOUS | Status: AC
  Administered 2022-02-01: 1 g via INTRAVENOUS
  Filled 2022-02-01: qty 10

## 2022-02-01 MED ORDER — ENOXAPARIN SODIUM 40 MG/0.4ML IJ SOSY
40.0000 mg | PREFILLED_SYRINGE | INTRAMUSCULAR | Status: DC
  Administered 2022-02-02 – 2022-02-05 (×5): 40 mg via SUBCUTANEOUS
  Filled 2022-02-01 (×5): qty 0.4

## 2022-02-01 MED ORDER — PREDNISONE 20 MG PO TABS
10.0000 mg | ORAL_TABLET | Freq: Every day | ORAL | Status: DC
Start: 2022-02-02 — End: 2022-02-01

## 2022-02-01 NOTE — H&P (Signed)
?History and Physical  ? ?[name] [id number]:[id number] DOB: 06/24/1961 DOA: 02/01/2022 ? ?PCP: Berkley Harvey, NP  ? ?Patient coming from: Home ? ?Chief Complaint: AMS ? ?HPI: Brittany Hobbs is a 61 y.o. female with medical history significant of 6 diverticulitis with history of abscess and colostomy, lupus, hyperlipidemia, hypertension, anxiety, depression, ADHD, asthma presenting with altered mental status. ? ?Patient history obtained with assistance of chart review due to patient's altered mental status.  Patient was found down during a welfare check today.  Last known well was sometime yesterday when a neighbor saw her walking.  Daughter spoke to her last several days ago per reports.  On EMS evaluation patient was alert to person only and follows some commands.  Noted to have a right forehead hematoma and saturating in the 80s on room air. ? ?Unable to obtain review of systems due to patient's altered mentation. ? ?ED Course: Vital signs in the ED significant for blood pressure in the 448J to 856D systolic, requiring 3 L to maintain saturations.  Lab work-up included CMP with BUN 28, glucose 112, AST 281, ALT 238.  CBC with out acute abnormality.  D-dimer mildly elevated at 1.19.  Lactic acid mildly elevated 2.7 with repeat pending.  Respiratory panel for flu and COVID-negative.  Urinalysis with hemoglobin protein only.  UDS with amphetamine and THC.  Ammonia level normal.  Hepatitis panel pending.  VBG with normal pH and PCO2.  Chest x-ray with cardiomegaly and evidence of edema as well as patchy opacity representing infection versus edema.  CT head with no acute abnormality.  Patient received ceftriaxone and azithromycin in the ED. ? ?Review of Systems: Unable to obtain review of systems due to patient's altered mentation. ? ?Past Medical History:  ?Diagnosis Date  ? Arthritis   ? Asthma   ? Diverticulitis   ? Hyperlipidemia   ? Lupus ([place])   ? Panic attacks   ? Pneumonia 09/2020  ? with covid  ? PONV  (postoperative nausea and vomiting)   ? Seasonal allergies   ? ? ?Past Surgical History:  ?Procedure Laterality Date  ? COLON RESECTION SIGMOID N/A 06/10/2021  ? Procedure: OPEN SIGMOID COLON RESECTION WITH END COLOSTOMY AND ABDOMINAL WASHOUT;  Surgeon: Dwan Bolt, MD;  Location: [facility];  Service: General;  Laterality: N/A;  ? CYSTOSCOPY WITH STENT PLACEMENT  06/10/2021  ? Procedure: CYSTOSCOPY WITH LEFT STENT PLACEMENT;  Surgeon: Dwan Bolt, MD;  [facility];  Service: General;;  ? LAPAROTOMY N/A 06/19/2021  ? Procedure: EXPLORATORY LAPAROTOMY;  Surgeon: Erroll Luna, MD;  Location: [facility];  Service: General;  Laterality: N/A;  ? ORIF CALCANEOUS FRACTURE Right 08/26/2021  ? Procedure: OPEN REDUCTION INTERNAL FIXATION (ORIF) CALCANEOUS FRACTURE;  Surgeon: Shona Needles, MD;  Location: [facility];  Service: Orthopedics;  Laterality: Right;  ? PATELLA FRACTURE SURGERY Left   ? scar tissue eye  1983  ? right  ? TOTAL KNEE ARTHROPLASTY Right 05/20/2021  ? Procedure: RIGHT TOTAL KNEE ARTHROPLASTY;  Surgeon: Mcarthur Rossetti, MD;  Location[facility];  Service: Orthopedics;  Laterality: Right;  Needs RNFA  ? TUBAL LIGATION  1985  ? WRIST FRACTURE SURGERY Right   ? ? ?Social History ? reports that she has been smoking cigarettes. She has a 9.00 pack-year smoking history. She has never used smokeless tobacco. She reports current drug use. Frequency: 7.00 times per week. Drug: Marijuana. She reports that she does not drink alcohol. ? ?Allergies  ?Allergen  Reactions  ? Contrast Media [Iodinated Contrast Media] Anaphylaxis  ? Iodine Anaphylaxis  ?  Pt states she is allergic to INTERNAL IODINE  ? Betadine [Povidone Iodine] Hives  ? Methocarbamol Nausea Only  ?  Felt nauseated and sick  ? Sulfa Antibiotics   ?  N/V  ? ? ?Family History  ?Problem Relation Age of Onset  ? Colon cancer Neg Hx   ? Stomach cancer Neg Hx   ? Esophageal cancer Neg Hx   ? Rectal cancer Neg Hx   ?Reviewed on admission ? ?Prior to Admission  medications   ?Medication Sig Start Date End Date Taking? Authorizing Provider  ?albuterol (VENTOLIN HFA) 108 (90 Base) MCG/ACT inhaler Inhale 2 puffs into the lungs every 6 (six) hours as needed for wheezing or shortness of breath.    [provider]  ?amphetamine-dextroamphetamine (ADDERALL) 20 MG tablet Take 20 mg by mouth daily. 08/22/21   [provider]  ?aspirin 81 MG chewable tablet Chew 1 tablet (81 mg total) by mouth 2 (two) times daily. 05/21/21   Mcarthur Rossetti, MD  ?carboxymethylcellulose (REFRESH PLUS) 0.5 % SOLN Place 1 drop into both eyes 3 (three) times daily as needed (dry eyes).    [provider]  ?docusate sodium (COLACE) 100 MG capsule Take 1 capsule (100 mg total) by mouth daily. 09/05/21   Winferd Humphrey, PA-C  ?escitalopram (LEXAPRO) 20 MG tablet Take 20 mg by mouth daily.    [provider]  ?gabapentin (NEURONTIN) 400 MG capsule Take 800 mg by mouth 3 (three) times daily.    [provider]  ?LORazepam (ATIVAN) 0.5 MG tablet Take 0.5-1 mg by mouth See admin instructions. 0.'5mg'$  in AM and '1mg'$  in evening 09/03/19   [provider]  ?metoprolol tartrate (LOPRESSOR) 25 MG tablet Take 12.5 mg by mouth daily. 12/27/20   [provider]  ?nystatin (MYCOSTATIN) 100000 UNIT/ML suspension Take 5 mLs by mouth 4 (four) times daily as needed (dry mouth). 02/04/21   [provider]  ?polyethylene glycol (MIRALAX / GLYCOLAX) 17 g packet Take 17 g by mouth daily as needed for mild constipation. 09/05/21   Winferd Humphrey, PA-C  ?predniSONE (DELTASONE) 10 MG tablet Take 10 mg by mouth daily with breakfast.    [provider]  ?senna-docusate (SENOKOT-S) 8.6-50 MG tablet Take 1 tablet by mouth at bedtime. 09/05/21   Winferd Humphrey, PA-C  ? ? ?Physical Exam: ?Vitals:  ? 02/01/22 2100 02/01/22 2116 02/01/22 2145 02/01/22 2245  ?BP: (!) 175/87  (!) 181/87 (!) 142/74  ?Pulse: 90  86 93  ?Resp: 15  14 (!) 24  ?Temp:  99  ?F (37.2 ?C)    ?TempSrc:  Rectal    ?SpO2: 100%  100% 98%  ? ? ?Physical Exam ?Constitutional:   ?   General: She is not in acute distress. ?   Appearance: Normal appearance.  ?HENT:  ?   Head: Normocephalic and atraumatic.  ?   Mouth/Throat:  ?   Mouth: Mucous membranes are moist.  ?   Pharynx: Oropharynx is clear.  ?Eyes:  ?   Extraocular Movements: Extraocular movements intact.  ?   Pupils: Pupils are equal, round, and reactive to light.  ?Cardiovascular:  ?   Rate and Rhythm: Normal rate and regular rhythm.  ?   Pulses: Normal pulses.  ?   Heart sounds: Normal heart sounds.  ?Pulmonary:  ?   Effort: Pulmonary effort is normal. No respiratory distress.  ?  Breath sounds: Normal breath sounds.  ?Abdominal:  ?   General: Bowel sounds are normal. There is no distension.  ?   Palpations: Abdomen is soft.  ?   Tenderness: There is no abdominal tenderness.  ?Musculoskeletal:     ?   General: No swelling or deformity.  ?Skin: ?   General: Skin is warm and dry.  ?Neurological:  ?   General: No focal deficit present.  ?   Mental Status: She is disoriented.  ?   Comments: Oriented to person only.  Repeat "thank you ", "only hurts a little ", "Myna Hidalgo"  ? ? ?Labs on Admission: I have personally reviewed following labs and imaging studies ? ?CBC: ?Recent Labs  ?Lab 02/01/22 ?2113 02/01/22 ?2120  ?WBC 9.3  --   ?NEUTROABS 7.0  --   ?HGB 13.4 15.3*  ?HCT 44.4 45.0  ?MCV 78.3*  --   ?PLT 313  --   ? ? ?Basic Metabolic Panel: ?Recent Labs  ?Lab 02/01/22 ?2113 02/01/22 ?2120  ?NA 142 141  ?K 4.8 4.4  ?CL 106  --   ?CO2 28  --   ?GLUCOSE 112*  --   ?BUN 28*  --   ?CREATININE 0.87  --   ?CALCIUM 9.2  --   ? ? ?GFR: ?CrCl cannot be calculated (Unknown ideal weight.). ? ?Liver Function Tests: ?Recent Labs  ?Lab 02/01/22 ?2113  ?AST 281*  ?ALT 238*  ?ALKPHOS 109  ?BILITOT 0.7  ?PROT 7.1  ?ALBUMIN 3.7  ? ? ?Urine analysis: ?   ?Component Value Date/Time  ? [place] YELLOW 02/01/2022 2010  ? APPEARANCEUR CLEAR 02/01/2022 2010  ?  LABSPEC 1.023 02/01/2022 2010  ? PHURINE 5.0 02/01/2022 2010  ? [place] NEGATIVE 02/01/2022 2010  ? HGBUR SMALL (A) 02/01/2022 2010  ? [place] NEGATIVE 02/01/2022 2010  ? [place] NEGATIVE 05/17/

## 2022-02-01 NOTE — ED Notes (Signed)
Provider made aware of pt condition ?

## 2022-02-01 NOTE — ED Triage Notes (Signed)
BIB [facility] from home. PD called out for welfare check. Pt LKW is sometime Tuesday. Pt was found on the floor with AMS. Pt was assisted back in bed by PD. When EMS arrived pt was in bed sleeping with AMS. Pt was alert to to person, follows command. Hematoma to rt side of [place] with iabrasions to rt arm. Decrease O2 sats in the 80's on room air. CBG 133 ?

## 2022-02-01 NOTE — ED Provider Notes (Signed)
[facility] EMERGENCY DEPARTMENT Provider Note   CSN: [id number] Arrival date & time: 02/01/22  1950     History  Chief Complaint  Patient presents with   Altered Mental Status    Brittany Hobbs is a 61 y.o. female.  The history is provided by the EMS personnel and medical records. No language interpreter was used.   This is a 61 year old female with a history of lupus, asthma, diverticular disease diverticulosis.  Brought here via EMS for evaluation of altered mental status.  History is limited due to patient's altered mental status.  EMS was called to the house after patient has a wellness check.  Her last known normal was several days prior.  She was last spoken phone with her daughter 3 days ago.  2 days ago neighbor saw her walking outside.  Today EMS came by and patient was clearly altered and unable to answer any question appropriately.  Patient brought here for further assessment.  Level 5 caveat due to altered mental status.  Home Medications Prior to Admission medications   Medication Sig Start Date End Date Taking? Authorizing Provider  albuterol (VENTOLIN HFA) 108 (90 Base) MCG/ACT inhaler Inhale 2 puffs into the lungs every 6 (six) hours as needed for wheezing or shortness of breath.    [provider]  amphetamine-dextroamphetamine (ADDERALL) 20 MG tablet Take 20 mg by mouth daily. 08/22/21   [provider]  aspirin 81 MG chewable tablet Chew 1 tablet (81 mg total) by mouth 2 (two) times daily. 05/21/21   Mcarthur Rossetti, MD  carboxymethylcellulose (REFRESH PLUS) 0.5 % SOLN Place 1 drop into both eyes 3 (three) times daily as needed (dry eyes).    [provider]  docusate sodium (COLACE) 100 MG capsule Take 1 capsule (100 mg total) by mouth daily. 09/05/21   Winferd Humphrey, PA-C  escitalopram (LEXAPRO) 20 MG tablet Take 20 mg by mouth daily.    [provider]  gabapentin (NEURONTIN) 400 MG capsule Take  800 mg by mouth 3 (three) times daily.    [provider]  LORazepam (ATIVAN) 0.5 MG tablet Take 0.5-1 mg by mouth See admin instructions. 0.'5mg'$  in AM and '1mg'$  in evening 09/03/19   [provider]  metoprolol tartrate (LOPRESSOR) 25 MG tablet Take 12.5 mg by mouth daily. 12/27/20   [provider]  nystatin (MYCOSTATIN) 100000 UNIT/ML suspension Take 5 mLs by mouth 4 (four) times daily as needed (dry mouth). 02/04/21   [provider]  polyethylene glycol (MIRALAX / GLYCOLAX) 17 g packet Take 17 g by mouth daily as needed for mild constipation. 09/05/21   Winferd Humphrey, PA-C  predniSONE (DELTASONE) 10 MG tablet Take 10 mg by mouth daily with breakfast.    [provider]  senna-docusate (SENOKOT-S) 8.6-50 MG tablet Take 1 tablet by mouth at bedtime. 09/05/21   Winferd Humphrey, PA-C      Allergies    Contrast media [iodinated contrast media], Iodine, Betadine [povidone iodine], Methocarbamol, and Sulfa antibiotics    Review of Systems   Review of Systems  Unable to perform ROS: Mental status change   Physical Exam Updated Vital Signs BP (!) 181/87   Pulse 86   Temp 99 F (37.2 C) (Rectal)   Resp 14   SpO2 100%  Physical Exam Vitals and nursing note reviewed.  Constitutional:      Appearance: She is well-developed.     Comments: This is an ill-appearing female  appears older than stated age clearly altered, answer every single questions with "okay, thank you"  HENT:     Head: Normocephalic.     Comments: Ecchymosis noted to right forehead nontender to palpation without crepitus.  No midface tenderness no malocclusion no hemotympanum and no septal hematoma    Mouth/Throat:     Mouth: Mucous membranes are dry.  Eyes:     Conjunctiva/sclera: Conjunctivae normal.  Cardiovascular:     Rate and Rhythm: Tachycardia present.  Pulmonary:     Effort: Pulmonary effort is normal.  Abdominal:     Palpations: Abdomen is soft.     Tenderness:  There is abdominal tenderness (Abdomen is tender to palpation.  Colostomy bag noted to left abdominal wall.).  Musculoskeletal:     Cervical back: Neck supple. No tenderness.     Comments: No midline spine step-off or tenderness noted  Skin:    Findings: No rash.     Comments: Abrasion noted to right anterior knee with normal flexion extension no deformity.  Abrasion noted to left posterior thigh.  Skin is cool to the touch but with intact distal radial and pedal pulses.  Neurological:     Mental Status: She is alert. She is disoriented.  Psychiatric:        Mood and Affect: Mood normal.    ED Results / Procedures / Treatments   Labs (all labs ordered are listed, but only abnormal results are displayed) Labs Reviewed  CBC WITH DIFFERENTIAL/PLATELET - Abnormal; Notable for the following components:      Result Value   RBC 5.67 (*)    MCV 78.3 (*)    MCH 23.6 (*)    RDW 19.3 (*)    All other components within normal limits  COMPREHENSIVE METABOLIC PANEL - Abnormal; Notable for the following components:   Glucose, Bld 112 (*)    BUN 28 (*)    AST 281 (*)    ALT 238 (*)    All other components within normal limits  URINALYSIS, ROUTINE W REFLEX MICROSCOPIC - Abnormal; Notable for the following components:   Hgb urine dipstick SMALL (*)    Protein, ur 100 (*)    All other components within normal limits  LACTIC ACID, PLASMA - Abnormal; Notable for the following components:   Lactic Acid, Venous 2.7 (*)    All other components within normal limits  RAPID URINE DRUG SCREEN, HOSP PERFORMED - Abnormal; Notable for the following components:   Amphetamines POSITIVE (*)    Tetrahydrocannabinol POSITIVE (*)    All other components within normal limits  D-DIMER, QUANTITATIVE - Abnormal; Notable for the following components:   D-Dimer, Quant 1.19 (*)    All other components within normal limits  CBG MONITORING, ED - Abnormal; Notable for the following components:   Glucose-Capillary  114 (*)    All other components within normal limits  I-STAT VENOUS BLOOD GAS, ED - Abnormal; Notable for the following components:   pO2, Ven 29 (*)    Bicarbonate 28.7 (*)    Calcium, Ion 1.13 (*)    Hemoglobin 15.3 (*)    All other components within normal limits  RESP PANEL BY RT-PCR (FLU A&B, COVID) ARPGX2  AMMONIA  ETHANOL  HEPATITIS PANEL, ACUTE  MAGNESIUM  TSH  BRAIN NATRIURETIC PEPTIDE  COMPREHENSIVE METABOLIC PANEL  CBC  CBG MONITORING, ED    EKG None ED ECG REPORT   Date: 02/01/2022  Rate: 89  Rhythm: normal sinus rhythm  QRS Axis: right  Intervals: normal  ST/T Wave abnormalities: normal  Conduction Disutrbances:none  Narrative Interpretation:   Old EKG Reviewed: unchanged  I have personally reviewed the EKG tracing and agree with the computerized printout as noted.   Radiology CT Head Wo Contrast  Result Date: 02/01/2022 CLINICAL DATA:  Head trauma. EXAM: CT HEAD WITHOUT CONTRAST TECHNIQUE: Contiguous axial images were obtained from the base of the skull through the vertex without intravenous contrast. RADIATION DOSE REDUCTION: This exam was performed according to the departmental dose-optimization program which includes automated exposure control, adjustment of the mA and/or kV according to patient size and/or use of iterative reconstruction technique. COMPARISON:  Head CT dated 08/25/2021. FINDINGS: Brain: The ventricles and sulci are appropriate size for the patient's age. The gray-white matter discrimination is preserved. There is no acute intracranial hemorrhage. No mass effect or midline shift. No extra-axial fluid collection. Vascular: No hyperdense vessel or unexpected calcification. Skull: Normal. Negative for fracture or focal lesion. Sinuses/Orbits: No acute finding. Other: None IMPRESSION: No acute intracranial pathology. Electronically Signed   By: Anner Crete M.D.   On: 02/01/2022 21:38   DG Chest Portable 1 View  Result Date:  02/01/2022 CLINICAL DATA:  Altered mental status. EXAM: PORTABLE CHEST 1 VIEW COMPARISON:  Chest x-ray 08/25/2021. FINDINGS: Cardiac silhouette is enlarged. There is diffuse interstitial prominence and pulmonary vascular congestion. There is some strandy and patchy opacities in both lower lungs. Costophrenic angles are clear. No pneumothorax or acute fracture. IMPRESSION: 1. Cardiomegaly with mild edema pattern. 2. Patchy opacities in the lower lungs may represent edema or infection. Electronically Signed   By: Ronney Asters M.D.   On: 02/01/2022 20:51    Procedures .Critical Care Performed by: Domenic Moras, PA-C Authorized by: Domenic Moras, PA-C   Critical care provider statement:    Critical care time (minutes):  45   Critical care was time spent personally by me on the following activities:  Development of treatment plan with patient or surrogate, discussions with consultants, evaluation of patient's response to treatment, examination of patient, ordering and review of laboratory studies, ordering and review of radiographic studies, ordering and performing treatments and interventions, pulse oximetry, re-evaluation of patient's condition and review of old charts    Medications Ordered in ED Medications  azithromycin (ZITHROMAX) 500 mg in sodium chloride 0.9 % 250 mL IVPB (500 mg Intravenous New Bag/Given 02/01/22 2214)  cefTRIAXone (ROCEPHIN) 1 g in sodium chloride 0.9 % 100 mL IVPB (0 g Intravenous Stopped 02/01/22 2214)    ED Course/ Medical Decision Making/ A&P                           Medical Decision Making Amount and/or Complexity of Data Reviewed Labs: ordered. Radiology: ordered.   BP (!) 175/87   Pulse 90   Temp 99 F (37.2 C) (Rectal)   Resp 15   SpO2 100%   8:09 PM This is a 61 year old female significant history of lupus, diverticulitis, intra-abdominal abscess status post colostomy, brought here via EMS from home for evaluation of altered mental status.  Last known  normal was approximately 3 days ago when she was on the phone with her daughter.  On today's exam, patient is altered, unable to answer any question appropriately.  She  respond by saying the same "okay, thank you".  She is overall ill-appearing, she has ecchymosis to her right forehead suggestive of head injury.  Will obtain head CT scan.  She was found to  be hypoxic with O2 sats in the 80s, she was given supplemental oxygen with steady improvement.  She does have multiple abrasion to her knee and leg but able to range her lower extremity without difficulty.  She is able to move all 4 extremities.  She had a colostomy bag in her abdominal region and does have some tenderness to palpation of abdomen.  Work-up initiated.  I have attempted to reach out to patient's family using phone numbers available but unable to reach in touch with anyone.  9:25 PM Chest x-ray independently visualized and interpreted by me and I agree with radiologist interpretation.  There are patchy opacities in the lower lungs which may represent edema or infection.  In the setting of hypoxia, and confusion I am concerning for underlying infection causing presentation.  I have initiated antibiotic which includes Rocephin and Zithromax.  Lactic acid is currently pending.  Fortunately her blood gas shows a pH of 7.32.  Will monitor closely.  I have consider PE as a potential cause however patient is allergic to IV contrast dye.  D-dimer ordered  10:50 PM Unfortunately D-dimer is elevated at 1.19.  UDS shows positive amphetamine and tetrahydrocannabinol.  EtOH is currently pending.  A CT scan independently viewed interpreted by me and shows no acute intracranial abnormalities.  EKG shows sinus rhythm with right axis deviation.  Patient also has evidence of transaminitis with AST 281, ALT 238.  Hepatitis panel sent.  She has normal ammonia level therefore confusion is less likely to be hepatic encephalopathy.  11:17 PM Appreciate  consultation from [facility] hospitalist, Dr. Trilby Drummer, who agrees to see and will admit patient for further work-up.  This patient presents to the ED for concern of AMS, this involves an extensive number of treatment options, and is a complaint that carries with it a high risk of complications and morbidity.  The differential diagnosis includes delirium, traumatic brain injury, stroke, electrolytes derangement, infection, anemia  Co morbidities that complicate the patient evaluation abd infection  Lupus   Additional history obtained:  Additional history obtained from EMS External records from outside source obtained and reviewed including previous note from [facility] medicine  Lab Tests:  I Ordered, and personally interpreted labs.  The pertinent results include:  as above  Imaging Studies ordered:  I ordered imaging studies including CXR I independently visualized and interpreted imaging which showed patchy opacities I agree with the radiologist interpretation  Cardiac Monitoring:  The patient was maintained on a cardiac monitor.  I personally viewed and interpreted the cardiac monitored which showed an underlying rhythm of: NSR  Medicines ordered and prescription drug management:  I ordered medication including Rocephin/zithromax  for pneumonia Reevaluation of the patient after these medicines showed that the patient improved I have reviewed the patients home medicines and have made adjustments as needed  Test Considered: as above  Critical Interventions: IV abx  IVF  Sepsis protocol  Consultations Obtained:  I requested consultation with the hospitalist Dr. Trilby Drummer,  and discussed lab and imaging findings as well as pertinent plan - they recommend: admission  Problem List / ED Course: sepsis  Pneumonia  Transaminitis  Delirium    Reevaluation:  After the interventions noted above, I reevaluated the patient and found that they have :stayed the same  Social Determinants  of Health: tobacco use  Dispostion:  After consideration of the diagnostic results and the patients response to treatment, I feel that the patent would benefit from admission.  Final Clinical Impression(s) / ED Diagnoses Final diagnoses:  Acute encephalopathy  Sepsis, due to unspecified organism, unspecified whether acute organ dysfunction present [facility])  Community acquired pneumonia, unspecified laterality  Transaminitis    Rx / DC Orders ED Discharge Orders     None         Domenic Moras, PA-C 02/01/22 2321    Fredia Sorrow, MD 02/03/22 1733

## 2022-02-01 NOTE — ED Notes (Signed)
Provider at bedside

## 2022-02-02 ENCOUNTER — Observation Stay (HOSPITAL_COMMUNITY): Payer: 59

## 2022-02-02 ENCOUNTER — Inpatient Hospital Stay (HOSPITAL_COMMUNITY): Payer: 59

## 2022-02-02 DIAGNOSIS — R4182 Altered mental status, unspecified: Secondary | ICD-10-CM | POA: Diagnosis present

## 2022-02-02 DIAGNOSIS — Z933 Colostomy status: Secondary | ICD-10-CM | POA: Diagnosis not present

## 2022-02-02 DIAGNOSIS — Z888 Allergy status to other drugs, medicaments and biological substances status: Secondary | ICD-10-CM | POA: Diagnosis not present

## 2022-02-02 DIAGNOSIS — Z882 Allergy status to sulfonamides status: Secondary | ICD-10-CM | POA: Diagnosis not present

## 2022-02-02 DIAGNOSIS — J189 Pneumonia, unspecified organism: Secondary | ICD-10-CM

## 2022-02-02 DIAGNOSIS — Z7952 Long term (current) use of systemic steroids: Secondary | ICD-10-CM | POA: Diagnosis not present

## 2022-02-02 DIAGNOSIS — J9601 Acute respiratory failure with hypoxia: Secondary | ICD-10-CM

## 2022-02-02 DIAGNOSIS — M6282 Rhabdomyolysis: Secondary | ICD-10-CM | POA: Diagnosis not present

## 2022-02-02 DIAGNOSIS — F419 Anxiety disorder, unspecified: Secondary | ICD-10-CM | POA: Diagnosis present

## 2022-02-02 DIAGNOSIS — F902 Attention-deficit hyperactivity disorder, combined type: Secondary | ICD-10-CM | POA: Diagnosis present

## 2022-02-02 DIAGNOSIS — R7989 Other specified abnormal findings of blood chemistry: Secondary | ICD-10-CM | POA: Diagnosis not present

## 2022-02-02 DIAGNOSIS — S0083XA Contusion of other part of head, initial encounter: Secondary | ICD-10-CM | POA: Diagnosis not present

## 2022-02-02 DIAGNOSIS — G928 Other toxic encephalopathy: Secondary | ICD-10-CM | POA: Diagnosis not present

## 2022-02-02 DIAGNOSIS — S70312A Abrasion, left thigh, initial encounter: Secondary | ICD-10-CM | POA: Diagnosis present

## 2022-02-02 DIAGNOSIS — G9340 Encephalopathy, unspecified: Secondary | ICD-10-CM | POA: Diagnosis not present

## 2022-02-02 DIAGNOSIS — Z7982 Long term (current) use of aspirin: Secondary | ICD-10-CM | POA: Diagnosis not present

## 2022-02-02 DIAGNOSIS — F17210 Nicotine dependence, cigarettes, uncomplicated: Secondary | ICD-10-CM | POA: Diagnosis present

## 2022-02-02 DIAGNOSIS — Z20822 Contact with and (suspected) exposure to covid-19: Secondary | ICD-10-CM | POA: Diagnosis not present

## 2022-02-02 DIAGNOSIS — J690 Pneumonitis due to inhalation of food and vomit: Secondary | ICD-10-CM | POA: Diagnosis not present

## 2022-02-02 DIAGNOSIS — I674 Hypertensive encephalopathy: Secondary | ICD-10-CM | POA: Diagnosis not present

## 2022-02-02 DIAGNOSIS — M329 Systemic lupus erythematosus, unspecified: Secondary | ICD-10-CM | POA: Diagnosis not present

## 2022-02-02 DIAGNOSIS — F32A Depression, unspecified: Secondary | ICD-10-CM | POA: Diagnosis not present

## 2022-02-02 DIAGNOSIS — I10 Essential (primary) hypertension: Secondary | ICD-10-CM | POA: Diagnosis present

## 2022-02-02 DIAGNOSIS — Z91041 Radiographic dye allergy status: Secondary | ICD-10-CM | POA: Diagnosis not present

## 2022-02-02 DIAGNOSIS — S80211A Abrasion, right knee, initial encounter: Secondary | ICD-10-CM | POA: Diagnosis present

## 2022-02-02 DIAGNOSIS — R69 Illness, unspecified: Secondary | ICD-10-CM | POA: Diagnosis not present

## 2022-02-02 DIAGNOSIS — I517 Cardiomegaly: Secondary | ICD-10-CM

## 2022-02-02 DIAGNOSIS — G4089 Other seizures: Secondary | ICD-10-CM | POA: Diagnosis not present

## 2022-02-02 DIAGNOSIS — E785 Hyperlipidemia, unspecified: Secondary | ICD-10-CM | POA: Diagnosis not present

## 2022-02-02 DIAGNOSIS — J45909 Unspecified asthma, uncomplicated: Secondary | ICD-10-CM | POA: Diagnosis not present

## 2022-02-02 DIAGNOSIS — T426X5A Adverse effect of other antiepileptic and sedative-hypnotic drugs, initial encounter: Secondary | ICD-10-CM | POA: Diagnosis present

## 2022-02-02 LAB — COMPREHENSIVE METABOLIC PANEL
ALT: 203 U/L — ABNORMAL HIGH (ref 0–44)
AST: 208 U/L — ABNORMAL HIGH (ref 15–41)
Albumin: 3.4 g/dL — ABNORMAL LOW (ref 3.5–5.0)
Alkaline Phosphatase: 98 U/L (ref 38–126)
Anion gap: 9 (ref 5–15)
BUN: 25 mg/dL — ABNORMAL HIGH (ref 6–20)
CO2: 25 mmol/L (ref 22–32)
Calcium: 9 mg/dL (ref 8.9–10.3)
Chloride: 108 mmol/L (ref 98–111)
Creatinine, Ser: 0.67 mg/dL (ref 0.44–1.00)
GFR, Estimated: >60 mL/min (ref >60)
Glucose, Bld: 97 mg/dL (ref 70–99)
Potassium: 3.9 mmol/L (ref 3.5–5.1)
Sodium: 142 mmol/L (ref 135–145)
Total Bilirubin: 0.6 mg/dL (ref 0.3–1.2)
Total Protein: 6.6 g/dL (ref 6.5–8.1)

## 2022-02-02 LAB — BRAIN NATRIURETIC PEPTIDE: B Natriuretic Peptide: 665.3 pg/mL — ABNORMAL HIGH (ref 0.0–100.0)

## 2022-02-02 LAB — CBC
HCT: 40.4 % (ref 36.0–46.0)
Hemoglobin: 12.3 g/dL (ref 12.0–15.0)
MCH: 23.7 pg — ABNORMAL LOW (ref 26.0–34.0)
MCHC: 30.4 g/dL (ref 30.0–36.0)
MCV: 77.8 fL — ABNORMAL LOW (ref 80.0–100.0)
Platelets: 279 10*3/uL (ref 150–400)
RBC: 5.19 MIL/uL — ABNORMAL HIGH (ref 3.87–5.11)
RDW: 18.7 % — ABNORMAL HIGH (ref 11.5–15.5)
WBC: 7.1 10*3/uL (ref 4.0–10.5)
nRBC: 0 % (ref 0.0–0.2)

## 2022-02-02 LAB — HEPATITIS PANEL, ACUTE
HCV Ab: NONREACTIVE
Hep A IgM: NONREACTIVE
Hep B C IgM: NONREACTIVE
Hepatitis B Surface Ag: NONREACTIVE

## 2022-02-02 LAB — RESPIRATORY PANEL BY PCR

## 2022-02-02 LAB — CK: Total CK: 2552 U/L — ABNORMAL HIGH (ref 38–234)

## 2022-02-02 LAB — RESP PANEL BY RT-PCR (FLU A&B, COVID) ARPGX2
Influenza A by PCR: NEGATIVE
Influenza B by PCR: NEGATIVE
SARS Coronavirus 2 by RT PCR: NEGATIVE

## 2022-02-02 LAB — ETHANOL: Alcohol, Ethyl (B): <10 mg/dL (ref <10)

## 2022-02-02 LAB — ECHOCARDIOGRAM COMPLETE
AR max vel: 2.65 cm2
AV Peak grad: 10.6 mmHg
Ao pk vel: 1.63 m/s
Area-P 1/2: 3.71 cm2
S' Lateral: 1.9 cm

## 2022-02-02 LAB — MAGNESIUM: Magnesium: 1.9 mg/dL (ref 1.7–2.4)

## 2022-02-02 LAB — TSH: TSH: 2.208 u[IU]/mL (ref 0.350–4.500)

## 2022-02-02 LAB — LACTIC ACID, PLASMA
Lactic Acid, Venous: 1.4 mmol/L (ref 0.5–1.9)
Lactic Acid, Venous: 1 mmol/L (ref 0.5–1.9)

## 2022-02-02 LAB — PROCALCITONIN
Procalcitonin: <0.1 ng/mL
Procalcitonin: <0.1 ng/mL

## 2022-02-02 LAB — CBG MONITORING, ED: Glucose-Capillary: 106 mg/dL — ABNORMAL HIGH (ref 70–99)

## 2022-02-02 MED ORDER — LORAZEPAM 2 MG/ML IJ SOLN
1.0000 mg | Freq: Four times a day (QID) | INTRAMUSCULAR | Status: DC | PRN
  Administered 2022-02-03 (×2): 1 mg via INTRAVENOUS
  Filled 2022-02-02 (×3): qty 1

## 2022-02-02 MED ORDER — LABETALOL HCL 5 MG/ML IV SOLN: 5.0000 mg | INTRAVENOUS | Status: DC | PRN

## 2022-02-02 MED ORDER — LORAZEPAM 2 MG/ML IJ SOLN
2.0000 mg | Freq: Once | INTRAMUSCULAR | Status: DC
  Filled 2022-02-02: qty 1

## 2022-02-02 MED ORDER — SODIUM CHLORIDE 0.9 % IV SOLN: INTRAVENOUS | Status: DC

## 2022-02-02 MED ORDER — ACETAMINOPHEN 325 MG PO TABS
650.0000 mg | ORAL_TABLET | Freq: Four times a day (QID) | ORAL | Status: DC | PRN
  Administered 2022-02-03 – 2022-02-05 (×3): 650 mg via ORAL
  Filled 2022-02-02 (×3): qty 2

## 2022-02-02 MED ORDER — PREDNISONE 20 MG PO TABS
20.0000 mg | ORAL_TABLET | Freq: Every day | ORAL | Status: DC
  Administered 2022-02-02: 20 mg via ORAL
  Filled 2022-02-02 (×2): qty 1

## 2022-02-02 MED ORDER — SODIUM CHLORIDE 0.9 % IV SOLN
3.0000 g | Freq: Four times a day (QID) | INTRAVENOUS | Status: DC
  Administered 2022-02-02 – 2022-02-06 (×15): 3 g via INTRAVENOUS
  Filled 2022-02-02 (×17): qty 8

## 2022-02-02 MED ORDER — HYDRALAZINE HCL 20 MG/ML IJ SOLN
10.0000 mg | Freq: Four times a day (QID) | INTRAMUSCULAR | Status: DC | PRN
Start: 2022-02-02 — End: 2022-02-03
  Administered 2022-02-03: 10 mg via INTRAVENOUS
  Filled 2022-02-02: qty 1

## 2022-02-02 MED ORDER — HYDRALAZINE HCL 20 MG/ML IJ SOLN: 5.0000 mg | INTRAMUSCULAR | Status: DC | PRN

## 2022-02-02 NOTE — Procedures (Signed)
Patient Name: Brittany Hobbs  MRN: [id number]  Epilepsy Attending: Lora Havens  Referring Physician/Provider: Jonetta Osgood, MD Date: 02/02/2022 Duration: 22.06 mins  Patient history: 61 year old female with altered mental status.  EEG to evaluate for seizure.  Level of alertness: Awake  AEDs during EEG study: None  Technical aspects: This EEG study was done with scalp electrodes positioned according to the 10-20 International system of electrode placement. Electrical activity was acquired at a sampling rate of '500Hz'$  and reviewed with a high frequency filter of '70Hz'$  and a low frequency filter of '1Hz'$ . EEG data were recorded continuously and digitally stored.   Description: No clear posterior dominant rhythm was seen. EEG showed continuous generalized 3 to 5 Hz theta-delta slowing.  Generalized periodic discharges with triphasic morphology at 1.'5Hz'$  were also noted. Hyperventilation and photic stimulation were not performed.     ABNORMALITY - Periodic discharges with triphasic morphology, generalized ( GPDs) - Continuous slow, generalized  IMPRESSION: This study showed generalized periodic discharges with triphasic morphology which can be on the ictal-interictal continuum.  However the frequency and morphology of discharges is most likely indicative of toxic-metabolic causes.  Additionally there is moderate diffuse encephalopathy, nonspecific to etiology.  No seizures were seen throughout the recording.  Tajia Szeliga Barbra Sarks

## 2022-02-02 NOTE — ED Notes (Signed)
Verbal report from given to Danise Mina RN
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
## 2022-02-02 NOTE — Progress Notes (Signed)
   02/02/22 1408  Assess: MEWS Score  Temp (!) 96.4 F (35.8 C)  BP (!) 168/78  Pulse Rate 68  ECG Heart Rate 70  Resp 19  Level of Consciousness Responds to Voice (sleeping/confused)  SpO2 96 %  O2 Device Nasal Cannula  O2 Flow Rate (L/min) 2 L/min  Assess: MEWS Score  MEWS Temp 1  MEWS Systolic 0  MEWS Pulse 0  MEWS RR 0  MEWS LOC 1  MEWS Score 2  MEWS Score Color Yellow  Assess: if the MEWS score is Yellow or Red  Were vital signs taken at a resting state? Yes  Focused Assessment No change from prior assessment  Early Detection of Sepsis Score *See Row Information* Low  MEWS guidelines implemented *See Row Information* Yes  Treat  MEWS Interventions Administered scheduled meds/treatments  Pain Scale Faces  Faces Pain Scale 0  Take Vital Signs  Increase Vital Sign Frequency  Yellow: Q 2hr X 2 then Q 4hr X 2, if remains yellow, continue Q 4hrs  Escalate  MEWS: Escalate Yellow: discuss with charge nurse/RN and consider discussing with provider and RRT  Notify: Charge Nurse/RN  Name of Charge Nurse/RN Notified Sarah, RN  Date Charge Nurse/RN Notified 02/02/22  Time Charge Nurse/RN Notified 1600  Notify: Provider  Provider Name/Title Shanker/MD  Date Provider Notified 02/02/22  Time Provider Notified 1606  Method of Notification  (Secure chat)  Notification Reason Change in status  Provider response See new orders  Date of Provider Response 02/02/22  Time of Provider Response [id number]  Document  Patient Outcome Stabilized after interventions  Progress note created (see row info) Yes

## 2022-02-02 NOTE — Progress Notes (Signed)
Pharmacy Antibiotic Note  Brittany Hobbs is a 61 y.o. female admitted on 02/01/2022 with aspiration pneumonia.  Pharmacy has been consulted for ampicillin-sulbactam dosing.  WBC 7.1, Tmax 99, LA 2.7 > 1.0, RR 12 O2 Sat: 96% on 2L [place] - weaning  Plan: Ampicillin-Sulbactam 3g IV q6h Monitor WBC, SCr, temp, and clinical s/sx of infection F/u blood cultures and de-escalate antibiotics as indicated    Temp (24hrs), Avg:99 F (37.2 C), Min:99 F (37.2 C), Max:99 F (37.2 C)  Recent Labs  Lab 02/01/22 2113 02/01/22 2115 02/02/22 0040 02/02/22 0240  WBC 9.3  --   --  7.1  CREATININE 0.87  --   --  0.67  LATICACIDVEN  --  2.7* 1.4 1.0    CrCl cannot be calculated (Unknown ideal weight.).    Allergies  Allergen Reactions   Contrast Media [Iodinated Contrast Media] Anaphylaxis   Iodine Anaphylaxis    Pt states she is allergic to INTERNAL IODINE   Betadine [Povidone Iodine] Hives   Methocarbamol Nausea Only    Felt nauseated and sick   Sulfa Antibiotics     N/V    Antimicrobials this admission: Ceftriaxone 5/17 x1 Azithromycin 5/17 x1 Amp-sulbactam 5/18 >>   Dose adjustments this admission: N/A  Microbiology results: 5/18 BCx: pending  Thank you for allowing pharmacy to be a part of this patient's care.  Kaleen Mask 02/02/2022 9:13 AM

## 2022-02-02 NOTE — Progress Notes (Signed)
  Transition of Care [facility]) Screening Note   Patient Details  Name: Brittany Hobbs Date of Birth: 1961/02/19   Transition of [facility]) CM/SW Contact:    Milas Gain, [place] Phone Number: 02/02/2022, 4:37 PM    Transition of Care Department [facility]) has reviewed patient and no TOC needs have been identified at this time. We will continue to monitor patient advancement through interdisciplinary progression rounds. If new patient transition needs arise, please place a TOC consult.

## 2022-02-02 NOTE — Consult Note (Signed)
Neurology Consultation Reason for Consult: ams Referring Physician: Dr Oren Binet  CC: ams  History is obtained from: chart review as patient is confused  HPI: Brittany Hobbs is a 61 y.o. female  with PMH of lupus, asthma, HLD who was brought in yesterday after she was found confused at home during welfare check. Per EMS patient's O2 sats were in 80s. She also had multiple bruises, one specifically on right forehead. Last know normal was on 01/31/2022 when she was seen walking by a neighbor. In ER, BP was 181/87, BG was 114. Patient was admitted on medicine service and Neurology was consulted today due to persistent ams.   Workup summary: ABG with pH 7.32,BUN 28 with normal creat, AST 281, ALT  238, normal ammonia. UDS positive for amphetamine ( has prescription for adderall) as well as THC, dDimer was 1.19, lactate 2.7. ETOH was negative, UA bland, no leucocytosis,  normal electrolytes, normal TSH, normal procal.   CXR showed  patchy opacities in the lower lungs which may represent edema or infection.   [place] wo contrast 02/01/2022: No acute intracranial pathology.   ROS: Unable to obtain due to altered mental status.   Past Medical History:  Diagnosis Date   Arthritis    Asthma    Diverticulitis    Hyperlipidemia    Lupus (HCC)    Panic attacks    Pneumonia 09/2020   with covid   PONV (postoperative nausea and vomiting)    Seasonal allergies     Family History  Problem Relation Age of Onset   Colon cancer Neg Hx    Stomach cancer Neg Hx    Esophageal cancer Neg Hx    Rectal cancer Neg Hx     Social History:  reports that she has been smoking cigarettes. She has a 9.00 pack-year smoking history. She has never used smokeless tobacco. She reports current drug use. Frequency: 7.00 times per week. Drug: Marijuana. She reports that she does not drink alcohol.  Medications Prior to Admission  Medication Sig Dispense Refill Last Dose   albuterol (VENTOLIN HFA) 108 (90  Base) MCG/ACT inhaler Inhale 2 puffs into the lungs every 6 (six) hours as needed for wheezing or shortness of breath.      amphetamine-dextroamphetamine (ADDERALL) 20 MG tablet Take 20 mg by mouth daily.      aspirin 81 MG chewable tablet Chew 1 tablet (81 mg total) by mouth 2 (two) times daily. 30 tablet 0    carboxymethylcellulose (REFRESH PLUS) 0.5 % SOLN Place 1 drop into both eyes 3 (three) times daily as needed (dry eyes).      docusate sodium (COLACE) 100 MG capsule Take 1 capsule (100 mg total) by mouth daily. 10 capsule 0    escitalopram (LEXAPRO) 20 MG tablet Take 20 mg by mouth daily.      gabapentin (NEURONTIN) 400 MG capsule Take 800 mg by mouth 3 (three) times daily.      LORazepam (ATIVAN) 0.5 MG tablet Take 0.5-1 mg by mouth See admin instructions. 0.'5mg'$  in AM and '1mg'$  in evening      metoprolol tartrate (LOPRESSOR) 25 MG tablet Take 12.5 mg by mouth daily.      nystatin (MYCOSTATIN) 100000 UNIT/ML suspension Take 5 mLs by mouth 4 (four) times daily as needed (dry mouth).      ondansetron (ZOFRAN) 4 MG tablet Take 4 mg by mouth 3 (three) times daily as needed.      oxyCODONE (OXY IR/ROXICODONE) 5 MG immediate  release tablet Take 5 mg by mouth every 4 (four) hours as needed.      polyethylene glycol (MIRALAX / GLYCOLAX) 17 g packet Take 17 g by mouth daily as needed for mild constipation. 14 each 0    predniSONE (DELTASONE) 10 MG tablet Take 10 mg by mouth daily with breakfast.      senna-docusate (SENOKOT-S) 8.6-50 MG tablet Take 1 tablet by mouth at bedtime.         Exam: Current vital signs: BP (!) 166/76   Pulse 77   Temp (!) 96.6 F (35.9 C)   Resp 20   SpO2 92%  Vital signs in last 24 hours: Temp:  [96.4 F (35.8 C)-99 F (37.2 C)] 96.6 F (35.9 C) (05/18 1600) Pulse Rate:  [64-93] 77 (05/18 1600) Resp:  [10-24] 20 (05/18 1600) BP: (136-184)/(70-98) 166/76 (05/18 1600) SpO2:  [82 %-100 %] 92 % (05/18 1600)   Physical Exam  Constitutional: Appears  well-developed and well-nourished.  Neuro: awake, able to tell me her name but not able to answer any other questions, didn't  follow commands, keeps repeating " I am not feeling well" but unable to describe any further, PERLA, appears to track examiner in room, no apparent facial asymmetry, didn't participate in strength testing but was noted to be moving in bed spontaneously with antigravity strength in all extremities   I have reviewed labs in epic and the results pertinent to this consultation are: CBC:  Recent Labs  Lab 02/01/22 2113 02/01/22 2120 02/02/22 0240  WBC 9.3  --  7.1  NEUTROABS 7.0  --   --   HGB 13.4 15.3* 12.3  HCT 44.4 45.0 40.4  MCV 78.3*  --  77.8*  PLT 313  --  633    Basic Metabolic Panel:  Lab Results  Component Value Date   NA 142 02/02/2022   K 3.9 02/02/2022   CO2 25 02/02/2022   GLUCOSE 97 02/02/2022   BUN 25 (H) 02/02/2022   CREATININE 0.67 02/02/2022   CALCIUM 9.0 02/02/2022   GFRNONAA >60 02/02/2022   GFRAA >60 10/02/2019   Lipid Panel: No results found for: LDLCALC HgbA1c: No results found for: HGBA1C Urine Drug Screen:     Component Value Date/Time   LABOPIA NONE DETECTED 02/01/2022 2010   COCAINSCRNUR NONE DETECTED 02/01/2022 2010   LABBENZ NONE DETECTED 02/01/2022 2010   AMPHETMU POSITIVE (A) 02/01/2022 2010   THCU POSITIVE (A) 02/01/2022 2010   LABBARB NONE DETECTED 02/01/2022 2010    Alcohol Level     Component Value Date/Time   ETH <10 02/01/2022 2113     I have reviewed the images obtained:  CT Head without contrast 02/01/2022: No acute intracranial pathology.   ASSESSMENT/PLAN: 46y F who was found confused during wellness check.  Acute encephalopathy - Patient appears to be aphasic and perseverating. Ddx include stroke vs PRES ( was hypertensive on arrival) vs medications ( adderall and THC in UDS). Low suspicion for meningitis in absence of fever, neck stiffness - EEG showed triphasic  waves  Recommendations: - MR  Brain w and wo contrast to assess for stroke, PRES, meningeal enhancement - Goal BP SBP<160 until we can confirm if there is stroke or PRES on MRI - Agree with unasyn - avoid sedating meds - management of comorbidities per primary team  Thank you for allowing [place] to participate in the care of this patient. If you have any further questions, please contact  me or neurohospitalist.   Marcelle Overlie  [facility]

## 2022-02-02 NOTE — Progress Notes (Signed)
EEG complete - results pending 

## 2022-02-02 NOTE — Progress Notes (Signed)
Lower extremity venous bilateral study completed.   Please see CV Proc for preliminary results.   Fannye Myer, RDMS, RVT  

## 2022-02-03 ENCOUNTER — Inpatient Hospital Stay (HOSPITAL_COMMUNITY): Payer: 59

## 2022-02-03 LAB — COMPREHENSIVE METABOLIC PANEL
ALT: 170 U/L — ABNORMAL HIGH (ref 0–44)
AST: 138 U/L — ABNORMAL HIGH (ref 15–41)
Albumin: 3.3 g/dL — ABNORMAL LOW (ref 3.5–5.0)
Alkaline Phosphatase: 96 U/L (ref 38–126)
Anion gap: 12 (ref 5–15)
BUN: 21 mg/dL — ABNORMAL HIGH (ref 6–20)
CO2: 24 mmol/L (ref 22–32)
Calcium: 8.9 mg/dL (ref 8.9–10.3)
Chloride: 104 mmol/L (ref 98–111)
Creatinine, Ser: 0.68 mg/dL (ref 0.44–1.00)
GFR, Estimated: >60 mL/min (ref >60)
Glucose, Bld: 74 mg/dL (ref 70–99)
Potassium: 3.4 mmol/L — ABNORMAL LOW (ref 3.5–5.1)
Sodium: 140 mmol/L (ref 135–145)
Total Bilirubin: 1 mg/dL (ref 0.3–1.2)
Total Protein: 6.4 g/dL — ABNORMAL LOW (ref 6.5–8.1)

## 2022-02-03 LAB — CBC
HCT: 40.1 % (ref 36.0–46.0)
Hemoglobin: 12.3 g/dL (ref 12.0–15.0)
MCH: 23.3 pg — ABNORMAL LOW (ref 26.0–34.0)
MCHC: 30.7 g/dL (ref 30.0–36.0)
MCV: 75.9 fL — ABNORMAL LOW (ref 80.0–100.0)
Platelets: 259 10*3/uL (ref 150–400)
RBC: 5.28 MIL/uL — ABNORMAL HIGH (ref 3.87–5.11)
RDW: 18 % — ABNORMAL HIGH (ref 11.5–15.5)
WBC: 6.5 10*3/uL (ref 4.0–10.5)
nRBC: 0 % (ref 0.0–0.2)

## 2022-02-03 LAB — PROCALCITONIN: Procalcitonin: <0.1 ng/mL

## 2022-02-03 LAB — CK: Total CK: 1233 U/L — ABNORMAL HIGH (ref 38–234)

## 2022-02-03 IMAGING — MR MR HEAD W/O CM
13 series · 48 of 48 positions shown · non-contrast
Comparison: None Available.

CLINICAL DATA: Altered mental status

EXAM:
MRI HEAD WITHOUT CONTRAST
TECHNIQUE: Multiplanar, multiecho pulse sequences of the brain and surrounding
structures were obtained without intravenous contrast.

[Series 5: DWI · axial · 3.0mm · 0.88mm/px · z∈[-83,+70]mm · 9 of 104 slices shown (1 of 4)]
[im 1/104]
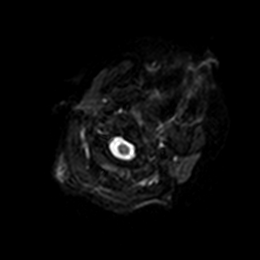
[im 13/104]
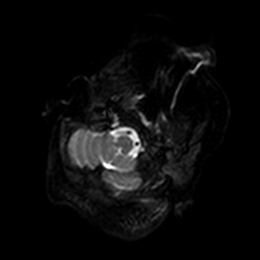
[im 26/104]
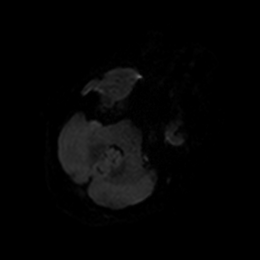
[im 39/104]
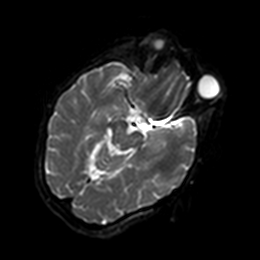
[im 52/104]
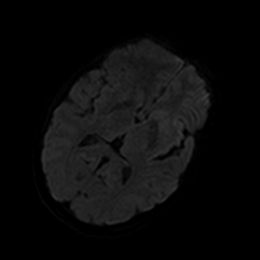
[im 65/104]
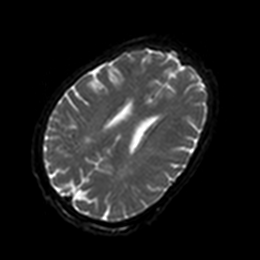
[im 78/104]
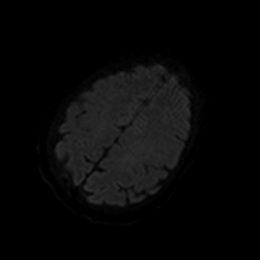
[im 91/104]
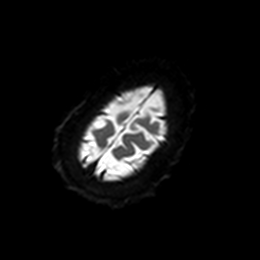
[im 104/104]
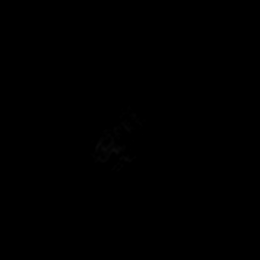

[Series 6: DWI · axial · 3.0mm · 0.88mm/px · z∈[-83,+70]mm · 4 of 52 slices shown (2 of 4)]
[im 1/52]
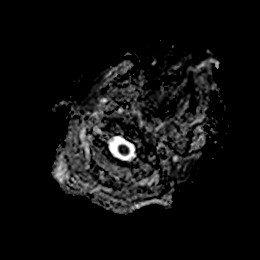
[im 18/52]
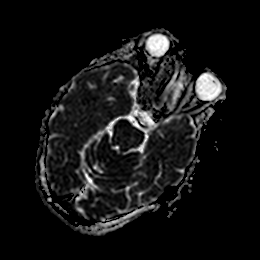
[im 35/52]
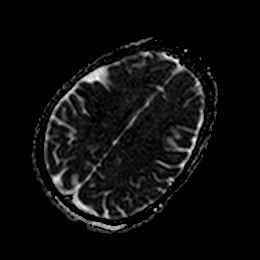
[im 52/52]
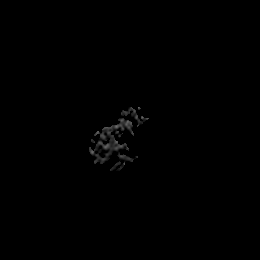

[Series 7: DWI · oblique · 4.0mm · 0.88mm/px · 6 of 74 slices shown (3 of 4)]
[im 1/74]
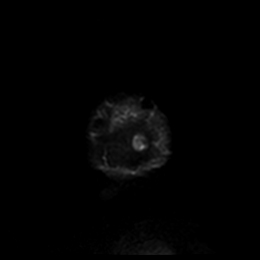
[im 15/74]
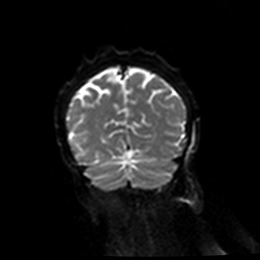
[im 30/74]
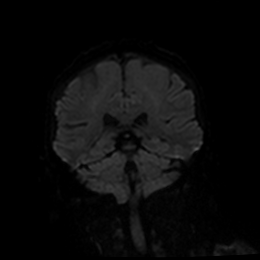
[im 44/74]
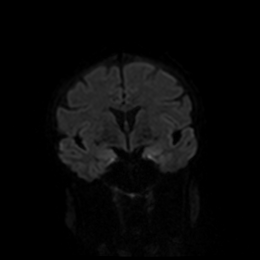
[im 59/74]
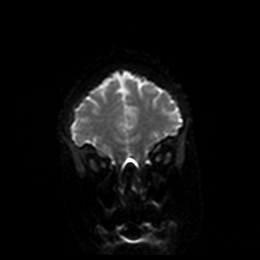
[im 74/74]
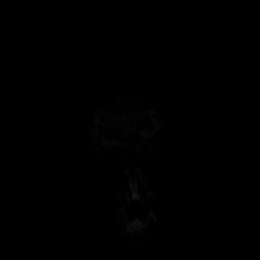

[Series 8: DWI · oblique · 4.0mm · 0.88mm/px · 3 of 36 slices shown (4 of 4)]
[im 1/36]
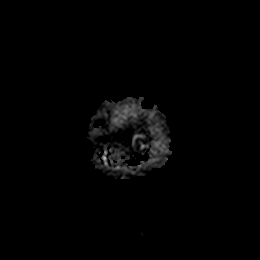
[im 18/36]
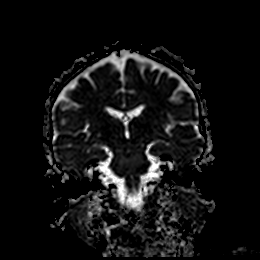
[im 36/36]
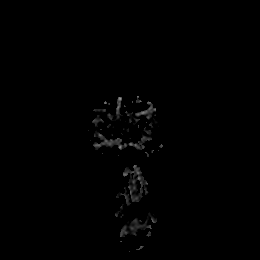

[Series 9: FLAIR · axial · 5.0mm · 0.45mm/px · z∈[-82,+67]mm · 2 of 26 slices shown (1 of 3)]
[im 1/26]
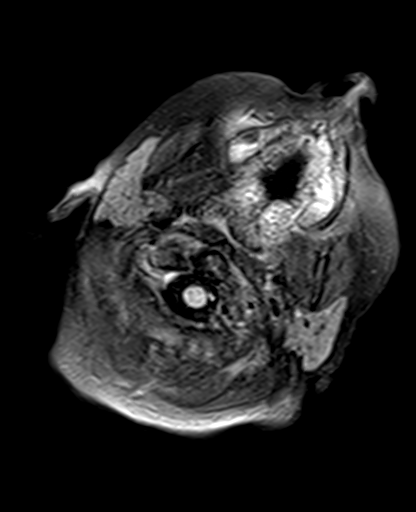
[im 26/26]
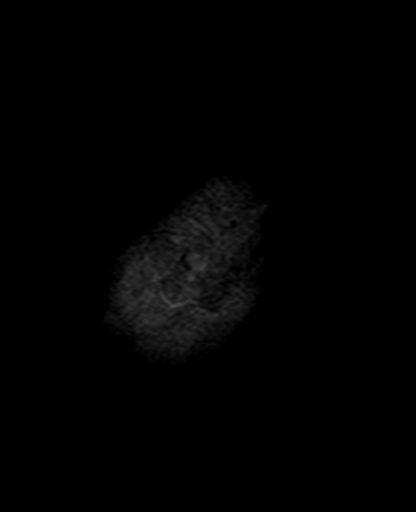

[Series 10: T2 · axial · 5.0mm · 0.72mm/px · z∈[-82,+68]mm · 2 of 26 slices shown]
[im 1/26]
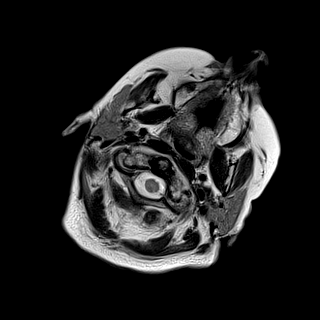
[im 26/26]
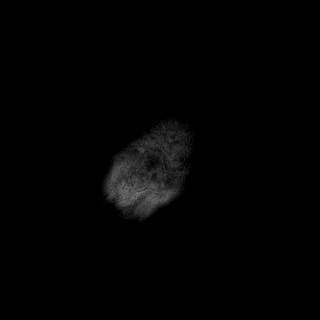

[Series 12: mag_images · axial · 3.0mm · 0.90mm/px · z∈[-84,+69]mm · 4 of 52 slices shown]
[im 1/52]
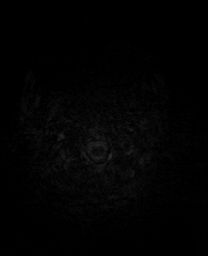
[im 18/52]
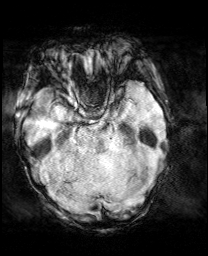
[im 35/52]
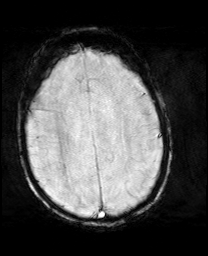
[im 52/52]
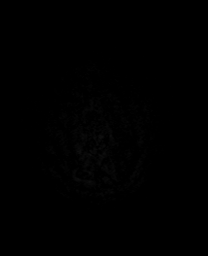

[Series 13: pha_images · axial · 3.0mm · 0.90mm/px · z∈[-81,+66]mm · 4 of 50 slices shown]
[im 1/50]
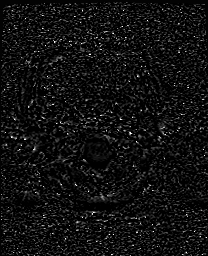
[im 17/50]
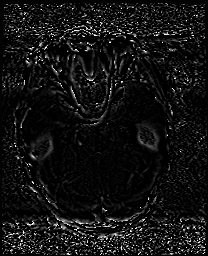
[im 33/50]
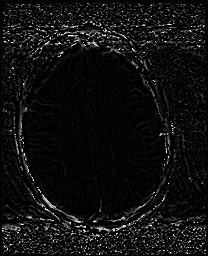
[im 50/50]
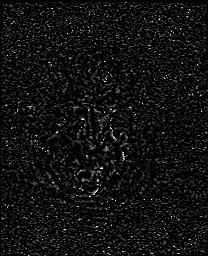

[Series 14: swi_images · axial · 3.0mm · 0.90mm/px · z∈[-84,+69]mm · 4 of 52 slices shown]
[im 1/52]
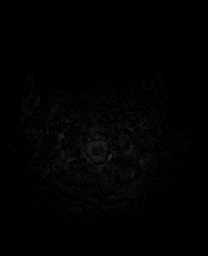
[im 18/52]
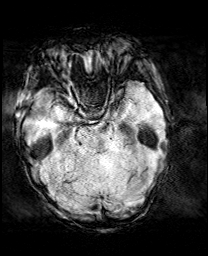
[im 35/52]
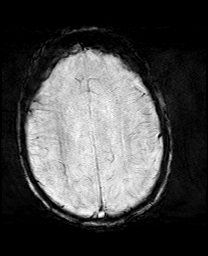
[im 52/52]
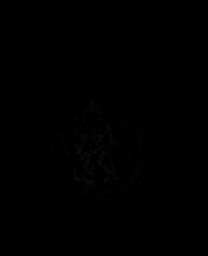

[Series 15: mip_images(sw) · axial · 24.0mm · 0.90mm/px · z∈[-73,+58]mm · 4 of 45 slices shown]
[im 1/45]
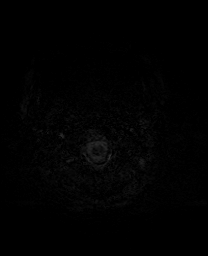
[im 15/45]
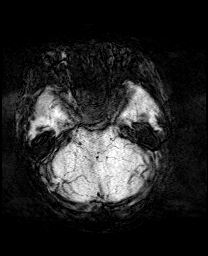
[im 30/45]
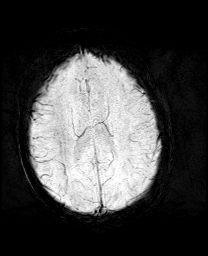
[im 45/45]
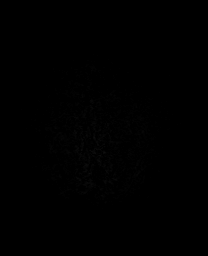

[Series 16: T1 · sagittal · 5.0mm · 0.75mm/px · 2 of 23 slices shown]
[im 1/23]
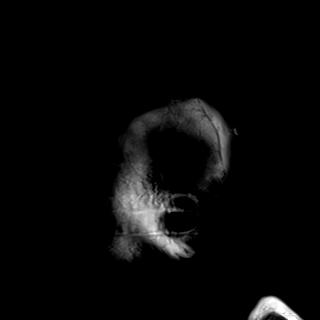
[im 23/23]
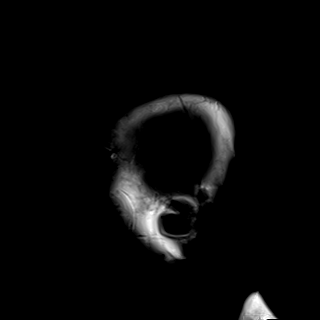

[Series 17: FLAIR · axial · 5.0mm · 0.90mm/px · z∈[-82,+68]mm · 2 of 26 slices shown (2 of 3)]
[im 1/26]
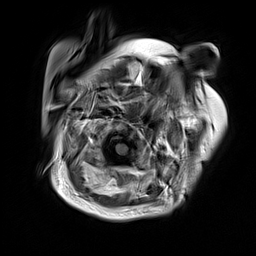
[im 26/26]
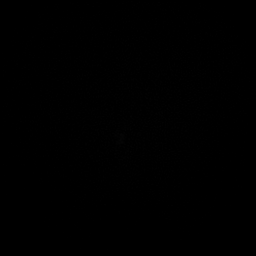

[Series 18: FLAIR · axial · 5.0mm · 0.90mm/px · z∈[-82,+68]mm · 2 of 26 slices shown (3 of 3)]
[im 1/26]
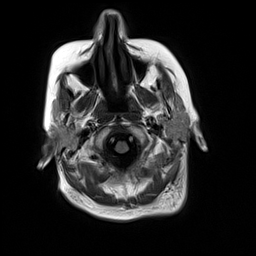
[im 26/26]
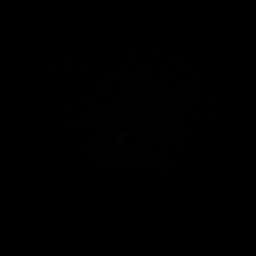

[48 of 48 positions shown; findings below may reference images not displayed]

FINDINGS: Brain: No acute infarct, mass effect or extra-axial collection. No
acute or chronic hemorrhage. There is multifocal hyperintense
T2-weighted signal within the white matter. Parenchymal volume and
CSF spaces are normal. The midline structures are normal.

Vascular: Major flow voids are preserved.

Skull and upper cervical spine: Normal calvarium and skull base.
Visualized upper cervical spine and soft tissues are normal.

Sinuses/Orbits:No paranasal sinus fluid levels or advanced mucosal
thickening. No mastoid or middle ear effusion. Normal orbits.
IMPRESSION: 1. No acute intracranial abnormality.
2. Mild chronic small vessel ischemia.

Intravenous contrast agent was not administered due to patient
motion and inability to cooperate with the technologist's
instructions.

## 2022-02-03 MED ORDER — LORAZEPAM 2 MG/ML IJ SOLN
2.0000 mg | Freq: Once | INTRAMUSCULAR | Status: AC
Start: 2022-02-03 — End: 2022-02-03
  Administered 2022-02-03: 2 mg via INTRAVENOUS
  Filled 2022-02-03: qty 1

## 2022-02-03 MED ORDER — METOPROLOL TARTRATE 25 MG PO TABS
25.0000 mg | ORAL_TABLET | Freq: Two times a day (BID) | ORAL | Status: DC
  Filled 2022-02-03: qty 1

## 2022-02-03 MED ORDER — METOPROLOL TARTRATE 5 MG/5ML IV SOLN
5.0000 mg | Freq: Four times a day (QID) | INTRAVENOUS | Status: DC
  Administered 2022-02-03 – 2022-02-06 (×8): 5 mg via INTRAVENOUS
  Filled 2022-02-03 (×8): qty 5

## 2022-02-03 MED ORDER — POTASSIUM CHLORIDE 10 MEQ/100ML IV SOLN
10.0000 meq | INTRAVENOUS | Status: AC
  Administered 2022-02-03: 10 meq via INTRAVENOUS
  Filled 2022-02-03: qty 100

## 2022-02-03 MED ORDER — POTASSIUM CHLORIDE 10 MEQ/100ML IV SOLN
INTRAVENOUS | Status: AC
  Administered 2022-02-03: 10 meq via INTRAVENOUS
  Filled 2022-02-03: qty 100

## 2022-02-03 MED ORDER — METHYLPREDNISOLONE SODIUM SUCC 40 MG IJ SOLR
20.0000 mg | INTRAMUSCULAR | Status: DC
  Administered 2022-02-03 – 2022-02-05 (×3): 20 mg via INTRAVENOUS
  Filled 2022-02-03 (×3): qty 1

## 2022-02-03 MED ORDER — HYDRALAZINE HCL 20 MG/ML IJ SOLN
10.0000 mg | Freq: Four times a day (QID) | INTRAMUSCULAR | Status: DC | PRN
  Administered 2022-02-04 (×2): 10 mg via INTRAVENOUS
  Filled 2022-02-03 (×2): qty 1

## 2022-02-03 MED ORDER — LABETALOL HCL 5 MG/ML IV SOLN
10.0000 mg | INTRAVENOUS | Status: DC | PRN
  Administered 2022-02-04 (×2): 10 mg via INTRAVENOUS
  Filled 2022-02-03: qty 4

## 2022-02-03 MED ORDER — POTASSIUM CHLORIDE CRYS ER 20 MEQ PO TBCR
40.0000 meq | EXTENDED_RELEASE_TABLET | Freq: Once | ORAL | Status: DC
  Filled 2022-02-03: qty 2

## 2022-02-03 NOTE — Progress Notes (Signed)
Regarding pt's order MRI. Able to obtain pre-contrast images but pt began moving continuously to point where imaging became non-diagnostic making administration of contrast unjustified. Order changed to Brain WO to reflect this. Exam completed as a limited study.

## 2022-02-03 NOTE — Progress Notes (Signed)
I was able to get the patient to take the slightest bit of AM medicine in apple sauce. Pt BP is currently 185/92 and heart rate of 63. Paged attending to determine next steps.

## 2022-02-03 NOTE — Progress Notes (Signed)
Attempted to administer oral medications three times this AM, just lost IV access.

## 2022-02-03 NOTE — Progress Notes (Addendum)
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 Limited examinations for reoccurring indications may be performed as noted. The reflux portion of the exam is performed with the patient in reverse Trendelenburg.  +---------+---------------+---------+-----------+----------+--------------+ RIGHT    CompressibilityPhasicitySpontaneityPropertiesThrombus Aging +---------+---------------+---------+-----------+----------+--------------+ CFV      Full           Yes      Yes                                 +---------+---------------+---------+-----------+----------+--------------+ SFJ      Full                                                        +---------+---------------+---------+-----------+----------+--------------+ FV Prox  Full                                                         +---------+---------------+---------+-----------+----------+--------------+ FV Mid   Full                                                        +---------+---------------+---------+-----------+----------+--------------+ FV DistalFull                                                        +---------+---------------+---------+-----------+----------+--------------+ PFV  Full                                                        +---------+---------------+---------+-----------+----------+--------------+ POP      Full           Yes      Yes                                 +---------+---------------+---------+-----------+----------+--------------+ PTV      Full                                                        +---------+---------------+---------+-----------+----------+--------------+ PERO     Full                                                        +---------+---------------+---------+-----------+----------+--------------+ Gastroc  Full                                                        +---------+---------------+---------+-----------+----------+--------------+   +---------+---------------+---------+-----------+----------+--------------+ LEFT     CompressibilityPhasicitySpontaneityPropertiesThrombus Aging +---------+---------------+---------+-----------+----------+--------------+ CFV      Full           Yes      Yes                                 +---------+---------------+---------+-----------+----------+--------------+ SFJ      Full                                                        +---------+---------------+---------+-----------+----------+--------------+ FV Prox  Full                                                        +---------+---------------+---------+-----------+----------+--------------+ FV Mid   Full                                                         +---------+---------------+---------+-----------+----------+--------------+ FV DistalFull                                                        +---------+---------------+---------+-----------+----------+--------------+  PFV      Full                                                        +---------+---------------+---------+-----------+----------+--------------+ POP      Full           Yes      Yes                                 +---------+---------------+---------+-----------+----------+--------------+ PTV      Full                                                        +---------+---------------+---------+-----------+----------+--------------+ PERO     Full                                                        +---------+---------------+---------+-----------+----------+--------------+ Gastroc  Full                                                        +---------+---------------+---------+-----------+----------+--------------+     Summary: RIGHT: - There is no evidence of deep vein thrombosis in the lower extremity.  - No cystic structure found in the popliteal fossa.  LEFT: - There is no evidence of deep vein thrombosis in the lower extremity.  - No cystic structure found in the popliteal fossa.  *See table(s) above for measurements and observations.    Preliminary      LOS: 1 day   Oren Binet, MD  [facility]    To contact the attending provider between 7A-7P or the covering provider during after hours 7P-7A, please log into the web site [email or web address] and access using universal [place] password for that web site. If you do not have the password, please call the hospital operator.  02/03/2022, 2:57 PM

## 2022-02-03 NOTE — Progress Notes (Signed)
LTM EEG hooked up and running - no initial skin breakdown - push button tested - neuro notified. [facility] monitoring.  

## 2022-02-03 NOTE — Progress Notes (Signed)
   02/03/22 1441  Safety Observation   Observer at bedside Yes

## 2022-02-04 ENCOUNTER — Encounter (HOSPITAL_COMMUNITY): Payer: Self-pay | Admitting: Internal Medicine

## 2022-02-04 ENCOUNTER — Other Ambulatory Visit: Payer: Self-pay

## 2022-02-04 DIAGNOSIS — G4089 Other seizures: Secondary | ICD-10-CM

## 2022-02-04 LAB — COMPREHENSIVE METABOLIC PANEL
ALT: 167 U/L — ABNORMAL HIGH (ref 0–44)
AST: 106 U/L — ABNORMAL HIGH (ref 15–41)
Albumin: 3.4 g/dL — ABNORMAL LOW (ref 3.5–5.0)
Alkaline Phosphatase: 95 U/L (ref 38–126)
Anion gap: 14 (ref 5–15)
BUN: 15 mg/dL (ref 6–20)
CO2: 22 mmol/L (ref 22–32)
Calcium: 9.2 mg/dL (ref 8.9–10.3)
Chloride: 103 mmol/L (ref 98–111)
Creatinine, Ser: 0.72 mg/dL (ref 0.44–1.00)
GFR, Estimated: >60 mL/min (ref >60)
Glucose, Bld: 78 mg/dL (ref 70–99)
Potassium: 4 mmol/L (ref 3.5–5.1)
Sodium: 139 mmol/L (ref 135–145)
Total Bilirubin: 1 mg/dL (ref 0.3–1.2)
Total Protein: 6.9 g/dL (ref 6.5–8.1)

## 2022-02-04 LAB — CK: Total CK: 522 U/L — ABNORMAL HIGH (ref 38–234)

## 2022-02-04 LAB — CBC
HCT: 44.3 % (ref 36.0–46.0)
Hemoglobin: 13.9 g/dL (ref 12.0–15.0)
MCH: 23.5 pg — ABNORMAL LOW (ref 26.0–34.0)
MCHC: 31.4 g/dL (ref 30.0–36.0)
MCV: 74.8 fL — ABNORMAL LOW (ref 80.0–100.0)
Platelets: 333 10*3/uL (ref 150–400)
RBC: 5.92 MIL/uL — ABNORMAL HIGH (ref 3.87–5.11)
RDW: 18.1 % — ABNORMAL HIGH (ref 11.5–15.5)
WBC: 6 10*3/uL (ref 4.0–10.5)
nRBC: 0 % (ref 0.0–0.2)

## 2022-02-04 LAB — GLUCOSE, CAPILLARY: Glucose-Capillary: 195 mg/dL — ABNORMAL HIGH (ref 70–99)

## 2022-02-04 MED ORDER — PROCHLORPERAZINE EDISYLATE 10 MG/2ML IJ SOLN
10.0000 mg | Freq: Once | INTRAMUSCULAR | Status: AC
  Administered 2022-02-04: 10 mg via INTRAVENOUS
  Filled 2022-02-04: qty 2

## 2022-02-04 MED ORDER — PROCHLORPERAZINE EDISYLATE 10 MG/2ML IJ SOLN: 10.0000 mg | Freq: Four times a day (QID) | INTRAMUSCULAR | Status: DC | PRN

## 2022-02-04 MED ORDER — SODIUM CHLORIDE 0.9 % IV SOLN: INTRAVENOUS | Status: DC

## 2022-02-04 NOTE — Progress Notes (Signed)
OT Cancellation Note  Patient Details Name: Brittany Hobbs MRN: [id number] DOB: [birth year]   Cancelled Treatment:    Reason Eval/Treat Not Completed: Patient at procedure or test/ unavailable (continuous EEG). RN awaiting determination on length of study, OT will continue to follow for evaluation.  [place] 02/04/2022, 8:39 AM  Jesse Sans OTR/L Acute Rehabilitation Services Pager: [phone] Office: [phone]

## 2022-02-04 NOTE — Procedures (Addendum)
Patient Name: Brittany Hobbs  MRN: [id number]  Epilepsy Attending: Lora Havens  Referring Physician/Provider: Lora Havens Duration: 02/03/2022 1658 to 02/04/2022  1658   Patient history: 62 year old female with altered mental status.  EEG to evaluate for seizure.   Level of alertness: Awake, asleep   AEDs during EEG study: None   Technical aspects: This EEG study was done with scalp electrodes positioned according to the 10-20 International system of electrode placement. Electrical activity was acquired at a sampling rate of '500Hz'$  and reviewed with a high frequency filter of '70Hz'$  and a low frequency filter of '1Hz'$ . EEG data were recorded continuously and digitally stored.    Description: The posterior dominant rhythm consists of 7.'5Hz'$  activity of moderate voltage (25-35 uV) seen predominantly in posterior head regions, symmetric and reactive to eye opening and eye closing. Sleep was characterized by vertex waves, sleep spindles (12 to 14 Hz), maximal frontocentral region. EEG showed continuous generalized predominantly 5 to 7 Hz theta slowing as well as intermittent 2-'3hz'$  delta slowing. Generalized periodic discharges with triphasic morphology at 1.'5Hz'$  were also noted predominantly when awake, stimulated. Hyperventilation and photic stimulation were not performed.      ABNORMALITY - Periodic discharges with triphasic morphology, generalized ( GPDs) - Continuous slow, generalized   IMPRESSION: This study showed generalized periodic discharges with triphasic morphology which can be on the ictal-interictal continuum.  However the frequency and morphology of discharges is most likely indicative of toxic-metabolic causes.  Additionally there is moderate diffuse encephalopathy, nonspecific to etiology.  No seizures were seen throughout the recording.   Kruze Atchley Barbra Sarks

## 2022-02-04 NOTE — Progress Notes (Signed)
Unable to complete admission history due to patient confused and not able to answer questions at this time.

## 2022-02-04 NOTE — Progress Notes (Signed)
Neurology Progress Note   S:// Awake, alert and impulsive wanting to get out of bed . She needs  reorientation by her daughter and sitter pertaining to her situation and EEG LEADS. Stated being hungry. No other complaints Awaiting for SLT Swallow Evaluation  O:// Current vital signs: BP (!) 185/81 (BP Location: Left Arm)   Pulse 60   Temp (!) 97.5 F (36.4 C) (Axillary)   Resp 18   Wt 58.8 kg   SpO2 100%   BMI 24.49 kg/m  Vital signs in last 24 hours: Temp:  [97.5 F (36.4 C)-98.6 F (37 C)] 97.5 F (36.4 C) (05/20 0811) Pulse Rate:  [60-67] 60 (05/20 1200) Resp:  [10-20] 18 (05/20 1200) BP: (150-185)/(72-86) 185/81 (05/20 1200) SpO2:  [95 %-100 %] 100 % (05/20 0505)    General: Patient is sitting up in bed with mitts and impulsive behaviors. Patient's daughter is at the bedside.  Neurological exam: Mental Status: AA&O x3 (Person/Place/Year 2023) She is able to recite her dob, correct age , home address and Brittany Hobbs( This was verified with her daughter) Patient is unsure of events. She is able to identify her daughter by name. Not able to recall current POTUS Language: speech is normal, Naming, repetition is intact. Cranial Nerves: PERRL 3 mm- 45m/brisk. EOMI, visual fields full, no facial asymmetry, facial sensation intact, hearing intact, tongue/uvula/soft palate midline,  Motor: Moves all Extremities . Will not participate in strength testing Tone: is normal and bulk is normal Sensation- Intact to light touch bilaterally Coordination: will not participate Gait- deferred  Medications  Current Facility-Administered Medications:    0.9 %  sodium chloride infusion, , Intravenous, Continuous, SThurnell Lose MD, Last Rate: 50 mL/hr at 02/04/22 1130, New Bag at 02/04/22 1130   acetaminophen (TYLENOL) tablet 650 mg, 650 mg, Oral, Q6H PRN, GJonetta Osgood MD, 650 mg at 02/03/22 1354   albuterol (PROVENTIL) (2.5 MG/3ML) 0.083% nebulizer solution 3 mL, 3 mL,  Inhalation, Q6H PRN, MMarcelyn Bruins MD   Ampicillin-Sulbactam (UNASYN) 3 g in sodium chloride 0.9 % 100 mL IVPB, 3 g, Intravenous, Q6H, Ghimire, SHenreitta Leber MD, Stopped at 02/04/22 0954   enoxaparin (LOVENOX) injection 40 mg, 40 mg, Subcutaneous, Q24H, MMarcelyn Bruins MD, 40 mg at 02/03/22 2233   hydrALAZINE (APRESOLINE) injection 10 mg, 10 mg, Intravenous, Q6H PRN, GJonetta Osgood MD   labetalol (NORMODYNE) injection 10 mg, 10 mg, Intravenous, Q10 min PRN, Ghimire, SHenreitta Leber MD   LORazepam (ATIVAN) injection 1 mg, 1 mg, Intravenous, Q6H PRN, GSloan Leiter Shanker M, MD, 1 mg at 02/03/22 1418   methylPREDNISolone sodium succinate (SOLU-MEDROL) 40 mg/mL injection 20 mg, 20 mg, Intravenous, Q24H, Ghimire, Shanker M, MD, 20 mg at 02/03/22 1625   metoprolol tartrate (LOPRESSOR) injection 5 mg, 5 mg, Intravenous, Q6H, Ghimire, Shanker M, MD, 5 mg at 02/04/22 1127   polyethylene glycol (MIRALAX / GLYCOLAX) packet 17 g, 17 g, Oral, Daily PRN, MMarcelyn Bruins MD   sodium chloride flush (NS) 0.9 % injection 3 mL, 3 mL, Intravenous, Q12H, MMarcelyn Bruins MD, 3 mL at 02/02/22 2000  Labs CBC    Component Value Date/Time   WBC 6.0 02/04/2022 0215   RBC 5.92 (H) 02/04/2022 0215   HGB 13.9 02/04/2022 0215   HCT 44.3 02/04/2022 0215   PLT 333 02/04/2022 0215   MCV 74.8 (L) 02/04/2022 0215   MCH 23.5 (L) 02/04/2022 0215   MCHC 31.4 02/04/2022 0215   RDW 18.1 (H) 02/04/2022 0215   LYMPHSABS  1.8 02/01/2022 2113   MONOABS 0.6 02/01/2022 2113   EOSABS 0.0 02/01/2022 2113   BASOSABS 0.0 02/01/2022 2113    CMP     Component Value Date/Time   NA 139 02/04/2022 0215   K 4.0 02/04/2022 0215   CL 103 02/04/2022 0215   CO2 22 02/04/2022 0215   GLUCOSE 78 02/04/2022 0215   BUN 15 02/04/2022 0215   CREATININE 0.72 02/04/2022 0215   CALCIUM 9.2 02/04/2022 0215   PROT 6.9 02/04/2022 0215   ALBUMIN 3.4 (L) 02/04/2022 0215   AST 106 (H) 02/04/2022 0215   ALT 167 (H) 02/04/2022 0215    ALKPHOS 95 02/04/2022 0215   BILITOT 1.0 02/04/2022 0215   GFRNONAA >60 02/04/2022 0215   GFRAA >60 10/02/2019 0445    glycosylated hemoglobin  Lipid Panel  No results found for: CHOL, TRIG, HDL, CHOLHDL, VLDL, LDLCALC, LDLDIRECT   Imaging I have reviewed images in epic and the results pertinent to this consultation are:   MRI examination of the brain w/o contrast: 02/03/22 No acute infarct, mass effect or extra-axial collection. No acute or chronic hemorrhage. There is multifocal hyperintense T2-weighted signal within the white matter. Parenchymal volume and CSF spaces are normal. The midline structures are normal. Intravenous  contrast agent was not administered due to patient motion and  inability to cooperate with the technologist's instructions.    Assessment: 61 year old female with a history of lupus HTN who was found confused during a wellness check. - Acute encephalopathy - Patient appeared on initial assessment Thursday to be aphasic and perseverating. Ddx at that time included stroke vs PRES, which have both been ruled out by MRI. Medication side effects and toxic delirium were also on the DDx as she is on Adderall and UDS was positive for THC. Hypertensive encephalopathy is also a consideration as she was hypertensive on arrival. Acute hypoxic respiratory failure also on the DDx given low oxygen saturation on EMS arrival. Low suspicion for meningitis in absence of fever or neck stiffness. - EEG showed generalized periodic discharges with triphasic morphology which can be on the ictal-interictal continuum.  However the frequency and morphology of discharges was most likely indicative of toxic-metabolic causes. Additionally there is moderate diffuse encephalopathy, nonspecific to etiology. No seizures were seen throughout the recording.   Recommendations: - Neuro checks q 4 hours - Continue LTM - NO NEED FOR MRI BRAIN CONTRAST STUDIES - We will follow along with  you  -- Brittany Hobbs , PA-C Neurohospitalist APP [facility] Pager: [phone]   Electronically signed: Dr. Kerney Hobbs

## 2022-02-04 NOTE — Progress Notes (Signed)
Patient BP 165/92 at 1350hrs, given hydralazine '10mg'$  IV as per prn order.  BP 176/75 at 1428hrs, given labetolol '10mg'$  IV q13mn x 2 per prn order for BP 144/66, HR 67. Will continue to monitor.

## 2022-02-04 NOTE — Evaluation (Signed)
Clinical/Bedside Swallow Evaluation Patient Details  Name: Brittany Hobbs MRN: [id number] Date of Birth: 07-06-1961  Today's Date: 02/04/2022 Time: SLP Start Time (ACUTE ONLY): 33 SLP Stop Time (ACUTE ONLY): 6160 SLP Time Calculation (min) (ACUTE ONLY): 25 min  Past Medical History:  Past Medical History:  Diagnosis Date   Arthritis    Asthma    Diverticulitis    Hyperlipidemia    Lupus ([place])    Panic attacks    Pneumonia 09/2020   with covid   PONV (postoperative nausea and vomiting)    Seasonal allergies    Past Surgical History:  Past Surgical History:  Procedure Laterality Date   COLON RESECTION SIGMOID N/A 06/10/2021   Procedure: OPEN SIGMOID COLON RESECTION WITH END COLOSTOMY AND ABDOMINAL WASHOUT;  Surgeon: Dwan Bolt, MD;  Location: [facility];  Service: General;  Laterality: N/A;   CYSTOSCOPY WITH STENT PLACEMENT  06/10/2021   Procedure: CYSTOSCOPY WITH LEFT STENT PLACEMENT;  Surgeon: Dwan Bolt, MD;  Location: [facility];  Service: General;;   LAPAROTOMY N/A 06/19/2021   Procedure: EXPLORATORY LAPAROTOMY;  Surgeon: Erroll Luna, MD;  Location: [facility];  Service: General;  Laterality: N/A;   ORIF CALCANEOUS FRACTURE Right 08/26/2021   Procedure: OPEN REDUCTION INTERNAL FIXATION (ORIF) CALCANEOUS FRACTURE;  Surgeon: Shona Needles, MD;  Location: [facility];  Service: Orthopedics;  Laterality: Right;   PATELLA FRACTURE SURGERY Left    scar tissue eye  1983   right   TOTAL KNEE ARTHROPLASTY Right 05/20/2021   Procedure: RIGHT TOTAL KNEE ARTHROPLASTY;  Surgeon: Mcarthur Rossetti, MD;  Location: [facility];  Service: Orthopedics;  Laterality: Right;  Needs RNFA   TUBAL LIGATION  1985   WRIST FRACTURE SURGERY Right    HPI:  61 y.o.  female with history of lupus on steroids, history of diverticular abscess-s/p ostomy, HTN, HLD, depression/anxiety, ADHD-last seen by neighbors this past Tuesday-presenting with altered mental status (sister/landlord found her on the floor     Assessment / Plan / Recommendation  Clinical Impression  Pts swallow appears within functional limits. No overt s/sx of aspiration with any PO. Note hx of waxing/waning mentation since admission; per chart review cognition continues to improve. Pt with brisk swallow per palpation. Continue mechanical soft and thin liquid diet and advance as tolerated. No further ST needs identified. SLP Visit Diagnosis: Dysphagia, unspecified (R13.10)    Aspiration Risk  Mild aspiration risk    Diet Recommendation  Dysphagia 3 (mechanical soft) thin liquids; advance as tolerated  Medication Administration: Whole meds with liquid    Other  Recommendations Oral Care Recommendations: Oral care BID    Recommendations for follow up therapy are one component of a multi-disciplinary discharge planning process, led by the attending physician.  Recommendations may be updated based on patient status, additional functional criteria and insurance authorization.  Follow up Recommendations No SLP follow up      Assistance Recommended at Discharge    Functional Status Assessment    Frequency and Duration            Prognosis Prognosis for Safe Diet Advancement: Good Barriers to Reach Goals: Cognitive deficits      Swallow Study   General Date of Onset: 02/01/22 HPI: 61 y.o.  female with history of lupus on steroids, history of diverticular abscess-s/p ostomy, HTN, HLD, depression/anxiety, ADHD-last seen by neighbors this past Tuesday-presenting with altered mental status (sister/landlord found her on the floor Type of Study: Bedside Swallow Evaluation Previous Swallow Assessment: none  on file Diet Prior to this Study: Dysphagia 3 (soft);Thin liquids Temperature Spikes Noted: No Respiratory Status: Room air History of Recent Intubation: No Behavior/Cognition: Alert;Pleasant mood;Confused Oral Cavity Assessment: Dry Oral Cavity - Dentition: Dentures, top (edentulous lower) Vision: Functional for  self-feeding Self-Feeding Abilities: Able to feed self Patient Positioning: Upright in bed Baseline Vocal Quality: Normal    Oral/Motor/Sensory Function Overall Oral Motor/Sensory Function: Within functional limits   Ice Chips Ice chips: Within functional limits   Thin Liquid Thin Liquid: Within functional limits Presentation: Cup;Straw    Nectar Thick Nectar Thick Liquid: Not tested   Honey Thick Honey Thick Liquid: Not tested   Puree Puree: Within functional limits   Solid     Solid: Within functional limits      Hayden Rasmussen MA, CCC-SLP Acute Rehabilitation Services   02/04/2022,11:02 AM

## 2022-02-04 NOTE — Progress Notes (Signed)
Maint and skin check complete. No break down.

## 2022-02-04 NOTE — Progress Notes (Signed)
LTM maint complete - no skin breakdown  Serviced C3 Fz A2 [facility] monitored, Event button test confirmed by [facility].

## 2022-02-04 NOTE — Progress Notes (Signed)
PT Cancellation Note  Patient Details Name: Brezlyn Manrique MRN: [id number] DOB: 1961/07/28   Cancelled Treatment:    Reason Eval/Treat Not Completed: Other (comment) (pt with continuous EEG and RN awaiting determination on length of study)   Keileigh Vahey B Fortino Haag 02/04/2022, 8:26 AM Bayard Males, PT Acute Rehabilitation Services Pager: [phone] Office: [phone]
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
## 2022-02-04 NOTE — Progress Notes (Signed)
Pt reports nausea and dry heaving, also complains off and on of being hot and cold; noted facial sweating at one time also.  Patient has had facial flushing per shift report and pt states it is from her lupus.EKG done with no acute changes noted.  Will get CBG; offered ginger ale; paged Dr. [place] Crane; awaiting further orders.

## 2022-02-05 LAB — CBC WITH DIFFERENTIAL/PLATELET
Abs Immature Granulocytes: 0.07 10*3/uL (ref 0.00–0.07)
Basophils Absolute: 0 10*3/uL (ref 0.0–0.1)
Basophils Relative: 1 %
Eosinophils Absolute: 0 10*3/uL (ref 0.0–0.5)
Eosinophils Relative: 0 %
HCT: 44.8 % (ref 36.0–46.0)
Hemoglobin: 14.3 g/dL (ref 12.0–15.0)
Immature Granulocytes: 1 %
Lymphocytes Relative: 28 %
Lymphs Abs: 2.2 10*3/uL (ref 0.7–4.0)
MCH: 23.3 pg — ABNORMAL LOW (ref 26.0–34.0)
MCHC: 31.9 g/dL (ref 30.0–36.0)
MCV: 73 fL — ABNORMAL LOW (ref 80.0–100.0)
Monocytes Absolute: 0.7 10*3/uL (ref 0.1–1.0)
Monocytes Relative: 9 %
Neutro Abs: 4.9 10*3/uL (ref 1.7–7.7)
Neutrophils Relative %: 61 %
Platelets: 370 10*3/uL (ref 150–400)
RBC: 6.14 MIL/uL — ABNORMAL HIGH (ref 3.87–5.11)
RDW: 18.7 % — ABNORMAL HIGH (ref 11.5–15.5)
WBC: 7.9 10*3/uL (ref 4.0–10.5)
nRBC: 0 % (ref 0.0–0.2)

## 2022-02-05 LAB — COMPREHENSIVE METABOLIC PANEL
ALT: 112 U/L — ABNORMAL HIGH (ref 0–44)
AST: 53 U/L — ABNORMAL HIGH (ref 15–41)
Albumin: 3.6 g/dL (ref 3.5–5.0)
Alkaline Phosphatase: 92 U/L (ref 38–126)
Anion gap: 11 (ref 5–15)
BUN: 10 mg/dL (ref 6–20)
CO2: 25 mmol/L (ref 22–32)
Calcium: 9.4 mg/dL (ref 8.9–10.3)
Chloride: 102 mmol/L (ref 98–111)
Creatinine, Ser: 0.62 mg/dL (ref 0.44–1.00)
GFR, Estimated: >60 mL/min (ref >60)
Glucose, Bld: 107 mg/dL — ABNORMAL HIGH (ref 70–99)
Potassium: 3.5 mmol/L (ref 3.5–5.1)
Sodium: 138 mmol/L (ref 135–145)
Total Bilirubin: 0.8 mg/dL (ref 0.3–1.2)
Total Protein: 6.9 g/dL (ref 6.5–8.1)

## 2022-02-05 LAB — CK: Total CK: 282 U/L — ABNORMAL HIGH (ref 38–234)

## 2022-02-05 LAB — MAGNESIUM: Magnesium: 1.8 mg/dL (ref 1.7–2.4)

## 2022-02-05 MED ORDER — GABAPENTIN 100 MG PO CAPS
100.0000 mg | ORAL_CAPSULE | Freq: Three times a day (TID) | ORAL | Status: DC
  Administered 2022-02-05 – 2022-02-06 (×4): 100 mg via ORAL
  Filled 2022-02-05 (×4): qty 1

## 2022-02-05 MED ORDER — HYDRALAZINE HCL 50 MG PO TABS
50.0000 mg | ORAL_TABLET | Freq: Three times a day (TID) | ORAL | Status: DC
  Administered 2022-02-05 (×3): 50 mg via ORAL
  Filled 2022-02-05 (×3): qty 1

## 2022-02-05 MED ORDER — ESCITALOPRAM OXALATE 10 MG PO TABS
20.0000 mg | ORAL_TABLET | Freq: Every day | ORAL | Status: DC
  Administered 2022-02-05 – 2022-02-06 (×2): 20 mg via ORAL
  Filled 2022-02-05 (×2): qty 2

## 2022-02-05 MED ORDER — LORAZEPAM 0.5 MG PO TABS
0.5000 mg | ORAL_TABLET | Freq: Four times a day (QID) | ORAL | Status: DC | PRN
Start: 2022-02-05 — End: 2022-02-05

## 2022-02-05 MED ORDER — LORAZEPAM 0.5 MG PO TABS: 0.5000 mg | ORAL_TABLET | Freq: Every day | ORAL | Status: DC | PRN

## 2022-02-05 MED ORDER — LORAZEPAM 1 MG PO TABS: 1.0000 mg | ORAL_TABLET | Freq: Two times a day (BID) | ORAL | Status: DC | PRN

## 2022-02-05 MED ORDER — HYDRALAZINE HCL 20 MG/ML IJ SOLN
10.0000 mg | Freq: Four times a day (QID) | INTRAMUSCULAR | Status: DC | PRN
  Administered 2022-02-05: 10 mg via INTRAVENOUS
  Filled 2022-02-05: qty 1

## 2022-02-05 MED ORDER — AMLODIPINE BESYLATE 10 MG PO TABS
10.0000 mg | ORAL_TABLET | Freq: Every day | ORAL | Status: DC
Start: 2022-02-05 — End: 2022-02-06
  Administered 2022-02-05 – 2022-02-06 (×2): 10 mg via ORAL
  Filled 2022-02-05 (×2): qty 1

## 2022-02-05 MED ORDER — ASPIRIN 81 MG PO TBEC
81.0000 mg | DELAYED_RELEASE_TABLET | Freq: Every morning | ORAL | Status: DC
  Administered 2022-02-05 – 2022-02-06 (×2): 81 mg via ORAL
  Filled 2022-02-05 (×2): qty 1

## 2022-02-05 MED ORDER — LORAZEPAM 1 MG PO TABS
1.0000 mg | ORAL_TABLET | Freq: Every day | ORAL | Status: DC | PRN
  Administered 2022-02-05 (×2): 1 mg via ORAL
  Filled 2022-02-05 (×3): qty 1
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
## 2022-02-05 NOTE — Consult Note (Signed)
[facility] Nurse ostomy consult note Stoma type/location: LUQ colostomy Stomal assessment/size: Not measured today Peristomal assessment: Note seen today, pouch intact Treatment options for stomal/peristomal skin:  Output: brown stool Ostomy pouching: 1pc.flat ostomy pouching system with skin barrier ring. Pouch is Kellie Simmering #[phone], Skin barrier ring is Kellie Simmering # [phone] Education provided: None Enrolled patient in [facility] program: Yes, previously  Supply order numbers are provided today via the Orders for Bedside RN to assist as needed with ostomy management until patient is able to resume self care.   [facility] nursing team will not follow, but will remain available to this patient, the nursing and medical teams.  Please re-consult if needed. Thanks, Maudie Flakes, MSN, RN, [employer], Arther Abbott  Pager# [phone]

## 2022-02-05 NOTE — Progress Notes (Signed)
LTM EEG discontinued - no skin breakdown at unhook.   

## 2022-02-05 NOTE — Progress Notes (Addendum)
Patient has one piece ileostomy appliance and noted brown stool leaking from ostomy right of stoma.  Unable to find one piece in [facility] system.    Obtained one piece ostomy bag from [employer]. Appliance changed and WOC consult placed.

## 2022-02-05 NOTE — Progress Notes (Signed)
Pharmacy Antibiotic Note  Brittany Hobbs is a 61 y.o. female admitted on 02/01/2022 with aspiration pneumonia.  Pharmacy has been consulted for Unasyn dosing.  Plan: Unasyn 3 g IV q6h x 5 days Follow-up clinical status, renal function Follow-up cultures, LOT, de-escalate as able  Weight: 58.8 kg (129 lb 10.1 oz)  Temp (24hrs), Avg:98 F (36.7 C), Min:97.4 F (36.3 C), Max:98.5 F (36.9 C)  Recent Labs  Lab 02/01/22 2113 02/01/22 2115 02/02/22 0040 02/02/22 0240 02/03/22 0302 02/04/22 0215 02/05/22 0750 02/05/22 0940  WBC 9.3  --   --  7.1 6.5 6.0 7.9  --   CREATININE 0.87  --   --  0.67 0.68 0.72  --  0.62  LATICACIDVEN  --  2.7* 1.4 1.0  --   --   --   --     Estimated Creatinine Clearance: 61.6 mL/min (by C-G formula based on SCr of 0.62 mg/dL).    Allergies  Allergen Reactions   Contrast Media [Iodinated Contrast Media] Anaphylaxis   Iodine Anaphylaxis    Pt states she is allergic to INTERNAL IODINE   Betadine [Povidone Iodine] Hives   Methocarbamol Nausea Only    Felt nauseated and sick   Sulfa Antibiotics Nausea And Vomiting    Antimicrobials this admission: Unasyn 5/18 >>  Ceftriaxone 5/17 x1 Azithromycin 5/17 x1  Microbiology results: 5/18 BCx: ng x 3d 5/18 Resp panel: negative   Thank you for allowing pharmacy to be a part of this patient's care.  Vance Peper, PharmD PGY1 Pharmacy Resident Phone [phone] 02/05/2022 10:59 AM   Please check AMION for all [place] phone numbers After 10:00 PM, call [facility] [phone]

## 2022-02-05 NOTE — Progress Notes (Signed)
Subjective: The patient states that she feels back to baseline. She is requesting discharge home so that she can care for her pets.   Objective: Current vital signs: BP (!) 179/87   Pulse 62   Temp 98.5 F (36.9 C) (Oral)   Resp 17   Wt 58.8 kg   SpO2 92%   BMI 24.49 kg/m  Vital signs in last 24 hours: Temp:  [97.4 F (36.3 C)-98.5 F (36.9 C)] 98.5 F (36.9 C) (05/21 0420) Pulse Rate:  [60-93] 62 (05/21 0420) Resp:  [17-20] 17 (05/21 0420) BP: (132-185)/(65-92) 179/87 (05/21 0628) SpO2:  [92 %-99 %] 92 % (05/21 0420)  Intake/Output from previous day: 05/20 0701 - 05/21 0700 In: 1490.2 [P.O.:700; I.V.:489.1; IV Piggyback:301.1] Out: 550 [Urine:550] Intake/Output this shift: No intake/output data recorded. Nutritional status:  Diet Order             DIET SOFT Room service appropriate? 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 Red facial rash bilaterally (secondary to lupus) Lungs: Respirations unlabored Ext: No edema  Neurologic Exam: Ment: Awake and alert. Oriented x 5. Speech fluent with intact comprehension and naming. Able to follow all commands. Thought pattern expressed in her speech is linear. Pleasant and cooperative. Abstraction intact. Concentration normal: Able to recite months of the year forwards and backwards without difficulty.  CN: EOMI. Pupils are equal. Sensation to temp equal bilaterally. Smile is symmetric. Tongue midline. Motor: 4+/5 x 4 Sensory: Intact to temp x 4 without asymmetry.  Reflexes: 2+ patellar reflexes Cerebellar: No ataxia with FNF bilaterally Gait: Deferred  Lab Results: Results for orders placed or performed during the hospital encounter of 02/01/22 (from the past 48 hour(s))  CBC     Status: Abnormal   Collection Time: 02/04/22  2:15 AM  Result Value Ref Range   WBC 6.0 4.0 - 10.5 K/uL   RBC 5.92 (H) 3.87 - 5.11 MIL/uL   Hemoglobin 13.9 12.0 - 15.0  g/dL   HCT 44.3 36.0 - 46.0 %   MCV 74.8 (L) 80.0 - 100.0 fL   MCH 23.5 (L) 26.0 - 34.0 pg   MCHC 31.4 30.0 - 36.0 g/dL   RDW 18.1 (H) 11.5 - 15.5 %   Platelets 333 150 - 400 K/uL   nRBC 0.0 0.0 - 0.2 %    Comment: Performed at [facility], [address] [place], [place] [address]  Comprehensive metabolic panel     Status: Abnormal   Collection Time: 02/04/22  2:15 AM  Result Value Ref Range   Sodium 139 135 - 145 mmol/L   Potassium 4.0 3.5 - 5.1 mmol/L   Chloride 103 98 - 111 mmol/L   CO2 22 22 - 32 mmol/L   Glucose, Bld 78 70 - 99 mg/dL    Comment: Glucose reference range applies only to samples taken after fasting for at least 8 hours.   BUN 15 6 - 20 mg/dL   Creatinine, Ser 0.72 0.44 - 1.00 mg/dL   Calcium 9.2 8.9 - 10.3 mg/dL   Total Protein 6.9 6.5 - 8.1 g/dL   Albumin 3.4 (L) 3.5 - 5.0 g/dL   AST 106 (H) 15 - 41 U/L   ALT 167 (H) 0 - 44 U/L   Alkaline Phosphatase 95 38 - 126 U/L   Total Bilirubin 1.0 0.3 - 1.2 mg/dL  GFR, Estimated >60 >60 mL/min    Comment: (NOTE) Calculated using the CKD-EPI Creatinine Equation (2021)    Anion gap 14 5 - 15    Comment: Performed at [facility], [place] [address], [place], [place] [address]  CK     Status: Abnormal   Collection Time: 02/04/22  2:15 AM  Result Value Ref Range   Total CK 522 (H) 38 - 234 U/L    Comment: Performed at [facility], [place] [address] [place], [place] [address]  Glucose, capillary     Status: Abnormal   Collection Time: 02/04/22  9:50 PM  Result Value Ref Range   Glucose-Capillary 195 (H) 70 - 99 mg/dL    Comment: Glucose reference range applies only to samples taken after fasting for at least 8 hours.   Comment 1 Notify RN     Recent Results (from the past 240 hour(s))  Resp Panel by RT-PCR (Flu A&B, Covid) Nasopharyngeal Swab     Status: None   Collection Time: 02/02/22  1:10 AM   Specimen: Nasopharyngeal Swab; Nasopharyngeal(NP) swabs in vial transport medium  Result Value Ref  Range Status   SARS Coronavirus 2 by RT PCR NEGATIVE NEGATIVE Final    Comment: (NOTE) SARS-CoV-2 target nucleic acids are NOT DETECTED.  The SARS-CoV-2 RNA is generally detectable in upper respiratory specimens during the acute phase of infection. The lowest concentration of SARS-CoV-2 viral copies this assay can detect is 138 copies/mL. A negative result does not preclude SARS-Cov-2 infection and should not be used as the sole basis for treatment or other patient management decisions. A negative result may occur with  improper specimen collection/handling, submission of specimen other than nasopharyngeal swab, presence of viral mutation(s) within the areas targeted by this assay, and inadequate number of viral copies(<138 copies/mL). A negative result must be combined with clinical observations, patient history, and epidemiological information. The expected result is Negative.  Fact Sheet for Patients:  EntrepreneurPulse.com.au  Fact Sheet for Healthcare Providers:  IncredibleEmployment.be  This test is no t yet approved or cleared by the [place] FDA and  has been authorized for detection and/or diagnosis of SARS-CoV-2 by FDA under an Emergency Use Authorization (EUA). This EUA will remain  in effect (meaning this test can be used) for the duration of the COVID-19 declaration under Section 564(b)(1) of the Act, 21 U.S.C.section 360bbb-3(b)(1), unless the authorization is terminated  or revoked sooner.       Influenza A by PCR NEGATIVE NEGATIVE Final   Influenza B by PCR NEGATIVE NEGATIVE Final    Comment: (NOTE) The Xpert Xpress SARS-CoV-2/FLU/RSV plus assay is intended as an aid in the diagnosis of influenza from Nasopharyngeal swab specimens and should not be used as a sole basis for treatment. Nasal washings and aspirates are unacceptable for Xpert Xpress SARS-CoV-2/FLU/RSV testing.  Fact Sheet for  Patients: EntrepreneurPulse.com.au  Fact Sheet for Healthcare Providers: IncredibleEmployment.be  This test is not yet approved or cleared by the [place] FDA and has been authorized for detection and/or diagnosis of SARS-CoV-2 by FDA under an Emergency Use Authorization (EUA). This EUA will remain in effect (meaning this test can be used) for the duration of the COVID-19 declaration under Section 564(b)(1) of the Act, 21 U.S.C. section 360bbb-3(b)(1), unless the authorization is terminated or revoked.  Performed at [facility], [place] [address], [place], [place] [address]   Respiratory (~20 pathogens) panel by PCR     Status: None  Collection Time: 02/02/22  1:10 AM   Specimen: Nasopharyngeal Swab; Respiratory  Result Value Ref Range Status   Adenovirus NOT DETECTED NOT DETECTED Final   Coronavirus 229E NOT DETECTED NOT DETECTED Final    Comment: (NOTE) The Coronavirus on the Respiratory Panel, DOES NOT test for the novel  Coronavirus (2019 nCoV)    Coronavirus HKU1 NOT DETECTED NOT DETECTED Final   Coronavirus NL63 NOT DETECTED NOT DETECTED Final   Coronavirus OC43 NOT DETECTED NOT DETECTED Final   Metapneumovirus NOT DETECTED NOT DETECTED Final   Rhinovirus / Enterovirus NOT DETECTED NOT DETECTED Final   Influenza A NOT DETECTED NOT DETECTED Final   Influenza B NOT DETECTED NOT DETECTED Final   Parainfluenza Virus 1 NOT DETECTED NOT DETECTED Final   Parainfluenza Virus 2 NOT DETECTED NOT DETECTED Final   Parainfluenza Virus 3 NOT DETECTED NOT DETECTED Final   Parainfluenza Virus 4 NOT DETECTED NOT DETECTED Final   Respiratory Syncytial Virus NOT DETECTED NOT DETECTED Final   Bordetella pertussis NOT DETECTED NOT DETECTED Final   Bordetella Parapertussis NOT DETECTED NOT DETECTED Final   Chlamydophila pneumoniae NOT DETECTED NOT DETECTED Final   Mycoplasma pneumoniae NOT DETECTED NOT DETECTED Final    Comment: Performed at  [facility], [place] [address] [place], [place] [address]  Culture, blood (Routine X 2) w Reflex to ID Panel     Status: None (Preliminary result)   Collection Time: 02/02/22  7:00 AM   Specimen: BLOOD LEFT FOREARM  Result Value Ref Range Status   Specimen Description BLOOD LEFT FOREARM  Final   Special Requests   Final    BOTTLES DRAWN AEROBIC AND ANAEROBIC Blood Culture adequate volume   Culture   Final    NO GROWTH 2 DAYS Performed at [facility], [place] [address], [place], [place] [address]    Report Status PENDING  Incomplete  Culture, blood (Routine X 2) w Reflex to ID Panel     Status: None (Preliminary result)   Collection Time: 02/02/22  7:00 AM   Specimen: BLOOD RIGHT HAND  Result Value Ref Range Status   Specimen Description BLOOD RIGHT HAND  Final   Special Requests   Final    BOTTLES DRAWN AEROBIC AND ANAEROBIC Blood Culture results may not be optimal due to an inadequate volume of blood received in culture bottles   Culture   Final    NO GROWTH 2 DAYS Performed at [facility], [place] [address] [place], [place] [address]    Report Status PENDING  Incomplete    Lipid Panel No results for input(s): CHOL, TRIG, HDL, CHOLHDL, VLDL, LDLCALC in the last 72 hours.  Studies/Results: MR BRAIN WO CONTRAST  Result Date: 02/03/2022 CLINICAL DATA:  Altered mental status EXAM: MRI HEAD WITHOUT CONTRAST TECHNIQUE: Multiplanar, multiecho pulse sequences of the brain and surrounding structures were obtained without intravenous contrast. COMPARISON:  None Available. FINDINGS: Brain: No acute infarct, mass effect or extra-axial collection. No acute or chronic hemorrhage. There is multifocal hyperintense T2-weighted signal within the white matter. Parenchymal volume and CSF spaces are normal. The midline structures are normal. Vascular: Major flow voids are preserved. Skull and upper cervical spine: Normal calvarium and skull base. Visualized upper cervical spine and soft  tissues are normal. Sinuses/Orbits:No paranasal sinus fluid levels or advanced mucosal thickening. No mastoid or middle ear effusion. Normal orbits. IMPRESSION: 1. No acute intracranial abnormality. 2. Mild chronic small vessel ischemia. Intravenous contrast agent was not administered due  to patient motion and inability to cooperate with the technologist's instructions. Electronically Signed   By: Ulyses Jarred M.D.   On: 02/03/2022 19:26   Overnight EEG with video  Result Date: 02/04/2022 Lora Havens, MD     02/05/2022  7:03 AM Patient Name: Azure Budnick MRN: [id number] Epilepsy Attending: Lora Havens Referring Physician/Provider: Lora Havens Duration: 02/03/2022 1658 to 02/04/2022  1658  Patient history: 61 year old female with altered mental status.  EEG to evaluate for seizure.  Level of alertness: Awake, asleep  AEDs during EEG study: None  Technical aspects: This EEG study was done with scalp electrodes positioned according to the 10-20 International system of electrode placement. Electrical activity was acquired at a sampling rate of '500Hz'$  and reviewed with a high frequency filter of '70Hz'$  and a low frequency filter of '1Hz'$ . EEG data were recorded continuously and digitally stored.  Description: The posterior dominant rhythm consists of 7.'5Hz'$  activity of moderate voltage (25-35 uV) seen predominantly in posterior head regions, symmetric and reactive to eye opening and eye closing. Sleep was characterized by vertex waves, sleep spindles (12 to 14 Hz), maximal frontocentral region. EEG showed continuous generalized predominantly 5 to 7 Hz theta slowing as well as intermittent 2-'3hz'$  delta slowing. Generalized periodic discharges with triphasic morphology at 1.'5Hz'$  were also noted predominantly when awake, stimulated. Hyperventilation and photic stimulation were not performed.    ABNORMALITY - Periodic discharges with triphasic morphology, generalized ( GPDs) - Continuous slow, generalized   IMPRESSION: This study showed generalized periodic discharges with triphasic morphology which can be on the ictal-interictal continuum.  However the frequency and morphology of discharges is most likely indicative of toxic-metabolic causes.  Additionally there is moderate diffuse encephalopathy, nonspecific to etiology.  No seizures were seen throughout the recording.  [name]    Medications: Scheduled:  amLODipine  10 mg Oral Daily   enoxaparin (LOVENOX) injection  40 mg Subcutaneous Q24H   methylPREDNISolone (SOLU-MEDROL) injection  20 mg Intravenous Q24H   metoprolol tartrate  5 mg Intravenous Q6H   sodium chloride flush  3 mL Intravenous Q12H   Continuous:  sodium chloride 50 mL/hr at 02/04/22 1519   ampicillin-sulbactam (UNASYN) IV 3 g (02/05/22 0316)   MRI examination of the brain w/o contrast: 02/03/22 No acute infarct, mass effect or extra-axial collection. No acute or chronic hemorrhage. There is multifocal hyperintense T2-weighted signal within the white matter. Parenchymal volume and CSF spaces are normal. The midline structures are normal. Intravenous  contrast agent was not administered due to patient motion and  inability to cooperate with the technologist's instructions.     Assessment: 61 year old female with a history of lupus HTN who was found confused during a wellness check. - Overall presentation was most consistent with an acute encephalopathy. Patient appeared on initial assessment Thursday to be aphasic and perseverating. Today her neurological exam is normal, with intact speech, orientation, attention and concentration.  - DDx at the time of admission included stroke vs PRES, which have both been ruled out by MRI. Medication side effects and toxic delirium were also on the DDx as she is on Adderall and UDS was positive for THC. Hypertensive encephalopathy is also a consideration as she was hypertensive on arrival. Acute hypoxic respiratory failure also on the  DDx given low oxygen saturation on EMS arrival. Low suspicion for meningitis in absence of fever or neck stiffness. - EEGs: - LTM EEG 02/03/2022 1658 to 02/04/2022  1658: Generalized periodic discharges  with triphasic morphology which can be on the ictal-interictal continuum.  However the frequency and morphology of discharges was most likely indicative of toxic-metabolic causes. Additionally there is moderate diffuse encephalopathy, nonspecific to etiology. No seizures were seen throughout the recording. - LTM EEG  02/04/2022 1658 to 02/05/2022  0700: Periodic discharges with triphasic morphology, generalized ( GPDs) on a background of continuous generalized slowing. This study showed generalized periodic discharges with triphasic morphology most likely indicative of toxic-metabolic causes. Additionally there is mild to moderate diffuse encephalopathy, nonspecific to etiology.  No seizures were seen throughout the recording.     Recommendations: - Discontinuing LTM  - No need for post-contrast brain MRI add-on study.  - Neurology will sign off. Please call if there are additional questions.    LOS: 3 days   '@Electronically'$  signed: Dr. Kerney Elbe 02/05/2022  7:36 AM
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
## 2022-02-05 NOTE — Procedures (Addendum)
Patient Name: Brittany Hobbs  MRN: [id number]  Epilepsy Attending: Lora Havens  Referring Physician/Provider: Lora Havens Duration: 02/04/2022 1658 to 02/05/2022 0700   Patient history: 61 year old female with altered mental status.  EEG to evaluate for seizure.   Level of alertness: Awake, asleep   AEDs during EEG study: None   Technical aspects: This EEG study was done with scalp electrodes positioned according to the 10-20 International system of electrode placement. Electrical activity was acquired at a sampling rate of '500Hz'$  and reviewed with a high frequency filter of '70Hz'$  and a low frequency filter of '1Hz'$ . EEG data were recorded continuously and digitally stored.    Description: The posterior dominant rhythm consists of 7.'5Hz'$  activity of moderate voltage (25-35 uV) seen predominantly in posterior head regions, symmetric and reactive to eye opening and eye closing. Sleep was characterized by vertex waves, sleep spindles (12 to 14 Hz), maximal frontocentral region. EEG showed continuous generalized predominantly 5 to 7 Hz theta slowing as well as intermittent 2-'3hz'$  delta slowing. Generalized periodic discharges with triphasic morphology at 1.'5Hz'$  were also noted predominantly when awake, stimulated. Hyperventilation and photic stimulation were not performed.     Of note, parts of study were difficult to evaluate due to significant electrode artifact after 0700 on 02/05/2022.   ABNORMALITY - Periodic discharges with triphasic morphology, generalized ( GPDs) - Continuous slow, generalized   IMPRESSION: This study showed generalized periodic discharges with triphasic morphology most likely indicative of toxic-metabolic causes.  Additionally there is mild to moderate diffuse encephalopathy, nonspecific to etiology.  No seizures were seen throughout the recording.   Damari Hiltz Barbra Sarks

## 2022-02-06 ENCOUNTER — Other Ambulatory Visit (HOSPITAL_COMMUNITY): Payer: Self-pay

## 2022-02-06 MED ORDER — AMLODIPINE BESYLATE 10 MG PO TABS
10.0000 mg | ORAL_TABLET | Freq: Every day | ORAL | 0 refills | Status: AC
Start: 2022-02-06 — End: ?
  Filled 2022-02-06: qty 30, 30d supply, fill #0

## 2022-02-06 MED ORDER — GABAPENTIN 100 MG PO CAPS
100.0000 mg | ORAL_CAPSULE | Freq: Three times a day (TID) | ORAL | 0 refills | Status: AC
  Filled 2022-02-06: qty 90, 30d supply, fill #0

## 2022-02-06 NOTE — Discharge Instructions (Signed)
Follow with Primary MD Berkley Harvey, NP in 7 days   Get CBC, CMP, magnesium, 2 view Chest X ray -  checked next visit within 1 week by Primary MD    Activity: As tolerated with Full fall precautions use walker/cane & assistance as needed  Disposition Home    Diet: Heart Healthy    Special Instructions: If you have smoked or chewed Tobacco  in the last 2 yrs please stop smoking, stop any regular Alcohol  and or any Recreational drug use.  On your next visit with your primary care physician please Get Medicines reviewed and adjusted.  Please request your Prim.MD to go over all Hospital Tests and Procedure/Radiological results at the follow up, please get all Hospital records sent to your Prim MD by signing hospital release before you go home.  If you experience worsening of your admission symptoms, develop shortness of breath, life threatening emergency, suicidal or homicidal thoughts you must seek medical attention immediately by calling 911 or calling your MD immediately  if symptoms less severe.  You Must read complete instructions/literature along with all the possible adverse reactions/side effects for all the Medicines you take and that have been prescribed to you. Take any new Medicines after you have completely understood and accpet all the possible adverse reactions/side effects.
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
## 2022-02-06 NOTE — Progress Notes (Signed)
Pt ambulated full length of hallway with RN without difficulty.

## 2022-02-06 NOTE — Discharge Summary (Signed)
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                                                        +---------+---------------+---------+-----------+----------+--------------+   +---------+---------------+---------+-----------+----------+--------------+ LEFT     CompressibilityPhasicitySpontaneityPropertiesThrombus Aging +---------+---------------+---------+-----------+----------+--------------+ CFV      Full           Yes      Yes                                 +---------+---------------+---------+-----------+----------+--------------+ SFJ      Full                                                        +---------+---------------+---------+-----------+----------+--------------+  FV Prox  Full                                                        +---------+---------------+---------+-----------+----------+--------------+ FV Mid   Full                                                         +---------+---------------+---------+-----------+----------+--------------+ FV DistalFull                                                        +---------+---------------+---------+-----------+----------+--------------+ PFV      Full                                                        +---------+---------------+---------+-----------+----------+--------------+ POP      Full           Yes      Yes                                 +---------+---------------+---------+-----------+----------+--------------+ PTV      Full                                                        +---------+---------------+---------+-----------+----------+--------------+ PERO     Full                                                        +---------+---------------+---------+-----------+----------+--------------+ Gastroc  Full                                                        +---------+---------------+---------+-----------+----------+--------------+     Summary: RIGHT: - There is no evidence of deep vein thrombosis in the lower extremity.  - No cystic structure found in the popliteal fossa.  LEFT: - There is no evidence of deep vein thrombosis in the lower extremity.  - No cystic structure found in the popliteal fossa.  *See table(s) above for measurements and observations. Electronically signed by Orlie Pollen on 02/03/2022 at 5:12:24 PM.    Final      Today   Subjective    Brittany Hobbs today has no headache,no chest abdominal pain,no new weakness tingling or numbness, feels much better wants to go home today.     Objective  Blood pressure (!) 110/51, pulse 69, temperature 98.3 F (36.8 C), temperature source Oral, resp. rate 13, height '5\' 2"'$  (1.575 m), weight 59.6 kg, SpO2 93 %.   Intake/Output Summary (Last 24 hours) at 02/06/2022 0813 Last data filed at 02/05/2022 2111 Gross per 24 hour  Intake 1184.07 ml  Output --  Net 1184.07 ml    Exam  Awake Alert, No new  F.N deficits,    Pleak.AT,PERRAL Supple Neck,   Symmetrical Chest wall movement, Good air movement bilaterally, CTAB RRR,No Gallops,   +ve B.Sounds, Abd Soft, Non tender,  No Cyanosis, chronic facial rash from lupus   Data Review   CBC w Diff:  Lab Results  Component Value Date   WBC 7.9 02/05/2022   HGB 14.3 02/05/2022   HCT 44.8 02/05/2022   PLT 370 02/05/2022   LYMPHOPCT 28 02/05/2022   MONOPCT 9 02/05/2022   EOSPCT 0 02/05/2022   BASOPCT 1 02/05/2022    CMP:  Lab Results  Component Value Date   NA 138 02/05/2022   K 3.5 02/05/2022   CL 102 02/05/2022   CO2 25 02/05/2022   BUN 10 02/05/2022   CREATININE 0.62 02/05/2022   PROT 6.9 02/05/2022   ALBUMIN 3.6 02/05/2022   BILITOT 0.8 02/05/2022   ALKPHOS 92 02/05/2022   AST 53 (H) 02/05/2022   ALT 112 (H) 02/05/2022  .   Total Time in preparing paper work, data evaluation and todays exam - 15 minutes  Lala Lund M.D on 02/06/2022 at 8:13 AM  [facility]

## 2022-02-06 NOTE — Progress Notes (Signed)
Pt ambulated fine without assistance on hallway.  Idolina Primer, RN

## 2022-02-06 NOTE — Progress Notes (Signed)
Pt refused am labs. Jessie Foot, RN

## 2022-02-06 NOTE — Progress Notes (Signed)
Occupational Therapy Treatment Patient Details Name: Brittany Hobbs MRN: [id number] DOB: 12-16-1960 Today's Date: 02/06/2022   History of present illness 61 y.o. female presents to [facility] hospital on 02/01/2022 with AMS, found down during a welfare check. Sats in 80s on room air and R forehead hematoma noted by EMS. CT head negative. Admitted for management of acute encephalopathy. PMH includes history of abscess and colostomy, lupus, HLD, HTN, anxiety, depression, ADHD, asthma.   OT comments  Pt with improving cognition, back to physical baseline and eager to return home today. Pt ambulating around room, gather ADL items and finishing up dressing tasks in prep for DC. No LOB or safety concerns noted with this. Discussed pt's med mgmt routine as home medications likely have been adjusted during this admission. Pt reports using a pill box and that all but one med are to be taken 1x/day. Anticipate no physical concerns with pt managing every day tasks at home but may benefit from initial check ins/assist with med mgmt, financial mgmt at DC/   Recommendations for follow up therapy are one component of a multi-disciplinary discharge planning process, led by the attending physician.  Recommendations may be updated based on patient status, additional functional criteria and insurance authorization.    Follow Up Recommendations  No OT follow up    Assistance Recommended at Discharge PRN  Patient can return home with the following      Equipment Recommendations  None recommended by OT    Recommendations for Other Services      Precautions / Restrictions Precautions Precautions: Other (comment) Precaution Comments: colostomy Restrictions Weight Bearing Restrictions: No       Mobility Bed Mobility                    Transfers Overall transfer level: Independent Equipment used: None Transfers: Sit to/from Stand                   Balance Overall balance assessment: No  apparent balance deficits (not formally assessed)                                         ADL either performed or assessed with clinical judgement   ADL Overall ADL's : Independent     Grooming: Standing;Independent               Lower Body Dressing: Independent               Functional mobility during ADLs: Independent General ADL Comments: finishing dressing/grooming tasks in room without LOB and without safety concerns. Pt reports she showered last night, moving about room and gathering items without issues.    Extremity/Trunk Assessment Upper Extremity Assessment Upper Extremity Assessment: Overall WFL for tasks assessed   Lower Extremity Assessment Lower Extremity Assessment: Defer to PT evaluation        Vision   Vision Assessment?: No apparent visual deficits   Perception     Praxis      Cognition Arousal/Alertness: Awake/alert Behavior During Therapy: WFL for tasks assessed/performed, Restless Overall Cognitive Status: No family/caregiver present to determine baseline cognitive functioning Area of Impairment: Attention, Memory, Safety/judgement                   Current Attention Level: Alternating Memory: Decreased short-term memory   Safety/Judgement: Decreased awareness of deficits     General Comments: Pt  with improving cognition, awareness of discharge process though restless, quick movements throughout. able to discuss med mgmt routine with detail (uses pill box once a week, all meds 1x/day except for 1 medication; keeps pill box in high cabinets out of reach of grandchildren).        Exercises      Shoulder Instructions       General Comments      Pertinent Vitals/ Pain       Pain Assessment Pain Assessment: No/denies pain  Home Living                                          Prior Functioning/Environment              Frequency  Min 2X/week        Progress Toward  Goals  OT Goals(current goals can now be found in the care plan section)  Progress towards OT goals: Progressing toward goals  Acute Rehab OT Goals Patient Stated Goal: go home today OT Goal Formulation: With patient Time For Goal Achievement: 02/19/22 Potential to Achieve Goals: Good ADL Goals Pt Will Transfer to Toilet: with modified independence;ambulating Additional ADL Goal #1: Pt to complete 2-3 step trail making task with no more than min cues to attend to task Additional ADL Goal #2: Pt to demonstrate activity tolerance > 15 min during functional tasks to improve overall endurance  Plan Discharge plan remains appropriate    Co-evaluation                 AM-PAC OT "6 Clicks" Daily Activity     Outcome Measure   Help from another person eating meals?: None Help from another person taking care of personal grooming?: None Help from another person toileting, which includes using toliet, bedpan, or urinal?: None Help from another person bathing (including washing, rinsing, drying)?: None Help from another person to put on and taking off regular upper body clothing?: None Help from another person to put on and taking off regular lower body clothing?: None 6 Click Score: 24    End of Session    OT Visit Diagnosis: Unsteadiness on feet (R26.81);Other abnormalities of gait and mobility (R26.89);Other symptoms and signs involving cognitive function   Activity Tolerance Patient tolerated treatment well   Patient Left Other (comment) (ambulating in room)   Nurse Communication Mobility status;Other (comment) (cognition)        Time: 0102-7253 OT Time Calculation (min): 9 min  Charges: OT General Charges $OT Visit: 1 Visit OT Treatments $Self Care/Home Management : 8-22 mins  Malachy Chamber, OTR/L Acute Rehab Services Office: [phone]   Layla Maw 02/06/2022, 10:14 AM

## 2022-02-06 NOTE — Progress Notes (Signed)
Pt bp this am 110/51 with IV lopressor 5 mg and PO hydralazine 50 mg scheduled.  Updated Dr. Nevada Crane with patient's BP and orders to hold medications as 0600.

## 2022-02-07 ENCOUNTER — Other Ambulatory Visit (HOSPITAL_COMMUNITY): Payer: Self-pay

## 2022-02-07 DIAGNOSIS — S60222A Contusion of left hand, initial encounter: Secondary | ICD-10-CM | POA: Diagnosis not present

## 2022-02-07 DIAGNOSIS — G9340 Encephalopathy, unspecified: Secondary | ICD-10-CM | POA: Diagnosis not present

## 2022-02-07 DIAGNOSIS — Z23 Encounter for immunization: Secondary | ICD-10-CM | POA: Diagnosis not present

## 2022-02-07 LAB — CULTURE, BLOOD (ROUTINE X 2)
Culture: NO GROWTH
Culture: NO GROWTH
Special Requests: ADEQUATE

## 2022-02-07 MED ORDER — ALBUTEROL SULFATE HFA 108 (90 BASE) MCG/ACT IN AERS
INHALATION_SPRAY | RESPIRATORY_TRACT | 5 refills | Status: AC
  Filled 2022-04-26: qty 1, fill #0

## 2022-02-07 MED ORDER — ESCITALOPRAM OXALATE 20 MG PO TABS
20.0000 mg | ORAL_TABLET | Freq: Every day | ORAL | 1 refills | Status: AC
  Filled 2022-04-26: qty 30, 30d supply, fill #0

## 2022-02-08 NOTE — Progress Notes (Signed)
COVID Vaccine Completed:  Date of COVID positive in last 90 days:  PCP - Eldridge Abrahams, NP Cardiologist -   Chest x-ray - 02-03-22 Epic EKG - 02-04-22 Epic Stress Test -  ECHO - 02-02-22 Epic Cardiac Cath -  Pacemaker/ICD device last checked: Spinal Cord Stimulator:  Bowel Prep -   Sleep Study -  CPAP -   Fasting Blood Sugar -  Checks Blood Sugar _____ times a day  Blood Thinner Instructions: Aspirin Instructions: Last Dose:  Activity level:  Can go up a flight of stairs and perform activities of daily living without stopping and without symptoms of chest pain or shortness of breath.   Able to exercise without symptoms  Unable to go up a flight of stairs without symptoms of      Anesthesia review:   Recent admission for encephalopathy, respiratory failure.  Cardiomegaly  Patient denies shortness of breath, fever, cough and chest pain at PAT appointment   Patient verbalized understanding of instructions that were given to them at the PAT appointment. Patient was also instructed that they will need to review over the PAT instructions again at home before surgery.

## 2022-02-08 NOTE — Patient Instructions (Addendum)
DUE TO COVID-19 ONLY TWO VISITORS  (aged 60 and older)  IS ALLOWED TO COME WITH YOU AND STAY IN THE WAITING ROOM ONLY DURING PRE OP AND PROCEDURE.   **NO VISITORS ARE ALLOWED IN THE SHORT STAY AREA OR RECOVERY ROOM!!**  IF YOU WILL BE ADMITTED INTO THE HOSPITAL YOU ARE ALLOWED ONLY FOUR SUPPORT PEOPLE DURING VISITATION HOURS ONLY (7 AM -8PM)   The support person(s) must pass our screening, gel in and out Visitors GUEST BADGE MUST BE WORN VISIBLY  One adult visitor may remain with you overnight and MUST be in the room by 8 P.M.   You are not required to [employer] often Do NOT share personal items Notify your provider if you are in close contact with someone who has COVID or you develop fever 100.4 or greater, new onset of sneezing, cough, sore throat, shortness of breath or body aches.        Your procedure is scheduled on:  02-17-22   Report to [facility] Main Entrance    Report to admitting at 5:15 AM   Call this number if you have problems the morning of surgery [phone]   Follow a clear liquid diet day of prep to avoid dehydration   After Midnight you may have the following liquids until 4:15 AM DAY OF SURGERY  Water Black Coffee (sugar ok, NO MILK/CREAM OR CREAMERS)  Tea (sugar ok, NO MILK/CREAM OR CREAMERS) regular and decaf                             Plain Jell-O (NO RED)                                           Fruit ices (not with fruit pulp, NO RED)                                     Popsicles (NO RED)                                                                  Juice: apple, WHITE grape, WHITE cranberry Sports drinks like Gatorade (NO RED) Clear broth(vegetable,chicken,beef)              Drink two pre-surgery clear Ensure the night before surgery (complete by 10 PM)     The day of surgery:  Drink ONE (1) Pre-Surgery Clear Ensure at 4:15 AM the morning of surgery. Drink in one sitting. Do not sip.  This drink was given to you during  your hospital  pre-op appointment visit. Nothing else to drink after completing the Pre-Surgery Clear Ensure          If you have questions, please contact your surgeon's office.   FOLLOW BOWEL PREP AND ANY ADDITIONAL PRE OP INSTRUCTIONS YOU RECEIVED FROM YOUR SURGEON'S OFFICE!!!  Dulcolax 20 mg - Give with water the day prior to surgery.   Miralax 255g - Mix with 64 oz Gatorade/Powerade.  Drink gradually over the next few hours (8 oz glass  every 15-30 minutes) until gone the day prior to surgery  Metronidazole 1000 mg - At 2 pm, 3 pm and 10 pm after Miralax bowel prep the day prior to surgery.   Neomycin 1000 mg - At 2 pm, 3 pm and 10 pm after Miralax bowel prep the day prior to surgery.      Oral Hygiene is also important to reduce your risk of infection.                                    Remember - BRUSH YOUR TEETH THE MORNING OF SURGERY WITH YOUR REGULAR TOOTHPASTE   Do NOT smoke after Midnight   Take these medicines the morning of surgery with A SIP OF WATER:  Amlodipine, Escitalopram, Gabapentin, Lorazepam, Prednisone                              You may not have any metal on your body including hair pins, jewelry, and body piercing             Do not wear make-up, lotions, powders, perfumes or deodorant  Do not wear nail polish including gel and S&S, artificial/acrylic nails, or any other type of covering on natural nails including finger and toenails. If you have artificial nails, gel coating, etc. that needs to be removed by a nail salon please have this removed prior to surgery or surgery may need to be canceled/ delayed if the surgeon/ anesthesia feels like they are unable to be safely monitored.   Do not shave  48 hours prior to surgery.            Do not bring valuables to the hospital. [place].   Contacts, dentures or bridgework may not be worn into surgery.   Bring small overnight bag day of surgery.   Special Instructions:  Bring a copy of your healthcare power of attorney and living will documents the day of surgery if you haven't scanned them before.  Please read over the following fact sheets you were given: IF YOU HAVE QUESTIONS ABOUT YOUR PRE-OP INSTRUCTIONS PLEASE CALL [facility] - Preparing for Surgery Before surgery, you can play an important role.  Because skin is not sterile, your skin needs to be as free of germs as possible.  You can reduce the number of germs on your skin by washing with CHG (chlorahexidine gluconate) soap before surgery.  CHG is an antiseptic cleaner which kills germs and bonds with the skin to continue killing germs even after washing. Please DO NOT use if you have an allergy to CHG or antibacterial soaps.  If your skin becomes reddened/irritated stop using the CHG and inform your nurse when you arrive at Short Stay. Do not shave (including legs and underarms) for at least 48 hours prior to the first CHG shower.  You may shave your face/neck.  Please follow these instructions carefully:  1.  Shower with CHG Soap the night before surgery and the  morning of surgery.  2.  If you choose to wash your hair, wash your hair first as usual with your normal  shampoo.  3.  After you shampoo, rinse your hair and body thoroughly to remove the shampoo.  4.  Use CHG as you would any other liquid soap.  You can apply chg directly to the skin and wash.  Gently with a scrungie or clean washcloth.  5.  Apply the CHG Soap to your body ONLY FROM THE NECK DOWN.   Do   not use on face/ open                           Wound or open sores. Avoid contact with eyes, ears mouth and   genitals (private parts).                       Wash face,  Genitals (private parts) with your normal soap.             6.  Wash thoroughly, paying special attention to the area where your    surgery  will be performed.  7.  Thoroughly rinse your body with warm water from the neck down.  8.   DO NOT shower/wash with your normal soap after using and rinsing off the CHG Soap.                9.  Pat yourself dry with a clean towel.            10.  Wear clean pajamas.            11.  Place clean sheets on your bed the night of your first shower and do not  sleep with pets. Day of Surgery : Do not apply any lotions/deodorants the morning of surgery.  Please wear clean clothes to the hospital/surgery center.  FAILURE TO FOLLOW THESE INSTRUCTIONS MAY RESULT IN THE CANCELLATION OF YOUR SURGERY  PATIENT SIGNATURE_________________________________  NURSE SIGNATURE__________________________________  ________________________________________________________________________     Brittany Hobbs  An incentive spirometer is a tool that can help keep your lungs clear and active. This tool measures how well you are filling your lungs with each breath. Taking long deep breaths may help reverse or decrease the chance of developing breathing (pulmonary) problems (especially infection) following: A long period of time when you are unable to move or be active. BEFORE THE PROCEDURE  If the spirometer includes an indicator to show your best effort, your nurse or respiratory therapist will set it to a desired goal. If possible, sit up straight or lean slightly forward. Try not to slouch. Hold the incentive spirometer in an upright position. INSTRUCTIONS FOR USE  Sit on the edge of your bed if possible, or sit up as far as you can in bed or on a chair. Hold the incentive spirometer in an upright position. Breathe out normally. Place the mouthpiece in your mouth and seal your lips tightly around it. Breathe in slowly and as deeply as possible, raising the piston or the ball toward the top of the column. Hold your breath for 3-5 seconds or for as long as possible. Allow the piston or ball to fall to the bottom of the column. Remove the mouthpiece from your mouth and breathe out normally. Rest for a  few seconds and repeat Steps 1 through 7 at least 10 times every 1-2 hours when you are awake. Take your time and take a few normal breaths between deep breaths. The spirometer may include an indicator to show your best effort. Use the indicator as a goal to work toward during each repetition. After each set of 10 deep breaths, practice coughing to  be sure your lungs are clear. If you have an incision (the cut made at the time of surgery), support your incision when coughing by placing a pillow or rolled up towels firmly against it. Once you are able to get out of bed, walk around indoors and cough well. You may stop using the incentive spirometer when instructed by your caregiver.  RISKS AND COMPLICATIONS Take your time so you do not get dizzy or light-headed. If you are in pain, you may need to take or ask for pain medication before doing incentive spirometry. It is harder to take a deep breath if you are having pain. AFTER USE Rest and breathe slowly and easily. It can be helpful to keep track of a log of your progress. Your caregiver can provide you with a simple table to help with this. If you are using the spirometer at home, follow these instructions: [place] IF:  You are having difficultly using the spirometer. You have trouble using the spirometer as often as instructed. Your pain medication is not giving enough relief while using the spirometer. You develop fever of 100.5 F (38.1 C) or higher. SEEK IMMEDIATE MEDICAL CARE IF:  You cough up bloody sputum that had not been present before. You develop fever of 102 F (38.9 C) or greater. You develop worsening pain at or near the incision site. MAKE SURE YOU:  Understand these instructions. Will watch your condition. Will get help right away if you are not doing well or get worse. Document Released: 01/15/2007 Document Revised: 11/27/2011 Document Reviewed: 03/18/2007 [facility] Patient Information 2014 [place].   ________________________________________________________________________

## 2022-02-08 NOTE — Patient Instructions (Signed)
DUE TO COVID-19 ONLY TWO VISITORS  (aged 59 and older)  IS ALLOWED TO COME WITH YOU AND STAY IN THE WAITING ROOM ONLY DURING PRE OP AND PROCEDURE.   **NO VISITORS ARE ALLOWED IN THE SHORT STAY AREA OR RECOVERY ROOM!!**  IF YOU WILL BE ADMITTED INTO THE HOSPITAL YOU ARE ALLOWED ONLY FOUR SUPPORT PEOPLE DURING VISITATION HOURS ONLY (7 AM -8PM)   The support person(s) must pass our screening, gel in and out Visitors GUEST BADGE MUST BE WORN VISIBLY  One adult visitor may remain with you overnight and MUST be in the room by 8 P.M.   You are not required to [employer] often Do NOT share personal items Notify your provider if you are in close contact with someone who has COVID or you develop fever 100.4 or greater, new onset of sneezing, cough, sore throat, shortness of breath or body aches.        Your procedure is scheduled on:  02-17-22   Report to [facility] Main Entrance    Report to admitting at 5:15 AM   Call this number if you have problems the morning of surgery [phone]   Follow a clear liquid diet day of prep to avoid dehydration   After Midnight you may have the following liquids until 4:15 AM DAY OF SURGERY  Water Black Coffee (sugar ok, NO MILK/CREAM OR CREAMERS)  Tea (sugar ok, NO MILK/CREAM OR CREAMERS) regular and decaf                             Plain Jell-O (NO RED)                                           Fruit ices (not with fruit pulp, NO RED)                                     Popsicles (NO RED)                                                                  Juice: apple, WHITE grape, WHITE cranberry Sports drinks like Gatorade (NO RED) Clear broth(vegetable,chicken,beef)              Drink two pre-surgery clear Ensure the night before surgery (complete by 10 PM)     The day of surgery:  Drink ONE (1) Pre-Surgery Clear Ensure at 4:15 AM the morning of surgery. Drink in one sitting. Do not sip.  This drink was given to you during  your hospital  pre-op appointment visit. Nothing else to drink after completing the Pre-Surgery Clear Ensure          If you have questions, please contact your surgeon's office.   FOLLOW BOWEL PREP AND ANY ADDITIONAL PRE OP INSTRUCTIONS YOU RECEIVED FROM YOUR SURGEON'S OFFICE!!!  Dulcolax 20 mg - Give with water the day prior to surgery.   Miralax 255g - Mix with 64 oz Gatorade/Powerade.  Drink gradually over the next few hours (8 oz glass  every 15-30 minutes) until gone the day prior to surgery  Metronidazole 1000 mg - At 2 pm, 3 pm and 10 pm after Miralax bowel prep the day prior to surgery.   Neomycin 1000 mg - At 2 pm, 3 pm and 10 pm after Miralax bowel prep the day prior to surgery.      Oral Hygiene is also important to reduce your risk of infection.                                    Remember - BRUSH YOUR TEETH THE MORNING OF SURGERY WITH YOUR REGULAR TOOTHPASTE   Do NOT smoke after Midnight   Take these medicines the morning of surgery with A SIP OF WATER:  Amlodipine, Escitalopram, Gabapentin, Lorazepam, Prednisone                              You may not have any metal on your body including hair pins, jewelry, and body piercing             Do not wear make-up, lotions, powders, perfumes or deodorant  Do not wear nail polish including gel and S&S, artificial/acrylic nails, or any other type of covering on natural nails including finger and toenails. If you have artificial nails, gel coating, etc. that needs to be removed by a nail salon please have this removed prior to surgery or surgery may need to be canceled/ delayed if the surgeon/ anesthesia feels like they are unable to be safely monitored.   Do not shave  48 hours prior to surgery.            Do not bring valuables to the hospital. [place].   Contacts, dentures or bridgework may not be worn into surgery.   Bring small overnight bag day of surgery.   Special Instructions:  Bring a copy of your healthcare power of attorney and living will documents the day of surgery if you haven't scanned them before.  Please read over the following fact sheets you were given: IF YOU HAVE QUESTIONS ABOUT YOUR PRE-OP INSTRUCTIONS PLEASE CALL [place] - Preparing for Surgery Before surgery, you can play an important role.  Because skin is not sterile, your skin needs to be as free of germs as possible.  You can reduce the number of germs on your skin by washing with CHG (chlorahexidine gluconate) soap before surgery.  CHG is an antiseptic cleaner which kills germs and bonds with the skin to continue killing germs even after washing. Please DO NOT use if you have an allergy to CHG or antibacterial soaps.  If your skin becomes reddened/irritated stop using the CHG and inform your nurse when you arrive at Short Stay. Do not shave (including legs and underarms) for at least 48 hours prior to the first CHG shower.  You may shave your face/neck.  Please follow these instructions carefully:  1.  Shower with CHG Soap the night before surgery and the  morning of surgery.  2.  If you choose to wash your hair, wash your hair first as usual with your normal  shampoo.  3.  After you shampoo, rinse your hair and body thoroughly to remove the shampoo.  4.  Use CHG as you would any other liquid soap.  You can apply chg directly to the skin and wash.  Gently with a scrungie or clean washcloth.  5.  Apply the CHG Soap to your body ONLY FROM THE NECK DOWN.   Do   not use on face/ open                           Wound or open sores. Avoid contact with eyes, ears mouth and   genitals (private parts).                       Wash face,  Genitals (private parts) with your normal soap.             6.  Wash thoroughly, paying special attention to the area where your    surgery  will be performed.  7.  Thoroughly rinse your body with warm water from the neck down.  8.   DO NOT shower/wash with your normal soap after using and rinsing off the CHG Soap.                9.  Pat yourself dry with a clean towel.            10.  Wear clean pajamas.            11.  Place clean sheets on your bed the night of your first shower and do not  sleep with pets. Day of Surgery : Do not apply any lotions/deodorants the morning of surgery.  Please wear clean clothes to the hospital/surgery center.  FAILURE TO FOLLOW THESE INSTRUCTIONS MAY RESULT IN THE CANCELLATION OF YOUR SURGERY  PATIENT SIGNATURE_________________________________  NURSE SIGNATURE__________________________________  ________________________________________________________________________     Adam Phenix  An incentive spirometer is a tool that can help keep your lungs clear and active. This tool measures how well you are filling your lungs with each breath. Taking long deep breaths may help reverse or decrease the chance of developing breathing (pulmonary) problems (especially infection) following: A long period of time when you are unable to move or be active. BEFORE THE PROCEDURE  If the spirometer includes an indicator to show your best effort, your nurse or respiratory therapist will set it to a desired goal. If possible, sit up straight or lean slightly forward. Try not to slouch. Hold the incentive spirometer in an upright position. INSTRUCTIONS FOR USE  Sit on the edge of your bed if possible, or sit up as far as you can in bed or on a chair. Hold the incentive spirometer in an upright position. Breathe out normally. Place the mouthpiece in your mouth and seal your lips tightly around it. Breathe in slowly and as deeply as possible, raising the piston or the ball toward the top of the column. Hold your breath for 3-5 seconds or for as long as possible. Allow the piston or ball to fall to the bottom of the column. Remove the mouthpiece from your mouth and breathe out normally. Rest for a  few seconds and repeat Steps 1 through 7 at least 10 times every 1-2 hours when you are awake. Take your time and take a few normal breaths between deep breaths. The spirometer may include an indicator to show your best effort. Use the indicator as a goal to work toward during each repetition. After each set of 10 deep breaths, practice coughing to  be sure your lungs are clear. If you have an incision (the cut made at the time of surgery), support your incision when coughing by placing a pillow or rolled up towels firmly against it. Once you are able to get out of bed, walk around indoors and cough well. You may stop using the incentive spirometer when instructed by your caregiver.  RISKS AND COMPLICATIONS Take your time so you do not get dizzy or light-headed. If you are in pain, you may need to take or ask for pain medication before doing incentive spirometry. It is harder to take a deep breath if you are having pain. AFTER USE Rest and breathe slowly and easily. It can be helpful to keep track of a log of your progress. Your caregiver can provide you with a simple table to help with this. If you are using the spirometer at home, follow these instructions: [place] IF:  You are having difficultly using the spirometer. You have trouble using the spirometer as often as instructed. Your pain medication is not giving enough relief while using the spirometer. You develop fever of 100.5 F (38.1 C) or higher. SEEK IMMEDIATE MEDICAL CARE IF:  You cough up bloody sputum that had not been present before. You develop fever of 102 F (38.9 C) or greater. You develop worsening pain at or near the incision site. MAKE SURE YOU:  Understand these instructions. Will watch your condition. Will get help right away if you are not doing well or get worse. Document Released: 01/15/2007 Document Revised: 11/27/2011 Document Reviewed: 03/18/2007 [facility] Patient Information 2014 [place],  [place].   ________________________________________________________________________

## 2022-02-10 ENCOUNTER — Encounter (HOSPITAL_COMMUNITY)
Admission: RE | Admit: 2022-02-10 | Discharge: 2022-02-10 | Disposition: A | Discharge status: Home / Self Care | Payer: BC Managed Care – PPO | Source: Ambulatory Visit | Attending: Nurse Practitioner | Admitting: Nurse Practitioner

## 2022-02-10 NOTE — Progress Notes (Signed)
Patient was a no show for pre-surgery visit.  Left message with patient with no return call by end of shift.

## 2022-02-15 ENCOUNTER — Inpatient Hospital Stay (HOSPITAL_COMMUNITY)
Admission: RE | Admit: 2022-02-15 | Discharge: 2022-02-15 | Disposition: A | Discharge status: Home / Self Care | Payer: Self-pay | Source: Ambulatory Visit

## 2022-02-15 NOTE — Progress Notes (Addendum)
Anesthesia Review:  PCP: Cardiologist : Chest x-ray : 02/03/22- iview  EKG : 02/04/22  02/05/22- monitor  Echo : Stress test: Cardiac Cath :  Activity level:  Sleep Study/ CPAP : Fasting Blood Sugar :      / Checks Blood Sugar -- times a day:   Blood Thinner/ Instructions /Last Dose: ASA / Instructions/ Last Dose :   81 mg aspirin  LVMM on 02/15/22 at 300pm for phone call apptl  1525pm - no return phone call Called and LVMM.   Called at 330pm on 5/31 23 and made CCS aware of above.  Spoke with Merleen Nicely in Triage.  02/05/22- CMP and CBC done 02/05/22- liver enzymes elelvated.   5/17-5/22/23- in hospital with acute encephalopathy .   02/07/22- OV for a fall

## 2022-02-15 NOTE — Progress Notes (Addendum)
DUE TO COVID-19 ONLY ONE VISITOR IS ALLOWED TO COME WITH YOU AND STAY IN THE WAITING ROOM ONLY DURING PRE OP AND PROCEDURE DAY OF SURGERY.  2 VISITOR  MAY VISIT WITH YOU AFTER SURGERY IN YOUR PRIVATE ROOM DURING VISITING HOURS ONLY! YOU MAY HAVE ONE PERSON SPEND THE NITE WITH YOU IN YOUR ROOM AFTER SURGERY.      Your procedure is scheduled on:     02/17/2022   Report to [facility] Main  Entrance   Report to admitting at        0515          AM DO NOT [place], PICTURE ID OR WALLET DAY OF SURGERY.      Call this number if you have problems the morning of surgery [phone]  Clear liquid diet the day before surgery.  Mix 255 grams of Miralax with 64 ounces of gator ade and dirnk 8 ounces every 15-30 minutes until gone day before surgery.      REMEMBER: NO  SOLID FOODS , CANDY, GUM OR MINTS AFTER MIDNITE THE NITE BEFORE SURGERY .   Complete 2 pre surgery ensure dirnks at 1000 pm the nite before surgery.       [place] Kitchen CLEAR LIQUIDS UNTIL    0415am              DAY OF SURGERY.      PLEASE FINISH ENSURE DRINK PER SURGEON ORDER  WHICH NEEDS TO BE COMPLETED AT       0415am      MORNING OF SURGERY.       CLEAR LIQUID DIET   Foods Allowed      WATER BLACK COFFEE ( SUGAR OK, NO MILK, CREAM OR CREAMER) REGULAR AND DECAF  TEA ( SUGAR OK NO MILK, CREAM, OR CREAMER) REGULAR AND DECAF  PLAIN JELLO ( NO RED)  FRUIT ICES ( NO RED, NO FRUIT PULP)  POPSICLES ( NO RED)  JUICE- APPLE, WHITE GRAPE AND WHITE CRANBERRY  SPORT DRINK LIKE GATORADE ( NO RED)  CLEAR BROTH ( VEGETABLE , CHICKEN OR BEEF)                                                                     BRUSH YOUR TEETH MORNING OF SURGERY AND RINSE YOUR MOUTH OUT, NO CHEWING GUM CANDY OR MINTS.     Take these medicines the morning of surgery with A SIP OF WATER:    DO NOT TAKE ANY DIABETIC MEDICATIONS DAY OF YOUR SURGERY                               You may not have any metal on your body including hair pins and               piercings  Do not wear jewelry, make-up, lotions, powders or perfumes, deodorant             Do not wear nail polish on your fingernails.              IF YOU ARE A FEMALE AND WANT TO SHAVE UNDER ARMS OR LEGS PRIOR TO SURGERY YOU MUST DO SO AT LEAST 48  HOURS PRIOR TO SURGERY.              Men may shave face and neck.   Do not bring valuables to the hospital. [place].  Contacts, dentures or bridgework may not be worn into surgery.  Leave suitcase in the car. After surgery it may be brought to your room.     Patients discharged the day of surgery will not be allowed to drive home. IF YOU ARE HAVING SURGERY AND GOING HOME THE SAME DAY, YOU MUST HAVE AN ADULT TO DRIVE YOU HOME AND BE WITH YOU FOR 24 HOURS. YOU MAY GO HOME BY TAXI OR UBER OR ORTHERWISE, BUT AN ADULT MUST ACCOMPANY YOU HOME AND STAY WITH YOU FOR 24 HOURS.                Please read over the following fact sheets you were given: _____________________________________________________________________  [facility] - Preparing for Surgery Before surgery, you can play an important role.  Because skin is not sterile, your skin needs to be as free of germs as possible.  You can reduce the number of germs on your skin by washing with CHG (chlorahexidine gluconate) soap before surgery.  CHG is an antiseptic cleaner which kills germs and bonds with the skin to continue killing germs even after washing. Please DO NOT use if you have an allergy to CHG or antibacterial soaps.  If your skin becomes reddened/irritated stop using the CHG and inform your nurse when you arrive at Short Stay. Do not shave (including legs and underarms) for at least 48 hours prior to the first CHG shower.  You may shave your face/neck. Please follow these instructions carefully:  1.  Shower with CHG Soap the night before surgery and the  morning of Surgery.  2.  If you choose to wash your hair, wash your hair  first as usual with your  normal  shampoo.  3.  After you shampoo, rinse your hair and body thoroughly to remove the  shampoo.                           4.  Use CHG as you would any other liquid soap.  You can apply chg directly  to the skin and wash                       Gently with a scrungie or clean washcloth.  5.  Apply the CHG Soap to your body ONLY FROM THE NECK DOWN.   Do not use on face/ open                           Wound or open sores. Avoid contact with eyes, ears mouth and genitals (private parts).                       Wash face,  Genitals (private parts) with your normal soap.             6.  Wash thoroughly, paying special attention to the area where your surgery  will be performed.  7.  Thoroughly rinse your body with warm water from the neck down.  8.  DO NOT shower/wash with your normal soap after using and rinsing off  the CHG  Soap.                9.  Pat yourself dry with a clean towel.            10.  Wear clean pajamas.            11.  Place clean sheets on your bed the night of your first shower and do not  sleep with pets. Day of Surgery : Do not apply any lotions/deodorants the morning of surgery.  Please wear clean clothes to the hospital/surgery center.  FAILURE TO FOLLOW THESE INSTRUCTIONS MAY RESULT IN THE CANCELLATION OF YOUR SURGERY PATIENT SIGNATURE_________________________________  NURSE SIGNATURE__________________________________  ________________________________________________________________________

## 2022-02-16 ENCOUNTER — Encounter (HOSPITAL_COMMUNITY): Payer: Self-pay | Admitting: Surgery

## 2022-02-16 ENCOUNTER — Inpatient Hospital Stay (HOSPITAL_COMMUNITY): Admission: RE | Admit: 2022-02-16 | Payer: Self-pay | Source: Ambulatory Visit

## 2022-02-16 ENCOUNTER — Encounter (HOSPITAL_COMMUNITY): Payer: Self-pay | Admitting: Anesthesiology

## 2022-02-16 NOTE — Progress Notes (Signed)
Attempted to reach pt per mobile/home number provided. Unable to reach. LVVM. Nurse spoke with pts sister whom stated she had been trying to reach pt all day also and has been unable to reach. Informed sister of pts arrival time for tomorrows surgery.

## 2022-02-16 NOTE — Progress Notes (Signed)
Pt returned called and stated she is not planning on arriving for surgery tomorrow Friday February 17, 2022 due to insurance issues. Stated she did not notify surgeon's office of cancellation. Nurse spoke with Roselyn Reef in [place] in regards to situation whom was going to Dentist of situation.

## 2022-02-17 ENCOUNTER — Inpatient Hospital Stay (HOSPITAL_COMMUNITY): Admission: RE | Admit: 2022-02-17 | Payer: Self-pay | Source: Home / Self Care | Admitting: Surgery

## 2022-02-17 ENCOUNTER — Encounter (HOSPITAL_COMMUNITY): Admission: RE | Payer: Self-pay | Source: Home / Self Care

## 2022-02-17 DIAGNOSIS — Z01818 Encounter for other preprocedural examination: Secondary | ICD-10-CM

## 2022-02-17 SURGERY — CLOSURE, COLOSTOMY, ROBOT-ASSISTED
Anesthesia: General

## 2022-02-21 DIAGNOSIS — S0992XA Unspecified injury of nose, initial encounter: Secondary | ICD-10-CM | POA: Diagnosis not present

## 2022-02-21 DIAGNOSIS — M47812 Spondylosis without myelopathy or radiculopathy, cervical region: Secondary | ICD-10-CM | POA: Diagnosis not present

## 2022-02-21 DIAGNOSIS — M9977 Connective tissue and disc stenosis of intervertebral foramina of upper extremity: Secondary | ICD-10-CM | POA: Diagnosis not present

## 2022-02-21 DIAGNOSIS — S62337A Displaced fracture of neck of fifth metacarpal bone, left hand, initial encounter for closed fracture: Secondary | ICD-10-CM | POA: Diagnosis not present

## 2022-02-21 DIAGNOSIS — Z8781 Personal history of (healed) traumatic fracture: Secondary | ICD-10-CM | POA: Diagnosis not present

## 2022-02-21 DIAGNOSIS — M419 Scoliosis, unspecified: Secondary | ICD-10-CM | POA: Diagnosis not present

## 2022-04-26 ENCOUNTER — Other Ambulatory Visit: Payer: Self-pay

## 2022-04-28 ENCOUNTER — Telehealth (HOSPITAL_COMMUNITY): Payer: Self-pay | Admitting: Nurse Practitioner

## 2022-04-28 ENCOUNTER — Ambulatory Visit (HOSPITAL_COMMUNITY): Payer: 59 | Admitting: Nurse Practitioner

## 2022-05-04 ENCOUNTER — Other Ambulatory Visit: Payer: Self-pay | Admitting: Urology

## 2022-05-04 ENCOUNTER — Other Ambulatory Visit (HOSPITAL_COMMUNITY): Payer: Self-pay

## 2022-05-05 ENCOUNTER — Other Ambulatory Visit (HOSPITAL_COMMUNITY): Payer: Self-pay

## 2022-05-05 ENCOUNTER — Other Ambulatory Visit: Payer: Self-pay

## 2022-05-23 ENCOUNTER — Other Ambulatory Visit (HOSPITAL_COMMUNITY): Payer: 59

## 2022-05-25 NOTE — Progress Notes (Signed)
Pt. Needs orders for surgery. 

## 2022-05-25 NOTE — Patient Instructions (Addendum)
DUE TO COVID-19 ONLY TWO VISITORS  (aged 60 and older)  ARE ALLOWED TO COME WITH YOU AND STAY IN THE WAITING ROOM ONLY DURING PRE OP AND PROCEDURE.   **NO VISITORS ARE ALLOWED IN THE SHORT STAY AREA OR RECOVERY ROOM!!**  IF YOU WILL BE ADMITTED INTO THE HOSPITAL YOU ARE ALLOWED ONLY FOUR SUPPORT PEOPLE DURING VISITATION HOURS ONLY (7 AM -8PM)   The support person(s) must pass our screening, gel in and out, and wear a mask at all times, including in the patient's room. Patients must also wear a mask when staff or their support person are in the room. Visitors GUEST BADGE MUST BE WORN VISIBLY  One adult visitor may remain with you overnight and MUST be in the room by 8 P.M.     Your procedure is scheduled on: 06/01/22   Report to [facility] Main Entrance    Report to admitting at: 5:30 AM   Call this number if you have problems the morning of surgery [phone]   Clear liquids starting the day before surgery until : 4:30 AM DAY OF SURGERY  Water Black Coffee (sugar ok, NO MILK/CREAM OR CREAMERS)  Tea (sugar ok, NO MILK/CREAM OR CREAMERS) regular and decaf                             Plain Jell-O (NO RED)                                           Fruit ices (not with fruit pulp, NO RED)                                     Popsicles (NO RED)                                                                  Juice: apple, WHITE grape, WHITE cranberry Sports drinks like Gatorade (NO RED)              FOLLOW BOWEL PREP AND ANY ADDITIONAL PRE OP INSTRUCTIONS YOU RECEIVED FROM YOUR SURGEON'S OFFICE!!!   Oral Hygiene is also important to reduce your risk of infection.                                    Remember - BRUSH YOUR TEETH THE MORNING OF SURGERY WITH YOUR REGULAR TOOTHPASTE   Do NOT smoke after Midnight   Take these medicines the morning of surgery with A SIP OF WATER: lorazepam,gabapentin,escitalopram,amlodipine,prednisone.Use inhalers and eye drops as usual.  DO NOT TAKE  ANY ORAL DIABETIC MEDICATIONS DAY OF YOUR SURGERY  Bring CPAP mask and tubing day of surgery.                              You may not have any metal on your body including hair pins, jewelry, and body piercing  Do not wear make-up, lotions, powders, perfumes/cologne, or deodorant  Do not wear nail polish including gel and S&S, artificial/acrylic nails, or any other type of covering on natural nails including finger and toenails. If you have artificial nails, gel coating, etc. that needs to be removed by a nail salon please have this removed prior to surgery or surgery may need to be canceled/ delayed if the surgeon/ anesthesia feels like they are unable to be safely monitored.   Do not shave  48 hours prior to surgery.   Do not bring valuables to the hospital. [place].   Contacts, dentures or bridgework may not be worn into surgery.   Bring small overnight bag day of surgery.   DO NOT [place]. PHARMACY WILL DISPENSE MEDICATIONS LISTED ON YOUR MEDICATION LIST TO YOU DURING YOUR ADMISSION [place]!    Patients discharged on the day of surgery will not be allowed to drive home.  Someone NEEDS to stay with you for the first 24 hours after anesthesia.   Special Instructions: Bring a copy of your healthcare power of attorney and living will documents         the day of surgery if you haven't scanned them before.              Please read over the following fact sheets you were given: IF YOU HAVE QUESTIONS ABOUT YOUR PRE-OP INSTRUCTIONS PLEASE CALL [phone]     [facility] - Preparing for Surgery Before surgery, you can play an important role.  Because skin is not sterile, your skin needs to be as free of germs as possible.  You can reduce the number of germs on your skin by washing with CHG (chlorahexidine gluconate) soap before surgery.  CHG is an antiseptic cleaner which kills germs  and bonds with the skin to continue killing germs even after washing. Please DO NOT use if you have an allergy to CHG or antibacterial soaps.  If your skin becomes reddened/irritated stop using the CHG and inform your nurse when you arrive at Short Stay. Do not shave (including legs and underarms) for at least 48 hours prior to the first CHG shower.  You may shave your face/neck. Please follow these instructions carefully:  1.  Shower with CHG Soap the night before surgery and the  morning of Surgery.  2.  If you choose to wash your hair, wash your hair first as usual with your  normal  shampoo.  3.  After you shampoo, rinse your hair and body thoroughly to remove the  shampoo.                           4.  Use CHG as you would any other liquid soap.  You can apply chg directly  to the skin and wash                       Gently with a scrungie or clean washcloth.  5.  Apply the CHG Soap to your body ONLY FROM THE NECK DOWN.   Do not use on face/ open                           Wound or open sores. Avoid contact with eyes,  ears mouth and genitals (private parts).                       Wash face,  Genitals (private parts) with your normal soap.             6.  Wash thoroughly, paying special attention to the area where your surgery  will be performed.  7.  Thoroughly rinse your body with warm water from the neck down.  8.  DO NOT shower/wash with your normal soap after using and rinsing off  the CHG Soap.                9.  Pat yourself dry with a clean towel.            10.  Wear clean pajamas.            11.  Place clean sheets on your bed the night of your first shower and do not  sleep with pets. Day of Surgery : Do not apply any lotions/deodorants the morning of surgery.  Please wear clean clothes to the hospital/surgery center.  FAILURE TO FOLLOW THESE INSTRUCTIONS MAY RESULT IN THE CANCELLATION OF YOUR SURGERY PATIENT SIGNATURE_________________________________  NURSE  SIGNATURE__________________________________  ________________________________________________________________________

## 2022-05-26 ENCOUNTER — Encounter (HOSPITAL_COMMUNITY): Payer: Self-pay

## 2022-05-26 ENCOUNTER — Encounter (HOSPITAL_COMMUNITY)
Admission: RE | Admit: 2022-05-26 | Discharge: 2022-05-26 | Disposition: A | Discharge status: Home / Self Care | Payer: Commercial Managed Care - HMO | Source: Ambulatory Visit | Attending: Surgery | Admitting: Surgery

## 2022-05-26 ENCOUNTER — Other Ambulatory Visit: Payer: Self-pay

## 2022-05-26 ENCOUNTER — Ambulatory Visit: Payer: Self-pay | Admitting: Surgery

## 2022-05-26 VITALS — BP 141/78 | HR 70 | Temp 98.1°F | Ht 62.0 in | Wt 123.0 lb

## 2022-05-26 DIAGNOSIS — I159 Secondary hypertension, unspecified: Secondary | ICD-10-CM

## 2022-05-26 DIAGNOSIS — Z01812 Encounter for preprocedural laboratory examination: Secondary | ICD-10-CM | POA: Diagnosis present

## 2022-05-26 DIAGNOSIS — R739 Hyperglycemia, unspecified: Secondary | ICD-10-CM

## 2022-05-26 HISTORY — DX: Essential (primary) hypertension: I10

## 2022-05-26 LAB — CBC
HCT: 38.9 % (ref 36.0–46.0)
Hemoglobin: 12.4 g/dL (ref 12.0–15.0)
MCH: 24.3 pg — ABNORMAL LOW (ref 26.0–34.0)
MCHC: 31.9 g/dL (ref 30.0–36.0)
MCV: 76.3 fL — ABNORMAL LOW (ref 80.0–100.0)
Platelets: 302 10*3/uL (ref 150–400)
RBC: 5.1 MIL/uL (ref 3.87–5.11)
RDW: 21.2 % — ABNORMAL HIGH (ref 11.5–15.5)
WBC: 4.8 10*3/uL (ref 4.0–10.5)
nRBC: 0 % (ref 0.0–0.2)

## 2022-05-26 LAB — BASIC METABOLIC PANEL
Anion gap: 8 (ref 5–15)
BUN: 17 mg/dL (ref 6–20)
CO2: 28 mmol/L (ref 22–32)
Calcium: 9 mg/dL (ref 8.9–10.3)
Chloride: 101 mmol/L (ref 98–111)
Creatinine, Ser: 0.56 mg/dL (ref 0.44–1.00)
GFR, Estimated: >60 mL/min (ref >60)
Glucose, Bld: 77 mg/dL (ref 70–99)
Potassium: 3.3 mmol/L — ABNORMAL LOW (ref 3.5–5.1)
Sodium: 137 mmol/L (ref 135–145)

## 2022-05-26 NOTE — Progress Notes (Signed)
For Short Stay: [place] appointment date: Date of COVID positive in last 101 days:  Bowel Prep reminder:   For Anesthesia: PCP - Berkley Harvey: NP Cardiologist -   Chest x-ray - 02/03/22 EKG - 02/04/22 Stress Test -  ECHO - 02/02/22 Cardiac Cath -  Pacemaker/ICD device last checked: Pacemaker orders received: Device Rep notified:  Spinal Cord Stimulator:  Sleep Study -  CPAP -   Fasting Blood Sugar -  Checks Blood Sugar _____ times a day Date and result of last Hgb A1c-  Blood Thinner Instructions: Aspirin Instructions: Not yet Last Dose:  Activity level: Can go up a flight of stairs and activities of daily living without stopping and without chest pain and/or shortness of breath   Able to exercise without chest pain and/or shortness of breath   Unable to go up a flight of stairs without chest pain and/or shortness of breath     Anesthesia review: Hx: Smoker,HTN.  Patient denies shortness of breath, fever, cough and chest pain at PAT appointment   Patient verbalized understanding of instructions that were given to them at the PAT appointment. Patient was also instructed that they will need to review over the PAT instructions again at home before surgery.

## 2022-05-30 ENCOUNTER — Other Ambulatory Visit: Payer: Self-pay

## 2022-05-30 ENCOUNTER — Other Ambulatory Visit (HOSPITAL_COMMUNITY): Payer: Self-pay

## 2022-05-31 ENCOUNTER — Encounter (HOSPITAL_COMMUNITY): Payer: Self-pay | Admitting: Surgery

## 2022-05-31 NOTE — Anesthesia Preprocedure Evaluation (Addendum)
Anesthesia Evaluation  Patient identified by MRN, date of birth, ID band Patient awake    Reviewed: Allergy & Precautions, NPO status , Patient's Chart, lab work & pertinent test results, reviewed documented beta blocker date and time   History of Anesthesia Complications (+) PONV and history of anesthetic complications  Airway Mallampati: II  TM Distance: >3 FB Neck ROM: Full    Dental  (+) Edentulous Upper, Edentulous Lower   Pulmonary asthma , pneumonia, resolved, Current Smoker and Patient abstained from smoking.,    Pulmonary exam normal breath sounds clear to auscultation       Cardiovascular hypertension, Pt. on medications Normal cardiovascular exam Rhythm:Regular Rate:Normal     Neuro/Psych PSYCHIATRIC DISORDERS Anxiety Depression Hx/o panic attacks   GI/Hepatic Neg liver ROS, Diverticular disease S/P colostomy   Endo/Other  Hyperlipidemia  Renal/GU Renal disease  negative genitourinary   Musculoskeletal  (+) Arthritis , Osteoarthritis,    Abdominal   Peds  Hematology Lupus   Anesthesia Other Findings   Reproductive/Obstetrics                            Anesthesia Physical Anesthesia Plan  ASA: 3  Anesthesia Plan: General   Post-op Pain Management: Precedex, Dilaudid IV, Gabapentin PO (pre-op)* and Tylenol PO (pre-op)*   Induction: Intravenous  PONV Risk Score and Plan: 4 or greater and Treatment may vary due to age or medical condition, Ondansetron, Scopolamine patch - Pre-op, Midazolam and Dexamethasone  Airway Management Planned: Oral ETT  Additional Equipment: None  Intra-op Plan:   Post-operative Plan: Extubation in OR  Informed Consent: I have reviewed the patients History and Physical, chart, labs and discussed the procedure including the risks, benefits and alternatives for the proposed anesthesia with the patient or authorized representative who has indicated  his/her understanding and acceptance.       Plan Discussed with: CRNA and Anesthesiologist  Anesthesia Plan Comments:        Anesthesia Quick Evaluation

## 2022-06-01 ENCOUNTER — Inpatient Hospital Stay (HOSPITAL_COMMUNITY): Payer: Commercial Managed Care - HMO | Admitting: Certified Registered"

## 2022-06-01 ENCOUNTER — Encounter (HOSPITAL_COMMUNITY)
Admission: RE | Disposition: A | Discharge status: Home / Self Care | Payer: Self-pay | Source: Home / Self Care | Attending: Surgery

## 2022-06-01 ENCOUNTER — Other Ambulatory Visit: Payer: Self-pay

## 2022-06-01 ENCOUNTER — Encounter (HOSPITAL_COMMUNITY): Payer: Self-pay | Admitting: Surgery

## 2022-06-01 ENCOUNTER — Inpatient Hospital Stay (HOSPITAL_COMMUNITY)
Admission: RE | Admit: 2022-06-01 | Discharge: 2022-06-05 | DRG: 330 | Disposition: A | Discharge status: Home / Self Care | Payer: Commercial Managed Care - HMO | Attending: Surgery | Admitting: Surgery

## 2022-06-01 DIAGNOSIS — Z91041 Radiographic dye allergy status: Secondary | ICD-10-CM

## 2022-06-01 DIAGNOSIS — F902 Attention-deficit hyperactivity disorder, combined type: Secondary | ICD-10-CM | POA: Diagnosis present

## 2022-06-01 DIAGNOSIS — Z8719 Personal history of other diseases of the digestive system: Secondary | ICD-10-CM | POA: Diagnosis not present

## 2022-06-01 DIAGNOSIS — M1711 Unilateral primary osteoarthritis, right knee: Secondary | ICD-10-CM | POA: Diagnosis present

## 2022-06-01 DIAGNOSIS — I129 Hypertensive chronic kidney disease with stage 1 through stage 4 chronic kidney disease, or unspecified chronic kidney disease: Secondary | ICD-10-CM | POA: Diagnosis present

## 2022-06-01 DIAGNOSIS — Z433 Encounter for attention to colostomy: Principal | ICD-10-CM

## 2022-06-01 DIAGNOSIS — R739 Hyperglycemia, unspecified: Secondary | ICD-10-CM

## 2022-06-01 DIAGNOSIS — Z79899 Other long term (current) drug therapy: Secondary | ICD-10-CM

## 2022-06-01 DIAGNOSIS — I10 Essential (primary) hypertension: Secondary | ICD-10-CM | POA: Diagnosis present

## 2022-06-01 DIAGNOSIS — Z8701 Personal history of pneumonia (recurrent): Secondary | ICD-10-CM | POA: Diagnosis not present

## 2022-06-01 DIAGNOSIS — L930 Discoid lupus erythematosus: Secondary | ICD-10-CM | POA: Diagnosis present

## 2022-06-01 DIAGNOSIS — Z888 Allergy status to other drugs, medicaments and biological substances status: Secondary | ICD-10-CM

## 2022-06-01 DIAGNOSIS — Z9049 Acquired absence of other specified parts of digestive tract: Secondary | ICD-10-CM

## 2022-06-01 DIAGNOSIS — Z7982 Long term (current) use of aspirin: Secondary | ICD-10-CM

## 2022-06-01 DIAGNOSIS — Z96651 Presence of right artificial knee joint: Secondary | ICD-10-CM | POA: Diagnosis present

## 2022-06-01 DIAGNOSIS — M546 Pain in thoracic spine: Secondary | ICD-10-CM | POA: Diagnosis present

## 2022-06-01 DIAGNOSIS — N179 Acute kidney failure, unspecified: Secondary | ICD-10-CM | POA: Diagnosis present

## 2022-06-01 DIAGNOSIS — E785 Hyperlipidemia, unspecified: Secondary | ICD-10-CM | POA: Diagnosis present

## 2022-06-01 DIAGNOSIS — G8929 Other chronic pain: Secondary | ICD-10-CM | POA: Diagnosis present

## 2022-06-01 DIAGNOSIS — F419 Anxiety disorder, unspecified: Secondary | ICD-10-CM | POA: Diagnosis present

## 2022-06-01 DIAGNOSIS — K580 Irritable bowel syndrome with diarrhea: Secondary | ICD-10-CM | POA: Diagnosis present

## 2022-06-01 DIAGNOSIS — Z882 Allergy status to sulfonamides status: Secondary | ICD-10-CM

## 2022-06-01 DIAGNOSIS — J45909 Unspecified asthma, uncomplicated: Secondary | ICD-10-CM | POA: Diagnosis present

## 2022-06-01 DIAGNOSIS — Z7952 Long term (current) use of systemic steroids: Secondary | ICD-10-CM

## 2022-06-01 DIAGNOSIS — Z8619 Personal history of other infectious and parasitic diseases: Secondary | ICD-10-CM

## 2022-06-01 DIAGNOSIS — D62 Acute posthemorrhagic anemia: Secondary | ICD-10-CM | POA: Diagnosis not present

## 2022-06-01 DIAGNOSIS — F410 Panic disorder [episodic paroxysmal anxiety] without agoraphobia: Secondary | ICD-10-CM | POA: Diagnosis present

## 2022-06-01 DIAGNOSIS — F32A Depression, unspecified: Secondary | ICD-10-CM | POA: Diagnosis present

## 2022-06-01 DIAGNOSIS — Z91018 Allergy to other foods: Secondary | ICD-10-CM

## 2022-06-01 DIAGNOSIS — N182 Chronic kidney disease, stage 2 (mild): Secondary | ICD-10-CM | POA: Diagnosis present

## 2022-06-01 DIAGNOSIS — M1990 Unspecified osteoarthritis, unspecified site: Secondary | ICD-10-CM | POA: Diagnosis present

## 2022-06-01 DIAGNOSIS — K2270 Barrett's esophagus without dysplasia: Secondary | ICD-10-CM | POA: Diagnosis present

## 2022-06-01 DIAGNOSIS — K660 Peritoneal adhesions (postprocedural) (postinfection): Secondary | ICD-10-CM | POA: Diagnosis present

## 2022-06-01 DIAGNOSIS — F17210 Nicotine dependence, cigarettes, uncomplicated: Secondary | ICD-10-CM | POA: Diagnosis present

## 2022-06-01 DIAGNOSIS — K5720 Diverticulitis of large intestine with perforation and abscess without bleeding: Principal | ICD-10-CM | POA: Diagnosis present

## 2022-06-01 DIAGNOSIS — M65341 Trigger finger, right ring finger: Secondary | ICD-10-CM | POA: Diagnosis present

## 2022-06-01 DIAGNOSIS — Z9851 Tubal ligation status: Secondary | ICD-10-CM

## 2022-06-01 HISTORY — PX: XI ROBOTIC ASSISTED COLOSTOMY TAKEDOWN: SHX6828

## 2022-06-01 HISTORY — DX: Peritoneal abscess: K65.1

## 2022-06-01 HISTORY — PX: PROCTOSCOPY: SHX2266

## 2022-06-01 HISTORY — PX: LYSIS OF ADHESION: SHX5961

## 2022-06-01 LAB — HEMOGLOBIN A1C
Hgb A1c MFr Bld: 5.8 % — ABNORMAL HIGH (ref 4.8–5.6)
Mean Plasma Glucose: 119.76 mg/dL

## 2022-06-01 SURGERY — CLOSURE, COLOSTOMY, ROBOT-ASSISTED
Anesthesia: General

## 2022-06-01 MED ORDER — EPHEDRINE SULFATE-NACL 50-0.9 MG/10ML-% IV SOSY
PREFILLED_SYRINGE | INTRAVENOUS | Status: DC | PRN
  Administered 2022-06-01: 5 mg via INTRAVENOUS

## 2022-06-01 MED ORDER — MIDAZOLAM HCL 2 MG/2ML IJ SOLN
INTRAMUSCULAR | Status: AC
  Filled 2022-06-01: qty 2

## 2022-06-01 MED ORDER — SUGAMMADEX SODIUM 200 MG/2ML IV SOLN
INTRAVENOUS | Status: DC | PRN
  Administered 2022-06-01: 110 mg via INTRAVENOUS

## 2022-06-01 MED ORDER — LACTATED RINGERS IR SOLN
Status: DC | PRN
  Administered 2022-06-01: 2000 mL

## 2022-06-01 MED ORDER — BISACODYL 5 MG PO TBEC: 20.0000 mg | DELAYED_RELEASE_TABLET | Freq: Once | ORAL | Status: DC

## 2022-06-01 MED ORDER — ORAL CARE MOUTH RINSE: 15.0000 mL | Freq: Once | OROMUCOSAL | Status: AC

## 2022-06-01 MED ORDER — ALBUMIN HUMAN 5 % IV SOLN: INTRAVENOUS | Status: DC | PRN

## 2022-06-01 MED ORDER — ALBUMIN HUMAN 5 % IV SOLN
INTRAVENOUS | Status: AC
  Filled 2022-06-01: qty 500

## 2022-06-01 MED ORDER — HYDROMORPHONE HCL 1 MG/ML IJ SOLN
0.2500 mg | INTRAMUSCULAR | Status: DC | PRN
  Administered 2022-06-01 (×2): 0.5 mg via INTRAVENOUS

## 2022-06-01 MED ORDER — 0.9 % SODIUM CHLORIDE (POUR BTL) OPTIME
TOPICAL | Status: DC | PRN
  Administered 2022-06-01: 2000 mL

## 2022-06-01 MED ORDER — ONDANSETRON HCL 4 MG PO TABS
4.0000 mg | ORAL_TABLET | Freq: Four times a day (QID) | ORAL | Status: DC | PRN
  Administered 2022-06-01: 4 mg via ORAL
  Filled 2022-06-01: qty 1

## 2022-06-01 MED ORDER — SODIUM CHLORIDE 0.9 % IV SOLN
2.0000 g | INTRAVENOUS | Status: AC
  Administered 2022-06-01: 2 g via INTRAVENOUS
  Filled 2022-06-01: qty 2

## 2022-06-01 MED ORDER — CHLORHEXIDINE GLUCONATE CLOTH 2 % EX PADS: 6.0000 | MEDICATED_PAD | Freq: Once | CUTANEOUS | Status: DC

## 2022-06-01 MED ORDER — PROCHLORPERAZINE EDISYLATE 10 MG/2ML IJ SOLN: 5.0000 mg | Freq: Four times a day (QID) | INTRAMUSCULAR | Status: DC | PRN

## 2022-06-01 MED ORDER — ENSURE PRE-SURGERY PO LIQD
592.0000 mL | Freq: Once | ORAL | Status: DC
  Filled 2022-06-01: qty 592

## 2022-06-01 MED ORDER — SODIUM CHLORIDE 0.9 % IV SOLN: 250.0000 mL | INTRAVENOUS | Status: DC | PRN

## 2022-06-01 MED ORDER — ESCITALOPRAM OXALATE 20 MG PO TABS
20.0000 mg | ORAL_TABLET | Freq: Every evening | ORAL | Status: DC
  Administered 2022-06-01 – 2022-06-04 (×4): 20 mg via ORAL
  Filled 2022-06-01 (×4): qty 1

## 2022-06-01 MED ORDER — PROPOFOL 10 MG/ML IV BOLUS
INTRAVENOUS | Status: DC | PRN
  Administered 2022-06-01: 120 mg via INTRAVENOUS

## 2022-06-01 MED ORDER — POLYETHYLENE GLYCOL 3350 17 GM/SCOOP PO POWD: 1.0000 | Freq: Once | ORAL | Status: DC

## 2022-06-01 MED ORDER — ALUM & MAG HYDROXIDE-SIMETH 200-200-20 MG/5ML PO SUSP: 30.0000 mL | Freq: Four times a day (QID) | ORAL | Status: DC | PRN

## 2022-06-01 MED ORDER — ONDANSETRON HCL 4 MG/2ML IJ SOLN
INTRAMUSCULAR | Status: DC | PRN
  Administered 2022-06-01: 4 mg via INTRAVENOUS

## 2022-06-01 MED ORDER — ENOXAPARIN SODIUM 40 MG/0.4ML IJ SOSY
40.0000 mg | PREFILLED_SYRINGE | Freq: Once | INTRAMUSCULAR | Status: AC
  Administered 2022-06-01: 40 mg via SUBCUTANEOUS
  Filled 2022-06-01: qty 0.4

## 2022-06-01 MED ORDER — TRAMADOL HCL 50 MG PO TABS
ORAL_TABLET | ORAL | Status: AC
  Filled 2022-06-01: qty 1

## 2022-06-01 MED ORDER — SIMETHICONE 80 MG PO CHEW
40.0000 mg | CHEWABLE_TABLET | Freq: Four times a day (QID) | ORAL | Status: DC | PRN
  Administered 2022-06-03: 40 mg via ORAL
  Filled 2022-06-01: qty 1

## 2022-06-01 MED ORDER — ENSURE PRE-SURGERY PO LIQD
296.0000 mL | Freq: Once | ORAL | Status: DC
  Filled 2022-06-01: qty 296

## 2022-06-01 MED ORDER — PROPOFOL 10 MG/ML IV BOLUS
INTRAVENOUS | Status: AC
  Filled 2022-06-01: qty 20

## 2022-06-01 MED ORDER — ONDANSETRON HCL 4 MG/2ML IJ SOLN
INTRAMUSCULAR | Status: AC
  Filled 2022-06-01: qty 2

## 2022-06-01 MED ORDER — METHOCARBAMOL 1000 MG/10ML IJ SOLN: 1000.0000 mg | Freq: Four times a day (QID) | INTRAVENOUS | Status: DC | PRN

## 2022-06-01 MED ORDER — HYDROMORPHONE HCL 1 MG/ML IJ SOLN
0.5000 mg | INTRAMUSCULAR | Status: DC | PRN
  Administered 2022-06-01 – 2022-06-03 (×8): 1 mg via INTRAVENOUS
  Administered 2022-06-03: 0.5 mg via INTRAVENOUS
  Administered 2022-06-03 – 2022-06-04 (×3): 1 mg via INTRAVENOUS
  Administered 2022-06-04: 0.5 mg via INTRAVENOUS
  Administered 2022-06-04 – 2022-06-05 (×2): 1 mg via INTRAVENOUS
  Filled 2022-06-01 (×14): qty 1

## 2022-06-01 MED ORDER — HYDRALAZINE HCL 20 MG/ML IJ SOLN
10.0000 mg | INTRAMUSCULAR | Status: DC | PRN
  Filled 2022-06-01: qty 1

## 2022-06-01 MED ORDER — ASPIRIN 81 MG PO TBEC
81.0000 mg | DELAYED_RELEASE_TABLET | Freq: Every morning | ORAL | Status: DC
  Administered 2022-06-02 – 2022-06-05 (×4): 81 mg via ORAL
  Filled 2022-06-01 (×4): qty 1

## 2022-06-01 MED ORDER — FENTANYL CITRATE (PF) 250 MCG/5ML IJ SOLN
INTRAMUSCULAR | Status: DC | PRN
  Administered 2022-06-01 (×3): 50 ug via INTRAVENOUS
  Administered 2022-06-01: 100 ug via INTRAVENOUS
  Administered 2022-06-01: 50 ug via INTRAVENOUS

## 2022-06-01 MED ORDER — POLYVINYL ALCOHOL 1.4 % OP SOLN: 1.0000 [drp] | Freq: Three times a day (TID) | OPHTHALMIC | Status: DC | PRN

## 2022-06-01 MED ORDER — ENOXAPARIN SODIUM 40 MG/0.4ML IJ SOSY: 40.0000 mg | PREFILLED_SYRINGE | INTRAMUSCULAR | Status: DC

## 2022-06-01 MED ORDER — ALVIMOPAN 12 MG PO CAPS: 12.0000 mg | ORAL_CAPSULE | Freq: Two times a day (BID) | ORAL | Status: DC

## 2022-06-01 MED ORDER — BUPIVACAINE-EPINEPHRINE (PF) 0.25% -1:200000 IJ SOLN
INTRAMUSCULAR | Status: AC
  Filled 2022-06-01: qty 30

## 2022-06-01 MED ORDER — SODIUM CHLORIDE 0.9% FLUSH: 3.0000 mL | INTRAVENOUS | Status: DC | PRN

## 2022-06-01 MED ORDER — BUPIVACAINE LIPOSOME 1.3 % IJ SUSP
INTRAMUSCULAR | Status: DC | PRN
  Administered 2022-06-01: 20 mL

## 2022-06-01 MED ORDER — ENALAPRILAT 1.25 MG/ML IV SOLN: 0.6250 mg | Freq: Four times a day (QID) | INTRAVENOUS | Status: DC | PRN

## 2022-06-01 MED ORDER — CARBOXYMETHYLCELLULOSE SODIUM 0.5 % OP SOLN: 1.0000 [drp] | Freq: Three times a day (TID) | OPHTHALMIC | Status: DC | PRN

## 2022-06-01 MED ORDER — ROCURONIUM BROMIDE 10 MG/ML (PF) SYRINGE
PREFILLED_SYRINGE | INTRAVENOUS | Status: AC
  Filled 2022-06-01: qty 10

## 2022-06-01 MED ORDER — MAGIC MOUTHWASH: 15.0000 mL | Freq: Four times a day (QID) | ORAL | Status: DC | PRN

## 2022-06-01 MED ORDER — HYDROMORPHONE HCL 1 MG/ML IJ SOLN
INTRAMUSCULAR | Status: AC
  Filled 2022-06-01: qty 1

## 2022-06-01 MED ORDER — ROCURONIUM BROMIDE 10 MG/ML (PF) SYRINGE
PREFILLED_SYRINGE | INTRAVENOUS | Status: DC | PRN
  Administered 2022-06-01: 10 mg via INTRAVENOUS
  Administered 2022-06-01: 20 mg via INTRAVENOUS
  Administered 2022-06-01: 70 mg via INTRAVENOUS

## 2022-06-01 MED ORDER — HYDROMORPHONE HCL 1 MG/ML IJ SOLN
INTRAMUSCULAR | Status: AC
  Administered 2022-06-01: 0.5 mg via INTRAVENOUS
  Filled 2022-06-01: qty 1

## 2022-06-01 MED ORDER — SODIUM CHLORIDE 0.9 % IV SOLN: Freq: Three times a day (TID) | INTRAVENOUS | Status: DC | PRN

## 2022-06-01 MED ORDER — DIPHENHYDRAMINE HCL 12.5 MG/5ML PO ELIX: 12.5000 mg | ORAL_SOLUTION | Freq: Four times a day (QID) | ORAL | Status: DC | PRN

## 2022-06-01 MED ORDER — ACETAMINOPHEN 500 MG PO TABS
1000.0000 mg | ORAL_TABLET | ORAL | Status: AC
  Administered 2022-06-01: 1000 mg via ORAL
  Filled 2022-06-01: qty 2

## 2022-06-01 MED ORDER — DIPHENHYDRAMINE HCL 50 MG/ML IJ SOLN: 12.5000 mg | Freq: Four times a day (QID) | INTRAMUSCULAR | Status: DC | PRN

## 2022-06-01 MED ORDER — GABAPENTIN 400 MG PO CAPS
800.0000 mg | ORAL_CAPSULE | Freq: Three times a day (TID) | ORAL | Status: DC
  Administered 2022-06-01 – 2022-06-04 (×11): 800 mg via ORAL
  Filled 2022-06-01 (×11): qty 2

## 2022-06-01 MED ORDER — FENTANYL CITRATE (PF) 100 MCG/2ML IJ SOLN
INTRAMUSCULAR | Status: AC
  Filled 2022-06-01: qty 2

## 2022-06-01 MED ORDER — ALVIMOPAN 12 MG PO CAPS
12.0000 mg | ORAL_CAPSULE | ORAL | Status: AC
  Administered 2022-06-01: 12 mg via ORAL
  Filled 2022-06-01: qty 1

## 2022-06-01 MED ORDER — BUPIVACAINE LIPOSOME 1.3 % IJ SUSP: 20.0000 mL | Freq: Once | INTRAMUSCULAR | Status: DC

## 2022-06-01 MED ORDER — PHENYLEPHRINE HCL-NACL 20-0.9 MG/250ML-% IV SOLN
INTRAVENOUS | Status: DC | PRN
  Administered 2022-06-01: 50 ug/min via INTRAVENOUS

## 2022-06-01 MED ORDER — LACTATED RINGERS IV BOLUS: 1000.0000 mL | Freq: Three times a day (TID) | INTRAVENOUS | Status: AC | PRN

## 2022-06-01 MED ORDER — METOPROLOL TARTRATE 5 MG/5ML IV SOLN: 5.0000 mg | Freq: Four times a day (QID) | INTRAVENOUS | Status: DC | PRN

## 2022-06-01 MED ORDER — BUPIVACAINE-EPINEPHRINE (PF) 0.25% -1:200000 IJ SOLN
INTRAMUSCULAR | Status: DC | PRN
  Administered 2022-06-01: 30 mL

## 2022-06-01 MED ORDER — MIDAZOLAM HCL 2 MG/2ML IJ SOLN
INTRAMUSCULAR | Status: DC | PRN
  Administered 2022-06-01 (×2): 1 mg via INTRAVENOUS

## 2022-06-01 MED ORDER — LACTATED RINGERS IV SOLN: INTRAVENOUS | Status: DC

## 2022-06-01 MED ORDER — CHLORHEXIDINE GLUCONATE 0.12 % MT SOLN
15.0000 mL | Freq: Once | OROMUCOSAL | Status: AC
  Administered 2022-06-01: 15 mL via OROMUCOSAL

## 2022-06-01 MED ORDER — SODIUM CHLORIDE 0.9 % IR SOLN
Status: DC | PRN
  Administered 2022-06-01: 1000 mL via INTRAVESICAL

## 2022-06-01 MED ORDER — STERILE WATER FOR INJECTION IJ SOLN
INTRAMUSCULAR | Status: DC | PRN
  Administered 2022-06-01: 15 mL

## 2022-06-01 MED ORDER — GABAPENTIN 300 MG PO CAPS
300.0000 mg | ORAL_CAPSULE | ORAL | Status: AC
  Administered 2022-06-01: 300 mg via ORAL
  Filled 2022-06-01: qty 1

## 2022-06-01 MED ORDER — TRAMADOL HCL 50 MG PO TABS
50.0000 mg | ORAL_TABLET | Freq: Four times a day (QID) | ORAL | Status: DC | PRN
  Administered 2022-06-01: 100 mg via ORAL
  Administered 2022-06-01: 50 mg via ORAL
  Administered 2022-06-02 – 2022-06-04 (×6): 100 mg via ORAL
  Administered 2022-06-04: 50 mg via ORAL
  Administered 2022-06-04 – 2022-06-05 (×3): 100 mg via ORAL
  Filled 2022-06-01 (×11): qty 2

## 2022-06-01 MED ORDER — CALCIUM POLYCARBOPHIL 625 MG PO TABS
625.0000 mg | ORAL_TABLET | Freq: Two times a day (BID) | ORAL | Status: DC
  Administered 2022-06-01 – 2022-06-05 (×8): 625 mg via ORAL
  Filled 2022-06-01 (×8): qty 1

## 2022-06-01 MED ORDER — DEXAMETHASONE SODIUM PHOSPHATE 10 MG/ML IJ SOLN
INTRAMUSCULAR | Status: AC
  Filled 2022-06-01: qty 1

## 2022-06-01 MED ORDER — AMISULPRIDE (ANTIEMETIC) 5 MG/2ML IV SOLN: 10.0000 mg | Freq: Once | INTRAVENOUS | Status: DC | PRN

## 2022-06-01 MED ORDER — BUPIVACAINE LIPOSOME 1.3 % IJ SUSP
INTRAMUSCULAR | Status: AC
  Filled 2022-06-01: qty 20

## 2022-06-01 MED ORDER — LORAZEPAM 0.5 MG PO TABS
0.5000 mg | ORAL_TABLET | Freq: Every evening | ORAL | Status: DC | PRN
  Administered 2022-06-01 – 2022-06-04 (×2): 0.5 mg via ORAL
  Filled 2022-06-01 (×2): qty 1

## 2022-06-01 MED ORDER — METHOCARBAMOL 500 MG PO TABS
1000.0000 mg | ORAL_TABLET | Freq: Four times a day (QID) | ORAL | Status: DC | PRN
  Administered 2022-06-03 – 2022-06-05 (×4): 1000 mg via ORAL
  Filled 2022-06-01 (×4): qty 2

## 2022-06-01 MED ORDER — METRONIDAZOLE 500 MG PO TABS: 1000.0000 mg | ORAL_TABLET | ORAL | Status: DC

## 2022-06-01 MED ORDER — MELATONIN 3 MG PO TABS: 3.0000 mg | ORAL_TABLET | Freq: Every evening | ORAL | Status: DC | PRN

## 2022-06-01 MED ORDER — METHOCARBAMOL 500 MG IVPB - SIMPLE MED
INTRAVENOUS | Status: AC
  Administered 2022-06-01: 500 mg
  Filled 2022-06-01: qty 55

## 2022-06-01 MED ORDER — NEOMYCIN SULFATE 500 MG PO TABS: 1000.0000 mg | ORAL_TABLET | ORAL | Status: DC

## 2022-06-01 MED ORDER — LIDOCAINE 2% (20 MG/ML) 5 ML SYRINGE
INTRAMUSCULAR | Status: DC | PRN
  Administered 2022-06-01: 40 mg via INTRAVENOUS

## 2022-06-01 MED ORDER — PROCHLORPERAZINE MALEATE 10 MG PO TABS: 10.0000 mg | ORAL_TABLET | Freq: Four times a day (QID) | ORAL | Status: DC | PRN

## 2022-06-01 MED ORDER — DEXMEDETOMIDINE HCL IN NACL 80 MCG/20ML IV SOLN
INTRAVENOUS | Status: DC | PRN
  Administered 2022-06-01 (×3): 4 ug via BUCCAL

## 2022-06-01 MED ORDER — PREDNISONE 5 MG PO TABS
10.0000 mg | ORAL_TABLET | Freq: Every day | ORAL | Status: DC
  Administered 2022-06-02 – 2022-06-05 (×4): 10 mg via ORAL
  Filled 2022-06-01 (×4): qty 2

## 2022-06-01 MED ORDER — LACTATED RINGERS IV SOLN: INTRAVENOUS | Status: DC | PRN

## 2022-06-01 MED ORDER — DEXAMETHASONE SODIUM PHOSPHATE 10 MG/ML IJ SOLN
INTRAMUSCULAR | Status: DC | PRN
  Administered 2022-06-01: 4 mg via INTRAVENOUS

## 2022-06-01 MED ORDER — LIP MEDEX EX OINT
TOPICAL_OINTMENT | Freq: Two times a day (BID) | CUTANEOUS | Status: DC
  Administered 2022-06-01: 75 via TOPICAL
  Administered 2022-06-02 – 2022-06-04 (×3): 1 via TOPICAL
  Filled 2022-06-01 (×2): qty 7

## 2022-06-01 MED ORDER — ALBUTEROL SULFATE (2.5 MG/3ML) 0.083% IN NEBU: 2.5000 mg | INHALATION_SOLUTION | RESPIRATORY_TRACT | Status: DC | PRN

## 2022-06-01 MED ORDER — TRAMADOL HCL 50 MG PO TABS: 50.0000 mg | ORAL_TABLET | Freq: Four times a day (QID) | ORAL | 0 refills | Status: DC | PRN

## 2022-06-01 MED ORDER — ONDANSETRON HCL 4 MG/2ML IJ SOLN: 4.0000 mg | Freq: Four times a day (QID) | INTRAMUSCULAR | Status: DC | PRN

## 2022-06-01 MED ORDER — SODIUM CHLORIDE 0.9% FLUSH
3.0000 mL | Freq: Two times a day (BID) | INTRAVENOUS | Status: DC
  Administered 2022-06-03 – 2022-06-05 (×3): 3 mL via INTRAVENOUS

## 2022-06-01 MED ORDER — ACETAMINOPHEN 500 MG PO TABS
1000.0000 mg | ORAL_TABLET | Freq: Four times a day (QID) | ORAL | Status: DC
  Administered 2022-06-01 – 2022-06-05 (×13): 1000 mg via ORAL
  Filled 2022-06-01 (×14): qty 2

## 2022-06-01 MED ORDER — STERILE WATER FOR INJECTION IJ SOLN
INTRAMUSCULAR | Status: AC
  Filled 2022-06-01: qty 10

## 2022-06-01 MED ORDER — ONDANSETRON HCL 4 MG/2ML IJ SOLN: 4.0000 mg | Freq: Once | INTRAMUSCULAR | Status: DC | PRN

## 2022-06-01 MED ORDER — PHENYLEPHRINE 80 MCG/ML (10ML) SYRINGE FOR IV PUSH (FOR BLOOD PRESSURE SUPPORT)
PREFILLED_SYRINGE | INTRAVENOUS | Status: DC | PRN
  Administered 2022-06-01: 80 ug via INTRAVENOUS
  Administered 2022-06-01 (×2): 120 ug via INTRAVENOUS
  Administered 2022-06-01 (×2): 80 ug via INTRAVENOUS

## 2022-06-01 MED ORDER — SODIUM CHLORIDE 0.9 % IV SOLN
2.0000 g | Freq: Two times a day (BID) | INTRAVENOUS | Status: AC
  Administered 2022-06-01: 2 g via INTRAVENOUS
  Filled 2022-06-01: qty 2

## 2022-06-01 SURGICAL SUPPLY — 132 items
APL PRP STRL LF DISP 70% ISPRP (MISCELLANEOUS)
APPLIER CLIP 5 13 M/L LIGAMAX5 (MISCELLANEOUS)
APPLIER CLIP ROT 10 11.4 M/L (STAPLE)
APR CLP MED LRG 11.4X10 (STAPLE)
APR CLP MED LRG 5 ANG JAW (MISCELLANEOUS)
BAG COUNTER SPONGE SURGICOUNT (BAG) ×2 IMPLANT
BAG SPNG CNTER NS LX DISP (BAG) ×1
BAG URO CATCHER STRL LF (MISCELLANEOUS) ×2 IMPLANT
BLADE EXTENDED COATED 6.5IN (ELECTRODE) IMPLANT
CANNULA REDUC XI 12-8 STAPL (CANNULA)
CANNULA REDUCER 12-8 DVNC XI (CANNULA) IMPLANT
CATH URETL OPEN 5X70 (CATHETERS) IMPLANT
CELLS DAT CNTRL 66122 CELL SVR (MISCELLANEOUS) IMPLANT
CHLORAPREP W/TINT 26 (MISCELLANEOUS) IMPLANT
CLIP APPLIE 5 13 M/L LIGAMAX5 (MISCELLANEOUS) IMPLANT
CLIP APPLIE ROT 10 11.4 M/L (STAPLE) IMPLANT
CLOTH BEACON ORANGE TIMEOUT ST (SAFETY) ×2 IMPLANT
COVER SURGICAL LIGHT HANDLE (MISCELLANEOUS) ×4 IMPLANT
COVER TIP SHEARS 8 DVNC (MISCELLANEOUS) ×2 IMPLANT
COVER TIP SHEARS 8MM DA VINCI (MISCELLANEOUS) ×1
DEVICE TROCAR PUNCTURE CLOSURE (ENDOMECHANICALS) IMPLANT
DRAIN CHANNEL 19F RND (DRAIN) IMPLANT
DRAPE ARM DVNC X/XI (DISPOSABLE) ×8 IMPLANT
DRAPE COLUMN DVNC XI (DISPOSABLE) ×2 IMPLANT
DRAPE DA VINCI XI ARM (DISPOSABLE) ×4
DRAPE DA VINCI XI COLUMN (DISPOSABLE) ×1
DRAPE SURG IRRIG POUCH 19X23 (DRAPES) ×2 IMPLANT
DRSG OPSITE POSTOP 3X4 (GAUZE/BANDAGES/DRESSINGS) IMPLANT
DRSG OPSITE POSTOP 4X10 (GAUZE/BANDAGES/DRESSINGS) IMPLANT
DRSG OPSITE POSTOP 4X6 (GAUZE/BANDAGES/DRESSINGS) IMPLANT
DRSG OPSITE POSTOP 4X8 (GAUZE/BANDAGES/DRESSINGS) IMPLANT
DRSG TEGADERM 2-3/8X2-3/4 SM (GAUZE/BANDAGES/DRESSINGS) ×10 IMPLANT
DRSG TEGADERM 4X4.75 (GAUZE/BANDAGES/DRESSINGS) IMPLANT
ELECT PENCIL ROCKER SW 15FT (MISCELLANEOUS) ×2 IMPLANT
ELECT REM PT RETURN 15FT ADLT (MISCELLANEOUS) ×2 IMPLANT
ENDOLOOP SUT PDS II  0 18 (SUTURE)
ENDOLOOP SUT PDS II 0 18 (SUTURE) IMPLANT
EVACUATOR SILICONE 100CC (DRAIN) IMPLANT
GAUZE SPONGE 2X2 8PLY STRL LF (GAUZE/BANDAGES/DRESSINGS) ×2 IMPLANT
GLOVE ECLIPSE 8.0 STRL XLNG CF (GLOVE) ×6 IMPLANT
GLOVE INDICATOR 8.0 STRL GRN (GLOVE) ×6 IMPLANT
GLOVE SURG LX STRL 7.5 STRW (GLOVE) ×2 IMPLANT
GOWN SRG XL LVL 4 BRTHBL STRL (GOWNS) ×2 IMPLANT
GOWN STRL NON-REIN XL LVL4 (GOWNS) ×1
GOWN STRL REUS W/ TWL XL LVL3 (GOWN DISPOSABLE) ×10 IMPLANT
GOWN STRL REUS W/TWL XL LVL3 (GOWN DISPOSABLE) ×5
GRASPER SUT TROCAR 14GX15 (MISCELLANEOUS) IMPLANT
HOLDER FOLEY CATH W/STRAP (MISCELLANEOUS) ×2 IMPLANT
IRRIG SUCT STRYKERFLOW 2 WTIP (MISCELLANEOUS) ×1
IRRIGATION SUCT STRKRFLW 2 WTP (MISCELLANEOUS) ×2 IMPLANT
KIT PROCEDURE DA VINCI SI (MISCELLANEOUS) ×1
KIT PROCEDURE DVNC SI (MISCELLANEOUS) IMPLANT
KIT SIGMOIDOSCOPE (SET/KITS/TRAYS/PACK) IMPLANT
KIT TURNOVER KIT A (KITS) IMPLANT
MANIFOLD NEPTUNE II (INSTRUMENTS) ×2 IMPLANT
NDL ASPIRATION 22 (NEEDLE) ×2 IMPLANT
NDL INSUFFLATION 14GA 120MM (NEEDLE) ×2 IMPLANT
NDL SAFETY ECLIP 18X1.5 (MISCELLANEOUS) IMPLANT
NEEDLE ASPIRATION 22 (NEEDLE) ×1 IMPLANT
NEEDLE INSUFFLATION 14GA 120MM (NEEDLE) ×1 IMPLANT
PACK CARDIOVASCULAR III (CUSTOM PROCEDURE TRAY) ×2 IMPLANT
PACK COLON (CUSTOM PROCEDURE TRAY) ×2 IMPLANT
PACK CYSTO (CUSTOM PROCEDURE TRAY) ×2 IMPLANT
PAD POSITIONING PINK XL (MISCELLANEOUS) ×2 IMPLANT
PENCIL SMOKE EVACUATOR (MISCELLANEOUS) IMPLANT
PROTECTOR NERVE ULNAR (MISCELLANEOUS) ×4 IMPLANT
RELOAD STAPLE 45 3.5 BLU DVNC (STAPLE) IMPLANT
RELOAD STAPLE 45 4.3 GRN DVNC (STAPLE) IMPLANT
RELOAD STAPLE 60 3.5 BLU DVNC (STAPLE) IMPLANT
RELOAD STAPLE 60 4.3 GRN DVNC (STAPLE) IMPLANT
RELOAD STAPLER 3.5X45 BLU DVNC (STAPLE) IMPLANT
RELOAD STAPLER 3.5X60 BLU DVNC (STAPLE) IMPLANT
RELOAD STAPLER 4.3X45 GRN DVNC (STAPLE) IMPLANT
RELOAD STAPLER 4.3X60 GRN DVNC (STAPLE) IMPLANT
RETRACTOR WND ALEXIS 18 MED (MISCELLANEOUS) IMPLANT
RTRCTR WOUND ALEXIS 18CM MED (MISCELLANEOUS)
SCISSORS LAP 5X35 DISP (ENDOMECHANICALS) ×2 IMPLANT
SEAL CANN UNIV 5-8 DVNC XI (MISCELLANEOUS) ×6 IMPLANT
SEAL XI 5MM-8MM UNIVERSAL (MISCELLANEOUS) ×3
SEALER VESSEL DA VINCI XI (MISCELLANEOUS) ×1
SEALER VESSEL EXT DVNC XI (MISCELLANEOUS) ×2 IMPLANT
SOLUTION ELECTROLUBE (MISCELLANEOUS) ×2 IMPLANT
SPIKE FLUID TRANSFER (MISCELLANEOUS) ×2 IMPLANT
SPONGE GAUZE 2X2 8PLY STRL LF (GAUZE/BANDAGES/DRESSINGS) IMPLANT
STAPLER 45 DA VINCI SURE FORM (STAPLE)
STAPLER 45 SUREFORM DVNC (STAPLE) IMPLANT
STAPLER 60 DA VINCI SURE FORM (STAPLE)
STAPLER 60 SUREFORM DVNC (STAPLE) IMPLANT
STAPLER CANNULA SEAL DVNC XI (STAPLE) ×2 IMPLANT
STAPLER CANNULA SEAL XI (STAPLE) ×1
STAPLER ECHELON POWER CIR 29 (STAPLE) IMPLANT
STAPLER ECHELON POWER CIR 31 (STAPLE) IMPLANT
STAPLER RELOAD 3.5X45 BLU DVNC (STAPLE)
STAPLER RELOAD 3.5X45 BLUE (STAPLE)
STAPLER RELOAD 3.5X60 BLU DVNC (STAPLE)
STAPLER RELOAD 3.5X60 BLUE (STAPLE)
STAPLER RELOAD 4.3X45 GREEN (STAPLE)
STAPLER RELOAD 4.3X45 GRN DVNC (STAPLE)
STAPLER RELOAD 4.3X60 GREEN (STAPLE)
STAPLER RELOAD 4.3X60 GRN DVNC (STAPLE)
STOPCOCK 4 WAY LG BORE MALE ST (IV SETS) ×4 IMPLANT
SURGILUBE 2OZ TUBE FLIPTOP (MISCELLANEOUS) IMPLANT
SUT MNCRL AB 4-0 PS2 18 (SUTURE) ×2 IMPLANT
SUT PDS AB 1 CT1 27 (SUTURE) ×4 IMPLANT
SUT PROLENE 0 CT 2 (SUTURE) IMPLANT
SUT PROLENE 2 0 KS (SUTURE) IMPLANT
SUT PROLENE 2 0 SH DA (SUTURE) IMPLANT
SUT SILK 2 0 (SUTURE)
SUT SILK 2 0 SH CR/8 (SUTURE) IMPLANT
SUT SILK 2-0 18XBRD TIE 12 (SUTURE) IMPLANT
SUT SILK 3 0 (SUTURE)
SUT SILK 3 0 SH CR/8 (SUTURE) ×2 IMPLANT
SUT SILK 3-0 18XBRD TIE 12 (SUTURE) IMPLANT
SUT V-LOC BARB 180 2/0GR6 GS22 (SUTURE)
SUT VIC AB 3-0 SH 18 (SUTURE) IMPLANT
SUT VIC AB 3-0 SH 27 (SUTURE)
SUT VIC AB 3-0 SH 27XBRD (SUTURE) IMPLANT
SUT VICRYL 0 UR6 27IN ABS (SUTURE) ×2 IMPLANT
SUTURE V-LC BRB 180 2/0GR6GS22 (SUTURE) IMPLANT
SYR 10ML ECCENTRIC (SYRINGE) ×2 IMPLANT
SYR CONTROL 10ML LL (SYRINGE) IMPLANT
SYS LAPSCP GELPORT 120MM (MISCELLANEOUS)
SYS WOUND ALEXIS 18CM MED (MISCELLANEOUS) ×1
SYSTEM LAPSCP GELPORT 120MM (MISCELLANEOUS) IMPLANT
SYSTEM WOUND ALEXIS 18CM MED (MISCELLANEOUS) ×2 IMPLANT
TAPE UMBILICAL 1/8X18 COTTON (MISCELLANEOUS) IMPLANT
TOWEL OR NON WOVEN STRL DISP B (DISPOSABLE) ×2 IMPLANT
TRAY FOLEY MTR SLVR 16FR STAT (SET/KITS/TRAYS/PACK) ×2 IMPLANT
TROCAR ADV FIXATION 5X100MM (TROCAR) ×2 IMPLANT
TUBING CONNECTING 10 (TUBING) ×4 IMPLANT
TUBING INSUFFLATION 10FT LAP (TUBING) ×2 IMPLANT
WATER STERILE IRR 3000ML UROMA (IV SOLUTION) ×2 IMPLANT

## 2022-06-01 NOTE — Transfer of Care (Signed)
Immediate Anesthesia Transfer of Care Note  Patient: Brittany Hobbs  Procedure(s) Performed: XI ROBOTIC ASSISTED COLOSTOMY TAKEDOWN LYSIS OF ADHESION RIGID PROCTOSCOPY CYSTOSCOPY with FIREFLY INJECTION  Patient Location: PACU  Anesthesia Type:General  Level of Consciousness: awake, alert  and patient cooperative  Airway & Oxygen Therapy: Patient Spontanous Breathing and Patient connected to face mask oxygen  Post-op Assessment: Report given to RN and Post -op Vital signs reviewed and stable  Post vital signs: Reviewed and stable  Last Vitals:  Vitals Value Taken Time  BP 172/90 06/01/22 1117  Temp    Pulse 82 06/01/22 1119  Resp 19 06/01/22 1119  SpO2 100 % 06/01/22 1119  Vitals shown include unvalidated device data.  Last Pain:  Vitals:   06/01/22 0558  TempSrc: Oral  PainSc:          Complications: No notable events documented.

## 2022-06-01 NOTE — Progress Notes (Signed)
MD Marcello Moores paged about patient asking for Albuterol inhaler. Verbal order given.

## 2022-06-01 NOTE — Op Note (Signed)
Preoperative diagnosis: History of diverticulitis status post colostomy diversion  Postoperative diagnosis: History of diverticulitis status post colostomy diversion  Procedures: Cystoscopy with bilateral intraureteral retrograde injection of indocyanine green dye  Surgeon: Pryor Curia. MD  Anesthesia: General  Complications: None  EBL: None  Specimens: None  Indication: Brittany Hobbs is a 61 year old female with a history of perforated diverticulitis status post colostomy bowel diversion.  She presents today for colostomy reversal.  At the request of general surgery, urology was requested to perform injection of intraureteral indocyanine green dye for intraoperative identification of the ureters bilaterally.  The potential risks, complications, expected recovery process of this procedure was discussed in detail with the patient.  She gave informed consent.  Description of procedure: The patient was taken to the operating room and a general anesthetic was administered.  She was given preoperative antibiotics, placed in the dorsolithotomy position, and prepped and draped in usual sterile fashion.  Next, a preoperative timeout was performed.  Cystourethroscopy was performed.  This revealed a normal urethra.  Systematic examination of bladder revealed no bladder tumors, stones, or other mucosal pathology.  The ureteral orifice ease were identified in their expected anatomic location.  A 5 French ureteral catheter that was then used to intubate the right ureter.  This was advanced slightly and approximately 7.5 cc of indocyanine green dye was injected.  An identical procedure was then performed on the contralateral side.  A 16 French Foley catheter was then inserted into the bladder.  This procedure was without complications.  It was then turned over to Dr. Johney Maine for the remainder of the procedure.
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
## 2022-06-01 NOTE — Anesthesia Postprocedure Evaluation (Signed)
Anesthesia Post Note  Patient: Zannie Cove  Procedure(s) Performed: XI ROBOTIC ASSISTED COLOSTOMY TAKEDOWN LYSIS OF ADHESION RIGID PROCTOSCOPY CYSTOSCOPY with FIREFLY INJECTION     Patient location during evaluation: PACU Anesthesia Type: General Level of consciousness: awake and alert and oriented Pain management: pain level controlled Vital Signs Assessment: post-procedure vital signs reviewed and stable Respiratory status: spontaneous breathing, nonlabored ventilation, respiratory function stable and patient connected to nasal cannula oxygen Cardiovascular status: blood pressure returned to baseline and stable Postop Assessment: no apparent nausea or vomiting Anesthetic complications: no   No notable events documented.  Last Vitals:  Vitals:   06/01/22 1215 06/01/22 1230  BP: (!) 155/78 112/65  Pulse: 78 66  Resp: 18 11  Temp:    SpO2: 98% 98%    Last Pain:  Vitals:   06/01/22 1117  TempSrc:   PainSc: 8                  Marckus Hanover A.

## 2022-06-01 NOTE — Discharge Instructions (Signed)
SURGERY: POST OP INSTRUCTIONS (Surgery for small bowel obstruction, colon resection, etc)   ######################################################################  EAT Gradually transition to a high fiber diet with a fiber supplement over the next few days after discharge  WALK Walk an hour a day.  Control your pain to do that.    CONTROL PAIN Control pain so that you can walk, sleep, tolerate sneezing/coughing, go up/down stairs.  HAVE A BOWEL MOVEMENT DAILY Keep your bowels regular to avoid problems.  OK to try a laxative to override constipation.  OK to use an antidairrheal to slow down diarrhea.  Call if not better after 2 tries  CALL IF YOU HAVE PROBLEMS/CONCERNS Call if you are still struggling despite following these instructions. Call if you have concerns not answered by these instructions  ######################################################################   DIET Follow a light diet the first few days at home.  Start with a bland diet such as soups, liquids, starchy foods, low fat foods, etc.  If you feel full, bloated, or constipated, stay on a ful liquid or pureed/blenderized diet for a few days until you feel better and no longer constipated. Be sure to drink plenty of fluids every day to avoid getting dehydrated (feeling dizzy, not urinating, etc.). Gradually add a fiber supplement to your diet over the next week.  Gradually get back to a regular solid diet.  Avoid fast food or heavy meals the first week as you are more likely to get nauseated. It is expected for your digestive tract to need a few months to get back to normal.  It is common for your bowel movements and stools to be irregular.  You will have occasional bloating and cramping that should eventually fade away.  Until you are eating solid food normally, off all pain medications, and back to regular activities; your bowels will not be normal. Focus on eating a low-fat, high fiber diet the rest of your life  (See Getting to Good Bowel Health, below).  CARE of your INCISION or WOUND  It is good for closed incisions and even open wounds to be washed every day.  Shower every day.  Short baths are fine.  Wash the incisions and wounds clean with soap & water.    You may leave closed incisions open to air if it is dry.   You may cover the incision with clean gauze & replace it after your daily shower for comfort.  TEGADERM & WICKS:  You have clear gauze band-aid dressings over your closed incision(s).  Remove the dressings & shoelace ribbon wicks in your largest incision 3 days after surgery.    If you have an open wound with a wound vac, see wound vac care instructions.    ACTIVITIES as tolerated Start light daily activities --- self-care, walking, climbing stairs-- beginning the day after surgery.  Gradually increase activities as tolerated.  Control your pain to be active.  Stop when you are tired.  Ideally, walk several times a day, eventually an hour a day.   Most people are back to most day-to-day activities in a few weeks.  It takes 4-8 weeks to get back to unrestricted, intense activity. If you can walk 30 minutes without difficulty, it is safe to try more intense activity such as jogging, treadmill, bicycling, low-impact aerobics, swimming, etc. Save the most intensive and strenuous activity for last (Usually 4-8 weeks after surgery) such as sit-ups, heavy lifting, contact sports, etc.  Refrain from any intense heavy lifting or straining until you are off narcotics   for pain control.  You will have off days, but things should improve week-by-week. DO NOT PUSH THROUGH PAIN.  Let pain be your guide: If it hurts to do something, don't do it.  Pain is your body warning you to avoid that activity for another week until the pain goes down. You may drive when you are no longer taking narcotic prescription pain medication, you can comfortably wear a seatbelt, and you can safely make sudden turns/stops to  protect yourself without hesitating due to pain. You may have sexual intercourse when it is comfortable. If it hurts to do something, stop.  MEDICATIONS Take your usually prescribed home medications unless otherwise directed.   Blood thinners:  Usually you can restart any strong blood thinners after the second postoperative day.  It is OK to take aspirin right away.     If you are on strong blood thinners (warfarin/Coumadin, Plavix, Xerelto, Eliquis, Pradaxa, etc), discuss with your surgeon, medicine PCP, and/or cardiologist for instructions on when to restart the blood thinner & if blood monitoring is needed (PT/INR blood check, etc).     PAIN CONTROL Pain after surgery or related to activity is often due to strain/injury to muscle, tendon, nerves and/or incisions.  This pain is usually short-term and will improve in a few months.  To help speed the process of healing and to get back to regular activity more quickly, DO THE FOLLOWING THINGS TOGETHER: Increase activity gradually.  DO NOT PUSH THROUGH PAIN Use Ice and/or Heat Try Gentle Massage and/or Stretching Take over the counter pain medication Take Narcotic prescription pain medication for more severe pain  Good pain control = faster recovery.  It is better to take more medicine to be more active than to stay in bed all day to avoid medications.  Increase activity gradually Avoid heavy lifting at first, then increase to lifting as tolerated over the next 6 weeks. Do not "push through" the pain.  Listen to your body and avoid positions and maneuvers than reproduce the pain.  Wait a few days before trying something more intense Walking an hour a day is encouraged to help your body recover faster and more safely.  Start slowly and stop when getting sore.  If you can walk 30 minutes without stopping or pain, you can try more intense activity (running, jogging, aerobics, cycling, swimming, treadmill, sex, sports, weightlifting,  etc.) Remember: If it hurts to do it, then don't do it! Use Ice and/or Heat You will have swelling and bruising around the incisions.  This will take several weeks to resolve. Ice packs or heating pads (6-8 times a day, 30-60 minutes at a time) will help sooth soreness & bruising. Some people prefer to use ice alone, heat alone, or alternate between ice & heat.  Experiment and see what works best for you.  Consider trying ice for the first few days to help decrease swelling and bruising; then, switch to heat to help relax sore spots and speed recovery. Shower every day.  Short baths are fine.  It feels good!  Keep the incisions and wounds clean with soap & water.   Try Gentle Massage and/or Stretching Massage at the area of pain many times a day Stop if you feel pain - do not overdo it Take over the counter pain medication This helps the muscle and nerve tissues become less irritable and calm down faster Choose ONE of the following over-the-counter anti-inflammatory medications: Acetaminophen 500mg tabs (Tylenol) 1-2 pills with every meal and   just before bedtime (avoid if you have liver problems or if you have acetaminophen in you narcotic prescription) Naproxen 220mg tabs (ex. Aleve, Naprosyn) 1-2 pills twice a day (avoid if you have kidney, stomach, IBD, or bleeding problems) Ibuprofen 200mg tabs (ex. Advil, Motrin) 3-4 pills with every meal and just before bedtime (avoid if you have kidney, stomach, IBD, or bleeding problems) Take with food/snack several times a day as directed for at least 2 weeks to help keep pain / soreness down & more manageable. Take Narcotic prescription pain medication for more severe pain A prescription for strong pain control is often given to you upon discharge (for example: oxycodone/Percocet, hydrocodone/Norco/Vicodin, or tramadol/Ultram) Take your pain medication as prescribed. Be mindful that most narcotic prescriptions contain Tylenol (acetaminophen) as well -  avoid taking too much Tylenol. If you are having problems/concerns with the prescription medicine (does not control pain, nausea, vomiting, rash, itching, etc.), please call us [phone] to see if we need to switch you to a different pain medicine that will work better for you and/or control your side effects better. If you need a refill on your pain medication, you must call the office before 4 pm and on weekdays only.  By federal law, prescriptions for narcotics cannot be called into a pharmacy.  They must be filled out on paper & picked up from our office by the patient or authorized caretaker.  Prescriptions cannot be filled after 4 pm nor on weekends.    WHEN TO CALL US [phone] Severe uncontrolled or worsening pain  Fever over 101 F (38.5 C) Concerns with the incision: Worsening pain, redness, rash/hives, swelling, bleeding, or drainage Reactions / problems with new medications (itching, rash, hives, nausea, etc.) Nausea and/or vomiting Difficulty urinating Difficulty breathing Worsening fatigue, dizziness, lightheadedness, blurred vision Other concerns If you are not getting better after two weeks or are noticing you are getting worse, contact our office [phone] for further advice.  We may need to adjust your medications, re-evaluate you in the office, send you to the emergency room, or see what other things we can do to help. The clinic staff is available to answer your questions during regular business hours (8:30am-5pm).  Please don't hesitate to call and ask to speak to one of our nurses for clinical concerns.    A surgeon from [facility] is always on call at the hospitals 24 hours/day If you have a medical emergency, go to the nearest emergency room or call 911.  FOLLOW UP in our office One the day of your discharge from the hospital (or the next business weekday), please call [facility] to set up or confirm an appointment to see your  surgeon in the office for a follow-up appointment.  Usually it is 2-3 weeks after your surgery.   If you have skin staples at your incision(s), let the office know so we can set up a time in the office for the nurse to remove them (usually around 10 days after surgery). Make sure that you call for appointments the day of discharge (or the next business weekday) from the hospital to ensure a convenient appointment time. IF YOU HAVE DISABILITY OR FAMILY LEAVE FORMS, BRING THEM TO THE OFFICE FOR PROCESSING.  DO NOT GIVE THEM TO YOUR DOCTOR.  [facility] [address], [address], [place], [place]  [address] ? [phone] - Main [phone] - Toll Free,  [phone] - Fax [email or web address]      GETTING TO GOOD BOWEL HEALTH. It is expected for your digestive tract to need a few months to get back to normal.  It is common for your bowel movements and stools to be irregular.  You will have occasional bloating and cramping that should eventually fade away.  Until you are eating solid food normally, off all pain medications, and back to regular activities; your bowels will not be normal.   Avoiding constipation The goal: ONE SOFT BOWEL MOVEMENT A DAY!    Drink plenty of fluids.  Choose water first. TAKE A FIBER SUPPLEMENT EVERY DAY THE REST OF YOUR LIFE During your first week back home, gradually add back a fiber supplement every day Experiment which form you can tolerate.   There are many forms such as powders, tablets, wafers, gummies, etc Psyllium bran (Metamucil), methylcellulose (Citrucel), Miralax or Glycolax, Benefiber, Flax Seed.  Adjust the dose week-by-week (1/2 dose/day to 6 doses a day) until you are moving your bowels 1-2 times a day.  Cut back the dose or try a different fiber product if it is giving you problems such as diarrhea or bloating. Sometimes a laxative is needed to help jump-start bowels if constipated until the fiber supplement can help  regulate your bowels.  If you are tolerating eating & you are farting, it is okay to try a gentle laxative such as double dose MiraLax, prune juice, or Milk of Magnesia.  Avoid using laxatives too often. Stool softeners can sometimes help counteract the constipating effects of narcotic pain medicines.  It can also cause diarrhea, so avoid using for too long. If you are still constipated despite taking fiber daily, eating solids, and a few doses of laxatives, call our office. Controlling diarrhea Try drinking liquids and eating bland foods for a few days to avoid stressing your intestines further. Avoid dairy products (especially milk & ice cream) for a short time.  The intestines often can lose the ability to digest lactose when stressed. Avoid foods that cause gassiness or bloating.  Typical foods include beans and other legumes, cabbage, broccoli, and dairy foods.  Avoid greasy, spicy, fast foods.  Every person has some sensitivity to other foods, so listen to your body and avoid those foods that trigger problems for you. Probiotics (such as active yogurt, Align, etc) may help repopulate the intestines and colon with normal bacteria and calm down a sensitive digestive tract Adding a fiber supplement gradually can help thicken stools by absorbing excess fluid and retrain the intestines to act more normally.  Slowly increase the dose over a few weeks.  Too much fiber too soon can backfire and cause cramping & bloating. It is okay to try and slow down diarrhea with a few doses of antidiarrheal medicines.   Bismuth subsalicylate (ex. Kayopectate, Pepto Bismol) for a few doses can help control diarrhea.  Avoid if pregnant.   Loperamide (Imodium) can slow down diarrhea.  Start with one tablet (2mg) first.  Avoid if you are having fevers or severe pain.  ILEOSTOMY PATIENTS WILL HAVE CHRONIC DIARRHEA since their colon is not in use.    Drink plenty of liquids.  You will need to drink even more glasses of  water/liquid a day to avoid getting dehydrated. Record output from your ileostomy.  Expect to empty the bag every 3-4 hours at first.  Most people with a permanent ileostomy empty their bag 4-6 times at the least.   Use antidiarrheal medicine (especially Imodium) several times a day to avoid getting dehydrated.    Start with a dose at bedtime & breakfast.  Adjust up or down as needed.  Increase antidiarrheal medications as directed to avoid emptying the bag more than 8 times a day (every 3 hours). Work with your wound ostomy nurse to learn care for your ostomy.  See ostomy care instructions. TROUBLESHOOTING IRREGULAR BOWELS 1) Start with a soft & bland diet. No spicy, greasy, or fried foods.  2) Avoid gluten/wheat or dairy products from diet to see if symptoms improve. 3) Miralax 17gm or flax seed mixed in 8oz. water or juice-daily. May use 2-4 times a day as needed. 4) Gas-X, Phazyme, etc. as needed for gas & bloating.  5) Prilosec (omeprazole) over-the-counter as needed 6)  Consider probiotics (Align, Activa, etc) to help calm the bowels down  Call your doctor if you are getting worse or not getting better.  Sometimes further testing (cultures, endoscopy, X-ray studies, CT scans, bloodwork, etc.) may be needed to help diagnose and treat the cause of the diarrhea. [facility] [address], [address], [place], [place]  [address] [phone] - Main.    [phone]  - Toll Free.   [phone] - Fax [email or web address]  
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
## 2022-06-01 NOTE — Op Note (Signed)
06/01/2022  11:25 AM  PATIENT:  Brittany Hobbs  61 y.o. female  Patient Care Team: Berkley Harvey, NP as PCP - General (Nurse Practitioner) Dwan Bolt, MD as Consulting Physician (General Surgery) Michael Boston, MD as Consulting Physician (Colon and Rectal Surgery) Ladene Artist, MD as Consulting Physician (Gastroenterology)  PRE-OPERATIVE DIAGNOSIS:  COLOSTOMY FOR COLON RESECTION, DESIRE FOR OSTOMY TAKEDOWN  POST-OPERATIVE DIAGNOSIS:  COLOSTOMY FOR COLON RESECTION, DESIRE FOR OSTOMY TAKEDOWN  PROCEDURE:  XI ROBOTIC COLOSTOMY TAKEDOWN ROBOTIC LYSIS OF ADHESIONS X 60 MIN RIGID PROCTOSCOPY  SURGEON:  Adin Hector, MD  ASSISTANT: Leighton Ruff, MD, FACS, FASCRS [place], PA-S, [facility]   An experienced assistant was required given the standard of surgical care given the complexity of the case.  This assistant was needed for exposure, dissection, suction, tissue approximation, retraction, perception, etc.  ANESTHESIA:   local and general  EBL:  Total I/O In: 2026.7 [I.V.:1426.7; IV Piggyback:600] Out: 150 [Urine:100; Blood:50]  Delay start of Pharmacological VTE agent (>24hrs) due to surgical blood loss or risk of bleeding:  no  DRAINS: none   SPECIMEN:  COLOSTOMY & DISTAL RING  DISPOSITION OF SPECIMEN:  PATHOLOGY  COUNTS:  YES  PLAN OF CARE: Admit to inpatient   PATIENT DISPOSITION:  PACU - hemodynamically stable.  INDICATION: Pleasant patient status post colectomy with end ostomy.  The patient has recovered from that surgery and has understandably requested ostomy takedown.  Medically stabilized and felt reasonable to proceed.   I discussed the procedure with the patient:  The anatomy & physiology of the digestive tract was discussed.  The pathophysiology was discussed.  Possibility of remaining with an ostomy permanently was discussed.  I offered ostomy takedown.  Minimally invasive & open techniques were discussed.   Risks such as  bleeding, infection, abscess, leak, reoperation, possible re-ostomy, injury to other organs, hernia, heart attack, death, and other risks were discussed.   I noted a good likelihood this will help address the problem.  Goals of post-operative recovery were discussed as well.  We will work to minimize complications.  Questions were answered.  The patient expresses understanding & wishes to proceed with surgery.  OR FINDINGS:    Moderately dense intra-abdominal adhesions of omentum and small bowel to anterior abdominal wall and especially to pelvis and left retroperitoneum.  Normal anatomy.  It is a 29 EEA stapled anastomosis from distal descending colon to the anterior proximal rectal wall.  The anastomosis rests 10 cm from the anal verge.   CASE DATA:  Type of patient?: Elective [facility] Private Case  Status of Case? Elective Scheduled  Infection Present At Time Of Surgery (PATOS)?  NO  DESCRIPTION:   Informed consent was confirmed.  The patient underwent general anaesthesia without difficulty.  Patient underwent cystoscopy and ICG firefly injection of ureters by Dr. Alinda Money with [facility] for anatomy identification given her numerous abdominal surgeries.  Please see his separate operative note.  The patient was positioned appropriately.  VTE prevention in place.  The patient's abdomen was clipped, prepped, & draped in a sterile fashion.  Surgical timeout confirmed our plan.  Peritoneal entry with a laparoscopic port was obtained using Varess spring needle entry technique in the left upper abdomen as the patient was positioned in reverse Trendelenburg.  I induced carbon dioxide insufflation.  No change in end tidal CO2 measurements.  Full symmetrical abdominal distention.  Initial port was carefully placed.  Camera inspection revealed no injury.  Extra ports were carefully  placed under direct laparoscopic visualization.  Xi robot carefully docked & instruments placed.  We then continued with  minimally invasive exploration.  I worked to freed adhesions to the anterior abdominal wall parietal peritoneum.  Freed omental adhesions off the colostomy and visceral peritoneum.  Make sure to free all small bowel interloop adhesions and adhesions to the left retroperitoneum and colostomy mesentery and pelvis.  We inspected the small intestine to ensure there is no injury or other surprises.  Hemostasis was good.    We focused on dissection down in the pelvis.  Work to free adhesions and identify the rectal stump.  Freed off visceral peritoneum along the right and left anterior rectal pelvic reflection.  Work to come behind the rectal stump and the presacral plane to help elevate the mesorectum off the sacral promontory to be sure we adequately identified the rectosigmoid junction.  Anatomy was somewhat distorted and the rectum was more on the right side of the pelvis but were able to carefully elevate and protected.  I was able to remove old rectal wax mucus balls and pass blood EEA sizers from the anus proximally.  There was 1 region on the proximal end that was somewhat tightened down.  Went back robotically to help mobilize some of the visceral peritoneum off that.  Could get the site just come up almost all the way to the tip but not exit totally at the tip.  I felt in the interest of avoiding over dissection with her thin tissues in a sterile low-grade dependent former smoker with thin tissues, I felt it would be safe to come out the anterior rectal wall.   Dr. Marcello Moores felt that was agreeable as well with enough mobility we felt we do not need to do any further resection of the remaining rectal stump.  Hemostasis was good.  We did reinspection and saw no injury or other concerns.  Felt it was safe to proceed with colostomy takedown.  We made an incision around the ostomy.  I got into the subcutaneous tissues.  I used careful focused right angle dissection and sharp dissection.  Some focused cautery  dissection as well.  That helped to free adhesions to the subcutaneous tisses & fascia.  I was able to enter into the peritoneum focally.  I did a gentle finger sweep.  Gradually came around circumferentially and freed the bowel from remaining adhesions to the abdominal wall.  We were able eviscerate the ostomy to do inspection and assured viability and hemostasis.  I chose as distal the location that was viable for the proximal end of the anastomosis.  I clamped the colon at the point of resection using a reusable pursestringer device.  Passed a 2-0 Keith needle. I transected at the descending/sigmoid junction with a scalpel. I got healthy bleeding mucosa.  We sent the rectosigmoid colon specimen off to go to pathology.  We sized the colon orifice.   I chose a 33m EEA anvil stapler system.  I reinforced the prolene pursestring with interrupted silk "belt loop" sutures.  I placed the anvil to the open end of the proximal remaining colon and closed around it using the pursestring.   Returned the viscera back into the abdomen and did inspection of the abdomen.  We did copious irrigation with crystalloid solution.  Hemostasis was good.  The distal end of the colon at the handle easily reached down to the rectal stump, therefore, splenic flexure mobilization was not needed.      Dr  Thomas scrubbed down and did gentle anal dilation and advanced the EEA stapler up the rectal stump. The spike was brought out at the provimal end of the rectal stump under direct visualization.  I  attached the anvil of the proximal colon the spike of the stapler. Anvil was tightened down and held clamped for 60 seconds.  Orientation was confirmed such that there is no twisting of the colon nor small bowel underneath the mesenteric defect. No concerning tension.  The EEA stapler was fired and held clamped for 30 seconds. The stapler was released & removed. Blue stitch is in the proximal ring.  Care was taken to ensure no other structures  were incorporated within this either.  We noted 2 excellent anastomotic rings.  The colon proximal to the anastomosis was then gently occluded. The pelvis was filled with sterile irrigation.  Dr Marcello Moores insufflate did not cross the colorectal anastomosis transanally.  There was a negative air leak test.  They did rigid proctoscopy noted the anastomosis was at 10-11 cm from the anal verge consistent with the proximal rectum.  There was no tension of mesentery or bowel at the anastomosis.   Tissues looked viable.  Ureters & bowel uninjured.  The anastomosis looked healthy. Greater omentum positioned down into the pelvis to help protect the anastomosis.    I removed CO2 gas out through the ports.  Ports and will protector and instruments removed.  We changed gown and gloves.  The patient was re-draped.  Sterile unused instruments were used from this point out per colon SSI prevention protocol.  I closed the 49m port sites using Monocryl stitch and sterile dressing.  I closed the fascia of the abdominal wall ostomy wound using #1 PDS transversely.  Patient had thinned out skin around the colostomy so I excised some redundant skin at old hernia sac and close colostomy wound transversely at the dermis with some interrupted Monocryl stitches. I placed antibiotic-soaked wicks into the closure at the corners x2. I placed a sterile dressing.    Patient is being extubated go to recovery room. I discussed postop care with the patient in detail the office & in the holding area. Instructions are written. I discussed operative findings, updated the patient's status, discussed probable steps to recovery, and gave postoperative recommendations to the patient's sister, Elanor Royals .  Recommendations were made.  Questions were answered.  She expressed understanding & appreciation.    SAdin Hector M.D., F.A.C.S. Gastrointestinal and Minimally Invasive Surgery [facility] [address] [place] [phone] / Paging

## 2022-06-02 ENCOUNTER — Encounter (HOSPITAL_COMMUNITY): Payer: Self-pay | Admitting: Surgery

## 2022-06-02 DIAGNOSIS — N182 Chronic kidney disease, stage 2 (mild): Secondary | ICD-10-CM

## 2022-06-02 LAB — BASIC METABOLIC PANEL
Anion gap: 12 (ref 5–15)
BUN: 49 mg/dL — ABNORMAL HIGH (ref 6–20)
CO2: 22 mmol/L (ref 22–32)
Calcium: 8.5 mg/dL — ABNORMAL LOW (ref 8.9–10.3)
Chloride: 105 mmol/L (ref 98–111)
Creatinine, Ser: 3.29 mg/dL — ABNORMAL HIGH (ref 0.44–1.00)
GFR, Estimated: 15 mL/min — ABNORMAL LOW (ref >60)
Glucose, Bld: 121 mg/dL — ABNORMAL HIGH (ref 70–99)
Potassium: 5.7 mmol/L — ABNORMAL HIGH (ref 3.5–5.1)
Sodium: 139 mmol/L (ref 135–145)

## 2022-06-02 LAB — CBC
HCT: 29.1 % — ABNORMAL LOW (ref 36.0–46.0)
Hemoglobin: 9 g/dL — ABNORMAL LOW (ref 12.0–15.0)
MCH: 24.1 pg — ABNORMAL LOW (ref 26.0–34.0)
MCHC: 30.9 g/dL (ref 30.0–36.0)
MCV: 77.8 fL — ABNORMAL LOW (ref 80.0–100.0)
Platelets: 372 10*3/uL (ref 150–400)
RBC: 3.74 MIL/uL — ABNORMAL LOW (ref 3.87–5.11)
RDW: 20.2 % — ABNORMAL HIGH (ref 11.5–15.5)
WBC: 14.6 10*3/uL — ABNORMAL HIGH (ref 4.0–10.5)
nRBC: 0 % (ref 0.0–0.2)

## 2022-06-02 LAB — SURGICAL PATHOLOGY

## 2022-06-02 LAB — MAGNESIUM: Magnesium: 1.9 mg/dL (ref 1.7–2.4)

## 2022-06-02 MED ORDER — SODIUM CHLORIDE 0.9 % IV BOLUS: 1000.0000 mL | Freq: Three times a day (TID) | INTRAVENOUS | Status: AC | PRN

## 2022-06-02 MED ORDER — SODIUM CHLORIDE 0.9 % IV SOLN: INTRAVENOUS | Status: DC

## 2022-06-02 MED ORDER — ENOXAPARIN SODIUM 30 MG/0.3ML IJ SOSY
30.0000 mg | PREFILLED_SYRINGE | INTRAMUSCULAR | Status: DC
  Administered 2022-06-02: 30 mg via SUBCUTANEOUS
  Filled 2022-06-02: qty 0.3

## 2022-06-02 MED ORDER — LACTATED RINGERS IV BOLUS
1000.0000 mL | Freq: Once | INTRAVENOUS | Status: AC
  Administered 2022-06-02: 1000 mL via INTRAVENOUS

## 2022-06-02 NOTE — Progress Notes (Addendum)
Nutrition Education Note  RD consulted for nutrition education regarding IBS, chronic diarrhea. Pt is s/p colostomy takedown.   RD provided "Diarrhea Nutrition Therapy" handout from the Academy of [employer] and Dietetics. Reviewed patient's dietary recall. Reviewed soluble and insoluble fiber foods with patient. Encouraged low fiber diet while having loose stools. Pt to avoid raw fruits and vegetables, seeds, peels, and whole grains. Recommended patient drink plenty of water and fluids. Advised patient to chew foods well and to use chewable multivitamins for better absorption.   Teach back method used.  Expect good compliance.  Body mass index is 22.66 kg/m. Pt meets criteria for normal  based on current BMI.  Current diet order is full liquids, patient is consuming approximately 25% of meals at this time. Labs and medications reviewed. No further nutrition interventions warranted at this time. RD contact information provided. If additional nutrition issues arise, please re-consult RD.  Clayton Bibles, MS, RD, LDN Inpatient Clinical Dietitian Contact information available via Amion

## 2022-06-02 NOTE — Progress Notes (Addendum)
Brittany Hobbs [id number] 23-Aug-1961  CARE TEAM:  PCP: Berkley Harvey, NP  Outpatient Care Team: Patient Care Team: Berkley Harvey, NP as PCP - General (Nurse Practitioner) Dwan Bolt, MD as Consulting Physician (General Surgery) Michael Boston, MD as Consulting Physician (Colon and Rectal Surgery) Ladene Artist, MD as Consulting Physician (Gastroenterology)  Inpatient Treatment Team: Treatment Team: Attending Provider: Michael Boston, MD; Registered Nurse: Charlyne Petrin, RN; Licensed Practical Nurse: Wright, [place] E, LPN; Utilization Review: Nicholes Rough, RN; Pharmacist: Royetta Asal, [facility]; Social Worker: Vassie Moselle, LCSW   Problem List:   Principal Problem:   Diverticulitis of colon with perforation Active Problems:   Lupus erythematosus   Hyperlipidemia   HTN (hypertension)   Anxiety   Attention deficit hyperactivity disorder (ADHD), combined type   Depressive disorder   CKD (chronic kidney disease) stage 2, GFR 60-89 ml/min   1 Day Post-Op  06/01/2022 POST-OPERATIVE DIAGNOSIS:  COLOSTOMY FOR COLON RESECTION, DESIRE FOR OSTOMY TAKEDOWN   PROCEDURE:  XI ROBOTIC COLOSTOMY TAKEDOWN ROBOTIC LYSIS OF ADHESIONS X 60 MIN RIGID PROCTOSCOPY   SURGEON:  Adin Hector, MD  OR FINDINGS:    Moderately dense intra-abdominal adhesions of omentum and small bowel to anterior abdominal wall and especially to pelvis and left retroperitoneum.  Normal anatomy.   It is a 29 EEA stapled anastomosis from distal descending colon to the anterior proximal rectal wall.  The anastomosis rests 10 cm from the anal verge.    ###############################  Postoperative diagnosis: History of diverticulitis status post colostomy diversion   Procedures: Cystoscopy with bilateral intraureteral retrograde injection of indocyanine green dye   Surgeon: Pryor Curia. MD  Assessment  Recovering rather well except for concerns of elevated creatinine.  [facility]  Stay = 1 days)  Plan:  Continue ERAS pathway.  Advance diet gradually per protocol.  Give IV fluid bolus.  Try to [facility] and follow closely.  Follow electrolytes and creatinine closely.  She is nonoliguric and feels great.  If worsening or concerning, medicine consultation.  Hopefully not too likely.  Patient having prior history of AKI, I suspect she has some baseline chronic kidney disease.  Should follow-up at follow closely.  Normally with hypertension but with questionable near syncopal episode low threshold to hold blood pressure medication and follow.  Hemoglobin stable.  Resume baseline steroids for lupus.  -VTE prophylaxis, enoxaparin.  SCDs, etc  -mobilize as tolerated to help recovery.  Check orthostatics and mobilize with physical therapy evaluation to be safe.  History of anxiety depression.  Seems normal thymic at this time.  Follow  History of IBS and diarrhea in the setting of perforated diverticulitis.  See what her new baseline is.  Start with absorbable fiber.   \ Disposition:  Disposition:  The patient is from: Home  Anticipate discharge to:  Home  Anticipated Date of Discharge is:  September 17,2023    Barriers to discharge:  Pending Clinical improvement (more likely than not)  Patient currently is NOT MEDICALLY STABLE for discharge from the hospital from a surgery standpoint.      I reviewed nursing notes, last 24 h vitals and pain scores, last 48 h intake and output, last 24 h labs and trends, and last 24 h imaging results. I have reviewed this patient's available data, including medical history, events of note, test results, etc as part of my evaluation.  A significant portion of that time was spent in counseling.  Care during the described time  interval was provided by me.  This care required moderate level of medical decision making.  06/02/2022    Subjective: (Chief complaint)  Patient feels great.  Noted she felt lightheaded in the  bathroom and nearly passed out.  No true syncopal events noted.  Tolerating liquids without any nausea.  Wants to get up.  Objective:  Vital signs:  Vitals:   06/01/22 2052 06/02/22 0031 06/02/22 0500 06/02/22 0521  BP: (!) 150/76 136/67  (!) 142/68  Pulse: 95 87  98  Resp: '18 18  18  '$ Temp: 98.3 F (36.8 C) (!) 97.5 F (36.4 C)  98.2 F (36.8 C)  TempSrc: Oral Oral  Oral  SpO2: 98% 97%  100%  Weight:   56.2 kg   Height:        Last BM Date : 05/31/22  Intake/Output   Yesterday:  09/14 0701 - 09/15 0700 In: 5455.3 [P.O.:360; I.V.:3695.3; IV Piggyback:1400] Out: 926 [Urine:875; Stool:1; Blood:50] This shift:  No intake/output data recorded.  Bowel function:  Flatus: YES  BM:  YES  Drain: (No drain)   Physical Exam:  General: Pt awake/alert in no acute distress.  Sitting up in bed moving without any hesitancy or guarding.  Smiling. Eyes: PERRL, normal EOM.  Sclera clear.  No icterus Neuro: CN II-XII intact w/o focal sensory/motor deficits. Lymph: No head/neck/groin lymphadenopathy Psych:  No delerium/psychosis/paranoia.  Oriented x 4 HENT: Normocephalic, Mucus membranes moist.  No thrush Neck: Supple, No tracheal deviation.  No obvious thyromegaly Chest: No pain to chest wall compression.  Good respiratory excursion.  No audible wheezing CV:  Pulses intact.  Regular rhythm.  No major extremity edema MS: Normal AROM mjr joints.  No obvious deformity  Abdomen: Soft.  Nondistended.  Nontender.  No evidence of peritonitis.  No incarcerated hernias.  It is clean dry and intact.  Ext:  No deformity.  No mjr edema.  No cyanosis Skin: No petechiae / purpurea.  No major sores.  Warm and dry    Results:   Cultures: No results found for this or any previous visit (from the past 720 hour(s)).  Labs: Results for orders placed or performed during the hospital encounter of 06/01/22 (from the past 48 hour(s))  Hemoglobin A1c     Status: Abnormal   Collection Time:  06/01/22  6:09 AM  Result Value Ref Range   Hgb A1c MFr Bld 5.8 (H) 4.8 - 5.6 %    Comment: (NOTE) Pre diabetes:          5.7%-6.4%  Diabetes:              >6.4%  Glycemic control for   <7.0% adults with diabetes    Mean Plasma Glucose 119.76 mg/dL    Comment: Performed at [facility], [place] [address], [place], [place] [address]  Type and screen [place]     Status: None   Collection Time: 06/01/22  6:09 AM  Result Value Ref Range   ABO/RH(D) O POS    Antibody Screen NEG    Sample Expiration      06/04/2022,2359 Performed at [facility], [place] [address], [place],  [address]   Basic metabolic panel     Status: Abnormal   Collection Time: 06/02/22  4:35 AM  Result Value Ref Range   Sodium 139 135 - 145 mmol/L   Potassium 5.7 (H) 3.5 - 5.1 mmol/L   Chloride 105 98 - 111 mmol/L   CO2 22  22 - 32 mmol/L   Glucose, Bld 121 (H) 70 - 99 mg/dL    Comment: Glucose reference range applies only to samples taken after fasting for at least 8 hours.   BUN 49 (H) 6 - 20 mg/dL   Creatinine, Ser 3.29 (H) 0.44 - 1.00 mg/dL   Calcium 8.5 (L) 8.9 - 10.3 mg/dL   GFR, Estimated 15 (L) >60 mL/min    Comment: (NOTE) Calculated using the CKD-EPI Creatinine Equation (2021)    Anion gap 12 5 - 15    Comment: Performed at [facility], [place] [address], [place], [place] [address]  CBC     Status: Abnormal   Collection Time: 06/02/22  4:35 AM  Result Value Ref Range   WBC 14.6 (H) 4.0 - 10.5 K/uL   RBC 3.74 (L) 3.87 - 5.11 MIL/uL   Hemoglobin 9.0 (L) 12.0 - 15.0 g/dL   HCT 29.1 (L) 36.0 - 46.0 %   MCV 77.8 (L) 80.0 - 100.0 fL   MCH 24.1 (L) 26.0 - 34.0 pg   MCHC 30.9 30.0 - 36.0 g/dL   RDW 20.2 (H) 11.5 - 15.5 %   Platelets 372 150 - 400 K/uL   nRBC 0.0 0.0 - 0.2 %    Comment: Performed at [facility], [place] [address], [place], [place] [address]  Magnesium     Status: None   Collection Time: 06/02/22   4:35 AM  Result Value Ref Range   Magnesium 1.9 1.7 - 2.4 mg/dL    Comment: Performed at [facility], [place] [address], [place], [place] [address]    Imaging / Studies: No results found.  Medications / Allergies: per chart  Antibiotics: Anti-infectives (From admission, onward)    Start     Dose/Rate Route Frequency Ordered Stop   06/01/22 2000  cefoTEtan (CEFOTAN) 2 g in sodium chloride 0.9 % 100 mL IVPB        2 g 200 mL/hr over 30 Minutes Intravenous Every 12 hours 06/01/22 1533 06/01/22 2005   06/01/22 1400  neomycin (MYCIFRADIN) tablet 1,000 mg  Status:  Discontinued       See Hyperspace for full Linked Orders Report.   1,000 mg Oral 3 times per day 06/01/22 0533 06/01/22 0538   06/01/22 1400  metroNIDAZOLE (FLAGYL) tablet 1,000 mg  Status:  Discontinued       See Hyperspace for full Linked Orders Report.   1,000 mg Oral 3 times per day 06/01/22 0533 06/01/22 0538   06/01/22 0600  cefoTEtan (CEFOTAN) 2 g in sodium chloride 0.9 % 100 mL IVPB        2 g 200 mL/hr over 30 Minutes Intravenous On call to O.R. 06/01/22 0533 06/01/22 0830         Note: Portions of this report may have been transcribed using voice recognition software. Every effort was made to ensure accuracy; however, inadvertent computerized transcription errors may be present.   Any transcriptional errors that result from this process are unintentional.    Adin Hector, MD, FACS, MASCRS Esophageal, Gastrointestinal & Colorectal Surgery Robotic and Minimally Invasive Surgery  [facility]. [address] [place], [place] [address] [phone] Fax [phone] Main  CONTACT INFORMATION:  Weekday (9AM-5PM): Call [facility] main office at [phone]  Weeknight (5PM-9AM) or Weekend/Holiday: Check [email or web address] (password " [email or web address]") for General Surgery [facility] coverage  (Please, do not use SecureChat as it is not  reliable  communication to reach operating surgeons for immediate patient care given surgeries/outpatient duties/clinic/cross-coverage/off post-call which would lead to a delay in care.  Epic staff messaging available for outptient concerns, but may not be answered for 48 hours or more).     06/02/2022  8:28 AM

## 2022-06-02 NOTE — Progress Notes (Signed)
Mobility Specialist - Progress Note  Lying : 95% SpO2 Sitting : 95% SpO2 Standing (Immediately) : 71% SPO2   06/02/22 1400  Therapy Vitals  Patient Position (if appropriate) Orthostatic Vitals  Orthostatic Lying   BP- Lying 121/46  Pulse- Lying 94  Orthostatic Sitting  BP- Sitting 130/88  Pulse- Sitting 96  Orthostatic Standing at 0 minutes  BP- Standing at 0 minutes 114/84  Pulse- Standing at 0 minutes 98  Oxygen Therapy  O2 Device Nasal Cannula  O2 Flow Rate (L/min) 2 L/min  Mobility  HOB Elevated/Bed Position Self regulated  Activity Stood at bedside  Level of Assistance Contact guard assist, steadying assist  Activity Response Tolerated fair  $Mobility charge 1 Mobility   Pt was found in bed and agreeable to mobilize. Pt stated feeling fine lying and sitting EOB but as soon as she got up from bed she felt dizzy and kept swaying and did not last longer than 3 minutes standing. She had to hold on to the dynamap for support and as soon as she sat down she became more stable.   Ferd Hibbs Mobility Specialist

## 2022-06-03 LAB — HEMOGLOBIN AND HEMATOCRIT, BLOOD
HCT: 25.4 % — ABNORMAL LOW (ref 36.0–46.0)
Hemoglobin: 8 g/dL — ABNORMAL LOW (ref 12.0–15.0)

## 2022-06-03 LAB — HEMOGLOBIN
Hemoglobin: 6 g/dL — CL (ref 12.0–15.0)
Hemoglobin: 5.9 g/dL — CL (ref 12.0–15.0)

## 2022-06-03 LAB — CREATININE, SERUM
Creatinine, Ser: 1.37 mg/dL — ABNORMAL HIGH (ref 0.44–1.00)
GFR, Estimated: 44 mL/min — ABNORMAL LOW (ref >60)

## 2022-06-03 LAB — POTASSIUM: Potassium: 3.6 mmol/L (ref 3.5–5.1)

## 2022-06-03 LAB — PREPARE RBC (CROSSMATCH)

## 2022-06-03 MED ORDER — SODIUM CHLORIDE 0.9% IV SOLUTION: Freq: Once | INTRAVENOUS | Status: DC

## 2022-06-03 NOTE — Progress Notes (Signed)
Mobility Specialist - Progress Note   06/03/22 1212  Mobility  HOB Elevated/Bed Position Self regulated  Activity Ambulated independently in hallway  Range of Motion/Exercises Active  Level of Assistance Independent  Assistive Device None  Distance Ambulated (ft) 250 ft  Activity Response Tolerated well  Transport method Ambulatory  $Mobility charge 1 Mobility   Pt received in bed and agreeable to mobility. No complaints during ambulation. Pt to bed after session with all needs met.     [facility]

## 2022-06-03 NOTE — Progress Notes (Signed)
    Assessment & Plan: POD#2 - status post colostomy takedown - Dr. Johney Maine  Tolerating diet, passing flatus and BM's  AKI - creatinine improving this AM, 1.37  Acute blood loss anemia - 1 unit PRBC overnight with Hgb 8.0 this AM  Encouraged OOB, ambulation  Will check CBC, BMET in AM 9/17.  Possibly home tomorrow if doing well.        Brittany Gemma, MD [facility] [place] practice Office: [phone]        Chief Complaint: Colostomy takedown  Subjective: Patient in bed, pleasant, no complaints.  Tolerating diet.  Passing flatus and liquid BM's.  Objective: Vital signs in last 24 hours: Temp:  [97.3 F (36.3 C)-98.8 F (37.1 C)] 97.3 F (36.3 C) (09/16 0611) Pulse Rate:  [74-90] 74 (09/16 0611) Resp:  [18-20] 20 (09/16 0611) BP: (112-129)/(47-60) 127/60 (09/16 0611) SpO2:  [84 %-98 %] 98 % (09/16 0611) Weight:  [56.2 kg] 56.2 kg (09/16 0500) Last BM Date : 06/02/22  Intake/Output from previous day: 09/15 0701 - 09/16 0700 In: 2403.5 [P.O.:1240; I.V.:896.8; Blood:266.7] Out: 650 [Urine:650] Intake/Output this shift: No intake/output data recorded.  Physical Exam: HEENT - sclerae clear, mucous membranes moist Neck - soft Abdomen - soft, protuberant; wounds dry and intact; minimal tenderness Ext - no edema, non-tender Neuro - alert & oriented, no focal deficits  Lab Results:  Recent Labs    06/02/22 0435 06/03/22 0105 06/03/22 0238 06/03/22 0716  WBC 14.6*  --   --   --   HGB 9.0*   < > 5.9* 8.0*  HCT 29.1*  --   --  25.4*  PLT 372  --   --   --    < > = values in this interval not displayed.   BMET Recent Labs    06/02/22 0435 06/03/22 0238  NA 139  --   K 5.7* 3.6  CL 105  --   CO2 22  --   GLUCOSE 121*  --   BUN 49*  --   CREATININE 3.29* 1.37*  CALCIUM 8.5*  --    PT/INR No results for input(s): "LABPROT", "INR" in the last 72 hours. Comprehensive Metabolic Panel:    Component Value Date/Time   NA 139 06/02/2022 0435    NA 137 05/26/2022 1138   K 3.6 06/03/2022 0238   K 5.7 (H) 06/02/2022 0435   CL 105 06/02/2022 0435   CL 101 05/26/2022 1138   CO2 22 06/02/2022 0435   CO2 28 05/26/2022 1138   BUN 49 (H) 06/02/2022 0435   BUN 17 05/26/2022 1138   CREATININE 1.37 (H) 06/03/2022 0238   CREATININE 3.29 (H) 06/02/2022 0435   GLUCOSE 121 (H) 06/02/2022 0435   GLUCOSE 77 05/26/2022 1138   CALCIUM 8.5 (L) 06/02/2022 0435   CALCIUM 9.0 05/26/2022 1138   AST 53 (H) 02/05/2022 0940   AST 106 (H) 02/04/2022 0215   ALT 112 (H) 02/05/2022 0940   ALT 167 (H) 02/04/2022 0215   ALKPHOS 92 02/05/2022 0940   ALKPHOS 95 02/04/2022 0215   BILITOT 0.8 02/05/2022 0940   BILITOT 1.0 02/04/2022 0215   PROT 6.9 02/05/2022 0940   PROT 6.9 02/04/2022 0215   ALBUMIN 3.6 02/05/2022 0940   ALBUMIN 3.4 (L) 02/04/2022 0215    Studies/Results: No results found.    Brittany Hobbs 06/03/2022   Patient ID: Brittany Hobbs, female   DOB: March 19, 1961, 61 y.o.   MRN: [id number]

## 2022-06-03 NOTE — Progress Notes (Signed)
Mobility Specialist - Progress Note   06/03/22 1554  Mobility  HOB Elevated/Bed Position Self regulated  Activity Ambulated independently in hallway  Range of Motion/Exercises Active  Level of Assistance Independent  Assistive Device None  Distance Ambulated (ft) 250 ft  Activity Response Tolerated well  Transport method Ambulatory  $Mobility charge 1 Mobility   Pt received in bed and agreeable to mobility. No c/o of SOB during ambulation. Pt to bathroom w/ NT at EOS with all necessities in reach.    [facility]

## 2022-06-03 NOTE — Plan of Care (Signed)
  Problem: Activity: Goal: Ability to tolerate increased activity will improve Outcome: Progressing   Problem: Bowel/Gastric: Goal: Gastrointestinal status for postoperative course will improve Outcome: Progressing   Problem: Nutritional: Goal: Will attain and maintain optimal nutritional status will improve Outcome: Progressing   Problem: Pain Managment: Goal: General experience of comfort will improve Outcome: Progressing   

## 2022-06-04 LAB — BASIC METABOLIC PANEL
Anion gap: 4 — ABNORMAL LOW (ref 5–15)
BUN: 16 mg/dL (ref 6–20)
CO2: 26 mmol/L (ref 22–32)
Calcium: 8.5 mg/dL — ABNORMAL LOW (ref 8.9–10.3)
Chloride: 110 mmol/L (ref 98–111)
Creatinine, Ser: 0.59 mg/dL (ref 0.44–1.00)
GFR, Estimated: >60 mL/min (ref >60)
Glucose, Bld: 89 mg/dL (ref 70–99)
Potassium: 3.2 mmol/L — ABNORMAL LOW (ref 3.5–5.1)
Sodium: 140 mmol/L (ref 135–145)

## 2022-06-04 LAB — CBC
HCT: 22.6 % — ABNORMAL LOW (ref 36.0–46.0)
Hemoglobin: 7.2 g/dL — ABNORMAL LOW (ref 12.0–15.0)
MCH: 25.5 pg — ABNORMAL LOW (ref 26.0–34.0)
MCHC: 31.9 g/dL (ref 30.0–36.0)
MCV: 80.1 fL (ref 80.0–100.0)
Platelets: 199 10*3/uL (ref 150–400)
RBC: 2.82 MIL/uL — ABNORMAL LOW (ref 3.87–5.11)
RDW: 21.2 % — ABNORMAL HIGH (ref 11.5–15.5)
WBC: 7.1 10*3/uL (ref 4.0–10.5)
nRBC: 0 % (ref 0.0–0.2)

## 2022-06-04 NOTE — Progress Notes (Signed)
Assessment & Plan: POD#3 - status post colostomy takedown - Dr. Johney Maine             Tolerating diet, passing flatus and BM's             AKI - resolved, creatinine 0.59             Acute blood loss anemia - 1 unit PRBC Hgb 8.0, down to 7.2 this AM             Encouraged OOB, ambulation   Patient states not ready for discharge.  Too much pain.  Will repeat Hgb in AM 9/18 and anticipate discharge home tomorrow per Dr. Johney Maine.        [name], MD [facility] [place] practice Office: [phone]        Chief Complaint: Ostomy closure  Subjective: Patient in bed, abdominal pain "9 out of 10" while smiling and laughing.  Denies nausea.  Objective: Vital signs in last 24 hours: Temp:  [98.6 F (37 C)] 98.6 F (37 C) (09/17 0431) Pulse Rate:  [70-79] 79 (09/17 0431) Resp:  [14-16] 16 (09/17 0431) BP: (129-147)/(59-90) 145/69 (09/17 0431) SpO2:  [91 %-100 %] 99 % (09/17 0431) Weight:  [57 kg] 57 kg (09/17 0451) Last BM Date : 06/03/22  Intake/Output from previous day: 09/16 0701 - 09/17 0700 In: 3513.1 [P.O.:1300; I.V.:2213.1] Out: -  Intake/Output this shift: No intake/output data recorded.  Physical Exam: HEENT - sclerae clear, mucous membranes moist Neck - soft Abdomen - mild distension, wounds dry and intact, mild tenderness Ext - no edema, non-tender Neuro - alert & oriented, no focal deficits  Lab Results:  Recent Labs    06/02/22 0435 06/03/22 0105 06/03/22 0716 06/04/22 0441  WBC 14.6*  --   --  7.1  HGB 9.0*   < > 8.0* 7.2*  HCT 29.1*  --  25.4* 22.6*  PLT 372  --   --  199   < > = values in this interval not displayed.   BMET Recent Labs    06/02/22 0435 06/03/22 0238 06/04/22 0441  NA 139  --  140  K 5.7* 3.6 3.2*  CL 105  --  110  CO2 22  --  26  GLUCOSE 121*  --  89  BUN 49*  --  16  CREATININE 3.29* 1.37* 0.59  CALCIUM 8.5*  --  8.5*   PT/INR No results for input(s): "LABPROT", "INR" in the last 72  hours. Comprehensive Metabolic Panel:    Component Value Date/Time   NA 140 06/04/2022 0441   NA 139 06/02/2022 0435   K 3.2 (L) 06/04/2022 0441   K 3.6 06/03/2022 0238   CL 110 06/04/2022 0441   CL 105 06/02/2022 0435   CO2 26 06/04/2022 0441   CO2 22 06/02/2022 0435   BUN 16 06/04/2022 0441   BUN 49 (H) 06/02/2022 0435   CREATININE 0.59 06/04/2022 0441   CREATININE 1.37 (H) 06/03/2022 0238   GLUCOSE 89 06/04/2022 0441   GLUCOSE 121 (H) 06/02/2022 0435   CALCIUM 8.5 (L) 06/04/2022 0441   CALCIUM 8.5 (L) 06/02/2022 0435   AST 53 (H) 02/05/2022 0940   AST 106 (H) 02/04/2022 0215   ALT 112 (H) 02/05/2022 0940   ALT 167 (H) 02/04/2022 0215   ALKPHOS 92 02/05/2022 0940   ALKPHOS 95 02/04/2022 0215   BILITOT 0.8 02/05/2022 0940   BILITOT 1.0 02/04/2022 0215   PROT 6.9 02/05/2022 0940  PROT 6.9 02/04/2022 0215   ALBUMIN 3.6 02/05/2022 0940   ALBUMIN 3.4 (L) 02/04/2022 0215    Studies/Results: No results found.    [name] 06/04/2022   Patient ID: Brittany Hobbs, female   DOB: 07/23/1961, 61 y.o.   MRN: [id number]

## 2022-06-05 ENCOUNTER — Other Ambulatory Visit (HOSPITAL_COMMUNITY): Payer: Self-pay

## 2022-06-05 LAB — TYPE AND SCREEN
ABO/RH(D): O POS
Antibody Screen: NEGATIVE
Unit division: 0
Unit division: 0

## 2022-06-05 LAB — BPAM RBC
Blood Product Expiration Date: 202310222359
Blood Product Expiration Date: 202310222359
ISSUE DATE / TIME: 202309160318
Unit Type and Rh: 5100
Unit Type and Rh: 5100

## 2022-06-05 LAB — HEMOGLOBIN AND HEMATOCRIT, BLOOD
HCT: 25.2 % — ABNORMAL LOW (ref 36.0–46.0)
Hemoglobin: 8 g/dL — ABNORMAL LOW (ref 12.0–15.0)

## 2022-06-05 MED ORDER — TRAMADOL HCL 50 MG PO TABS
50.0000 mg | ORAL_TABLET | Freq: Four times a day (QID) | ORAL | 0 refills | Status: AC | PRN
  Filled 2022-06-05: qty 30, 4d supply, fill #0

## 2022-06-05 MED ORDER — GABAPENTIN 300 MG PO CAPS
900.0000 mg | ORAL_CAPSULE | Freq: Three times a day (TID) | ORAL | Status: DC
  Administered 2022-06-05: 900 mg via ORAL
  Filled 2022-06-05: qty 3

## 2022-06-05 MED ORDER — HYDROCODONE-ACETAMINOPHEN 5-325 MG PO TABS: 1.0000 | ORAL_TABLET | ORAL | Status: DC | PRN

## 2022-06-05 NOTE — Discharge Summary (Signed)
Physician Discharge Summary    Patient ID: Nira Visscher MRN: [id number] DOB/AGE: 1961-04-16  61 y.o.  Patient Care Team: Berkley Harvey, NP as PCP - General (Nurse Practitioner) Dwan Bolt, MD as Consulting Physician (General Surgery) Michael Boston, MD as Consulting Physician (Colon and Rectal Surgery) Ladene Artist, MD as Consulting Physician (Gastroenterology)  Admit date: 06/01/2022  Discharge date: 06/05/2022  Hospital Stay = 4 days    Discharge Diagnoses:  Principal Problem:   Diverticulitis of colon with perforation Active Problems:   Lupus erythematosus   Hyperlipidemia   HTN (hypertension)   Anxiety   Attention deficit hyperactivity disorder (ADHD), combined type   Depressive disorder   CKD (chronic kidney disease) stage 2, GFR 60-89 ml/min   Irritable bowel syndrome with diarrhea   4 Days Post-Op  06/01/2022  POST-OPERATIVE DIAGNOSIS:  COLOSTOMY FOR COLON RESECTION, DESIRE FOR OSTOMY TAKEDOWN   PROCEDURE:  XI ROBOTIC COLOSTOMY TAKEDOWN ROBOTIC LYSIS OF ADHESIONS X 60 MIN RIGID PROCTOSCOPY   SURGEON:  Adin Hector, MD   OR FINDINGS:    Moderately dense intra-abdominal adhesions of omentum and small bowel to anterior abdominal wall and especially to pelvis and left retroperitoneum.  Normal anatomy.   It is a 29 EEA stapled anastomosis from distal descending colon to the anterior proximal rectal wall.  The anastomosis rests 10 cm from the anal verge.     ###############################   Postoperative diagnosis: History of diverticulitis status post colostomy diversion   Procedures: Cystoscopy with bilateral intraureteral retrograde injection of indocyanine green dye   Surgeon: Pryor Curia MD  Consults: Physical Therapy, Nutrition, and Case Management / [place] Hospital Course:   The patient underwent the surgery above.  Postoperatively, the patient gradually mobilized and advanced to a solid diet.  Pain and other  symptoms were treated aggressively.  Elevated Cr improved rapidly.  Falling Hgb - transfused x1  By the time of discharge, the patient was walking well the hallways, eating food, having flatus.  Pain was well-controlled on an oral medications.  Based on meeting discharge criteria and continuing to recover, I felt it was safe for the patient to be discharged from the hospital to further recover with close followup. Postoperative recommendations were discussed in detail.  They are written as well.  Discharged Condition: good  Discharge Exam: Blood pressure (!) 161/72, pulse 73, temperature 98.3 F (36.8 C), temperature source Oral, resp. rate 16, height '5\' 2"'$  (1.575 m), weight 57 kg, SpO2 94 %.  General: Pt awake/alert/oriented x4 in No acute distress.  Smiling.  Moving easily w/o splinting/hesitency Eyes: PERRL, normal EOM.  Sclera clear.  No icterus Neuro: CN II-XII intact w/o focal sensory/motor deficits. Lymph: No head/neck/groin lymphadenopathy Psych:  No delerium/psychosis/paranoia.  Chatty pleasant - interrupts often.  No flight of ideas HENT: Normocephalic, Mucus membranes moist.  No thrush Neck: Supple, No tracheal deviation Chest:  No chest wall pain w good excursion CV:  Pulses intact.  Regular rhythm MS: Normal AROM mjr joints.  No obvious deformity Abdomen: Dressings & wicks removed.  Old ecchymosis esp R flank.   Soft.  Nondistended.  Nontender.  No evidence of peritonitis.  No incarcerated hernias. Ext:  SCDs BLE.  No mjr edema.  No cyanosis Skin: No petechiae / purpura   Disposition:    Follow-up Information     Michael Boston, MD Follow up.   Specialties: General Surgery, Colon and Rectal Surgery Why: To follow up after your operation Contact information:  [address] [place] [address] [phone]                 Discharge disposition: 01-Home or Self Care       Discharge Instructions     Call MD for:   Complete by: As directed     FEVER > 101.5 F (Temperatures <101.66F can occasionally happen and are not significant)   Call MD for:  extreme fatigue   Complete by: As directed    Call MD for:  persistant dizziness or light-headedness   Complete by: As directed    Call MD for:  persistant nausea and vomiting   Complete by: As directed    Call MD for:  redness, tenderness, or signs of infection (pain, swelling, redness, odor or green/yellow discharge around incision site)   Complete by: As directed    Call MD for:  severe uncontrolled pain   Complete by: As directed    Diet - low sodium heart healthy   Complete by: As directed    Follow a light diet the first few days at home.    If you feel full, bloated, or constipated, stay on a liquid diet until you feel better and not constipated. Gradually get back to a solid diet.  Avoid fast food or heavy meals the first week as you are more likely to get nauseated. It is expected for your digestive tract to need a few months to get back to normal.   Discharge wound care:   Complete by: As directed    You have an open wound. If you have an open wound with a wound vac, see wound vac care instructions. If the wound is being packed, see wound care instructions.    In general, it is encouraged that you remove your dressing and packing, shower with soap & water, and replace your dressing once a day.   Pack the wound with clean gauze moistened with normal (0.9%) saline or KY gel to keep the wound moist & uninfected.    .   Eventually your body will heal & pull the open wound closed over the next few months.  Raw open wounds will occasionally bleed or secrete yellow drainage until it heals closed.   Pressure on the dressing for 30 minutes will stop most wound bleeding Drain sites will drain a little until the drain is removed.   Driving Restrictions   Complete by: As directed    You may drive when you are no longer taking narcotic prescription pain medication, you can  comfortably wear a seatbelt, and you can safely make sudden turns/stops to protect yourself without hesitating due to pain.   Increase activity slowly   Complete by: As directed    Lifting restrictions   Complete by: As directed    Start light daily activities --- self-care, walking, climbing stairs- beginning the day after surgery.   Gradually increase activities as tolerated.   Control your pain to be active.   Stop when you are tired.   Ideally, walk several times a day, eventually an hour a day.   Most people are back to most day-to-day activities in a few weeks.  It takes 4-8 weeks to get back to unrestricted, intense activity. If you can walk 30 minutes without difficulty, it is safe to try more intense activity such as jogging, treadmill, bicycling, low-impact aerobics, swimming, etc. Save the most intensive and strenuous activity for last (Usually 4-8 weeks after surgery) such as  sit-ups, heavy lifting, contact sports, etc.   Refrain from any intense heavy lifting or straining until you are off narcotics for pain control.  You will have off days, but things should improve week-by-week. DO NOT PUSH THROUGH PAIN.   Let pain be your guide: If it hurts to do something, don't do it.  Pain is your body warning you to avoid that activity for another week until the pain goes down.   May shower / Bathe   Complete by: As directed    May walk up steps   Complete by: As directed    Sexual Activity Restrictions   Complete by: As directed    You may have sexual intercourse when it is comfortable. If it hurts to do something, stop.       Allergies as of 06/05/2022       Reactions   Contrast Media [iodinated Contrast Media] Anaphylaxis   Iodine Anaphylaxis   Pt states she is allergic to INTERNAL IODINE   Betadine [povidone Iodine] Hives   Methocarbamol Nausea Only, Other (See Comments)   Felt nauseated and sick. "Felt like ants were crawling on her skin"   Sulfa Antibiotics Nausea And  Vomiting   Tomato Flavor [flavoring Agent] Hives        Medication List     TAKE these medications    albuterol 108 (90 Base) MCG/ACT inhaler Commonly known as: VENTOLIN HFA TAKE 2 PUFFS BY MOUTH EVERY 4 HOURS AS NEEDED FOR WHEEZE What changed:  how much to take when to take this additional instructions   amLODipine 10 MG tablet Commonly known as: NORVASC Take 1 tablet (10 mg total) by mouth daily.   aspirin EC 81 MG tablet Take 81 mg by mouth every morning. Swallow whole.   BC FAST PAIN RELIEF ARTHRITIS PO Take 1 Dose by mouth as needed (pain).   carboxymethylcellulose 0.5 % Soln Commonly known as: REFRESH PLUS Place 1 drop into both eyes 3 (three) times daily as needed (dry eyes).   escitalopram 20 MG tablet Commonly known as: LEXAPRO Take 1 tabllet by mouth daily.   gabapentin 400 MG capsule Commonly known as: NEURONTIN Take 800 mg by mouth 3 (three) times daily.   gabapentin 100 MG capsule Commonly known as: NEURONTIN Take 1 capsule (100 mg total) by mouth 3 (three) times daily.   LORazepam 0.5 MG tablet Commonly known as: ATIVAN Take 0.5 mg by mouth at bedtime as needed for anxiety.   polyethylene glycol 17 g packet Commonly known as: MIRALAX / GLYCOLAX Take 17 g by mouth daily as needed for mild constipation. What changed: when to take this   predniSONE 10 MG tablet Commonly known as: DELTASONE Take 10 mg by mouth daily with breakfast. For lupus   traMADol 50 MG tablet Commonly known as: ULTRAM Take 1-2 tablets (50-100 mg total) by mouth every 6 (six) hours as needed for moderate pain.               Discharge Care Instructions  (From admission, onward)           Start     Ordered   06/01/22 0000  Discharge wound care:       Comments: You have an open wound. If you have an open wound with a wound vac, see wound vac care instructions. If the wound is being packed, see wound care instructions.    In general, it is encouraged that  you remove your dressing and packing, shower with soap &  water, and replace your dressing once a day.   Pack the wound with clean gauze moistened with normal (0.9%) saline or KY gel to keep the wound moist & uninfected.    .   Eventually your body will heal & pull the open wound closed over the next few months.  Raw open wounds will occasionally bleed or secrete yellow drainage until it heals closed.   Pressure on the dressing for 30 minutes will stop most wound bleeding Drain sites will drain a little until the drain is removed.   06/01/22 0823            Significant Diagnostic Studies:  Results for orders placed or performed during the hospital encounter of 06/01/22 (from the past 72 hour(s))  Hemoglobin     Status: Abnormal   Collection Time: 06/03/22  1:05 AM  Result Value Ref Range   Hemoglobin 6.0 (LL) 12.0 - 15.0 g/dL    Comment: CRITICAL RESULT CALLED TO, READ BACK BY AND VERIFIED WITH: Ivor Costa, RN 06/03/22 0116 MH Performed at [facility], [place] [address], [place], [place] [address]   Creatinine, serum     Status: Abnormal   Collection Time: 06/03/22  2:38 AM  Result Value Ref Range   Creatinine, Ser 1.37 (H) 0.44 - 1.00 mg/dL    Comment: DELTA CHECK NOTED   GFR, Estimated 44 (L) >60 mL/min    Comment: (NOTE) Calculated using the CKD-EPI Creatinine Equation (2021) Performed at [facility], [place] [address] [place], [place] [address]   Hemoglobin     Status: Abnormal   Collection Time: 06/03/22  2:38 AM  Result Value Ref Range   Hemoglobin 5.9 (LL) 12.0 - 15.0 g/dL    Comment: CRITICAL VALUE NOTED.  VALUE IS CONSISTENT WITH PREVIOUSLY REPORTED AND CALLED VALUE. REPEATED TO VERIFY Performed at [facility], [place] [address] [place], [place] [address]   Potassium     Status: None   Collection Time: 06/03/22  2:38 AM  Result Value Ref Range   Potassium 3.6 3.5 - 5.1 mmol/L    Comment: DELTA CHECK  NOTED Performed at [facility], [place] [address] [place], [place] [address]   Prepare RBC (crossmatch)     Status: None   Collection Time: 06/03/22  2:38 AM  Result Value Ref Range   Order Confirmation      ORDER PROCESSED BY BLOOD BANK Performed at [facility], [place] [address], [place], [place] [address]   Hemoglobin and hematocrit, blood     Status: Abnormal   Collection Time: 06/03/22  7:16 AM  Result Value Ref Range   Hemoglobin 8.0 (L) 12.0 - 15.0 g/dL    Comment: REPEATED TO VERIFY POST TRANSFUSION SPECIMEN DELTA CHECK NOTED    HCT 25.4 (L) 36.0 - 46.0 %    Comment: Performed at [facility], [place] [address] [place], [place] [address]  CBC     Status: Abnormal   Collection Time: 06/04/22  4:41 AM  Result Value Ref Range   WBC 7.1 4.0 - 10.5 K/uL   RBC 2.82 (L) 3.87 - 5.11 MIL/uL   Hemoglobin 7.2 (L) 12.0 - 15.0 g/dL   HCT 22.6 (L) 36.0 - 46.0 %   MCV 80.1 80.0 - 100.0 fL   MCH 25.5 (L) 26.0 - 34.0 pg   MCHC 31.9 30.0 - 36.0 g/dL   RDW 21.2 (H) 11.5 - 15.5 %   Platelets 199 150 -  400 K/uL   nRBC 0.0 0.0 - 0.2 %    Comment: Performed at [facility], [place] [address], [place], [place] [address]  Basic metabolic panel     Status: Abnormal   Collection Time: 06/04/22  4:41 AM  Result Value Ref Range   Sodium 140 135 - 145 mmol/L   Potassium 3.2 (L) 3.5 - 5.1 mmol/L   Chloride 110 98 - 111 mmol/L   CO2 26 22 - 32 mmol/L   Glucose, Bld 89 70 - 99 mg/dL    Comment: Glucose reference range applies only to samples taken after fasting for at least 8 hours.   BUN 16 6 - 20 mg/dL   Creatinine, Ser 0.59 0.44 - 1.00 mg/dL   Calcium 8.5 (L) 8.9 - 10.3 mg/dL   GFR, Estimated >60 >60 mL/min    Comment: (NOTE) Calculated using the CKD-EPI Creatinine Equation (2021)    Anion gap 4 (L) 5 - 15    Comment: Performed at [facility], [place] [address], [place], [place] [address]  Hemoglobin  and hematocrit, blood     Status: Abnormal   Collection Time: 06/05/22  4:13 AM  Result Value Ref Range   Hemoglobin 8.0 (L) 12.0 - 15.0 g/dL   HCT 25.2 (L) 36.0 - 46.0 %    Comment: Performed at [facility], [place] [address] [place], [place] [address]    No results found.  Past Medical History:  Diagnosis Date   Acute encephalopathy 01/2022   AKI (acute kidney injury) ([place]) 09/30/2019   Arthritis    Asthma    Colonic diverticular abscess 08/10/2018   Diverticulitis    Hyperlipidemia    Hypertension    Intra-abdominal abscess ([place])    Lupus ([place])    Panic attacks    Pneumonia 09/2020   with covid   PONV (postoperative nausea and vomiting)    Seasonal allergies     Past Surgical History:  Procedure Laterality Date   COLON RESECTION SIGMOID N/A 06/10/2021   Procedure: OPEN SIGMOID COLON RESECTION WITH END COLOSTOMY AND ABDOMINAL WASHOUT;  Surgeon: Dwan Bolt, MD;  Location: [facility];  Service: General;  Laterality: N/A;   [place]  06/10/2021   Procedure: CYSTOSCOPY WITH LEFT STENT PLACEMENT;  Surgeon: Dwan Bolt, MD;  Location: [facility];  Service: General;;   EYE SURGERY     LAPAROTOMY N/A 06/19/2021   Procedure: EXPLORATORY LAPAROTOMY;  Surgeon: Erroll Luna, MD;  Location: [facility];  Service: General;  Laterality: N/A;   LYSIS OF ADHESION N/A 06/01/2022   Procedure: LYSIS OF ADHESION;  Surgeon: Michael Boston, MD;  Location: [facility];  Service: General;  Laterality: N/A;   ORIF CALCANEOUS FRACTURE Right 08/26/2021   Procedure: OPEN REDUCTION INTERNAL FIXATION (ORIF) CALCANEOUS FRACTURE;  Surgeon: Shona Needles, MD;  Location: [place];  Service: Orthopedics;  Laterality: Right;   PATELLA FRACTURE SURGERY Left    PROCTOSCOPY N/A 06/01/2022   Procedure: RIGID PROCTOSCOPY;  Surgeon: Michael Boston, MD;  Location: [facility];  Service: General;  Laterality: N/A;   scar tissue eye  1983   right   TONSILLECTOMY     TOTAL KNEE  ARTHROPLASTY Right 05/20/2021   Procedure: RIGHT TOTAL KNEE ARTHROPLASTY;  Surgeon: Mcarthur Rossetti, MD;  Location: [facility];  Service: Orthopedics;  Laterality: Right;  Needs RNFA   TRIGGER POINT INJECTION Right    Pt. gets injeccions every 8 months   TUBAL LIGATION  1985   WRIST FRACTURE SURGERY Right    XI ROBOTIC ASSISTED COLOSTOMY TAKEDOWN N/A 06/01/2022   Procedure: XI ROBOTIC ASSISTED COLOSTOMY TAKEDOWN;  Surgeon: Michael Boston, MD;  Location[facility];  Service: General;  Laterality: N/A;    Social History   Socioeconomic History   Marital status: Widowed    Spouse name: Not on file   Number of children: Not on file   Years of education: Not on file   Highest education level: Not on file  Occupational History   Not on file  Tobacco Use   Smoking status: Every Day    Packs/day: 0.30    Years: 30.00    Total pack years: 9.00    Types: Cigarettes   Smokeless tobacco: Never   Tobacco comments:    Trying to quit  Vaping Use   Vaping Use: Never used  Substance and Sexual Activity   Alcohol use: No   Drug use: Not Currently    Frequency: 7.0 times per week    Types: Marijuana    Comment: CBD   Sexual activity: Not on file  Other Topics Concern   Not on file  Social History Narrative   Not on file   Social Determinants of Health   Financial Resource Strain: Not on file  Food Insecurity: No Food Insecurity (06/01/2022)   Hunger Vital Sign    Worried About Running Out of Food in the Last Year: Never true    Ran Out of Food in the Last Year: Never true  Transportation Needs: No Transportation Needs (06/01/2022)   PRAPARE - Hydrologist (Medical): No    Lack of Transportation (Non-Medical): No  Physical Activity: Not on file  Stress: Not on file  Social Connections: Not on file  Intimate Partner Violence: Not At Risk (06/01/2022)   Humiliation, Afraid, Rape, and Kick questionnaire    Fear of Current or Ex-Partner: No    Emotionally  Abused: No    Physically Abused: No    Sexually Abused: No    Family History  Problem Relation Age of Onset   Colon cancer Neg Hx    Stomach cancer Neg Hx    Esophageal cancer Neg Hx    Rectal cancer Neg Hx     Current Facility-Administered Medications  Medication Dose Route Frequency Provider Last Rate Last Admin   0.9 %  sodium chloride infusion (Manually program via Guardrails IV Fluids)   Intravenous Once [name], MD       0.9 %  sodium chloride infusion  250 mL Intravenous PRN Skyleigh Windle, Remo Lipps, MD       acetaminophen (TYLENOL) tablet 1,000 mg  1,000 mg Oral Q6H Michael Boston, MD   1,000 mg at 06/05/22 0535   albuterol (PROVENTIL) (2.5 MG/3ML) 0.083% nebulizer solution 2.5 mg  2.5 mg Nebulization I1W PRN Leighton Ruff, MD       alum & mag hydroxide-simeth (MAALOX/MYLANTA) 200-200-20 MG/5ML suspension 30 mL  30 mL Oral Q6H PRN Michael Boston, MD       aspirin EC tablet 81 mg  81 mg Oral q morning Michael Boston, MD   81 mg at 06/04/22 0930   diphenhydrAMINE (BENADRYL) 12.5 MG/5ML elixir 12.5 mg  12.5 mg Oral Q6H PRN Michael Boston, MD       Or   diphenhydrAMINE (BENADRYL) injection 12.5 mg  12.5 mg Intravenous Q6H PRN Michael Boston, MD       enalaprilat (VASOTEC) injection 0.625-1.25 mg  0.625-1.25 mg  Intravenous Q6H PRN Michael Boston, MD       escitalopram (LEXAPRO) tablet 20 mg  20 mg Oral QPM Michael Boston, MD   20 mg at 06/04/22 1755   gabapentin (NEURONTIN) capsule 900 mg  900 mg Oral TID Michael Boston, MD       hydrALAZINE (APRESOLINE) injection 10 mg  10 mg Intravenous Q2H PRN Michael Boston, MD       HYDROmorphone (DILAUDID) injection 0.5-2 mg  0.5-2 mg Intravenous Q4H PRN Michael Boston, MD   1 mg at 06/05/22 0535   lip balm (CARMEX) ointment   Topical BID Michael Boston, MD   Given at 06/04/22 2106   LORazepam (ATIVAN) tablet 0.5 mg  0.5 mg Oral QHS PRN Michael Boston, MD   0.5 mg at 06/04/22 1958   magic mouthwash  15 mL Oral QID PRN Michael Boston, MD       melatonin tablet  3 mg  3 mg Oral QHS PRN Michael Boston, MD       methocarbamol (ROBAXIN) 1,000 mg in dextrose 5 % 100 mL IVPB  1,000 mg Intravenous Q6H PRN Michael Boston, MD       methocarbamol (ROBAXIN) tablet 1,000 mg  1,000 mg Oral Q6H PRN Michael Boston, MD   1,000 mg at 06/05/22 0304   metoprolol tartrate (LOPRESSOR) injection 5 mg  5 mg Intravenous Q6H PRN Michael Boston, MD       ondansetron [facility]) tablet 4 mg  4 mg Oral Q6H PRN Michael Boston, MD   4 mg at 06/01/22 1852   Or   ondansetron (ZOFRAN) injection 4 mg  4 mg Intravenous Q6H PRN Michael Boston, MD       polycarbophil (FIBERCON) tablet 625 mg  625 mg Oral BID Michael Boston, MD   625 mg at 06/04/22 2105   polyvinyl alcohol (LIQUIFILM TEARS) 1.4 % ophthalmic solution 1 drop  1 drop Both Eyes TID PRN Michael Boston, MD       predniSONE (DELTASONE) tablet 10 mg  10 mg Oral Q breakfast Michael Boston, MD   10 mg at 06/04/22 1610   prochlorperazine (COMPAZINE) tablet 10 mg  10 mg Oral Q6H PRN Michael Boston, MD       Or   prochlorperazine (COMPAZINE) injection 5-10 mg  5-10 mg Intravenous Q6H PRN Michael Boston, MD       simethicone (MYLICON) chewable tablet 40 mg  40 mg Oral Q6H PRN Michael Boston, MD   40 mg at 06/03/22 2028   sodium chloride flush (NS) 0.9 % injection 3 mL  3 mL Intravenous Gorden Harms, MD   3 mL at 06/04/22 0933   sodium chloride flush (NS) 0.9 % injection 3 mL  3 mL Intravenous PRN Michael Boston, MD       traMADol Veatrice Bourbon) tablet 50-100 mg  50-100 mg Oral Q6H PRN Michael Boston, MD   100 mg at 06/05/22 0305     Allergies  Allergen Reactions   Contrast Media [Iodinated Contrast Media] Anaphylaxis   Iodine Anaphylaxis    Pt states she is allergic to INTERNAL IODINE   Betadine [Povidone Iodine] Hives   Methocarbamol Nausea Only and Other (See Comments)    Felt nauseated and sick. "Felt like ants were crawling on her skin"   Sulfa Antibiotics Nausea And Vomiting   Tomato Flavor [Flavoring Agent] Hives    Signed:   Adin Hector, MD, FACS, MASCRS Esophageal, Gastrointestinal & Colorectal Surgery Robotic and Minimally Invasive Surgery  [facility]  [place] [address], [place], [place] [address] [phone] Fax [phone] Main  CONTACT INFORMATION:  Weekday (9AM-5PM): Call [facility] main office at [phone]  Weeknight (5PM-9AM) or Weekend/Holiday: Check [email or web address] (password " [email or web address]") for General Surgery [facility] coverage  (Please, do not use SecureChat as it is not reliable communication to reach operating surgeons for immediate patient care given surgeries/outpatient duties/clinic/cross-coverage/off post-call which would lead to a delay in care.  Epic staff messaging available for outptient concerns, but may not be answered for 48 hours or more).     06/05/2022, 8:11 AM

## 2022-06-05 NOTE — Progress Notes (Signed)
Discharge instructions given to patient and all questions were answered.  

## 2022-06-05 NOTE — Progress Notes (Signed)
  Transition of Care (TOC) Screening Note   Patient Details  Name: Brittany Hobbs Date of Birth: 06-Feb-1961   Transition of Care [facility]) CM/SW Contact:    Lennart Pall, LCSW Phone Number: 06/05/2022, 10:01 AM    Transition of Care Department [facility]) has reviewed patient and no TOC needs have been identified at this time. We will continue to monitor patient advancement through interdisciplinary progression rounds. If new patient transition needs arise, please place a TOC consult.

## 2022-06-13 ENCOUNTER — Other Ambulatory Visit (HOSPITAL_COMMUNITY): Payer: Self-pay

## 2022-08-07 ENCOUNTER — Other Ambulatory Visit (HOSPITAL_COMMUNITY): Payer: Self-pay

## 2022-08-19 ENCOUNTER — Emergency Department (HOSPITAL_COMMUNITY): Payer: Commercial Managed Care - HMO

## 2022-08-19 ENCOUNTER — Emergency Department (HOSPITAL_COMMUNITY)
Admission: EM | Admit: 2022-08-19 | Discharge: 2022-08-19 | Disposition: A | Discharge status: Other Acute Inpatient Hospital | Payer: Commercial Managed Care - HMO | Attending: Emergency Medicine | Admitting: Emergency Medicine

## 2022-08-19 DIAGNOSIS — S022XXA Fracture of nasal bones, initial encounter for closed fracture: Secondary | ICD-10-CM

## 2022-08-19 DIAGNOSIS — Y9301 Activity, walking, marching and hiking: Secondary | ICD-10-CM | POA: Insufficient documentation

## 2022-08-19 DIAGNOSIS — S02122A Fracture of orbital roof, left side, initial encounter for closed fracture: Secondary | ICD-10-CM | POA: Diagnosis not present

## 2022-08-19 DIAGNOSIS — S02411A LeFort I fracture, initial encounter for closed fracture: Secondary | ICD-10-CM | POA: Diagnosis not present

## 2022-08-19 DIAGNOSIS — S0990XA Unspecified injury of head, initial encounter: Secondary | ICD-10-CM | POA: Diagnosis present

## 2022-08-19 DIAGNOSIS — S299XXA Unspecified injury of thorax, initial encounter: Secondary | ICD-10-CM | POA: Diagnosis not present

## 2022-08-19 DIAGNOSIS — Y92009 Unspecified place in unspecified non-institutional (private) residence as the place of occurrence of the external cause: Secondary | ICD-10-CM | POA: Diagnosis not present

## 2022-08-19 DIAGNOSIS — W108XXA Fall (on) (from) other stairs and steps, initial encounter: Secondary | ICD-10-CM | POA: Diagnosis not present

## 2022-08-19 DIAGNOSIS — S01511A Laceration without foreign body of lip, initial encounter: Secondary | ICD-10-CM | POA: Insufficient documentation

## 2022-08-19 DIAGNOSIS — S3991XA Unspecified injury of abdomen, initial encounter: Secondary | ICD-10-CM | POA: Diagnosis not present

## 2022-08-19 DIAGNOSIS — Z7982 Long term (current) use of aspirin: Secondary | ICD-10-CM | POA: Diagnosis not present

## 2022-08-19 DIAGNOSIS — M7989 Other specified soft tissue disorders: Secondary | ICD-10-CM | POA: Diagnosis not present

## 2022-08-19 DIAGNOSIS — S06369A Traumatic hemorrhage of cerebrum, unspecified, with loss of consciousness of unspecified duration, initial encounter: Secondary | ICD-10-CM | POA: Diagnosis not present

## 2022-08-19 DIAGNOSIS — S0292XB Unspecified fracture of facial bones, initial encounter for open fracture: Secondary | ICD-10-CM

## 2022-08-19 DIAGNOSIS — S0231XA Fracture of orbital floor, right side, initial encounter for closed fracture: Secondary | ICD-10-CM

## 2022-08-19 DIAGNOSIS — W19XXXA Unspecified fall, initial encounter: Secondary | ICD-10-CM

## 2022-08-19 DIAGNOSIS — S0636AA Traumatic hemorrhage of cerebrum, unspecified, with loss of consciousness status unknown, initial encounter: Secondary | ICD-10-CM

## 2022-08-19 DIAGNOSIS — W109XXA Fall (on) (from) unspecified stairs and steps, initial encounter: Secondary | ICD-10-CM

## 2022-08-19 LAB — URINALYSIS, ROUTINE W REFLEX MICROSCOPIC
Bacteria, UA: NONE SEEN
Bilirubin Urine: NEGATIVE
Glucose, UA: NEGATIVE mg/dL
Hgb urine dipstick: NEGATIVE
Ketones, ur: NEGATIVE mg/dL
Leukocytes,Ua: NEGATIVE
Nitrite: NEGATIVE
Protein, ur: NEGATIVE mg/dL
Specific Gravity, Urine: 1.014 (ref 1.005–1.030)
pH: 7 (ref 5.0–8.0)

## 2022-08-19 LAB — COMPREHENSIVE METABOLIC PANEL
ALT: 9 U/L (ref 0–44)
AST: 16 U/L (ref 15–41)
Albumin: 3.4 g/dL — ABNORMAL LOW (ref 3.5–5.0)
Alkaline Phosphatase: 81 U/L (ref 38–126)
Anion gap: 7 (ref 5–15)
BUN: 17 mg/dL (ref 6–20)
CO2: 26 mmol/L (ref 22–32)
Calcium: 9 mg/dL (ref 8.9–10.3)
Chloride: 106 mmol/L (ref 98–111)
Creatinine, Ser: 0.7 mg/dL (ref 0.44–1.00)
GFR, Estimated: >60 mL/min (ref >60)
Glucose, Bld: 147 mg/dL — ABNORMAL HIGH (ref 70–99)
Potassium: 3.3 mmol/L — ABNORMAL LOW (ref 3.5–5.1)
Sodium: 139 mmol/L (ref 135–145)
Total Bilirubin: 0.6 mg/dL (ref 0.3–1.2)
Total Protein: 6.1 g/dL — ABNORMAL LOW (ref 6.5–8.1)

## 2022-08-19 LAB — CBC WITH DIFFERENTIAL/PLATELET
Abs Immature Granulocytes: 0.05 10*3/uL (ref 0.00–0.07)
Basophils Absolute: 0 10*3/uL (ref 0.0–0.1)
Basophils Relative: 0 %
Eosinophils Absolute: 0.5 10*3/uL (ref 0.0–0.5)
Eosinophils Relative: 4 %
HCT: 36.7 % (ref 36.0–46.0)
Hemoglobin: 11.9 g/dL — ABNORMAL LOW (ref 12.0–15.0)
Immature Granulocytes: 0 %
Lymphocytes Relative: 17 %
Lymphs Abs: 2.2 10*3/uL (ref 0.7–4.0)
MCH: 25.2 pg — ABNORMAL LOW (ref 26.0–34.0)
MCHC: 32.4 g/dL (ref 30.0–36.0)
MCV: 77.6 fL — ABNORMAL LOW (ref 80.0–100.0)
Monocytes Absolute: 0.8 10*3/uL (ref 0.1–1.0)
Monocytes Relative: 6 %
Neutro Abs: 9.7 10*3/uL — ABNORMAL HIGH (ref 1.7–7.7)
Neutrophils Relative %: 73 %
Platelets: 267 10*3/uL (ref 150–400)
RBC: 4.73 MIL/uL (ref 3.87–5.11)
RDW: 19.3 % — ABNORMAL HIGH (ref 11.5–15.5)
WBC: 13.2 10*3/uL — ABNORMAL HIGH (ref 4.0–10.5)
nRBC: 0 % (ref 0.0–0.2)

## 2022-08-19 LAB — ETHANOL: Alcohol, Ethyl (B): <10 mg/dL (ref <10)

## 2022-08-19 LAB — PROTIME-INR
INR: 1 (ref 0.8–1.2)
Prothrombin Time: 13.5 seconds (ref 11.4–15.2)

## 2022-08-19 LAB — TYPE AND SCREEN
ABO/RH(D): O POS
Antibody Screen: NEGATIVE

## 2022-08-19 MED ORDER — LABETALOL HCL 5 MG/ML IV SOLN
20.0000 mg | Freq: Once | INTRAVENOUS | Status: AC
  Administered 2022-08-19: 20 mg via INTRAVENOUS
  Filled 2022-08-19: qty 4

## 2022-08-19 MED ORDER — CLEVIDIPINE BUTYRATE 0.5 MG/ML IV EMUL
0.0000 mg/h | INTRAVENOUS | Status: DC
  Administered 2022-08-19: 2 mg/h via INTRAVENOUS
  Filled 2022-08-19: qty 50

## 2022-08-19 MED ORDER — FENTANYL CITRATE PF 50 MCG/ML IJ SOSY
25.0000 ug | PREFILLED_SYRINGE | Freq: Once | INTRAMUSCULAR | Status: AC
  Administered 2022-08-19: 25 ug via INTRAVENOUS
  Filled 2022-08-19: qty 1

## 2022-08-19 MED ORDER — FENTANYL CITRATE PF 50 MCG/ML IJ SOSY
50.0000 ug | PREFILLED_SYRINGE | Freq: Once | INTRAMUSCULAR | Status: AC
  Administered 2022-08-19: 50 ug via INTRAVENOUS
  Filled 2022-08-19: qty 1

## 2022-08-19 MED ORDER — LABETALOL HCL 5 MG/ML IV SOLN
10.0000 mg | Freq: Once | INTRAVENOUS | Status: AC
  Administered 2022-08-19: 10 mg via INTRAVENOUS
  Filled 2022-08-19: qty 4

## 2022-08-19 NOTE — ED Notes (Signed)
Purewick applied.

## 2022-08-19 NOTE — Consult Note (Signed)
Reason for Consult: Facial trauma Referring Physician: Charlotte Crumb is an 61 y.o. female.  HPI: This patient was brought in after falling down a flight of stairs and suffering multiple facial fractures and intracranial hemorrhage.  I was asked to come and evaluate the patient.  Patient is cooperative and communicative.  She speaks with slurred voice but admits to having had quite a bit to drink last night.  Past Medical History:  Diagnosis Date   Acute encephalopathy 01/2022   AKI (acute kidney injury) ([place]) 09/30/2019   Arthritis    Asthma    Colonic diverticular abscess 08/10/2018   Diverticulitis    Hyperlipidemia    Hypertension    Intra-abdominal abscess ([place])    Lupus ([place])    Panic attacks    Pneumonia 09/2020   with covid   PONV (postoperative nausea and vomiting)    Seasonal allergies     Past Surgical History:  Procedure Laterality Date   COLON RESECTION SIGMOID N/A 06/10/2021   Procedure: OPEN SIGMOID COLON RESECTION WITH END COLOSTOMY AND ABDOMINAL WASHOUT;  Surgeon: Dwan Bolt, MD;  Location: [facility];  Service: General;  Laterality: N/A;   [place]  06/10/2021   Procedure: CYSTOSCOPY WITH LEFT STENT PLACEMENT;  Surgeon: Dwan Bolt, MD;  Location: [facility];  Service: General;;   EYE SURGERY     LAPAROTOMY N/A 06/19/2021   Procedure: EXPLORATORY LAPAROTOMY;  Surgeon: Erroll Luna, MD;  Location: [facility];  Service: General;  Laterality: N/A;   LYSIS OF ADHESION N/A 06/01/2022   Procedure: LYSIS OF ADHESION;  Surgeon: Michael Boston, MD;  [facility];  Service: General;  Laterality: N/A;   ORIF CALCANEOUS FRACTURE Right 08/26/2021   Procedure: OPEN REDUCTION INTERNAL FIXATION (ORIF) CALCANEOUS FRACTURE;  Surgeon: Shona Needles, MD;  Location: [facility];  Service: Orthopedics;  Laterality: Right;   PATELLA FRACTURE SURGERY Left    PROCTOSCOPY N/A 06/01/2022   Procedure: RIGID PROCTOSCOPY;  Surgeon: Michael Boston, MD;   [facility];  Service: General;  Laterality: N/A;   scar tissue eye  1983   right   TONSILLECTOMY     TOTAL KNEE ARTHROPLASTY Right 05/20/2021   Procedure: RIGHT TOTAL KNEE ARTHROPLASTY;  Surgeon: Mcarthur Rossetti, MD;  [facility];  Service: Orthopedics;  Laterality: Right;  Needs RNFA   TRIGGER POINT INJECTION Right    Pt. gets injeccions every 8 months   TUBAL LIGATION  1985   WRIST FRACTURE SURGERY Right    XI ROBOTIC ASSISTED COLOSTOMY TAKEDOWN N/A 06/01/2022   Procedure: XI ROBOTIC ASSISTED COLOSTOMY TAKEDOWN;  Surgeon: Michael Boston, MD;  [facility];  Service: General;  Laterality: N/A;    Family History  Problem Relation Age of Onset   Colon cancer Neg Hx    Stomach cancer Neg Hx    Esophageal cancer Neg Hx    Rectal cancer Neg Hx     Social History:  reports that she has been smoking cigarettes. She has a 9.00 pack-year smoking history. She has never used smokeless tobacco. She reports that she does not currently use drugs after having used the following drugs: Marijuana. Frequency: 7.00 times per week. She reports that she does not drink alcohol.  Allergies:  Allergies  Allergen Reactions   Contrast Media [Iodinated Contrast Media] Anaphylaxis   Iodine Anaphylaxis    Pt states she is allergic to INTERNAL IODINE   Betadine [Povidone Iodine] Hives   Methocarbamol Nausea Only and  Other (See Comments)    Felt nauseated and sick. "Felt like ants were crawling on her skin"   Sulfa Antibiotics Nausea And Vomiting   Tomato Flavor [Flavoring Agent] Hives    Medications: I have reviewed the patient's current medications.  Results for orders placed or performed during the hospital encounter of 08/19/22 (from the past 48 hour(s))  Comprehensive metabolic panel     Status: Abnormal   Collection Time: 08/19/22  3:00 AM  Result Value Ref Range   Sodium 139 135 - 145 mmol/L   Potassium 3.3 (L) 3.5 - 5.1 mmol/L   Chloride 106 98 - 111 mmol/L   CO2 26 22 -  32 mmol/L   Glucose, Bld 147 (H) 70 - 99 mg/dL    Comment: Glucose reference range applies only to samples taken after fasting for at least 8 hours.   BUN 17 6 - 20 mg/dL   Creatinine, Ser 0.70 0.44 - 1.00 mg/dL   Calcium 9.0 8.9 - 10.3 mg/dL   Total Protein 6.1 (L) 6.5 - 8.1 g/dL   Albumin 3.4 (L) 3.5 - 5.0 g/dL   AST 16 15 - 41 U/L   ALT 9 0 - 44 U/L   Alkaline Phosphatase 81 38 - 126 U/L   Total Bilirubin 0.6 0.3 - 1.2 mg/dL   GFR, Estimated >60 >60 mL/min    Comment: (NOTE) Calculated using the CKD-EPI Creatinine Equation (2021)    Anion gap 7 5 - 15    Comment: Performed at [facility], [place] [address] [place], [place] [address]  CBC with Differential     Status: Abnormal   Collection Time: 08/19/22  3:00 AM  Result Value Ref Range   WBC 13.2 (H) 4.0 - 10.5 K/uL   RBC 4.73 3.87 - 5.11 MIL/uL   Hemoglobin 11.9 (L) 12.0 - 15.0 g/dL   HCT 36.7 36.0 - 46.0 %   MCV 77.6 (L) 80.0 - 100.0 fL   MCH 25.2 (L) 26.0 - 34.0 pg   MCHC 32.4 30.0 - 36.0 g/dL   RDW 19.3 (H) 11.5 - 15.5 %   Platelets 267 150 - 400 K/uL   nRBC 0.0 0.0 - 0.2 %   Neutrophils Relative % 73 %   Neutro Abs 9.7 (H) 1.7 - 7.7 K/uL   Lymphocytes Relative 17 %   Lymphs Abs 2.2 0.7 - 4.0 K/uL   Monocytes Relative 6 %   Monocytes Absolute 0.8 0.1 - 1.0 K/uL   Eosinophils Relative 4 %   Eosinophils Absolute 0.5 0.0 - 0.5 K/uL   Basophils Relative 0 %   Basophils Absolute 0.0 0.0 - 0.1 K/uL   Immature Granulocytes 0 %   Abs Immature Granulocytes 0.05 0.00 - 0.07 K/uL    Comment: Performed at [facility], [place] [address] [place], [place] [address]  Ethanol     Status: None   Collection Time: 08/19/22  3:00 AM  Result Value Ref Range   Alcohol, Ethyl (B) <10 <10 mg/dL    Comment: (NOTE) Lowest detectable limit for serum alcohol is 10 mg/dL.  For medical purposes only. Performed at [facility], [place] [address] [place], [place] [address]   Type and screen [place]      Status: None   Collection Time: 08/19/22  3:00 AM  Result Value Ref Range   ABO/RH(D) O POS    Antibody Screen NEG    Sample Expiration      08/22/2022,2359  Performed at [facility], [place] [address], [place], [place] [address]   Protime-INR     Status: None   Collection Time: 08/19/22  3:16 AM  Result Value Ref Range   Prothrombin Time 13.5 11.4 - 15.2 seconds   INR 1.0 0.8 - 1.2    Comment: (NOTE) INR goal varies based on device and disease states. Performed at [facility], [place] [address] [place], [place] [address]   Urinalysis, Routine w reflex microscopic Urine, Clean Catch     Status: None   Collection Time: 08/19/22  4:01 AM  Result Value Ref Range   Color, Urine YELLOW YELLOW   APPearance CLEAR CLEAR   Specific Gravity, Urine 1.014 1.005 - 1.030   pH 7.0 5.0 - 8.0   Glucose, UA NEGATIVE NEGATIVE mg/dL   Hgb urine dipstick NEGATIVE NEGATIVE   Bilirubin Urine NEGATIVE NEGATIVE   Ketones, ur NEGATIVE NEGATIVE mg/dL   Protein, ur NEGATIVE NEGATIVE mg/dL   Nitrite NEGATIVE NEGATIVE   Leukocytes,Ua NEGATIVE NEGATIVE   RBC / HPF 0-5 0 - 5 RBC/hpf   WBC, UA 0-5 0 - 5 WBC/hpf   Bacteria, UA NONE SEEN NONE SEEN   Squamous Epithelial / LPF 0-5 0 - 5    Comment: Performed at [facility], [place] [address] [place], [place] [address]    CT Head Wo Contrast  Result Date: 08/19/2022 CLINICAL DATA:  Fall EXAM: CT HEAD WITHOUT CONTRAST CT MAXILLOFACIAL WITHOUT CONTRAST CT CERVICAL SPINE WITHOUT CONTRAST TECHNIQUE: Multidetector CT imaging of the head, cervical spine, and maxillofacial structures were performed using the standard protocol without intravenous contrast. Multiplanar CT image reconstructions of the cervical spine and maxillofacial structures were also generated. RADIATION DOSE REDUCTION: This exam was performed according to the departmental dose-optimization program which includes automated exposure control, adjustment of the mA and/or kV according to  patient size and/or use of iterative reconstruction technique. COMPARISON:  None Available. FINDINGS: CT HEAD FINDINGS Brain: There is multifocal acute intracranial hemorrhage. There is a large intraparenchymal hematoma in the anterior right temporal lobe measuring 3.3 x 1.7 cm. There is subarachnoid blood in the right sylvian fissure and mixed subdural/subarachnoid blood at the left posterior falx. Small amount of subarachnoid blood at the right frontal pole. No midline shift or other mass effect. Mild edema surrounding the right temporal hemorrhage. Vascular: No abnormal hyperdensity of the major intracranial arteries or dural venous sinuses. No intracranial atherosclerosis. Skull: Fracture of the left orbital roof with trace pneumocephalus. There is a fracture of the right frontal bone traversing the anterior and posterior tables of the right frontal sinus. CT MAXILLOFACIAL FINDINGS Osseous: There is a left LeFort type 1 transfacial complex fracture involving the left pterygoid and the anterior, posterior and medial walls of the left maxillary sinus. There are right LeFort type 1 and 2 transfacial complex fractures, involving the pterygoid, all walls of the right maxillary sinus in the inferior and medial walls of the right orbit. There is a nasoorbitoethmoid complex fracture. complex fracture involving both nasal bones, the frontal process of the left maxilla, the right nasolacrimal duct and the right medial orbital wall. There is no mandibular fracture. Orbits: On the right, there are fractures of the orbital floor and lamina papyracea. There is a small superior extraconal hematoma. Small hematoma inferior to the inferior rectus muscle. Fracture traverses the inferior orbital foramen. No extraocular muscle herniation. On the left, there is a fracture of the superior orbital rim with a small  superior extraconal hematoma. Sinuses: Widespread hemosinus. Soft tissues: Severe left facial swelling with subcutaneous  emphysema due to left maxillary sinus fractures. CT CERVICAL SPINE FINDINGS Alignment: No static subluxation. Facets are aligned. Occipital condyles and the lateral masses of C1-C2 are aligned. Skull base and vertebrae: There is a minimally displaced fracture at the superior articular process of the left C3 facet (series 7, image 39). No other cervical spine fracture. Soft tissues and spinal canal: No prevertebral fluid or swelling. No visible canal hematoma. Disc levels: No advanced spinal canal or neural foraminal stenosis. Upper chest: No pneumothorax, pulmonary nodule or pleural effusion. Other: Normal visualized paraspinal cervical soft tissues. IMPRESSION: 1. Multifocal acute intracranial hemorrhage with large intraparenchymal hematoma in the anterior right temporal lobe measuring 3.3 x 1.7 cm. 2. Subarachnoid blood in the right Sylvian fissure and mixed subdural/subarachnoid blood at the left posterior falx. 3. Right frontal bone fracture traversing the right frontal sinus. 4. Bilateral LeFort transfacial complex fractures: right type 1 and 2, left type 1. 5. Nasoorbitoethmoid complex fracture. 6. Multiple orbital fractures with small extraconal hematomas. Left orbital roof fracture with trace adjacent pneumocephalus. 7. Minimally displaced chip fracture of the left C3 facet superior articular process (AO Spine type F1). Critical Value/emergent results were called by telephone at the time of interpretation on 08/19/2022 at 3:55 am to provider [facility] , who verbally acknowledged these results. Electronically Signed   By: Ulyses Jarred M.D.   On: 08/19/2022 04:19   CT Cervical Spine Wo Contrast  Result Date: 08/19/2022 CLINICAL DATA:  Fall EXAM: CT HEAD WITHOUT CONTRAST CT MAXILLOFACIAL WITHOUT CONTRAST CT CERVICAL SPINE WITHOUT CONTRAST TECHNIQUE: Multidetector CT imaging of the head, cervical spine, and maxillofacial structures were performed using the standard protocol without intravenous contrast.  Multiplanar CT image reconstructions of the cervical spine and maxillofacial structures were also generated. RADIATION DOSE REDUCTION: This exam was performed according to the departmental dose-optimization program which includes automated exposure control, adjustment of the mA and/or kV according to patient size and/or use of iterative reconstruction technique. COMPARISON:  None Available. FINDINGS: CT HEAD FINDINGS Brain: There is multifocal acute intracranial hemorrhage. There is a large intraparenchymal hematoma in the anterior right temporal lobe measuring 3.3 x 1.7 cm. There is subarachnoid blood in the right sylvian fissure and mixed subdural/subarachnoid blood at the left posterior falx. Small amount of subarachnoid blood at the right frontal pole. No midline shift or other mass effect. Mild edema surrounding the right temporal hemorrhage. Vascular: No abnormal hyperdensity of the major intracranial arteries or dural venous sinuses. No intracranial atherosclerosis. Skull: Fracture of the left orbital roof with trace pneumocephalus. There is a fracture of the right frontal bone traversing the anterior and posterior tables of the right frontal sinus. CT MAXILLOFACIAL FINDINGS Osseous: There is a left LeFort type 1 transfacial complex fracture involving the left pterygoid and the anterior, posterior and medial walls of the left maxillary sinus. There are right LeFort type 1 and 2 transfacial complex fractures, involving the pterygoid, all walls of the right maxillary sinus in the inferior and medial walls of the right orbit. There is a nasoorbitoethmoid complex fracture. complex fracture involving both nasal bones, the frontal process of the left maxilla, the right nasolacrimal duct and the right medial orbital wall. There is no mandibular fracture. Orbits: On the right, there are fractures of the orbital floor and lamina papyracea. There is a small superior extraconal hematoma. Small hematoma inferior to the  inferior rectus muscle. Fracture traverses the inferior  orbital foramen. No extraocular muscle herniation. On the left, there is a fracture of the superior orbital rim with a small superior extraconal hematoma. Sinuses: Widespread hemosinus. Soft tissues: Severe left facial swelling with subcutaneous emphysema due to left maxillary sinus fractures. CT CERVICAL SPINE FINDINGS Alignment: No static subluxation. Facets are aligned. Occipital condyles and the lateral masses of C1-C2 are aligned. Skull base and vertebrae: There is a minimally displaced fracture at the superior articular process of the left C3 facet (series 7, image 39). No other cervical spine fracture. Soft tissues and spinal canal: No prevertebral fluid or swelling. No visible canal hematoma. Disc levels: No advanced spinal canal or neural foraminal stenosis. Upper chest: No pneumothorax, pulmonary nodule or pleural effusion. Other: Normal visualized paraspinal cervical soft tissues. IMPRESSION: 1. Multifocal acute intracranial hemorrhage with large intraparenchymal hematoma in the anterior right temporal lobe measuring 3.3 x 1.7 cm. 2. Subarachnoid blood in the right Sylvian fissure and mixed subdural/subarachnoid blood at the left posterior falx. 3. Right frontal bone fracture traversing the right frontal sinus. 4. Bilateral LeFort transfacial complex fractures: right type 1 and 2, left type 1. 5. Nasoorbitoethmoid complex fracture. 6. Multiple orbital fractures with small extraconal hematomas. Left orbital roof fracture with trace adjacent pneumocephalus. 7. Minimally displaced chip fracture of the left C3 facet superior articular process (AO Spine type F1). Critical Value/emergent results were called by telephone at the time of interpretation on 08/19/2022 at 3:55 am to provider [facility] , who verbally acknowledged these results. Electronically Signed   By: Ulyses Jarred M.D.   On: 08/19/2022 04:19   CT Maxillofacial WO CM  Result Date:  08/19/2022 CLINICAL DATA:  Fall EXAM: CT HEAD WITHOUT CONTRAST CT MAXILLOFACIAL WITHOUT CONTRAST CT CERVICAL SPINE WITHOUT CONTRAST TECHNIQUE: Multidetector CT imaging of the head, cervical spine, and maxillofacial structures were performed using the standard protocol without intravenous contrast. Multiplanar CT image reconstructions of the cervical spine and maxillofacial structures were also generated. RADIATION DOSE REDUCTION: This exam was performed according to the departmental dose-optimization program which includes automated exposure control, adjustment of the mA and/or kV according to patient size and/or use of iterative reconstruction technique. COMPARISON:  None Available. FINDINGS: CT HEAD FINDINGS Brain: There is multifocal acute intracranial hemorrhage. There is a large intraparenchymal hematoma in the anterior right temporal lobe measuring 3.3 x 1.7 cm. There is subarachnoid blood in the right sylvian fissure and mixed subdural/subarachnoid blood at the left posterior falx. Small amount of subarachnoid blood at the right frontal pole. No midline shift or other mass effect. Mild edema surrounding the right temporal hemorrhage. Vascular: No abnormal hyperdensity of the major intracranial arteries or dural venous sinuses. No intracranial atherosclerosis. Skull: Fracture of the left orbital roof with trace pneumocephalus. There is a fracture of the right frontal bone traversing the anterior and posterior tables of the right frontal sinus. CT MAXILLOFACIAL FINDINGS Osseous: There is a left LeFort type 1 transfacial complex fracture involving the left pterygoid and the anterior, posterior and medial walls of the left maxillary sinus. There are right LeFort type 1 and 2 transfacial complex fractures, involving the pterygoid, all walls of the right maxillary sinus in the inferior and medial walls of the right orbit. There is a nasoorbitoethmoid complex fracture. complex fracture involving both nasal bones,  the frontal process of the left maxilla, the right nasolacrimal duct and the right medial orbital wall. There is no mandibular fracture. Orbits: On the right, there are fractures of the orbital floor and lamina papyracea.  There is a small superior extraconal hematoma. Small hematoma inferior to the inferior rectus muscle. Fracture traverses the inferior orbital foramen. No extraocular muscle herniation. On the left, there is a fracture of the superior orbital rim with a small superior extraconal hematoma. Sinuses: Widespread hemosinus. Soft tissues: Severe left facial swelling with subcutaneous emphysema due to left maxillary sinus fractures. CT CERVICAL SPINE FINDINGS Alignment: No static subluxation. Facets are aligned. Occipital condyles and the lateral masses of C1-C2 are aligned. Skull base and vertebrae: There is a minimally displaced fracture at the superior articular process of the left C3 facet (series 7, image 39). No other cervical spine fracture. Soft tissues and spinal canal: No prevertebral fluid or swelling. No visible canal hematoma. Disc levels: No advanced spinal canal or neural foraminal stenosis. Upper chest: No pneumothorax, pulmonary nodule or pleural effusion. Other: Normal visualized paraspinal cervical soft tissues. IMPRESSION: 1. Multifocal acute intracranial hemorrhage with large intraparenchymal hematoma in the anterior right temporal lobe measuring 3.3 x 1.7 cm. 2. Subarachnoid blood in the right Sylvian fissure and mixed subdural/subarachnoid blood at the left posterior falx. 3. Right frontal bone fracture traversing the right frontal sinus. 4. Bilateral LeFort transfacial complex fractures: right type 1 and 2, left type 1. 5. Nasoorbitoethmoid complex fracture. 6. Multiple orbital fractures with small extraconal hematomas. Left orbital roof fracture with trace adjacent pneumocephalus. 7. Minimally displaced chip fracture of the left C3 facet superior articular process (AO Spine type  F1). Critical Value/emergent results were called by telephone at the time of interpretation on 08/19/2022 at 3:55 am to provider [facility] , who verbally acknowledged these results. Electronically Signed   By: Ulyses Jarred M.D.   On: 08/19/2022 04:19   CT CHEST ABDOMEN PELVIS WO CONTRAST  Result Date: 08/19/2022 CLINICAL DATA:  Polytrauma, blunt.  Fall EXAM: CT CHEST, ABDOMEN AND PELVIS WITHOUT CONTRAST TECHNIQUE: Multidetector CT imaging of the chest, abdomen and pelvis was performed following the standard protocol without IV contrast. RADIATION DOSE REDUCTION: This exam was performed according to the departmental dose-optimization program which includes automated exposure control, adjustment of the mA and/or kV according to patient size and/or use of iterative reconstruction technique. COMPARISON:  08/25/2021 FINDINGS: CT CHEST FINDINGS Cardiovascular: Heart is normal size. Aorta is normal caliber. Scattered coronary artery and aortic calcifications. Mediastinum/Nodes: No mediastinal, hilar, or axillary adenopathy. Trachea and esophagus are unremarkable. Thyroid unremarkable. Lungs/Pleura: Peripheral interstitial thickening/honeycombing in the lungs compatible with fibrosis. No confluent airspace opacities or effusions. No pneumothorax. Musculoskeletal: Chest wall soft tissues are unremarkable. No acute bony abnormality. Compression fractures involving the T6, T7 and T12 vertebral bodies are stable since prior study. CT ABDOMEN PELVIS FINDINGS Hepatobiliary: Gallbladder filled with gallstones. No focal hepatic abnormality or evidence of a hepatic injury. Pancreas: No focal abnormality or ductal dilatation. Spleen: No focal abnormality. Normal size. No evidence of splenic injury. Adrenals/Urinary Tract: No adrenal abnormality. No focal renal abnormality. No stones or hydronephrosis. Urinary bladder is unremarkable. Stomach/Bowel: Stomach, large and small bowel grossly unremarkable. Vascular/Lymphatic:  Aortic atherosclerosis. No evidence of aneurysm or adenopathy. Reproductive: Uterus and adnexa unremarkable.  No mass. Other: No free fluid or free air. Musculoskeletal: No acute bony abnormality. Compression fractures involving the superior endplates of L2 through L4 again noted, unchanged. IMPRESSION: No acute findings or evidence of significant traumatic injury in the chest, abdomen or pelvis. Fibrosis peripherally within the lungs. Aortic atherosclerosis. Cholelithiasis. Electronically Signed   By: Rolm Baptise M.D.   On: 08/19/2022 03:48   DG Pelvis Portable  Result Date: 08/19/2022 CLINICAL DATA:  Status post fall. EXAM: PORTABLE PELVIS 1-2 VIEWS COMPARISON:  August 25, 2021 FINDINGS: There is no evidence of pelvic fracture or diastasis. No pelvic bone lesions are seen. IMPRESSION: Negative. Electronically Signed   By: Virgina Norfolk M.D.   On: 08/19/2022 03:27   DG Chest Port 1 View  Result Date: 08/19/2022 CLINICAL DATA:  Status post fall. EXAM: PORTABLE CHEST 1 VIEW COMPARISON:  Feb 03, 2022 FINDINGS: The heart size and mediastinal contours are within normal limits. Mild, diffuse, chronic appearing increased lung markings are noted. There is no evidence focal consolidation, pleural effusion or pneumothorax. The visualized skeletal structures are unremarkable. IMPRESSION: Chronic appearing increased lung markings without evidence of acute or active cardiopulmonary disease. Electronically Signed   By: Virgina Norfolk M.D.   On: 08/19/2022 03:26    Review of Systems Blood pressure (!) 156/80, pulse 63, temperature 97.7 F (36.5 C), temperature source Oral, resp. rate 16, height '5\' 2"'$  (1.575 m), weight 60 kg, SpO2 92 %. Physical Exam  Patient is cooperative and in no acute distress Pulls equal round and reactive to light/extraocular movements intact/vision grossly intact Pinna normal without mastoid tenderness or ecchymosis Mandible stable without bony step-offs Patient is  edentulous/normal tongue movement/floor of mouth without swelling/lower lip laceration 2.5 cm Neck is atraumatic/trachea midline/no mass Telecanthus is present indicative of midface fracture involving the nasal bones and ethmoid bones. I reviewed the CT films prior to examining the patient noting bilateral LeFort involving the pterygoid plates.  Based on this, I presume her midface is unstable.  Due to a concern of potential bleeding I did not mobilize the midface given these radiology findings.  CT scan - I reviewed the films and report from the CT maxillofacial scan obtained this evening.  I agree with the report.  The patient has LeFort type I and type II fractures involving midface.  Second, the patient has complex nasal bone fracture extending into the ethmoids (NOE fx), and a right orbital floor fracture.  Involvement of the right nasolacrimal duct is likely . There is a left orbital roof fracture which is insignificant from a bony standpoint but may indicate an intracranial injury.  Assessment/Plan:  Multiple facial fractures (NOE with duct involvement, LeFort 1 and 2, orbital fxs) Lip laceration  This is beyond my scope of practice and is not something that can be fixed here at [facility] transfer to higher level of care for management of the maxillofacial injuries.  I have communicated this to the ER attending and transfer has been arranged and accepted.  Marcina Millard Jr. 08/19/2022, 6:22 AM

## 2022-08-19 NOTE — ED Notes (Signed)
Called [facility] to see if transport could be done sooner, per [facility] they would also have to wait until shift change however it would be a few hours after shift change due to calls that they already have on the board.

## 2022-08-19 NOTE — ED Notes (Signed)
Called [facility] to transport patient to [facility], will be done with morning crew after shift change

## 2022-08-19 NOTE — ED Notes (Signed)
[place] J placed. Pt log rolled with Ralene Bathe MD.

## 2022-08-19 NOTE — Progress Notes (Signed)
   Providing Compassionate, Quality Care - Together    Contacted by Dr. Ralene Bathe of emergency medicine at 3:58 AM regarding this patient. Imaging studies reviewed. Neurosurgery is aware of her acute intracranial hemorrhage and other injuries. No emergency intervention is warranted at present. Maintain hard cervical collar. Plan is to see the patient once her cervical MRI is complete.   Viona Gilmore, DNP, AGNP-C Nurse Practitioner 08/19/2022 4:22 AM  [facility] [place] [address], [place], [place] [address] P: [phone]    F: [phone]

## 2022-08-19 NOTE — ED Notes (Signed)
Mi[place] J applied by 3 RN staff members, c-spine held on application

## 2022-08-19 NOTE — ED Notes (Signed)
Patient transported to CT with TRN [name]

## 2022-08-19 NOTE — ED Notes (Signed)
Patient transported to MRI 

## 2022-08-19 NOTE — ED Notes (Signed)
..Trauma Response Nurse Documentation   Brittany Hobbs is a 61 y.o. female arriving to [facility] ED via EMS  On No antithrombotic. Trauma was activated as a Level 2 by EDP based on the following trauma criteria Discretion of Emergency Department Physician. Trauma team at the bedside on patient arrival.   Patient cleared for CT by Dr. Ralene Bathe. Pt transported to CT with trauma response nurse present to monitor. RN remained with the patient throughout their absence from the department for clinical observation.   GCS 15.  History   Past Medical History:  Diagnosis Date   Acute encephalopathy 01/2022   AKI (acute kidney injury) ([place]) 09/30/2019   Arthritis    Asthma    Colonic diverticular abscess 08/10/2018   Diverticulitis    Hyperlipidemia    Hypertension    Intra-abdominal abscess ([place])    Lupus ([place])    Panic attacks    Pneumonia 09/2020   with covid   PONV (postoperative nausea and vomiting)    Seasonal allergies      Past Surgical History:  Procedure Laterality Date   COLON RESECTION SIGMOID N/A 06/10/2021   Procedure: OPEN SIGMOID COLON RESECTION WITH END COLOSTOMY AND ABDOMINAL WASHOUT;  Surgeon: Dwan Bolt, MD;  Location: [facility];  Service: General;  Laterality: N/A;   [place]  06/10/2021   Procedure: CYSTOSCOPY WITH LEFT STENT PLACEMENT;  Surgeon: Dwan Bolt, MD;  Location: [facility];  Service: General;;   EYE SURGERY     LAPAROTOMY N/A 06/19/2021   Procedure: EXPLORATORY LAPAROTOMY;  Surgeon: Erroll Luna, MD;  Location: [facility];  Service: General;  Laterality: N/A;   LYSIS OF ADHESION N/A 06/01/2022   Procedure: LYSIS OF ADHESION;  Surgeon: Michael Boston, MD;  Location: [facility];  Service: General;  Laterality: N/A;   ORIF CALCANEOUS FRACTURE Right 08/26/2021   Procedure: OPEN REDUCTION INTERNAL FIXATION (ORIF) CALCANEOUS FRACTURE;  Surgeon: Shona Needles, MD;  Location: [facility];  Service: Orthopedics;  Laterality: Right;   P[place]A  FRACTURE SURGERY Left    PROCTOSCOPY N/A 06/01/2022   Procedure: RIGID PROCTOSCOPY;  Surgeon: Michael Boston, MD;  [facility]: [facility];  Service: General;  Laterality: N/A;   scar tissue eye  1983   right   TONSILLECTOMY     TOTAL KNEE ARTHROPLASTY Right 05/20/2021   Procedure: RIGHT TOTAL KNEE ARTHROPLASTY;  Surgeon: Mcarthur Rossetti, MD;  Location: [facility];  Service: Orthopedics;  Laterality: Right;  Needs RNFA   TRIGGER POINT INJECTION Right    Pt. gets injeccions every 8 months   TUBAL LIGATION  1985   WRIST FRACTURE SURGERY Right    XI ROBOTIC ASSISTED COLOSTOMY TAKEDOWN N/A 06/01/2022   Procedure: XI ROBOTIC ASSISTED COLOSTOMY TAKEDOWN;  Surgeon: Michael Boston, MD;  Location: [facility];  Service: General;  Laterality: N/A;       Initial Focused Assessment (If applicable, or please see trauma documentation): See Trauma Charting  CT's Completed:   CT Head, CT Maxillofacial, and CT C-Spine  CAP w/o contrast due to allergy Interventions:    Plan for disposition:  Transfer    Consults completed:  Neurosurgeon Pool/Bergman at [facility] T[facility] [place] at [facility] at [phone].  Event Summary: *Late activation on arrival-EDP discretion Pt arrived via [facility] from home of pts friend Brittany Hobbs. Pt reports she tripped and fell down 8 carpeted steps. A & O, GCS 15, pt reports she did have etoh last evening. Significant swelling and bruising to R eye, forehead,  dried blood noted in nares. Small laceration to chin. Miami J in place, pt log rolled by ED staff. Pt c/o extreme pain and burning to bilat hand along with limited movement. Pt reports this is a new problem. Tdap given by primary RN. Pt transported to CT with TRN on CCM, spinal precautions maintained.  Upon return to room 16, pts friend Brittany Hobbs at bedside. Updated on POC.  NS, ENT and TMD consulted. Pt to have MRI here, likely transfer due to facial fx's.  CT has pushed images to [facility]   Bedside handoff with ED RN Brittany Hobbs.    Brittany Hobbs, [place]  Trauma Response RN  Please call TRN at [phone] for further assistance.

## 2022-08-19 NOTE — Progress Notes (Signed)
I was called 15 minutes ago by Dr. Ralene Bathe for a facial trauma consult on a patient with multiple facial fractures including bilateral LeFort I fractures, a complex NOE fracture, and a frontal sinus fracture.  Patient is in the MRI scanner currently and not available for an exam.  I am waiting for her to return.  Based on her scan and CT report, this patient needs surgery to address an unstable midface.  This case is beyond my scope of expertise.  She needs to be transferred for this care.  Dr. Ralene Bathe has asked that I place a formal consult on the chart.

## 2022-08-19 NOTE — ED Notes (Signed)
Patient transported to CT by [name], TRN

## 2022-08-19 NOTE — ED Provider Notes (Signed)
[facility] EMERGENCY DEPARTMENT Provider Note   CSN: [id number] Arrival date & time: 08/19/22  0255     History  Chief Complaint  Patient presents with   Brittany Hobbs is a 61 y.o. female.  The history is provided by the patient, the EMS personnel and medical records.  Fall  Brittany Hobbs is a 61 y.o. female who presents to the Emergency Department complaining of fall.  She presents to the emergency department by EMS for evaluation following a fall.  She states that she was walking down the stairs and missed a step and fell down 8 stairs.  She complains of severe pain to her face and head.  She also complains of severe burning to bilateral hands.  She has a history of lupus.  She was on prednisone previously but not currently.  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  She did have 1 glass of wine tonight but does not routinely drink.  No drug use.     Home Medications Prior to Admission medications   Medication Sig Start Date End Date Taking? Authorizing Provider  albuterol (VENTOLIN HFA) 108 (90 Base) MCG/ACT inhaler TAKE 2 PUFFS BY MOUTH EVERY 4 HOURS AS NEEDED FOR WHEEZE Patient taking differently: Inhale 2 puffs into the lungs See admin instructions. 2 puff by mouth 4-5 times a day 02/07/22     amLODipine (NORVASC) 10 MG tablet Take 1 tablet (10 mg total) by mouth daily. 02/06/22   Thurnell Lose, MD  aspirin EC 81 MG tablet Take 81 mg by mouth every morning. Swallow whole.    [provider]  Aspirin-Caffeine (BC FAST PAIN RELIEF ARTHRITIS PO) Take 1 Dose by mouth as needed (pain).    [provider]  carboxymethylcellulose (REFRESH PLUS) 0.5 % SOLN Place 1 drop into both eyes 3 (three) times daily as needed (dry eyes).    [provider]  escitalopram (LEXAPRO) 20 MG tablet Take 1 tabllet by mouth daily. 02/07/22     gabapentin (NEURONTIN) 100 MG capsule Take 1 capsule (100 mg total) by mouth 3  (three) times daily. 02/06/22   Thurnell Lose, MD  gabapentin (NEURONTIN) 400 MG capsule Take 800 mg by mouth 3 (three) times daily.    [provider]  LORazepam (ATIVAN) 0.5 MG tablet Take 0.5 mg by mouth at bedtime as needed for anxiety. 09/03/19   [provider]  polyethylene glycol (MIRALAX / GLYCOLAX) 17 g packet Take 17 g by mouth daily as needed for mild constipation. Patient taking differently: Take 17 g by mouth every morning. 09/05/21   Winferd Humphrey, PA-C  predniSONE (DELTASONE) 10 MG tablet Take 10 mg by mouth daily with breakfast. For lupus    [provider]  traMADol (ULTRAM) 50 MG tablet Take 1-2 tablets (50-100 mg total) by mouth every 6 (six) hours as needed for moderate pain. 06/05/22   Michael Boston, MD      Allergies    Contrast media [iodinated contrast media], Iodine, Betadine [povidone iodine], Methocarbamol, Sulfa antibiotics, and Tomato flavor [flavoring agent]    Review of Systems   Review of Systems  All other systems reviewed and are negative.   Physical Exam Updated Vital Signs BP (!) 156/80   Pulse 63   Temp 97.7 F (36.5 C) (Oral)   Resp 16   Ht '5\' 2"'$  (1.575 m)   Wt 60 kg   SpO2 92%   BMI 24.19 kg/m  Physical Exam Vitals and nursing note reviewed.  Constitutional:      Appearance: She is well-developed.  HENT:     Head: Normocephalic.     Comments: Hematoma to the right temple.  Pupils equal round and reactive, EOMI.  There is a laceration to the lower inner lip Cardiovascular:     Rate and Rhythm: Normal rate and regular rhythm.     Heart sounds: No murmur heard. Pulmonary:     Effort: Pulmonary effort is normal. No respiratory distress.     Breath sounds: Normal breath sounds.  Abdominal:     Palpations: Abdomen is soft.     Tenderness: There is no abdominal tenderness. There is no guarding or rebound.  Musculoskeletal:     Comments: Severe tenderness to palpation with light touch to bilateral hands  without obvious deformity.  Patient is unable to range the digits bilaterally.  Skin:    General: Skin is warm and dry.  Neurological:     Mental Status: She is alert and oriented to person, place, and time.     Comments: Unable to elevate the arms bilaterally.  Wiggles toes bilaterally.  GCS 3-5-6  Psychiatric:        Behavior: Behavior normal.     ED Results / Procedures / Treatments   Labs (all labs ordered are listed, but only abnormal results are displayed) Labs Reviewed  COMPREHENSIVE METABOLIC PANEL - Abnormal; Notable for the following components:      Result Value   Potassium 3.3 (*)    Glucose, Bld 147 (*)    Total Protein 6.1 (*)    Albumin 3.4 (*)    All other components within normal limits  CBC WITH DIFFERENTIAL/PLATELET - Abnormal; Notable for the following components:   WBC 13.2 (*)    Hemoglobin 11.9 (*)    MCV 77.6 (*)    MCH 25.2 (*)    RDW 19.3 (*)    Neutro Abs 9.7 (*)    All other components within normal limits  URINALYSIS, ROUTINE W REFLEX MICROSCOPIC  ETHANOL  PROTIME-INR  TYPE AND SCREEN    EKG EKG Interpretation  Date/Time:  Saturday August 19 2022 03:18:29 EST Ventricular Rate:  76 PR Interval:  146 QRS Duration: 102 QT Interval:  422 QTC Calculation: 475 R Axis:   90 Text Interpretation: Sinus rhythm Probable left atrial enlargement Borderline right axis deviation Anteroseptal infarct, old Minimal ST depression, inferior leads Confirmed by Quintella Reichert [phone]) on 08/19/2022 3:52:12 AM  Radiology CT Head Wo Contrast  Result Date: 08/19/2022 CLINICAL DATA:  Fall EXAM: CT HEAD WITHOUT CONTRAST CT MAXILLOFACIAL WITHOUT CONTRAST CT CERVICAL SPINE WITHOUT CONTRAST TECHNIQUE: Multidetector CT imaging of the head, cervical spine, and maxillofacial structures were performed using the standard protocol without intravenous contrast. Multiplanar CT image reconstructions of the cervical spine and maxillofacial structures were also generated.  RADIATION DOSE REDUCTION: This exam was performed according to the departmental dose-optimization program which includes automated exposure control, adjustment of the mA and/or kV according to patient size and/or use of iterative reconstruction technique. COMPARISON:  None Available. FINDINGS: CT HEAD FINDINGS Brain: There is multifocal acute intracranial hemorrhage. There is a large intraparenchymal hematoma in the anterior right temporal lobe measuring 3.3 x 1.7 cm. There is subarachnoid blood in the right sylvian fissure and mixed subdural/subarachnoid blood at the left posterior falx. Small amount of subarachnoid blood at the right frontal pole. No midline shift or other mass effect. Mild edema surrounding the right temporal hemorrhage. Vascular:  No abnormal hyperdensity of the major intracranial arteries or dural venous sinuses. No intracranial atherosclerosis. Skull: Fracture of the left orbital roof with trace pneumocephalus. There is a fracture of the right frontal bone traversing the anterior and posterior tables of the right frontal sinus. CT MAXILLOFACIAL FINDINGS Osseous: There is a left LeFort type 1 transfacial complex fracture involving the left pterygoid and the anterior, posterior and medial walls of the left maxillary sinus. There are right LeFort type 1 and 2 transfacial complex fractures, involving the pterygoid, all walls of the right maxillary sinus in the inferior and medial walls of the right orbit. There is a nasoorbitoethmoid complex fracture. complex fracture involving both nasal bones, the frontal process of the left maxilla, the right nasolacrimal duct and the right medial orbital wall. There is no mandibular fracture. Orbits: On the right, there are fractures of the orbital floor and lamina papyracea. There is a small superior extraconal hematoma. Small hematoma inferior to the inferior rectus muscle. Fracture traverses the inferior orbital foramen. No extraocular muscle herniation. On  the left, there is a fracture of the superior orbital rim with a small superior extraconal hematoma. Sinuses: Widespread hemosinus. Soft tissues: Severe left facial swelling with subcutaneous emphysema due to left maxillary sinus fractures. CT CERVICAL SPINE FINDINGS Alignment: No static subluxation. Facets are aligned. Occipital condyles and the lateral masses of C1-C2 are aligned. Skull base and vertebrae: There is a minimally displaced fracture at the superior articular process of the left C3 facet (series 7, image 39). No other cervical spine fracture. Soft tissues and spinal canal: No prevertebral fluid or swelling. No visible canal hematoma. Disc levels: No advanced spinal canal or neural foraminal stenosis. Upper chest: No pneumothorax, pulmonary nodule or pleural effusion. Other: Normal visualized paraspinal cervical soft tissues. IMPRESSION: 1. Multifocal acute intracranial hemorrhage with large intraparenchymal hematoma in the anterior right temporal lobe measuring 3.3 x 1.7 cm. 2. Subarachnoid blood in the right Sylvian fissure and mixed subdural/subarachnoid blood at the left posterior falx. 3. Right frontal bone fracture traversing the right frontal sinus. 4. Bilateral LeFort transfacial complex fractures: right type 1 and 2, left type 1. 5. Nasoorbitoethmoid complex fracture. 6. Multiple orbital fractures with small extraconal hematomas. Left orbital roof fracture with trace adjacent pneumocephalus. 7. Minimally displaced chip fracture of the left C3 facet superior articular process (AO Spine type F1). Critical Value/emergent results were called by telephone at the time of interpretation on 08/19/2022 at 3:55 am to provider [facility] , who verbally acknowledged these results. Electronically Signed   By: Ulyses Jarred M.D.   On: 08/19/2022 04:19   CT Cervical Spine Wo Contrast  Result Date: 08/19/2022 CLINICAL DATA:  Fall EXAM: CT HEAD WITHOUT CONTRAST CT MAXILLOFACIAL WITHOUT CONTRAST CT  CERVICAL SPINE WITHOUT CONTRAST TECHNIQUE: Multidetector CT imaging of the head, cervical spine, and maxillofacial structures were performed using the standard protocol without intravenous contrast. Multiplanar CT image reconstructions of the cervical spine and maxillofacial structures were also generated. RADIATION DOSE REDUCTION: This exam was performed according to the departmental dose-optimization program which includes automated exposure control, adjustment of the mA and/or kV according to patient size and/or use of iterative reconstruction technique. COMPARISON:  None Available. FINDINGS: CT HEAD FINDINGS Brain: There is multifocal acute intracranial hemorrhage. There is a large intraparenchymal hematoma in the anterior right temporal lobe measuring 3.3 x 1.7 cm. There is subarachnoid blood in the right sylvian fissure and mixed subdural/subarachnoid blood at the left posterior falx. Small amount of subarachnoid blood  at the right frontal pole. No midline shift or other mass effect. Mild edema surrounding the right temporal hemorrhage. Vascular: No abnormal hyperdensity of the major intracranial arteries or dural venous sinuses. No intracranial atherosclerosis. Skull: Fracture of the left orbital roof with trace pneumocephalus. There is a fracture of the right frontal bone traversing the anterior and posterior tables of the right frontal sinus. CT MAXILLOFACIAL FINDINGS Osseous: There is a left LeFort type 1 transfacial complex fracture involving the left pterygoid and the anterior, posterior and medial walls of the left maxillary sinus. There are right LeFort type 1 and 2 transfacial complex fractures, involving the pterygoid, all walls of the right maxillary sinus in the inferior and medial walls of the right orbit. There is a nasoorbitoethmoid complex fracture. complex fracture involving both nasal bones, the frontal process of the left maxilla, the right nasolacrimal duct and the right medial orbital  wall. There is no mandibular fracture. Orbits: On the right, there are fractures of the orbital floor and lamina papyracea. There is a small superior extraconal hematoma. Small hematoma inferior to the inferior rectus muscle. Fracture traverses the inferior orbital foramen. No extraocular muscle herniation. On the left, there is a fracture of the superior orbital rim with a small superior extraconal hematoma. Sinuses: Widespread hemosinus. Soft tissues: Severe left facial swelling with subcutaneous emphysema due to left maxillary sinus fractures. CT CERVICAL SPINE FINDINGS Alignment: No static subluxation. Facets are aligned. Occipital condyles and the lateral masses of C1-C2 are aligned. Skull base and vertebrae: There is a minimally displaced fracture at the superior articular process of the left C3 facet (series 7, image 39). No other cervical spine fracture. Soft tissues and spinal canal: No prevertebral fluid or swelling. No visible canal hematoma. Disc levels: No advanced spinal canal or neural foraminal stenosis. Upper chest: No pneumothorax, pulmonary nodule or pleural effusion. Other: Normal visualized paraspinal cervical soft tissues. IMPRESSION: 1. Multifocal acute intracranial hemorrhage with large intraparenchymal hematoma in the anterior right temporal lobe measuring 3.3 x 1.7 cm. 2. Subarachnoid blood in the right Sylvian fissure and mixed subdural/subarachnoid blood at the left posterior falx. 3. Right frontal bone fracture traversing the right frontal sinus. 4. Bilateral LeFort transfacial complex fractures: right type 1 and 2, left type 1. 5. Nasoorbitoethmoid complex fracture. 6. Multiple orbital fractures with small extraconal hematomas. Left orbital roof fracture with trace adjacent pneumocephalus. 7. Minimally displaced chip fracture of the left C3 facet superior articular process (AO Spine type F1). Critical Value/emergent results were called by telephone at the time of interpretation on  08/19/2022 at 3:55 am to provider [facility] , who verbally acknowledged these results. Electronically Signed   By: Ulyses Jarred M.D.   On: 08/19/2022 04:19   CT Maxillofacial WO CM  Result Date: 08/19/2022 CLINICAL DATA:  Fall EXAM: CT HEAD WITHOUT CONTRAST CT MAXILLOFACIAL WITHOUT CONTRAST CT CERVICAL SPINE WITHOUT CONTRAST TECHNIQUE: Multidetector CT imaging of the head, cervical spine, and maxillofacial structures were performed using the standard protocol without intravenous contrast. Multiplanar CT image reconstructions of the cervical spine and maxillofacial structures were also generated. RADIATION DOSE REDUCTION: This exam was performed according to the departmental dose-optimization program which includes automated exposure control, adjustment of the mA and/or kV according to patient size and/or use of iterative reconstruction technique. COMPARISON:  None Available. FINDINGS: CT HEAD FINDINGS Brain: There is multifocal acute intracranial hemorrhage. There is a large intraparenchymal hematoma in the anterior right temporal lobe measuring 3.3 x 1.7 cm. There is subarachnoid blood  in the right sylvian fissure and mixed subdural/subarachnoid blood at the left posterior falx. Small amount of subarachnoid blood at the right frontal pole. No midline shift or other mass effect. Mild edema surrounding the right temporal hemorrhage. Vascular: No abnormal hyperdensity of the major intracranial arteries or dural venous sinuses. No intracranial atherosclerosis. Skull: Fracture of the left orbital roof with trace pneumocephalus. There is a fracture of the right frontal bone traversing the anterior and posterior tables of the right frontal sinus. CT MAXILLOFACIAL FINDINGS Osseous: There is a left LeFort type 1 transfacial complex fracture involving the left pterygoid and the anterior, posterior and medial walls of the left maxillary sinus. There are right LeFort type 1 and 2 transfacial complex fractures,  involving the pterygoid, all walls of the right maxillary sinus in the inferior and medial walls of the right orbit. There is a nasoorbitoethmoid complex fracture. complex fracture involving both nasal bones, the frontal process of the left maxilla, the right nasolacrimal duct and the right medial orbital wall. There is no mandibular fracture. Orbits: On the right, there are fractures of the orbital floor and lamina papyracea. There is a small superior extraconal hematoma. Small hematoma inferior to the inferior rectus muscle. Fracture traverses the inferior orbital foramen. No extraocular muscle herniation. On the left, there is a fracture of the superior orbital rim with a small superior extraconal hematoma. Sinuses: Widespread hemosinus. Soft tissues: Severe left facial swelling with subcutaneous emphysema due to left maxillary sinus fractures. CT CERVICAL SPINE FINDINGS Alignment: No static subluxation. Facets are aligned. Occipital condyles and the lateral masses of C1-C2 are aligned. Skull base and vertebrae: There is a minimally displaced fracture at the superior articular process of the left C3 facet (series 7, image 39). No other cervical spine fracture. Soft tissues and spinal canal: No prevertebral fluid or swelling. No visible canal hematoma. Disc levels: No advanced spinal canal or neural foraminal stenosis. Upper chest: No pneumothorax, pulmonary nodule or pleural effusion. Other: Normal visualized paraspinal cervical soft tissues. IMPRESSION: 1. Multifocal acute intracranial hemorrhage with large intraparenchymal hematoma in the anterior right temporal lobe measuring 3.3 x 1.7 cm. 2. Subarachnoid blood in the right Sylvian fissure and mixed subdural/subarachnoid blood at the left posterior falx. 3. Right frontal bone fracture traversing the right frontal sinus. 4. Bilateral LeFort transfacial complex fractures: right type 1 and 2, left type 1. 5. Nasoorbitoethmoid complex fracture. 6. Multiple orbital  fractures with small extraconal hematomas. Left orbital roof fracture with trace adjacent pneumocephalus. 7. Minimally displaced chip fracture of the left C3 facet superior articular process (AO Spine type F1). Critical Value/emergent results were called by telephone at the time of interpretation on 08/19/2022 at 3:55 am to provider [facility] , who verbally acknowledged these results. Electronically Signed   By: Ulyses Jarred M.D.   On: 08/19/2022 04:19   CT CHEST ABDOMEN PELVIS WO CONTRAST  Result Date: 08/19/2022 CLINICAL DATA:  Polytrauma, blunt.  Fall EXAM: CT CHEST, ABDOMEN AND PELVIS WITHOUT CONTRAST TECHNIQUE: Multidetector CT imaging of the chest, abdomen and pelvis was performed following the standard protocol without IV contrast. RADIATION DOSE REDUCTION: This exam was performed according to the departmental dose-optimization program which includes automated exposure control, adjustment of the mA and/or kV according to patient size and/or use of iterative reconstruction technique. COMPARISON:  08/25/2021 FINDINGS: CT CHEST FINDINGS Cardiovascular: Heart is normal size. Aorta is normal caliber. Scattered coronary artery and aortic calcifications. Mediastinum/Nodes: No mediastinal, hilar, or axillary adenopathy. Trachea and esophagus are unremarkable.  Thyroid unremarkable. Lungs/Pleura: Peripheral interstitial thickening/honeycombing in the lungs compatible with fibrosis. No confluent airspace opacities or effusions. No pneumothorax. Musculoskeletal: Chest wall soft tissues are unremarkable. No acute bony abnormality. Compression fractures involving the T6, T7 and T12 vertebral bodies are stable since prior study. CT ABDOMEN PELVIS FINDINGS Hepatobiliary: Gallbladder filled with gallstones. No focal hepatic abnormality or evidence of a hepatic injury. Pancreas: No focal abnormality or ductal dilatation. Spleen: No focal abnormality. Normal size. No evidence of splenic injury. Adrenals/Urinary Tract:  No adrenal abnormality. No focal renal abnormality. No stones or hydronephrosis. Urinary bladder is unremarkable. Stomach/Bowel: Stomach, large and small bowel grossly unremarkable. Vascular/Lymphatic: Aortic atherosclerosis. No evidence of aneurysm or adenopathy. Reproductive: Uterus and adnexa unremarkable.  No mass. Other: No free fluid or free air. Musculoskeletal: No acute bony abnormality. Compression fractures involving the superior endplates of L2 through L4 again noted, unchanged. IMPRESSION: No acute findings or evidence of significant traumatic injury in the chest, abdomen or pelvis. Fibrosis peripherally within the lungs. Aortic atherosclerosis. Cholelithiasis. Electronically Signed   By: Rolm Baptise M.D.   On: 08/19/2022 03:48   DG Pelvis Portable  Result Date: 08/19/2022 CLINICAL DATA:  Status post fall. EXAM: PORTABLE PELVIS 1-2 VIEWS COMPARISON:  August 25, 2021 FINDINGS: There is no evidence of pelvic fracture or diastasis. No pelvic bone lesions are seen. IMPRESSION: Negative. Electronically Signed   By: Virgina Norfolk M.D.   On: 08/19/2022 03:27   DG Chest Port 1 View  Result Date: 08/19/2022 CLINICAL DATA:  Status post fall. EXAM: PORTABLE CHEST 1 VIEW COMPARISON:  Feb 03, 2022 FINDINGS: The heart size and mediastinal contours are within normal limits. Mild, diffuse, chronic appearing increased lung markings are noted. There is no evidence focal consolidation, pleural effusion or pneumothorax. The visualized skeletal structures are unremarkable. IMPRESSION: Chronic appearing increased lung markings without evidence of acute or active cardiopulmonary disease. Electronically Signed   By: Virgina Norfolk M.D.   On: 08/19/2022 03:26    Procedures Procedures   CRITICAL CARE Performed by: Quintella Reichert   Total critical care time: 60 minutes  Critical care time was exclusive of separately billable procedures and treating other patients.  Critical care was necessary to treat  or prevent imminent or life-threatening deterioration.  Critical care was time spent personally by me on the following activities: development of treatment plan with patient and/or surrogate as well as nursing, discussions with consultants, evaluation of patient's response to treatment, examination of patient, obtaining history from patient or surrogate, ordering and performing treatments and interventions, ordering and review of laboratory studies, ordering and review of radiographic studies, pulse oximetry and re-evaluation of patient's condition.  Medications Ordered in ED Medications  fentaNYL (SUBLIMAZE) injection 50 mcg (50 mcg Intravenous Given 08/19/22 0319)  labetalol (NORMODYNE) injection 10 mg (10 mg Intravenous Given 08/19/22 0358)  labetalol (NORMODYNE) injection 20 mg (20 mg Intravenous Given 08/19/22 6283)    ED Course/ Medical Decision Making/ A&P                           Medical Decision Making Amount and/or Complexity of Data Reviewed Labs: ordered. Radiology: ordered.  Risk Prescription drug management.   Patient here for evaluation following a fall down 8 stairs.  She has evidence of head trauma on examination.  She has pain out of proportion to bilateral hands with no clear deformities but is unable to fully palpate or open hands secondary to patient's discomfort.  She was  placed in a [place] J collar at time of ED evaluation and a trauma alert was activated.  CT head demonstrates multiple areas of intracranial hemorrhage as well as multiple unstable facial fractures.  Discussed with neurosurgery team-no seizure medications indicated at this time, recommends blood pressure control to avoid severe hypertension.  Discussed with Dr. Marcelline Deist with ENT-will see the patient in consult, recommends transfer to tertiary care facility due to complicated and unstable facial fractures.  Discussed with patient findings of studies and she is in agreement with treatment plan.  Discussed with  Dr. Bobbye Morton with trauma surgery.  Patient did have hypertension, required 2 rounds of labetalol for blood pressure control.  Attempted to obtain MRI cervical spine due to concern for possible cord injury, images a significantly limited due to motion artifact.  We will keep the patient in a cervical collar at this time.  Patient did develop recurrent hypertension requiring Cleviprex for blood pressure control.. Discussed with Dr. Lenn Sink at [facility] with trauma surgery-accepts the patient in transfer.       Final Clinical Impression(s) / ED Diagnoses Final diagnoses:  Fall, initial encounter  Traumatic intracerebral hemorrhage with unknown loss of consciousness status, unspecified laterality, initial encounter ([place])  Multiple open fractures of facial bones, initial encounter [facility])    Rx / DC Orders ED Discharge Orders     None         Quintella Reichert, MD 08/19/22 [phone]

## 2022-08-19 NOTE — ED Triage Notes (Addendum)
Pt arrived via [facility] for fall from home. Pt was walking when she tripped and fell down 8 stairs (carpet stairs/flooring). When EMS arrived pt was sitting upright in chair, blood noted in nares (bleeding ceased by time of EMS arrival), laceration noted to inside of lip, large hematoma noted to right orbital socket, large hematoma to right forehead. Pt is A&Ox4, GCS 15, denies loss of consciousness. Patient is reporting significant pain/burning in bilateral hands and pain in head. Pt denies neck, back, chest pain. Slurred speech is noted.  PTA EMS Vitals  BP 152/88 HR 88 SPO2 92% RR 18 CBG 143

## 2022-08-19 NOTE — ED Notes (Signed)
[facility] for transfer, Consult paged to Dr. Harrold Donath, General Surgery, to callback to Dr. Ralene Bathe.
